# Patient Record
Sex: Female | Born: 1955
Health system: Southern US, Community
[De-identification: ages and names within clinical notes are randomized; demographics above are authoritative.]

## PROBLEM LIST (undated history)

## (undated) DIAGNOSIS — E785 Hyperlipidemia, unspecified: Secondary | ICD-10-CM

## (undated) DIAGNOSIS — M109 Gout, unspecified: Secondary | ICD-10-CM

## (undated) DIAGNOSIS — R05 Cough: Secondary | ICD-10-CM

## (undated) DIAGNOSIS — M1990 Unspecified osteoarthritis, unspecified site: Secondary | ICD-10-CM

## (undated) DIAGNOSIS — I350 Nonrheumatic aortic (valve) stenosis: Secondary | ICD-10-CM

## (undated) DIAGNOSIS — I10 Essential (primary) hypertension: Secondary | ICD-10-CM

## (undated) DIAGNOSIS — K219 Gastro-esophageal reflux disease without esophagitis: Secondary | ICD-10-CM

## (undated) DIAGNOSIS — E119 Type 2 diabetes mellitus without complications: Secondary | ICD-10-CM

## (undated) DIAGNOSIS — R059 Cough, unspecified: Secondary | ICD-10-CM

## (undated) DIAGNOSIS — I2510 Atherosclerotic heart disease of native coronary artery without angina pectoris: Secondary | ICD-10-CM

## (undated) DIAGNOSIS — M549 Dorsalgia, unspecified: Secondary | ICD-10-CM

## (undated) DIAGNOSIS — I509 Heart failure, unspecified: Secondary | ICD-10-CM

## (undated) HISTORY — PX: TOTAL HIP ARTHROPLASTY: SHX124

## (undated) HISTORY — PX: TONSILLECTOMY: SUR1361

## (undated) HISTORY — PX: CHOLECYSTECTOMY: SHX55

## (undated) HISTORY — PX: HERNIA REPAIR: SHX51

## (undated) HISTORY — PX: ABDOMINAL HYSTERECTOMY: SHX81

## (undated) HISTORY — PX: OTHER SURGICAL HISTORY: SHX169

## (undated) HISTORY — PX: APPENDECTOMY: SHX54

---

## 2001-04-05 ENCOUNTER — Ambulatory Visit (HOSPITAL_COMMUNITY): Admission: RE | Admit: 2001-04-05 | Discharge: 2001-04-05 | Payer: Self-pay | Admitting: *Deleted

## 2001-04-05 ENCOUNTER — Encounter: Payer: Self-pay | Admitting: *Deleted

## 2001-05-04 ENCOUNTER — Encounter: Payer: Self-pay | Admitting: *Deleted

## 2001-05-04 ENCOUNTER — Ambulatory Visit (HOSPITAL_COMMUNITY): Admission: RE | Admit: 2001-05-04 | Discharge: 2001-05-04 | Payer: Self-pay | Admitting: *Deleted

## 2001-05-05 ENCOUNTER — Ambulatory Visit (HOSPITAL_COMMUNITY): Admission: RE | Admit: 2001-05-05 | Discharge: 2001-05-05 | Payer: Self-pay | Admitting: *Deleted

## 2001-05-05 ENCOUNTER — Encounter: Payer: Self-pay | Admitting: *Deleted

## 2001-08-04 ENCOUNTER — Other Ambulatory Visit: Admission: RE | Admit: 2001-08-04 | Discharge: 2001-08-04 | Payer: Self-pay | Admitting: Obstetrics and Gynecology

## 2001-10-26 ENCOUNTER — Encounter (INDEPENDENT_AMBULATORY_CARE_PROVIDER_SITE_OTHER): Payer: Self-pay | Admitting: Specialist

## 2001-10-26 ENCOUNTER — Ambulatory Visit (HOSPITAL_COMMUNITY): Admission: RE | Admit: 2001-10-26 | Discharge: 2001-10-26 | Payer: Self-pay | Admitting: Gastroenterology

## 2007-03-15 ENCOUNTER — Ambulatory Visit (HOSPITAL_COMMUNITY): Admission: RE | Admit: 2007-03-15 | Discharge: 2007-03-15 | Payer: Self-pay | Admitting: Family Medicine

## 2007-03-30 ENCOUNTER — Encounter: Admission: RE | Admit: 2007-03-30 | Discharge: 2007-03-30 | Payer: Self-pay | Admitting: Sports Medicine

## 2007-04-05 ENCOUNTER — Encounter (HOSPITAL_COMMUNITY): Admission: RE | Admit: 2007-04-05 | Discharge: 2007-05-05 | Payer: Self-pay | Admitting: Sports Medicine

## 2007-05-25 ENCOUNTER — Ambulatory Visit (HOSPITAL_COMMUNITY): Admission: RE | Admit: 2007-05-25 | Discharge: 2007-05-25 | Payer: Self-pay | Admitting: Family Medicine

## 2007-12-22 HISTORY — PX: JOINT REPLACEMENT: SHX530

## 2008-04-17 ENCOUNTER — Inpatient Hospital Stay (HOSPITAL_COMMUNITY): Admission: RE | Admit: 2008-04-17 | Discharge: 2008-04-20 | Payer: Self-pay | Admitting: Orthopaedic Surgery

## 2011-05-05 NOTE — Op Note (Signed)
NAME:  Karina Green, Karina Green NO.:  [id number]   MEDICAL RECORD NO.:  [id number]          PATIENT TYPE:  INP   LOCATION:  5001                         FACILITY:  [facility]   PHYSICIAN:  Claude Manges. Whitfield, M.D.DATE OF BIRTH:  11/24/1956   DATE OF PROCEDURE:  04/17/2008  DATE OF DISCHARGE:                               OPERATIVE REPORT   PREOPERATIVE DIAGNOSIS:  Avascular necrosis, right femoral head with  osteoarthritis, right hip.   POSTOPERATIVE DIAGNOSIS:  Avascular necrosis, right femoral head with  osteoarthritis, right hip.   PROCEDURE:  Right total hip replacement.   SURGEON:  Claude Manges. Cleophas Dunker, MD   ASSISTANT:  1. Lenard Galloway. Chaney Malling, MD  2. Legrand Pitts Duffy, PA-C   ANESTHESIA:  General.   COMPLICATIONS:  None.   COMPONENTS:  DePuy AML, small stature of 13.5-mm femoral component, and  a 36-mm outer diameter hip ball with a +5 neck length.  The 100 series  54-mm outer diameter metallic acetabular cup with a +4 polyethylene  liner with a 10-degree posterior lip.   PROCEDURE:  The patient was met in the holding area preoperatively, any  questions were answered.  The right lower extremity was marked as the  appropriate operative extremity.   The patient was transferred to room 4 in the operating room.  She was  placed under general orotracheal anesthesia and placed in the lateral  decubitus position with the right side up after nursing staff inserted a  Foley catheter.   Urine was clear.   The patient was secured to the operating table with the innominate hip  system.  The right hip was then prepped with Betadine scrub and then  DuraPrep from iliac crest to below the knee.  Sterile draping was  performed.   A routine southern incision was utilized via sharp dissection carried  down through subcutaneous tissue.  There was abundant adipose tissue  which made the procedure technically difficult throughout.  Adipose  tissue was then incised with the Bovie  to limit plane to the level of  the iliotibial band.  Self-retaining retractors were inserted at each  level of incision.  The iliotibial band was then identified and then  incised along the incision.  The short external rotators were  identified, tendinous structures were tagged with 0 Ethibond suture and  they were then incised in their attachment to the posterior aspect of  the greater trochanter.  The capsule was then identified and then  incised along the femoral neck and head.  There was minimal effusion and  there was beefy red synovitis.   The head was then easily dislocated posteriorly and then exposed.  Using  a calcar cord an osteotomy was made about a fingerbreadth proximal to  the lesser trochanter.   The capsule was cleared for better visualization.  The starter hole was  then made at the piriformis fossa.  Reaming was performed sequentially  to 13-mm to accept a 13.5-mm component.   Rasping was performed sequentially using a 10, 10.5, 12, and then 13.5-  mm rasp.  Calcar reamer was used to obtain  the appropriate angle in the  calcar.   Retractor was then placed about the acetabulum.  The labrum was sharply  excised.  We had difficulty visualizing the acetabulum at first because  of the patient's size, but we were able to clear and retract the soft  tissue for better visualization.  Reaming was performed sequentially to  53 mm to accept a 54 mm odd diameter component.   We then trialed a 52-mm component and felt that we had excellent rim fit  that would completely seat, so we accordingly utilized a 54-mm outer  diameter 100 series acetabulum.  This was impacted followed by the trial  polyethylene liner, the 13.5-mm rasp and then several neck and head  sizes, and we felt that the acetabulum was little bit too vertical as we  still had some instability.  Accordingly the trial components were  removed.  We repositioned the acetabulum in more of a flexion and less   vertical position.  It was perfectly stable and completely seated.  We  then reinserted the trial components and we had a perfect stability with  a +5 neck length and a 36-mm hip ball and we felt leg lengths were  perfectly symmetrical and again we had a stable construct.   The trial components were removed.  The joint was copiously irrigated  with saline solution.  The apex eliminator was inserted followed by a +4  marathon polyethylene liner with a 10-degree posterior lip.  Wound was  again irrigated.  The 13.5-mm femoral component was then impacted on the  calcar.  We had a very nice fit and was perfectly stable.  We again  trialed a +5 neck with perfect stability.  We accordingly removed the +5  trial ball, we cleaned the Morse taper neck and then applied a 36-mm hip  ball with a +5 neck length.  The acetabulum was inspected and clear soft  tissue was retracted.  The head was then reduced in the acetabulum and  again through a full range of motion and perfectly stable and we felt  leg lengths were symmetrical.  The wound was again irrigated with saline  solution.  The capsule was closed anatomically with #1 Ethibond.  Short  external rotators were closed with similar material, iliotibial band was  closed with 0 Ethibond and #1 Vicryl.  The adipose tissue and  subcutaneous were closed in several layers of 0 and 2-0 Vicryl and skin  closed with skin clips.  Sterile bulky dressing was applied.  The  patient was then awoken and returned to the post anesthesia recovery  room in satisfactory condition.      Claude Manges. Cleophas Dunker, M.D.  Electronically Signed     PWW/MEDQ  D:  04/17/2008  T:  04/18/2008  Job:  [id number]

## 2011-05-08 NOTE — Discharge Summary (Signed)
NAME:  Karina, Green NO.:  [id number]   MEDICAL RECORD NO.:  [id number]          PATIENT TYPE:  INP   LOCATION:  5001                         FACILITY:  [facility]   PHYSICIAN:  Claude Manges. Whitfield, M.D.DATE OF BIRTH:  [birth year]   DATE OF ADMISSION:  04/17/2008  DATE OF DISCHARGE:  04/20/2008                               DISCHARGE SUMMARY   ADMISSION DIAGNOSES:  1. Avascular necrosis, right hip.  2. Hypertension.  3. Hypercholesterolemia.  4. Obesity.   DISCHARGE DIAGNOSES:  1. End-stage osteoarthritis of the right hip, status post right total      hip arthroplasty.  2. Acute blood loss anemia secondary to surgery.  3. Hypertension.  4. Hypercholesterolemia.  5. Obesity.   SURGICAL PROCEDURES:  On April 17, 2008, Karina Green underwent a right  total hip arthroplasty by Dr. Claude Manges. Whitfield, assisted by Dr. Rinaldo Ratel and Arnoldo Morale, PA-C.  She had a Pinnacle 100 series  acetabular cup size 54 mm placed with an apex hole eliminator and a  Pinnacle Marathon acetabular liner +410 degrees angle 36 mm inner  diameter and 54 mm outer diameter.  An AML small stature femoral stem  size 13.5, 155 mm length 43 mm offset and an articulate metal-on-metal  femoral head 36 mm, +5 neck length, 12/14 cone.   COMPLICATIONS:  None.   CONSULTANTS:  1. Pharmacy consult for Coumadin therapy, April 17, 2008.  2. Case management physical therapy consult, April 18, 2008.  3. Occupational therapy consult, April 19, 2008.   HISTORY OF PRESENT ILLNESS:  This 55 year old white female patient  presented to Dr. Cleophas Dunker with a 1-year history of gradual onset  progressive right hip pain.  The pain is a constant ache to sharp  sensation over the right groin with radiation into the mid thigh.  Pain  increases with walking and work and decreases with lying down.  She had  been taking Mobic and Vicodin with some relief.  The hip pops, catches,  occasionally locks, keeps her up at  night.  She has difficulty putting  on her socks and shoes.  She has failed conservative treatment and  because of that she is presenting for a right hip replacement.   HOSPITAL COURSE:  Karina Green tolerated her surgical procedure well without  immediate postoperative complications.  She was transferred to 5000.  On  postop day #1, she was afebrile, vitals were stable.  Hemoglobin was 9  and hematocrit 26.4.  Lungs were clear.  She was started on therapy per  protocol and transfused 2 units of packed red blood cells due to being  having a low blood pressure the night before.   Postop day #2, T-max was 100.4.  Hemoglobin was now 11.5, hematocrit  22.7, white count 14.2 and INR was 1.9.  She did have some edema in the  thigh and dressing was changed.  She was continued on therapy and  switched to p.o. pain meds.   On Apr 20, 2008, she was doing better.  Her pain was better controlled.  Her white count had dropped to  13.2.  Hemoglobin was stable at 11.4 with  hematocrit of 32.3, BP had improved.  She had no calf pain.  Dressing  was intact to the leg with minimal drainage.  It was felt she was moving  well enough at that time for discharge home and she was discharged home  later that day.   DISCHARGE INSTRUCTIONS:  Diet:  She can resume her regular  prehospitalization diet.   MEDICATIONS:  She may resume her home meds as follows:  1. Lisinopril 20 mg p.o. q.a.m.  2. Simvastatin 80 mg p.o. q.a.m.  3. Hydrochlorothiazide 50 mg p.o. q.a.m.  4. Hydrocodone, she is not to take at this time while on other pain      meds.  5. Flexeril 10 mg p.o. b.i.d. not to take while on other muscle      relaxers.  6. Penicillin V 500 mg p.o. q.i.d.  7. Xanax 0.5 mg p.o. q.a.m. p.r.n.   Additional meds at this time include:  1. Percocet 1-2 tablets p.o. q.4-6 h. p.r.n. for pain.  2. Coumadin 4 mg p.o. q. 6 p.m. with the dose to be adjusted by      pharmacy.  3. Robaxin 500 mg 1 p.o. q.8 h. p.r.n. for  spasms.   ACTIVITY:  She can increase activity slowly. Partial weightbearing 50%  or less on the right leg with use of walker.  She may shower after no  drainage from the wound for 2 days.  She is to have home health PT per  [facility].   WOUND CARE:  She is to keep the incision clean and dry, may shower after  no drainage for 2 days.  Notify Dr. Cleophas Dunker of any signs of infection.   FOLLOWUP:  She needs to follow up with Dr. Cleophas Dunker in our office in  approximately 7-10 days and needs to call [phone], for that  appointment.   LABORATORY DATA:  X-ray taken of her pelvis on April 28,2009, showed the  right total hip replacement without complicating features.   Hemoglobin/hematocrit ranged from 14.8 and 44.1 on the April 13, 2008,  to 9 and 26.4 on the April 18, 2008, to 11.4 and 32.3 on Apr 20, 2008.  White count went from 13.7 on the April 13, 2008, to 11.8 on the April 18, 2008, to 13.2 on the Apr 20, 2008.  Platelets remained within normal  limits.  PT and INR went from 12.4 and 0.9 on the April 13, 2008, to  24.1 and 2.1 on Apr 20, 2008.   Glucose ranged from 116 on the April 13, 2008, to 158 on the April 19, 2008.  Creatinine from 1.17 on the April 13, 2008, to 1.22 on the April 18, 2008, to 1.12 on the April 19, 2008.   Estimated GFR was 48 on the April 13, 2008, and went to 44 on the April 19, 2008.  Calcium went from 10.2 on the April 13, 2008, to 8.3 on the  April 18, 2008, and April 19, 2008.  All other laboratory studies were  within normal limits.      Legrand Pitts Duffy, P.A.      Claude Manges. Cleophas Dunker, M.D.  Electronically Signed    KED/MEDQ  D:  05/23/2008  T:  05/23/2008  Job:  [id number]

## 2011-09-15 LAB — URINALYSIS, ROUTINE W REFLEX MICROSCOPIC
Protein, ur: NEGATIVE
Urobilinogen, UA: 0.2

## 2011-09-15 LAB — CBC
HCT: 26.4 — ABNORMAL LOW
HCT: 32.7 — ABNORMAL LOW
HCT: 44.1
Hemoglobin: 14.8
Hemoglobin: 9 — ABNORMAL LOW
MCHC: 34.2
MCV: 85.5
Platelets: 214
Platelets: 317
RBC: 5.16 — ABNORMAL HIGH
RDW: 14.6
RDW: 14.8
WBC: 14.2 — ABNORMAL HIGH

## 2011-09-15 LAB — COMPREHENSIVE METABOLIC PANEL
AST: 29
Albumin: 4.3
Alkaline Phosphatase: 58
BUN: 22
GFR calc Af Amer: 59 — ABNORMAL LOW
Potassium: 4.6
Sodium: 138
Total Protein: 7.4

## 2011-09-15 LAB — BASIC METABOLIC PANEL
BUN: 15
CO2: 26
Chloride: 101
GFR calc non Af Amer: 46 — ABNORMAL LOW
Glucose, Bld: 139 — ABNORMAL HIGH
Potassium: 3.5
Potassium: 4.5
Sodium: 138

## 2011-09-15 LAB — TYPE AND SCREEN: ABO/RH(D): A POS

## 2011-09-15 LAB — URINE CULTURE

## 2011-09-15 LAB — APTT: aPTT: 25

## 2011-09-15 LAB — DIFFERENTIAL
Basophils Relative: 0
Eosinophils Relative: 2
Monocytes Absolute: 1
Monocytes Relative: 7
Neutro Abs: 7.1

## 2011-09-15 LAB — PREPARE RBC (CROSSMATCH)

## 2012-06-01 ENCOUNTER — Encounter (HOSPITAL_COMMUNITY): Payer: Self-pay | Admitting: Emergency Medicine

## 2012-06-01 ENCOUNTER — Emergency Department (HOSPITAL_COMMUNITY)
Admission: EM | Admit: 2012-06-01 | Discharge: 2012-06-02 | Disposition: A | Discharge status: Home / Self Care | Payer: Self-pay | Attending: Emergency Medicine | Admitting: Emergency Medicine

## 2012-06-01 ENCOUNTER — Emergency Department (HOSPITAL_COMMUNITY): Payer: Self-pay

## 2012-06-01 DIAGNOSIS — M129 Arthropathy, unspecified: Secondary | ICD-10-CM | POA: Insufficient documentation

## 2012-06-01 DIAGNOSIS — M545 Low back pain, unspecified: Secondary | ICD-10-CM | POA: Insufficient documentation

## 2012-06-01 DIAGNOSIS — N390 Urinary tract infection, site not specified: Secondary | ICD-10-CM

## 2012-06-01 DIAGNOSIS — M549 Dorsalgia, unspecified: Secondary | ICD-10-CM

## 2012-06-01 DIAGNOSIS — I10 Essential (primary) hypertension: Secondary | ICD-10-CM | POA: Insufficient documentation

## 2012-06-01 HISTORY — DX: Dorsalgia, unspecified: M54.9

## 2012-06-01 HISTORY — DX: Unspecified osteoarthritis, unspecified site: M19.90

## 2012-06-01 HISTORY — DX: Essential (primary) hypertension: I10

## 2012-06-01 LAB — URINALYSIS, ROUTINE W REFLEX MICROSCOPIC
Glucose, UA: NEGATIVE mg/dL
Hgb urine dipstick: NEGATIVE
Protein, ur: NEGATIVE mg/dL
Specific Gravity, Urine: 1.025 (ref 1.005–1.030)
pH: 6 (ref 5.0–8.0)

## 2012-06-01 LAB — URINE MICROSCOPIC-ADD ON

## 2012-06-01 MED ORDER — HYDROCODONE-ACETAMINOPHEN 5-325 MG PO TABS
2.0000 | ORAL_TABLET | Freq: Once | ORAL | Status: AC
  Administered 2012-06-01: 2 via ORAL
  Filled 2012-06-01: qty 2

## 2012-06-01 MED ORDER — NITROFURANTOIN MACROCRYSTAL 100 MG PO CAPS: 100.0000 mg | ORAL_CAPSULE | Freq: Two times a day (BID) | ORAL | Status: AC

## 2012-06-01 MED ORDER — CYCLOBENZAPRINE HCL 10 MG PO TABS
10.0000 mg | ORAL_TABLET | Freq: Once | ORAL | Status: AC
  Administered 2012-06-01: 10 mg via ORAL
  Filled 2012-06-01: qty 1

## 2012-06-01 MED ORDER — HYDROCODONE-ACETAMINOPHEN 5-325 MG PO TABS: 1.0000 | ORAL_TABLET | ORAL | Status: AC | PRN

## 2012-06-01 MED ORDER — NITROFURANTOIN MACROCRYSTAL 100 MG PO CAPS
100.0000 mg | ORAL_CAPSULE | Freq: Once | ORAL | Status: AC
  Administered 2012-06-01: 100 mg via ORAL
  Filled 2012-06-01: qty 1

## 2012-06-01 MED ORDER — CYCLOBENZAPRINE HCL 10 MG PO TABS: 10.0000 mg | ORAL_TABLET | Freq: Two times a day (BID) | ORAL | Status: AC | PRN

## 2012-06-01 NOTE — ED Notes (Signed)
Patient states "I have done something to my back but I don't know what. 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

## 2012-06-01 NOTE — ED Provider Notes (Signed)
History   This chart was scribed for [employer]. Colon Branch, MD by Brooks Sailors. The patient was seen in room APA04/APA04. Patient's care was started at 2103.   CSN: [id number]  Arrival date & time 06/01/12  2103   First MD Initiated Contact with Patient 06/01/12 2302      Chief Complaint  Patient presents with  . Back Pain    (Consider location/radiation/quality/duration/timing/severity/associated sxs/prior treatment) HPI  Karina Green is a 56 y.o. female who presents to the Emergency Department complaining of right sided back pain. Patient says she has a history of similar pain and came in after it became unbearable. Patient says the pain feels like a cramp or a spasm. Pain does not radiate. Patient took Advil without much relief. 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     Past Medical History  Diagnosis Date  . Hypertension   . Back pain   . Arthritis     Past Surgical History  Procedure Date  . Tonsillectomy   . Bladder tack   . Total hip arthroplasty   . Hernia repair   . Cholecystectomy   . Abdominal hysterectomy   . Appendectomy     History reviewed. No pertinent family history.  History  Substance Use Topics  . Smoking status: Never Smoker   . Smokeless tobacco: Not on file  . Alcohol Use: No    OB History    Grav Para Term Preterm Abortions TAB SAB Ect Mult Living                  Review of Systems  Constitutional: Negative for fever.       10 Systems reviewed and are negative for acute change except as noted in the HPI.  HENT: Negative for congestion.   Eyes: Negative for discharge and redness.  Respiratory: Negative for cough and shortness of breath.   Cardiovascular: Negative for chest pain.  Gastrointestinal: Negative for vomiting and abdominal pain.  Musculoskeletal: Positive for back pain.  Skin: Negative for rash.  Neurological: Negative for syncope, numbness and headaches.    Psychiatric/Behavioral:       No behavior change.  All other systems reviewed and are negative.    Allergies  Review of patient's allergies indicates no known allergies.  Home Medications   Current Outpatient Rx  Name Route Sig Dispense Refill  . ALPRAZOLAM 0.5 MG PO TABS Oral Take 0.5 mg by mouth daily as needed.    Marland Kitchen DIPHENHYDRAMINE HCL 25 MG PO CAPS Oral Take 25 mg by mouth at bedtime as needed. For allergies    . HYDROCHLOROTHIAZIDE 25 MG PO TABS Oral Take 25 mg by mouth daily.    . IBUPROFEN 200 MG PO TABS Oral Take 600 mg by mouth as needed. For pain    . LISINOPRIL 20 MG PO TABS Oral Take 20 mg by mouth daily.    . MELOXICAM 15 MG PO TABS Oral Take 15 mg by mouth daily.      BP 125/53  Pulse 76  Temp 97.8 F (36.6 C) (Oral)  Resp 18  Ht 5\' 2"  (1.575 m)  Wt 227 lb (102.967 kg)  BMI 41.52 kg/m2  SpO2 100%  Physical Exam  Nursing note and vitals reviewed. Constitutional: She is oriented to person, place, and time. She appears well-developed and well-nourished.  HENT:  Head: Normocephalic and atraumatic.  Eyes: Conjunctivae and EOM are normal. Pupils are equal, round,  and reactive to light.  Neck: Normal range of motion. Neck supple.  Cardiovascular: Normal rate and regular rhythm.   Pulmonary/Chest: Effort normal and breath sounds normal.  Abdominal: Soft. Bowel sounds are normal.  Genitourinary:       No cva tenderness  Musculoskeletal: Normal range of motion. She exhibits tenderness.       Right paraspinal muscles in the lower thoracic and upper lumbar area are tight with spasms  Neurological: She is alert and oriented to person, place, and time.  Skin: Skin is warm and dry.  Psychiatric: She has a normal mood and affect.    ED Course  Procedures (including critical care time) DIAGNOSTIC STUDIES: Oxygen Saturation is 100% on room air, normal by my interpretation.    COORDINATION OF CARE: 1117 PM Patient informed of current plan for treatment and  evaluation and agrees with plan at this time.  Results for orders placed during the hospital encounter of 06/01/12  URINALYSIS, ROUTINE W REFLEX MICROSCOPIC      Component Value Range   Color, Urine AMBER (*) YELLOW   APPearance CLEAR  CLEAR   Specific Gravity, Urine 1.025  1.005 - 1.030   pH 6.0  5.0 - 8.0   Glucose, UA NEGATIVE  NEGATIVE mg/dL   Hgb urine dipstick NEGATIVE  NEGATIVE   Bilirubin Urine NEGATIVE  NEGATIVE   Ketones, ur NEGATIVE  NEGATIVE mg/dL   Protein, ur NEGATIVE  NEGATIVE mg/dL   Urobilinogen, UA 0.2  0.0 - 1.0 mg/dL   Nitrite NEGATIVE  NEGATIVE   Leukocytes, UA MODERATE (*) NEGATIVE  URINE MICROSCOPIC-ADD ON      Component Value Range   Squamous Epithelial / LPF MANY (*) RARE   WBC, UA 21-50  <3 WBC/hpf   RBC / HPF 0-2  <3 RBC/hpf   Bacteria, UA MANY (*) RARE       Dg Lumbar Spine Complete  06/01/2012  *RADIOLOGY REPORT*  Clinical Data: Low back pain  LUMBAR SPINE - COMPLETE 4+ VIEW  Comparison: 03/30/2007 MRI  Findings: Mild multilevel degenerative changes. This includes disc height loss at L4-5 and lower lumbar facet arthropathy. No acute fracture or dislocation.  No aggressive osseous lesion. Atherosclerotic vascular calcifications.  IMPRESSION: Mild multilevel degenerative change without acute osseous abnormality.  Original Report Authenticated By: Waneta Martins, M.D.        MDM  Patient with right lower back pain since yesterday. 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 She oes not recall injuring herself.x-ray lumbar spine does not show any acute abnormalities. Urine analysis shows evidence of infection. Initiated antibiotic therapy. Patient was given a muscle relaxant and analgesic. Pt stable in ED with no significant deterioration in condition.The patient appears reasonably screened and/or stabilized for discharge and I doubt any other medical condition or other [facility] requiring further screening,  evaluation, or treatment in the ED at this time prior to discharge.  I personally performed the services described in this documentation, which was scribed in my presence. The recorded information has been reviewed and considered.   MDM Reviewed: nursing note and vitals Interpretation: labs and x-ray         Nicoletta Dress. Colon Branch, MD 06/01/12 2355

## 2012-06-02 NOTE — Discharge Instructions (Signed)
Apply heat to the area for comfort. Continue to use ibuprofen. Use the muscle relaxant and stronger pain medicine as needed. Stretch the back as you were shown. You have a urinary tract infection. Take all of the antibiotic. Drink lots of fluids.   Back Pain, Adult Back pain is very common. The pain often gets better over time. The cause of back pain is usually not dangerous. Most people can learn to manage their back pain on their own.  HOME CARE   Stay active. Start with short walks on flat ground if you can. Try to walk farther each day.   Do not sit, drive, or stand in one place for more than 30 minutes. Do not stay in bed.   Do not avoid exercise or work. Activity can help your back heal faster.   Be careful when you bend or lift an object. Bend at your knees, keep the object close to you, and do not twist.   Sleep on a firm mattress. Lie on your side, and bend your knees. If you lie on your back, put a pillow under your knees.   Only take medicines as told by your doctor.   Put ice on the injured area.   Put ice in a plastic bag.   Place a towel between your skin and the bag.   Leave the ice on for 15 to 20 minutes, 3 to 4 times a day for the first 2 to 3 days. After that, you can switch between ice and heat packs.   Ask your doctor about back exercises or massage.   Avoid feeling anxious or stressed. Find good ways to deal with stress, such as exercise.  GET HELP RIGHT AWAY IF:   Your pain does not go away with rest or medicine.   Your pain does not go away in 1 week.   You have new problems.   You do not feel well.   The pain spreads into your legs.   You cannot control when you poop (bowel movement) or pee (urinate).   Your arms or legs feel weak or lose feeling (numbness).   You feel sick to your stomach (nauseous) or throw up (vomit).   You have belly (abdominal) pain.   You feel like you may pass out (faint).  MAKE SURE YOU:   Understand these  instructions.   Will watch your condition.   Will get help right away if you are not doing well or get worse.  Document Released: 05/25/2008 Document Revised: 11/26/2011 Document Reviewed: 04/27/2011 [facility] Patient Information 2012 [place], [place].

## 2012-06-03 LAB — URINE CULTURE: Culture  Setup Time: 201306122313

## 2012-09-18 ENCOUNTER — Emergency Department (HOSPITAL_COMMUNITY): Payer: Self-pay

## 2012-09-18 ENCOUNTER — Emergency Department (HOSPITAL_COMMUNITY)
Admission: EM | Admit: 2012-09-18 | Discharge: 2012-09-18 | Disposition: A | Discharge status: Home / Self Care | Payer: Self-pay | Attending: Emergency Medicine | Admitting: Emergency Medicine

## 2012-09-18 ENCOUNTER — Encounter (HOSPITAL_COMMUNITY): Payer: Self-pay | Admitting: *Deleted

## 2012-09-18 DIAGNOSIS — M25476 Effusion, unspecified foot: Secondary | ICD-10-CM | POA: Insufficient documentation

## 2012-09-18 DIAGNOSIS — Z9089 Acquired absence of other organs: Secondary | ICD-10-CM | POA: Insufficient documentation

## 2012-09-18 DIAGNOSIS — F411 Generalized anxiety disorder: Secondary | ICD-10-CM | POA: Insufficient documentation

## 2012-09-18 DIAGNOSIS — M109 Gout, unspecified: Secondary | ICD-10-CM | POA: Insufficient documentation

## 2012-09-18 DIAGNOSIS — M25473 Effusion, unspecified ankle: Secondary | ICD-10-CM | POA: Insufficient documentation

## 2012-09-18 DIAGNOSIS — Z96649 Presence of unspecified artificial hip joint: Secondary | ICD-10-CM | POA: Insufficient documentation

## 2012-09-18 DIAGNOSIS — M79609 Pain in unspecified limb: Secondary | ICD-10-CM | POA: Insufficient documentation

## 2012-09-18 DIAGNOSIS — I10 Essential (primary) hypertension: Secondary | ICD-10-CM | POA: Insufficient documentation

## 2012-09-18 DIAGNOSIS — L0390 Cellulitis, unspecified: Secondary | ICD-10-CM

## 2012-09-18 DIAGNOSIS — Z90710 Acquired absence of both cervix and uterus: Secondary | ICD-10-CM | POA: Insufficient documentation

## 2012-09-18 DIAGNOSIS — M129 Arthropathy, unspecified: Secondary | ICD-10-CM | POA: Insufficient documentation

## 2012-09-18 LAB — CBC WITH DIFFERENTIAL/PLATELET
Hemoglobin: 11.7 g/dL — ABNORMAL LOW (ref 12.0–15.0)
Lymphs Abs: 3.6 10*3/uL (ref 0.7–4.0)
MCH: 28.3 pg (ref 26.0–34.0)
Monocytes Relative: 7 % (ref 3–12)
Neutro Abs: 9 10*3/uL — ABNORMAL HIGH (ref 1.7–7.7)
Neutrophils Relative %: 65 % (ref 43–77)
RBC: 4.13 MIL/uL (ref 3.87–5.11)

## 2012-09-18 MED ORDER — SULFAMETHOXAZOLE-TRIMETHOPRIM 800-160 MG PO TABS: 1.0000 | ORAL_TABLET | Freq: Two times a day (BID) | ORAL | Status: DC

## 2012-09-18 MED ORDER — OXYCODONE-ACETAMINOPHEN 5-325 MG PO TABS
1.0000 | ORAL_TABLET | Freq: Once | ORAL | Status: AC
  Administered 2012-09-18: 1 via ORAL
  Filled 2012-09-18: qty 1

## 2012-09-18 MED ORDER — CEFTRIAXONE SODIUM 1 G IJ SOLR
1.0000 g | Freq: Once | INTRAMUSCULAR | Status: AC
  Administered 2012-09-18: 1 g via INTRAMUSCULAR
  Filled 2012-09-18: qty 10

## 2012-09-18 MED ORDER — LIDOCAINE HCL (PF) 1 % IJ SOLN
INTRAMUSCULAR | Status: AC
  Administered 2012-09-18: 14:00:00
  Filled 2012-09-18: qty 5

## 2012-09-18 MED ORDER — OXYCODONE-ACETAMINOPHEN 5-325 MG PO TABS: 1.0000 | ORAL_TABLET | ORAL | Status: AC | PRN

## 2012-09-18 NOTE — ED Notes (Signed)
Pain in L foot began approximately 2 weeks ago and has progressively worsened until this AM when patient couldn't put foot on floor.  She has been using Advil for pain.  No known injury.  Slight swelling at [facility].  Patient states she has intermittent swelling of L foot.

## 2012-09-18 NOTE — ED Provider Notes (Signed)
History     CSN: [id number]  Arrival date & time 09/18/12  1156   First MD Initiated Contact with Patient 09/18/12 1210      Chief Complaint  Patient presents with  . Foot Pain    (Consider location/radiation/quality/duration/timing/severity/associated sxs/prior treatment) HPI Comments: Patient complains of left foot and ankle pain for 2 weeks. She states the pain has been progressing. This morning she was unable to bear weight to her left foot due to pain. She also reports mild swelling and redness to the ankle. She states she has history of gout to the states this pain does not feel similar to previous gout attacks. She denies radiation of pain, numbness, weakness, fever, or chills. She also denies any known injury or open wound.   Patient is a 56 y.o. female presenting with lower extremity pain. The history is provided by the patient.  Foot Pain This is a new problem. The current episode started 1 to 4 weeks ago. The problem occurs constantly. The problem has been gradually worsening. Associated symptoms include arthralgias and joint swelling. Pertinent negatives include no chills, fever, headaches, neck pain, numbness, rash, sore throat, swollen glands, vomiting or weakness. The symptoms are aggravated by standing, walking and bending. She has tried nothing for the symptoms. The treatment provided no relief.    Past Medical History  Diagnosis Date  . Hypertension   . Back pain   . Arthritis     Past Surgical History  Procedure Date  . Tonsillectomy   . Bladder tack   . Total hip arthroplasty   . Hernia repair   . Cholecystectomy   . Abdominal hysterectomy   . Appendectomy     History reviewed. No pertinent family history.  History  Substance Use Topics  . Smoking status: Never Smoker   . Smokeless tobacco: Not on file  . Alcohol Use: No    OB History    Grav Para Term Preterm Abortions TAB SAB Ect Mult Living                  Review of Systems    Constitutional: Negative for fever, chills, activity change and appetite change.  HENT: Negative for sore throat and neck pain.   Gastrointestinal: Negative for vomiting.  Genitourinary: Negative for dysuria and difficulty urinating.  Musculoskeletal: Positive for joint swelling, arthralgias and gait problem.  Skin: Positive for color change. Negative for rash and wound.  Neurological: Negative for dizziness, weakness, numbness and headaches.  All other systems reviewed and are negative.    Allergies  Review of patient's allergies indicates no known allergies.  Home Medications   Current Outpatient Rx  Name Route Sig Dispense Refill  . ALPRAZOLAM 0.5 MG PO TABS Oral Take 0.5 mg by mouth daily as needed. Panic attacks/anxiety.    . CYCLOBENZAPRINE HCL 10 MG PO TABS Oral Take 10 mg by mouth daily as needed. Back spasms.    Marland Kitchen DIPHENHYDRAMINE HCL 25 MG PO CAPS Oral Take 25 mg by mouth at bedtime as needed. For allergies    . HYDROCHLOROTHIAZIDE 25 MG PO TABS Oral Take 25 mg by mouth daily.    . IBUPROFEN 200 MG PO TABS Oral Take 400 mg by mouth 2 (two) times daily as needed. For pain    . LISINOPRIL 20 MG PO TABS Oral Take 20 mg by mouth daily.    . MELOXICAM 15 MG PO TABS Oral Take 15 mg by mouth daily.      BP 140/68  Pulse 77  Temp 98.3 F (36.8 C) (Oral)  Resp 20  Ht 5\' 3"  (1.6 m)  Wt 224 lb (101.606 kg)  BMI 39.68 kg/m2  SpO2 98%  Physical Exam  Nursing note and vitals reviewed. Constitutional: She is oriented to person, place, and time. She appears well-developed and well-nourished. No distress.  HENT:  Head: Normocephalic and atraumatic.  Cardiovascular: Normal rate, regular rhythm, normal heart sounds and intact distal pulses.   Pulmonary/Chest: Effort normal and breath sounds normal.  Musculoskeletal: She exhibits edema and tenderness.       Left ankle: She exhibits swelling. She exhibits normal range of motion, no ecchymosis, no deformity, no laceration and normal  pulse. tenderness. Lateral malleolus tenderness found. No posterior TFL, no head of 5th metatarsal and no proximal fibula tenderness found. Achilles tendon normal.       Feet:       Left lateral ankle is tender to palpation, mild soft tissue  swelling is present, mild erythema also present.  ROM is preserved.  DP pulse is brisk, distal sensation intact.  No open wounds, lesions of the skin, or lymphangitis.  Neurological: She is alert and oriented to person, place, and time. She exhibits normal muscle tone. Coordination normal.  Skin: Skin is warm and dry.    ED Course  Procedures (including critical care time)  Labs Reviewed  CBC WITH DIFFERENTIAL - Abnormal; Notable for the following:    WBC 13.8 (*)     Hemoglobin 11.7 (*)     HCT 35.2 (*)     Neutro Abs 9.0 (*)     All other components within normal limits  URIC ACID - Abnormal; Notable for the following:    Uric Acid, Serum 9.9 (*)     All other components within normal limits   Dg Ankle Complete Right  09/18/2012  *RADIOLOGY REPORT*  Clinical Data: Foot/ankle pain and swelling, reported history of gout  RIGHT ANKLE - COMPLETE 3+ VIEW  Comparison: None.  Findings: No fracture or dislocation is seen.  The ankle mortise is intact.  The base of the fifth metatarsal is unremarkable.  Small plantar and posterior calcaneal enthesophytes.  Mild diffuse soft tissue swelling.  Stranding/soft tissue infiltration in Kager's fat pad on the lateral view, nonspecific.  IMPRESSION: No fracture or dislocation is seen.  Mild soft tissue swelling.   Original Report Authenticated By: Charline Bills, M.D.    Dg Foot Complete Right  09/18/2012  *RADIOLOGY REPORT*  Clinical Data: Foot pain  RIGHT FOOT COMPLETE - 3+ VIEW  Comparison: None.  Findings: No fracture or dislocation is seen.  The joint spaces are essentially preserved, noting mild degenerative changes of the fifth PIP joint.  Small plantar and posterior calcaneal enthesophytes.  Visualized soft  tissues are notable for mild infiltration of Kager's fat pad.  IMPRESSION: No fracture or dislocation is seen.   Original Report Authenticated By: Charline Bills, M.D.         MDM    Mild erythema to the dorsal foot and ankle.  Likely cellulitis.  I will prescribe NSAID, pain medication and antibiotic.  I have given pt close f/u instructions, to see her PMD or return here in 2-3 days if the sx's are worsening.     The patient appears reasonably screened and/or stabilized for discharge and I doubt any other medical condition or other [facility] requiring further screening, evaluation, or treatment in the ED at this time prior to discharge.   Prescribed:  allinopurinol Percocet  Bactrim DS  Cherre Kothari L. [place], [place] 09/19/12 1802

## 2012-09-18 NOTE — ED Notes (Signed)
Patient with no complaints at this time. Respirations even and unlabored. Skin warm/dry. Discharge instructions reviewed with patient at this time. Patient given opportunity to voice concerns/ask questions. Patient discharged at this time and left Emergency Department with steady gait.   

## 2012-09-22 NOTE — ED Provider Notes (Signed)
Medical screening examination/treatment/procedure(s) were performed by non-physician practitioner and as supervising physician I was immediately available for consultation/collaboration.  Raeford Razor, MD 09/22/12 1048

## 2013-10-06 ENCOUNTER — Emergency Department (HOSPITAL_COMMUNITY)
Admission: EM | Admit: 2013-10-06 | Discharge: 2013-10-06 | Disposition: A | Discharge status: Home / Self Care | Payer: Self-pay | Attending: Emergency Medicine | Admitting: Emergency Medicine

## 2013-10-06 ENCOUNTER — Encounter (HOSPITAL_COMMUNITY): Payer: Self-pay | Admitting: Emergency Medicine

## 2013-10-06 DIAGNOSIS — M129 Arthropathy, unspecified: Secondary | ICD-10-CM | POA: Insufficient documentation

## 2013-10-06 DIAGNOSIS — Z8739 Personal history of other diseases of the musculoskeletal system and connective tissue: Secondary | ICD-10-CM

## 2013-10-06 DIAGNOSIS — M25539 Pain in unspecified wrist: Secondary | ICD-10-CM | POA: Insufficient documentation

## 2013-10-06 DIAGNOSIS — Z791 Long term (current) use of non-steroidal anti-inflammatories (NSAID): Secondary | ICD-10-CM | POA: Insufficient documentation

## 2013-10-06 DIAGNOSIS — Z8639 Personal history of other endocrine, nutritional and metabolic disease: Secondary | ICD-10-CM | POA: Insufficient documentation

## 2013-10-06 DIAGNOSIS — Z862 Personal history of diseases of the blood and blood-forming organs and certain disorders involving the immune mechanism: Secondary | ICD-10-CM | POA: Insufficient documentation

## 2013-10-06 DIAGNOSIS — Z792 Long term (current) use of antibiotics: Secondary | ICD-10-CM | POA: Insufficient documentation

## 2013-10-06 DIAGNOSIS — M25531 Pain in right wrist: Secondary | ICD-10-CM

## 2013-10-06 DIAGNOSIS — Z79899 Other long term (current) drug therapy: Secondary | ICD-10-CM | POA: Insufficient documentation

## 2013-10-06 DIAGNOSIS — I10 Essential (primary) hypertension: Secondary | ICD-10-CM | POA: Insufficient documentation

## 2013-10-06 MED ORDER — INDOMETHACIN 25 MG PO CAPS: 25.0000 mg | ORAL_CAPSULE | Freq: Three times a day (TID) | ORAL | Status: DC | PRN

## 2013-10-06 MED ORDER — HYDROCODONE-ACETAMINOPHEN 5-325 MG PO TABS: 2.0000 | ORAL_TABLET | ORAL | Status: DC | PRN

## 2013-10-06 NOTE — ED Notes (Signed)
Patient c/o right hand pain and swelling.  Patient states hand was sore yesterday, but pain got worse during the night.  Patient denies injury to hand.

## 2013-10-06 NOTE — ED Provider Notes (Signed)
CSN: [id number]     Arrival date & time 10/06/13  0301 History   First MD Initiated Contact with Patient 10/06/13 0354     Chief Complaint  Patient presents with  . Hand Pain   (Consider location/radiation/quality/duration/timing/severity/associated sxs/prior Treatment) HPI Comments: Patient is a 57 year old female with past medical history of hypertension and gout. She presents to the emergency department with complaints of pain in her right wrist that started yesterday. She denies any injury or trauma and denies having perform any repetitive motions that may have aggravated this. She denies any fevers or chills.  Patient is a 57 y.o. female presenting with hand pain. The history is provided by the patient.  Hand Pain This is a new problem. The current episode started yesterday. The problem occurs constantly. The problem has been gradually worsening. Exacerbated by: Movement. The symptoms are relieved by rest. She has tried nothing for the symptoms. The treatment provided no relief.    Past Medical History  Diagnosis Date  . Hypertension   . Back pain   . Arthritis    Past Surgical History  Procedure Laterality Date  . Tonsillectomy    . Bladder tack    . Total hip arthroplasty    . Hernia repair    . Cholecystectomy    . Abdominal hysterectomy    . Appendectomy     No family history on file. History  Substance Use Topics  . Smoking status: Never Smoker   . Smokeless tobacco: Not on file  . Alcohol Use: No   OB History   Grav Para Term Preterm Abortions TAB SAB Ect Mult Living                 Review of Systems  All other systems reviewed and are negative.    Allergies  Review of patient's allergies indicates no known allergies.  Home Medications   Current Outpatient Rx  Name  Route  Sig  Dispense  Refill  . ALPRAZolam (XANAX) 0.5 MG tablet   Oral   Take 0.5 mg by mouth daily as needed. Panic attacks/anxiety.         . cyclobenzaprine (FLEXERIL) 10 MG  tablet   Oral   Take 10 mg by mouth daily as needed. Back spasms.         . diphenhydrAMINE (BENADRYL) 25 mg capsule   Oral   Take 25 mg by mouth at bedtime as needed. For allergies         . hydrochlorothiazide (HYDRODIURIL) 25 MG tablet   Oral   Take 25 mg by mouth daily.         Marland Kitchen ibuprofen (ADVIL,MOTRIN) 200 MG tablet   Oral   Take 400 mg by mouth 2 (two) times daily as needed. For pain         . lisinopril (PRINIVIL,ZESTRIL) 20 MG tablet   Oral   Take 20 mg by mouth daily.         . meloxicam (MOBIC) 15 MG tablet   Oral   Take 15 mg by mouth daily.         Marland Kitchen sulfamethoxazole-trimethoprim (SEPTRA DS) 800-160 MG per tablet   Oral   Take 1 tablet by mouth 2 (two) times daily.   28 tablet   0    BP 169/80  Pulse 71  Temp(Src) 98.4 F (36.9 C) (Oral)  Resp 18  Ht 5\' 3"  (1.6 m)  Wt 230 lb (104.327 kg)  BMI 40.75 kg/m2  SpO2  100% Physical Exam  Nursing note and vitals reviewed. Constitutional: She is oriented to person, place, and time. She appears well-developed and well-nourished. No distress.  HENT:  Head: Normocephalic and atraumatic.  Mouth/Throat: Oropharynx is clear and moist.  Neck: Normal range of motion. Neck supple.  Musculoskeletal: Normal range of motion.  The right wrist is noted to have mild swelling. There is no redness but there is slight warmth. There is pain with range of motion of the wrist but distal pulses, cap refill, sensation, and motor are all intact.  Neurological: She is alert and oriented to person, place, and time.  Skin: Skin is warm and dry. She is not diaphoretic. There is erythema.    ED Course  Procedures (including critical care time) Labs Review Labs Reviewed - No data to display Imaging Review No results found.  EKG Interpretation   None       MDM  No diagnosis found. She reports having had gout in the past and does feel as though this is a similar pain. Without any injury or trauma do not feel as  though an x-ray is indicated. We have agreed to treat with indomethacin and pain medication. If she's not improving in the next several days she is to return to the emergency department or see her primary Dr. to be evaluated. I do not feel as though this is a septic wrist.    Geoffery Lyons, MD 10/06/13 [phone]

## 2014-04-12 ENCOUNTER — Other Ambulatory Visit (HOSPITAL_COMMUNITY): Payer: Self-pay | Admitting: Family Medicine

## 2014-04-12 DIAGNOSIS — M5430 Sciatica, unspecified side: Secondary | ICD-10-CM

## 2014-04-16 ENCOUNTER — Ambulatory Visit (HOSPITAL_COMMUNITY): Payer: No Typology Code available for payment source

## 2014-04-19 ENCOUNTER — Ambulatory Visit (HOSPITAL_COMMUNITY)
Admission: RE | Admit: 2014-04-19 | Discharge: 2014-04-19 | Disposition: A | Discharge status: Home / Self Care | Payer: No Typology Code available for payment source | Source: Ambulatory Visit | Attending: Family Medicine | Admitting: Family Medicine

## 2014-04-19 ENCOUNTER — Encounter (HOSPITAL_COMMUNITY): Payer: Self-pay

## 2014-04-19 DIAGNOSIS — M47817 Spondylosis without myelopathy or radiculopathy, lumbosacral region: Secondary | ICD-10-CM | POA: Insufficient documentation

## 2014-04-19 DIAGNOSIS — M545 Low back pain, unspecified: Secondary | ICD-10-CM | POA: Insufficient documentation

## 2014-04-19 DIAGNOSIS — M5430 Sciatica, unspecified side: Secondary | ICD-10-CM

## 2014-07-09 ENCOUNTER — Emergency Department (HOSPITAL_COMMUNITY)
Admission: EM | Admit: 2014-07-09 | Discharge: 2014-07-09 | Disposition: A | Discharge status: Home / Self Care | Payer: No Typology Code available for payment source | Attending: Emergency Medicine | Admitting: Emergency Medicine

## 2014-07-09 ENCOUNTER — Encounter (HOSPITAL_COMMUNITY): Payer: Self-pay | Admitting: Emergency Medicine

## 2014-07-09 DIAGNOSIS — M25579 Pain in unspecified ankle and joints of unspecified foot: Secondary | ICD-10-CM | POA: Diagnosis present

## 2014-07-09 DIAGNOSIS — I10 Essential (primary) hypertension: Secondary | ICD-10-CM | POA: Diagnosis not present

## 2014-07-09 DIAGNOSIS — M25572 Pain in left ankle and joints of left foot: Secondary | ICD-10-CM

## 2014-07-09 DIAGNOSIS — M7989 Other specified soft tissue disorders: Secondary | ICD-10-CM | POA: Diagnosis not present

## 2014-07-09 DIAGNOSIS — Z79899 Other long term (current) drug therapy: Secondary | ICD-10-CM | POA: Insufficient documentation

## 2014-07-09 DIAGNOSIS — M109 Gout, unspecified: Secondary | ICD-10-CM | POA: Insufficient documentation

## 2014-07-09 MED ORDER — ACETAMINOPHEN-CODEINE #3 300-30 MG PO TABS: 1.0000 | ORAL_TABLET | Freq: Four times a day (QID) | ORAL | Status: DC | PRN

## 2014-07-09 NOTE — ED Notes (Signed)
Pt started experiencing pain in right foot Friday. Today noticed swelling. Pain has gotten progressively worse. DP2 cap refill brisk. Foot warm,

## 2014-07-09 NOTE — Discharge Instructions (Signed)
Please elevate your ankle above your waist. Please use Tylenol for mild pain, use Tylenol codeine for more severe pain. Tylenol codeine may cause drowsiness, please use with caution. Please see the orthopedic specialist listed above if not improving. Ankle Pain Ankle pain is a common symptom. The bones, cartilage, tendons, and muscles of the ankle joint perform a lot of work each day. The ankle joint holds your body weight and allows you to move around. Ankle pain can occur on either side or back of 1 or both ankles. Ankle pain may be sharp and burning or dull and aching. There may be tenderness, stiffness, redness, or warmth around the ankle. The pain occurs more often when a person walks or puts pressure on the ankle. CAUSES  There are many reasons ankle pain can develop. It is important to work with your caregiver to identify the cause since many conditions can impact the bones, cartilage, muscles, and tendons. Causes for ankle pain include:  Injury, including a break (fracture), sprain, or strain often due to a fall, sports, or a high-impact activity.  Swelling (inflammation) of a tendon (tendonitis).  Achilles tendon rupture.  Ankle instability after repeated sprains and strains.  Poor foot alignment.  Pressure on a nerve (tarsal tunnel syndrome).  Arthritis in the ankle or the lining of the ankle.  Crystal formation in the ankle (gout or pseudogout). DIAGNOSIS  A diagnosis is based on your medical history, your symptoms, results of your physical exam, and results of diagnostic tests. Diagnostic tests may include X-ray exams or a computerized magnetic scan (magnetic resonance imaging, MRI). TREATMENT  Treatment will depend on the cause of your ankle pain and may include:  Keeping pressure off the ankle and limiting activities.  Using crutches or other walking support (a cane or brace).  Using rest, ice, compression, and elevation.  Participating in physical therapy or home  exercises.  Wearing shoe inserts or special shoes.  Losing weight.  Taking medications to reduce pain or swelling or receiving an injection.  Undergoing surgery. HOME CARE INSTRUCTIONS   Only take over-the-counter or prescription medicines for pain, discomfort, or fever as directed by your caregiver.  Put ice on the injured area.  Put ice in a plastic bag.  Place a towel between your skin and the bag.  Leave the ice on for 15-20 minutes at a time, 03-04 times a day.  Keep your leg raised (elevated) when possible to lessen swelling.  Avoid activities that cause ankle pain.  Follow specific exercises as directed by your caregiver.  Record how often you have ankle pain, the location of the pain, and what it feels like. This information may be helpful to you and your caregiver.  Ask your caregiver about returning to work or sports and whether you should drive.  Follow up with your caregiver for further examination, therapy, or testing as directed. SEEK MEDICAL CARE IF:   Pain or swelling continues or worsens beyond 1 week.  You have an oral temperature above 102 F (38.9 C).  You are feeling unwell or have chills.  You are having an increasingly difficult time with walking.  You have loss of sensation or other new symptoms.  You have questions or concerns. MAKE SURE YOU:   Understand these instructions.  Will watch your condition.  Will get help right away if you are not doing well or get worse. Document Released: 05/27/2010 Document Revised: 02/29/2012 Document Reviewed: 05/27/2010 [facility] Patient Information 2015 [employer], [place]. This information is not  intended to replace advice given to you by your health care provider. Make sure you discuss any questions you have with your health care provider. ° °

## 2014-07-09 NOTE — ED Provider Notes (Signed)
CSN: [id number]     Arrival date & time 07/09/14  1635 History   First MD Initiated Contact with Patient 07/09/14 1702    This chart was scribed for non-physician practitioner Lily Kocher, PA-C working with Merryl Hacker, MD, by Thea Alken, ED Scribe. This patient was seen in room APFT24/APFT24 and the patient's care was started at 5:18 PM. Chief Complaint  Patient presents with  . Foot Pain   Patient is a 58 y.o. female presenting with lower extremity pain. The history is provided by the patient.  Foot Pain This is a new problem. The current episode started more than 2 days ago. The problem occurs constantly. The problem has been gradually worsening. Pertinent negatives include no chest pain, no abdominal pain, no headaches and no shortness of breath. The symptoms are aggravated by walking.   Karina Green is a 58 y.o. female with h/o of gout and arthritis who presents to the Emergency Department complaining of sudden gradually worsening lateral left foot pain onset 3 days. Pt reports pain began in left ankle but now has worsened pain to left foot. Pt denies known injury. Pt states she walks bear footed and wear flip flops often. She denies pain feeling similar to gout flare up or arthritis. Pt denies walking on uneven terrain. She denies walking bending or moving more than normal. Pt denies fever to left foot. She reports she was taking meloxicam but has stopped due to medication effecting kidneys.   Leonides Grills, MD  Past Medical History  Diagnosis Date  . Hypertension   . Back pain   . Arthritis    Past Surgical History  Procedure Laterality Date  . Tonsillectomy    . Bladder tack    . Total hip arthroplasty    . Hernia repair    . Cholecystectomy    . Abdominal hysterectomy    . Appendectomy     History reviewed. No pertinent family history. History  Substance Use Topics  . Smoking status: Never Smoker   . Smokeless tobacco: Not on file  . Alcohol Use: No   OB  History   Grav Para Term Preterm Abortions TAB SAB Ect Mult Living                 Review of Systems  Respiratory: Negative for shortness of breath.   Cardiovascular: Negative for chest pain.  Gastrointestinal: Negative for abdominal pain.  Musculoskeletal: Positive for myalgias. Negative for arthralgias and joint swelling.  Neurological: Negative for headaches.  All other systems reviewed and are negative.  Allergies  Review of patient's allergies indicates no known allergies.  Home Medications   Prior to Admission medications   Medication Sig Start Date End Date Taking? Authorizing Provider  ALPRAZolam Duanne Moron) 0.5 MG tablet Take 0.5 mg by mouth daily as needed. Panic attacks/anxiety.    Historical Provider, MD  cyclobenzaprine (FLEXERIL) 10 MG tablet Take 10 mg by mouth daily as needed. Back spasms.    Historical Provider, MD  diphenhydrAMINE (BENADRYL) 25 mg capsule Take 25 mg by mouth at bedtime as needed. For allergies    Historical Provider, MD  hydrochlorothiazide (HYDRODIURIL) 25 MG tablet Take 25 mg by mouth daily.    Historical Provider, MD  HYDROcodone-acetaminophen (NORCO) 5-325 MG per tablet Take 2 tablets by mouth every 4 (four) hours as needed for pain. 10/06/13   Veryl Speak, MD  ibuprofen (ADVIL,MOTRIN) 200 MG tablet Take 400 mg by mouth 2 (two) times daily as needed. For pain  Historical Provider, MD  indomethacin (INDOCIN) 25 MG capsule Take 1 capsule (25 mg total) by mouth 3 (three) times daily as needed. 10/06/13   Veryl Speak, MD  lisinopril (PRINIVIL,ZESTRIL) 20 MG tablet Take 20 mg by mouth daily.    Historical Provider, MD  meloxicam (MOBIC) 15 MG tablet Take 15 mg by mouth daily.    Historical Provider, MD  sulfamethoxazole-trimethoprim (SEPTRA DS) 800-160 MG per tablet Take 1 tablet by mouth 2 (two) times daily. 09/18/12   Tammy L. Triplett, PA-C   BP 112/80  Pulse 74  Temp(Src) 98.3 F (36.8 C) (Oral)  Resp 18  Ht 5\' 3"  (1.6 m)  Wt 233 lb (105.688  kg)  BMI 41.28 kg/m2  SpO2 96% Physical Exam  Nursing note and vitals reviewed. Constitutional: She is oriented to person, place, and time. She appears well-developed and well-nourished. No distress.  HENT:  Head: Normocephalic and atraumatic.  Eyes: Conjunctivae and EOM are normal.  Neck: Neck supple.  Cardiovascular: Normal rate.   Pulses:      Dorsalis pedis pulses are 2+ on the left side.  Pulmonary/Chest: Effort normal.  Musculoskeletal: Normal range of motion.  TTP no deformity of metatarsal heads, no lesion between toes. Cap refill < 2. Achilles tendon intact. No edema. TTP over tibiotalar  tendon area. No hematoma or deformity of metatarsals. Mild puffiness of the lateral malleolus. Good ROM of both knees. Mild crepitus of the left knee.  Neurological: She is alert and oriented to person, place, and time.  Skin: Skin is warm and dry.  Psychiatric: She has a normal mood and affect. Her behavior is normal.    ED Course  Procedures (including critical care time) DIAGNOSTIC STUDIES: Oxygen Saturation is 96% on RA, normal by my interpretation.    COORDINATION OF CARE: 5:24 PM- Pt advised of plan for treatment including pain medication and pt agrees. Pt has been advised to elevate left ankle and to f/u with PCP  Labs Review Labs Reviewed - No data to display  Imaging Review No results found.   EKG Interpretation None      M Patient does not recall injury to the left foot, but has swelling of the lateral malleolus. The patient states it feels as though she may have had a sprain. It is also of note that the patient has a history of arthritis and gout, the patient states this episode does not feel like a gout attack. Suspect the patient has a sprain. Patient will be placed in an ankle stirrup splint and prescription for Tylenol with Codeine given to the patient. The patient will be followed by orthopedics if not improving.    Final diagnoses:  None    I personally  performed the services described in this documentation, which was scribed in my presence. The recorded information has been reviewed and is accurate.       Lenox Ahr, PA-C 07/10/14 [phone]

## 2014-07-10 NOTE — ED Provider Notes (Signed)
Medical screening examination/treatment/procedure(s) were performed by non-physician practitioner and as supervising physician I was immediately available for consultation/collaboration.   EKG Interpretation None        Merryl Hacker, MD 07/10/14 2207

## 2014-10-24 ENCOUNTER — Emergency Department (HOSPITAL_COMMUNITY): Payer: No Typology Code available for payment source

## 2014-10-24 ENCOUNTER — Encounter (HOSPITAL_COMMUNITY): Payer: Self-pay | Admitting: *Deleted

## 2014-10-24 ENCOUNTER — Inpatient Hospital Stay (HOSPITAL_COMMUNITY)
Admission: EM | Admit: 2014-10-24 | Discharge: 2014-11-04 | DRG: 234 | Disposition: A | Discharge status: Home / Self Care | Payer: No Typology Code available for payment source | Attending: Cardiothoracic Surgery | Admitting: Cardiothoracic Surgery

## 2014-10-24 DIAGNOSIS — E119 Type 2 diabetes mellitus without complications: Secondary | ICD-10-CM

## 2014-10-24 DIAGNOSIS — I2510 Atherosclerotic heart disease of native coronary artery without angina pectoris: Secondary | ICD-10-CM | POA: Diagnosis present

## 2014-10-24 DIAGNOSIS — R0789 Other chest pain: Secondary | ICD-10-CM

## 2014-10-24 DIAGNOSIS — I129 Hypertensive chronic kidney disease with stage 1 through stage 4 chronic kidney disease, or unspecified chronic kidney disease: Secondary | ICD-10-CM | POA: Diagnosis present

## 2014-10-24 DIAGNOSIS — Z8249 Family history of ischemic heart disease and other diseases of the circulatory system: Secondary | ICD-10-CM

## 2014-10-24 DIAGNOSIS — I25111 Atherosclerotic heart disease of native coronary artery with angina pectoris with documented spasm: Secondary | ICD-10-CM

## 2014-10-24 DIAGNOSIS — Z96649 Presence of unspecified artificial hip joint: Secondary | ICD-10-CM | POA: Diagnosis present

## 2014-10-24 DIAGNOSIS — I25110 Atherosclerotic heart disease of native coronary artery with unstable angina pectoris: Secondary | ICD-10-CM | POA: Diagnosis not present

## 2014-10-24 DIAGNOSIS — N183 Chronic kidney disease, stage 3 (moderate): Secondary | ICD-10-CM | POA: Diagnosis present

## 2014-10-24 DIAGNOSIS — I10 Essential (primary) hypertension: Secondary | ICD-10-CM | POA: Diagnosis present

## 2014-10-24 DIAGNOSIS — J9811 Atelectasis: Secondary | ICD-10-CM | POA: Diagnosis not present

## 2014-10-24 DIAGNOSIS — B9689 Other specified bacterial agents as the cause of diseases classified elsewhere: Secondary | ICD-10-CM | POA: Diagnosis not present

## 2014-10-24 DIAGNOSIS — E7800 Pure hypercholesterolemia, unspecified: Secondary | ICD-10-CM | POA: Diagnosis present

## 2014-10-24 DIAGNOSIS — I4891 Unspecified atrial fibrillation: Secondary | ICD-10-CM | POA: Diagnosis not present

## 2014-10-24 DIAGNOSIS — Z951 Presence of aortocoronary bypass graft: Secondary | ICD-10-CM

## 2014-10-24 DIAGNOSIS — R079 Chest pain, unspecified: Secondary | ICD-10-CM | POA: Diagnosis not present

## 2014-10-24 DIAGNOSIS — Z79899 Other long term (current) drug therapy: Secondary | ICD-10-CM

## 2014-10-24 DIAGNOSIS — I200 Unstable angina: Secondary | ICD-10-CM | POA: Diagnosis present

## 2014-10-24 DIAGNOSIS — E8770 Fluid overload, unspecified: Secondary | ICD-10-CM | POA: Diagnosis not present

## 2014-10-24 DIAGNOSIS — E1165 Type 2 diabetes mellitus with hyperglycemia: Secondary | ICD-10-CM | POA: Diagnosis present

## 2014-10-24 DIAGNOSIS — N390 Urinary tract infection, site not specified: Secondary | ICD-10-CM | POA: Diagnosis not present

## 2014-10-24 DIAGNOSIS — Z9289 Personal history of other medical treatment: Secondary | ICD-10-CM

## 2014-10-24 DIAGNOSIS — E785 Hyperlipidemia, unspecified: Secondary | ICD-10-CM | POA: Diagnosis present

## 2014-10-24 DIAGNOSIS — R52 Pain, unspecified: Secondary | ICD-10-CM

## 2014-10-24 DIAGNOSIS — D62 Acute posthemorrhagic anemia: Secondary | ICD-10-CM | POA: Diagnosis not present

## 2014-10-24 HISTORY — DX: Hyperlipidemia, unspecified: E78.5

## 2014-10-24 HISTORY — DX: Atherosclerotic heart disease of native coronary artery without angina pectoris: I25.10

## 2014-10-24 HISTORY — DX: Type 2 diabetes mellitus without complications: E11.9

## 2014-10-24 LAB — CBC WITH DIFFERENTIAL/PLATELET
BASOS ABS: 0 10*3/uL (ref 0.0–0.1)
BASOS PCT: 0 % (ref 0–1)
EOS ABS: 0.2 10*3/uL (ref 0.0–0.7)
Eosinophils Relative: 2 % (ref 0–5)
HCT: 35.9 % — ABNORMAL LOW (ref 36.0–46.0)
Hemoglobin: 11.4 g/dL — ABNORMAL LOW (ref 12.0–15.0)
Lymphocytes Relative: 33 % (ref 12–46)
Lymphs Abs: 4 10*3/uL (ref 0.7–4.0)
MCH: 27 pg (ref 26.0–34.0)
MCHC: 31.8 g/dL (ref 30.0–36.0)
MCV: 84.9 fL (ref 78.0–100.0)
MONOS PCT: 6 % (ref 3–12)
Monocytes Absolute: 0.8 10*3/uL (ref 0.1–1.0)
NEUTROS ABS: 7.2 10*3/uL (ref 1.7–7.7)
NEUTROS PCT: 59 % (ref 43–77)
PLATELETS: 304 10*3/uL (ref 150–400)
RBC: 4.23 MIL/uL (ref 3.87–5.11)
RDW: 15 % (ref 11.5–15.5)
WBC: 12.1 10*3/uL — ABNORMAL HIGH (ref 4.0–10.5)

## 2014-10-24 LAB — COMPREHENSIVE METABOLIC PANEL
ALBUMIN: 3.6 g/dL (ref 3.5–5.2)
ALK PHOS: 74 U/L (ref 39–117)
ALT: 17 U/L (ref 0–35)
ANION GAP: 15 (ref 5–15)
AST: 16 U/L (ref 0–37)
BILIRUBIN TOTAL: 0.2 mg/dL — AB (ref 0.3–1.2)
BUN: 28 mg/dL — AB (ref 6–23)
CHLORIDE: 106 meq/L (ref 96–112)
CO2: 19 mEq/L (ref 19–32)
Calcium: 9.4 mg/dL (ref 8.4–10.5)
Creatinine, Ser: 1.31 mg/dL — ABNORMAL HIGH (ref 0.50–1.10)
GFR calc Af Amer: 50 mL/min — ABNORMAL LOW (ref >90)
GFR calc non Af Amer: 43 mL/min — ABNORMAL LOW (ref >90)
Glucose, Bld: 145 mg/dL — ABNORMAL HIGH (ref 70–99)
POTASSIUM: 4 meq/L (ref 3.7–5.3)
Sodium: 140 mEq/L (ref 137–147)
TOTAL PROTEIN: 7.8 g/dL (ref 6.0–8.3)

## 2014-10-24 LAB — TROPONIN I

## 2014-10-24 MED ORDER — ASPIRIN 325 MG PO TABS
325.0000 mg | ORAL_TABLET | Freq: Every day | ORAL | Status: DC
  Administered 2014-10-25 – 2014-10-28 (×4): 325 mg via ORAL
  Filled 2014-10-24 (×5): qty 1

## 2014-10-24 MED ORDER — NITROGLYCERIN 2 % TD OINT
0.5000 [in_us] | TOPICAL_OINTMENT | Freq: Four times a day (QID) | TRANSDERMAL | Status: DC
  Administered 2014-10-25 – 2014-10-29 (×18): 0.5 [in_us] via TOPICAL
  Filled 2014-10-24 (×2): qty 1
  Filled 2014-10-24: qty 30
  Filled 2014-10-24 (×2): qty 1

## 2014-10-24 MED ORDER — ENOXAPARIN SODIUM 40 MG/0.4ML ~~LOC~~ SOLN: 40.0000 mg | SUBCUTANEOUS | Status: DC

## 2014-10-24 MED ORDER — SODIUM CHLORIDE 0.9 % IJ SOLN
3.0000 mL | Freq: Two times a day (BID) | INTRAMUSCULAR | Status: DC
  Administered 2014-10-25: 3 mL via INTRAVENOUS

## 2014-10-24 MED ORDER — NITROGLYCERIN 2 % TD OINT
1.0000 [in_us] | TOPICAL_OINTMENT | Freq: Once | TRANSDERMAL | Status: AC
  Administered 2014-10-24: 1 [in_us] via TOPICAL
  Filled 2014-10-24: qty 1

## 2014-10-24 MED ORDER — ACETAMINOPHEN 650 MG RE SUPP: 650.0000 mg | Freq: Four times a day (QID) | RECTAL | Status: DC | PRN

## 2014-10-24 MED ORDER — NITROGLYCERIN 0.4 MG SL SUBL
0.4000 mg | SUBLINGUAL_TABLET | Freq: Once | SUBLINGUAL | Status: AC
  Administered 2014-10-24: 0.4 mg via SUBLINGUAL
  Filled 2014-10-24: qty 1

## 2014-10-24 MED ORDER — ALUM & MAG HYDROXIDE-SIMETH 200-200-20 MG/5ML PO SUSP: 30.0000 mL | Freq: Four times a day (QID) | ORAL | Status: DC | PRN

## 2014-10-24 MED ORDER — OXYCODONE HCL 5 MG PO TABS: 5.0000 mg | ORAL_TABLET | ORAL | Status: DC | PRN

## 2014-10-24 MED ORDER — ALPRAZOLAM 0.5 MG PO TABS: 0.5000 mg | ORAL_TABLET | Freq: Two times a day (BID) | ORAL | Status: DC | PRN

## 2014-10-24 MED ORDER — ONDANSETRON HCL 4 MG PO TABS: 4.0000 mg | ORAL_TABLET | Freq: Four times a day (QID) | ORAL | Status: DC | PRN

## 2014-10-24 MED ORDER — ONDANSETRON HCL 4 MG/2ML IJ SOLN: 4.0000 mg | Freq: Four times a day (QID) | INTRAMUSCULAR | Status: DC | PRN

## 2014-10-24 MED ORDER — ACETAMINOPHEN 325 MG PO TABS
650.0000 mg | ORAL_TABLET | Freq: Four times a day (QID) | ORAL | Status: DC | PRN
  Administered 2014-10-28: 650 mg via ORAL
  Filled 2014-10-24: qty 2

## 2014-10-24 MED ORDER — LISINOPRIL 20 MG PO TABS
20.0000 mg | ORAL_TABLET | Freq: Every day | ORAL | Status: DC
  Administered 2014-10-25 – 2014-10-28 (×4): 20 mg via ORAL
  Filled 2014-10-24 (×3): qty 1
  Filled 2014-10-24: qty 2
  Filled 2014-10-24: qty 1

## 2014-10-24 MED ORDER — SODIUM CHLORIDE 0.9 % IV SOLN: 250.0000 mL | INTRAVENOUS | Status: DC | PRN

## 2014-10-24 MED ORDER — HYDROMORPHONE HCL 1 MG/ML IJ SOLN: 0.5000 mg | INTRAMUSCULAR | Status: DC | PRN

## 2014-10-24 MED ORDER — SODIUM CHLORIDE 0.9 % IV SOLN
1000.0000 mL | Freq: Once | INTRAVENOUS | Status: AC
  Administered 2014-10-24: 1000 mL via INTRAVENOUS

## 2014-10-24 MED ORDER — HYDROCHLOROTHIAZIDE 25 MG PO TABS
25.0000 mg | ORAL_TABLET | Freq: Every day | ORAL | Status: DC
  Administered 2014-10-25 – 2014-10-28 (×4): 25 mg via ORAL
  Filled 2014-10-24 (×5): qty 1

## 2014-10-24 MED ORDER — SODIUM CHLORIDE 0.9 % IJ SOLN: 3.0000 mL | INTRAMUSCULAR | Status: DC | PRN

## 2014-10-24 MED ORDER — SODIUM CHLORIDE 0.9 % IJ SOLN
3.0000 mL | Freq: Two times a day (BID) | INTRAMUSCULAR | Status: DC
  Administered 2014-10-26 – 2014-10-28 (×3): 3 mL via INTRAVENOUS

## 2014-10-24 MED ORDER — SIMVASTATIN 20 MG PO TABS: 20.0000 mg | ORAL_TABLET | Freq: Every day | ORAL | Status: DC

## 2014-10-24 NOTE — H&P (Signed)
[facility] Admission History and Physical       Karina Green OMV:[id number] DOB: [birth year] DOA: 10/24/2014  Referring physician: EDP PCP: Leonides Grills, MD  Specialists:   Chief Complaint: Chest Pain/Pressure  HPI: Karina Green is a 58 y.o. female with a history of HTN, and Hyperlipidemia who presents to the ED with complaints of intermittent substernal chest pressure radiating across her chest and into her left shoulder x 1 day.   The discomfort continued to worsen today.   She denies having SOB or nausea or vomiting associated with the pain.  When she arrived to the ED, her pain was relieved after 1 SL NTG had been given in the ED.   She is a Non-Smoker, but she does have a strong family history of CAD.   She reports that she had a negative stress test in the past, years ago.      Review of Systems:  Constitutional: No Weight Loss, No Weight Gain, Night Sweats, Fevers, Chills, Dizziness, Fatigue, or Generalized Weakness HEENT: No Headaches, Difficulty Swallowing,Tooth/Dental Problems,Sore Throat,  No Sneezing, Rhinitis, Ear Ache, Nasal Congestion, or Post Nasal Drip,  Cardio-vascular:  +Chest pain, Orthopnea, PND, Edema in Lower Extremities, Anasarca, Dizziness, Palpitations  Resp: No Dyspnea, No DOE, No Productive Cough, No Non-Productive Cough, No Hemoptysis, No Wheezing.    GI: No Heartburn, Indigestion, Abdominal Pain, Nausea, Vomiting, Diarrhea, Hematemesis, Hematochezia, Melena, Change in Bowel Habits,  Loss of Appetite  GU: No Dysuria, Change in Color of Urine, No Urgency or Frequency, No Flank pain.  Musculoskeletal: No Joint Pain or Swelling, No Decreased Range of Motion, No Back Pain.  Neurologic: No Syncope, No Seizures, Muscle Weakness, Paresthesia, Vision Disturbance or Loss, No Diplopia, No Vertigo, No Difficulty Walking,  Skin: No Rash or Lesions. Psych: No Change in Mood or Affect, No Depression or Anxiety, No Memory loss, No Confusion, or  Hallucinations   Past Medical History  Diagnosis Date  . Hypertension   . Back pain   . Arthritis   . Hyperlipidemia      Past Surgical History  Procedure Laterality Date  . Tonsillectomy    . Bladder tack    . Total hip arthroplasty    . Hernia repair    . Cholecystectomy    . Abdominal hysterectomy    . Appendectomy         Prior to Admission medications   Medication Sig Start Date End Date Taking? Authorizing Provider  ALPRAZolam Duanne Moron) 0.5 MG tablet Take 0.5 mg by mouth daily as needed. Panic attacks/anxiety.   Yes Historical Provider, MD  hydrochlorothiazide (HYDRODIURIL) 25 MG tablet Take 25 mg by mouth daily.   Yes Historical Provider, MD  ibuprofen (ADVIL,MOTRIN) 200 MG tablet Take 400 mg by mouth 2 (two) times daily as needed. For pain   Yes Historical Provider, MD  lisinopril (PRINIVIL,ZESTRIL) 20 MG tablet Take 20 mg by mouth daily.   Yes Historical Provider, MD  simvastatin (ZOCOR) 20 MG tablet Take 20 mg by mouth daily.   Yes Historical Provider, MD  acetaminophen-codeine (TYLENOL #3) 300-30 MG per tablet Take 1-2 tablets by mouth every 6 (six) hours as needed. Patient not taking: Reported on 10/24/2014 07/09/14   Lenox Ahr, PA-C  HYDROcodone-acetaminophen [facility]) 5-325 MG per tablet Take 2 tablets by mouth every 4 (four) hours as needed for pain. Patient not taking: Reported on 10/24/2014 10/06/13   Veryl Speak, MD  indomethacin (INDOCIN) 25 MG capsule Take 1 capsule (25 mg total) by  mouth 3 (three) times daily as needed. Patient not taking: Reported on 10/24/2014 10/06/13   Veryl Speak, MD  sulfamethoxazole-trimethoprim (SEPTRA DS) 800-160 MG per tablet Take 1 tablet by mouth 2 (two) times daily. Patient not taking: Reported on 10/24/2014 09/18/12   Tammy L. Triplett, PA-C     No Known Allergies    Social History:  reports that she has never smoked. She does not have any smokeless tobacco history on file. She reports that she does not drink alcohol or  use illicit drugs.      Family History:     CAD in Mother, Father, Both Grandfathers, and her Maternal Grandmother    Physical Exam:  GEN:  Pleasant Obese  58 y.o. Caucasian female examined and in no acute distress; cooperative with exam Filed Vitals:   10/24/14 2035 10/24/14 2047 10/24/14 2100 10/24/14 2202  BP: 155/98 115/70 118/63 114/72  Pulse: 74 81 69 63  Resp: 18 18 14    Height: 5\' 3"  (1.6 m)     Weight: 102.059 kg (225 lb)     SpO2: 98% 97% 96% 100%   Blood pressure 114/72, pulse 63, resp. rate 14, height 5\' 3"  (1.6 m), weight 102.059 kg (225 lb), SpO2 100 %. PSYCH: She is alert and oriented x4; does not appear anxious does not appear depressed; affect is normal HEENT: Normocephalic and Atraumatic, Mucous membranes pink; PERRLA; EOM intact; Fundi:  Benign;  No scleral icterus, Nares: Patent, Oropharynx: Clear, Fair Dentition,    Neck:  FROM, No Cervical Lymphadenopathy nor Thyromegaly or Carotid Bruit; No JVD; Breasts:: Not examined CHEST WALL: No tenderness CHEST: Normal respiration, clear to auscultation bilaterally HEART: Regular rate and rhythm; no murmurs rubs or gallops BACK: No kyphosis or scoliosis; No CVA tenderness ABDOMEN: Positive Bowel Sounds, Obese, Soft Non-Tender; No Masses, No Organomegaly. Rectal Exam: Not done EXTREMITIES: No Cyanosis, Clubbing, or Edema; No Ulcerations. Genitalia: not examined PULSES: 2+ and symmetric SKIN: Normal hydration no rash or ulceration CNS:  Alert and Oriented x 4, No Focal Deficits Vascular: pulses palpable throughout    Labs on Admission:  Basic Metabolic Panel:  Recent Labs Lab 10/24/14 2037  NA 140  K 4.0  CL 106  CO2 19  GLUCOSE 145*  BUN 28*  CREATININE 1.31*  CALCIUM 9.4   Liver Function Tests:  Recent Labs Lab 10/24/14 2037  AST 16  ALT 17  ALKPHOS 74  BILITOT 0.2*  PROT 7.8  ALBUMIN 3.6   No results for input(s): LIPASE, AMYLASE in the last 168 hours. No results for input(s): AMMONIA in  the last 168 hours. CBC:  Recent Labs Lab 10/24/14 2037  WBC 12.1*  NEUTROABS 7.2  HGB 11.4*  HCT 35.9*  MCV 84.9  PLT 304   Cardiac Enzymes:  Recent Labs Lab 10/24/14 2037  TROPONINI <0.30    BNP (last 3 results) No results for input(s): PROBNP in the last 8760 hours. CBG: No results for input(s): GLUCAP in the last 168 hours.  Radiological Exams on Admission: Dg Chest Portable 1 View  10/24/2014   CLINICAL DATA:  Medial on left-sided chest pressure since yesterday. Pain more severe tonight.  EXAM: PORTABLE CHEST - 1 VIEW  COMPARISON:  04/13/2008  FINDINGS: Shallow inspiration. The heart size and mediastinal contours are within normal limits. Both lungs are clear. The visualized skeletal structures are unremarkable.  IMPRESSION: No active disease.   Electronically Signed   By: Lucienne Capers M.D.   On: 10/24/2014 21:07  EKG: Independently reviewed.    Assessment/Plan:   58 y.o. female with   Principal Problem:   1.   Chest pain of uncertain etiology   Telemetry Monitoring   Cycle Troponins   Nitropaste, O2, ASA Rx, continue Simvastatin Rx   2D ECHO in AM   Fasting Lipids in AM   NPO after Midnight for possible Stress Testing  Active Problems:   2.   Essential hypertension   Continue Lisinopril and HCTZ Rx   Monitor BPs     3.   Hypercholesteremia   Continue Simvastatin Rx.        4.   DVT Prophylaxis   Lovenox      Code Status:   FULL CODE    Family Communication:    Daughter at Bedside Disposition Plan:     Observation Telemetry Monitoring  Time spent:  [place] [facility] Pager [phone]   If [place] Please Contact the Day Rounding Team MD for [facility]  If 7PM-7AM, Please Contact Night-Floor Coverage  [email or web address] Password [email or web address] 10/24/2014, 10:34 PM

## 2014-10-24 NOTE — ED Provider Notes (Signed)
CSN: [id number]     Arrival date & time 10/24/14  2023 History   First MD Initiated Contact with Patient 10/24/14 2027     Chief Complaint  Patient presents with  . Chest Pain     (Consider location/radiation/quality/duration/timing/severity/associated sxs/prior Treatment) Patient is a 58 y.o. female presenting with chest pain. The history is provided by the patient (the pt complains of chest pressure).  Chest Pain Pain location:  Substernal area Pain quality: aching   Pain radiates to:  L shoulder Pain severity:  Moderate Onset quality:  Sudden Timing:  Intermittent Progression:  Waxing and waning Chronicity:  New Context: breathing   Associated symptoms: no abdominal pain, no back pain, no cough, no fatigue and no headache     Past Medical History  Diagnosis Date  . Hypertension   . Back pain   . Arthritis   . Hyperlipidemia    Past Surgical History  Procedure Laterality Date  . Tonsillectomy    . Bladder tack    . Total hip arthroplasty    . Hernia repair    . Cholecystectomy    . Abdominal hysterectomy    . Appendectomy     History reviewed. No pertinent family history. History  Substance Use Topics  . Smoking status: Never Smoker   . Smokeless tobacco: Not on file  . Alcohol Use: No   OB History    No data available     Review of Systems  Constitutional: Negative for appetite change and fatigue.  HENT: Negative for congestion, ear discharge and sinus pressure.   Eyes: Negative for discharge.  Respiratory: Negative for cough.   Cardiovascular: Positive for chest pain.  Gastrointestinal: Negative for abdominal pain and diarrhea.  Genitourinary: Negative for frequency and hematuria.  Musculoskeletal: Negative for back pain.  Skin: Negative for rash.  Neurological: Negative for seizures and headaches.  Psychiatric/Behavioral: Negative for hallucinations.      Allergies  Review of patient's allergies indicates no known allergies.  Home  Medications   Prior to Admission medications   Medication Sig Start Date End Date Taking? Authorizing Provider  ALPRAZolam Duanne Moron) 0.5 MG tablet Take 0.5 mg by mouth daily as needed. Panic attacks/anxiety.   Yes Historical Provider, MD  cyclobenzaprine (FLEXERIL) 10 MG tablet Take 10 mg by mouth daily as needed. Back spasms.   Yes Historical Provider, MD  diphenhydrAMINE (BENADRYL) 25 mg capsule Take 25 mg by mouth at bedtime as needed. For allergies   Yes Historical Provider, MD  hydrochlorothiazide (HYDRODIURIL) 25 MG tablet Take 25 mg by mouth daily.   Yes Historical Provider, MD  ibuprofen (ADVIL,MOTRIN) 200 MG tablet Take 400 mg by mouth 2 (two) times daily as needed. For pain   Yes Historical Provider, MD  lisinopril (PRINIVIL,ZESTRIL) 20 MG tablet Take 20 mg by mouth daily.   Yes Historical Provider, MD  simvastatin (ZOCOR) 20 MG tablet Take 20 mg by mouth daily.   Yes Historical Provider, MD  acetaminophen-codeine (TYLENOL #3) 300-30 MG per tablet Take 1-2 tablets by mouth every 6 (six) hours as needed. 07/09/14   Lenox Ahr, PA-C  HYDROcodone-acetaminophen (NORCO) 5-325 MG per tablet Take 2 tablets by mouth every 4 (four) hours as needed for pain. 10/06/13   Veryl Speak, MD  indomethacin (INDOCIN) 25 MG capsule Take 1 capsule (25 mg total) by mouth 3 (three) times daily as needed. 10/06/13   Veryl Speak, MD  meloxicam (MOBIC) 15 MG tablet Take 15 mg by mouth daily.  Historical Provider, MD  sulfamethoxazole-trimethoprim (SEPTRA DS) 800-160 MG per tablet Take 1 tablet by mouth 2 (two) times daily. 09/18/12   Tammy L. Triplett, PA-C   BP 118/63 mmHg  Pulse 69  Resp 14  Ht 5\' 3"  (1.6 m)  Wt 225 lb (102.059 kg)  BMI 39.87 kg/m2  SpO2 96% Physical Exam  Constitutional: She is oriented to person, place, and time. She appears well-developed.  HENT:  Head: Normocephalic.  Eyes: Conjunctivae and EOM are normal. No scleral icterus.  Neck: Neck supple. No thyromegaly present.   Cardiovascular: Normal rate and regular rhythm.  Exam reveals no gallop and no friction rub.   No murmur heard. Pulmonary/Chest: No stridor. She has no wheezes. She has no rales. She exhibits no tenderness.  Abdominal: She exhibits no distension. There is no tenderness. There is no rebound.  Musculoskeletal: Normal range of motion. She exhibits no edema.  Lymphadenopathy:    She has no cervical adenopathy.  Neurological: She is oriented to person, place, and time. She exhibits normal muscle tone. Coordination normal.  Skin: No rash noted. No erythema.  Psychiatric: She has a normal mood and affect. Her behavior is normal.    ED Course  Procedures (including critical care time) Labs Review Labs Reviewed  CBC WITH DIFFERENTIAL - Abnormal; Notable for the following:    WBC 12.1 (*)    Hemoglobin 11.4 (*)    HCT 35.9 (*)    All other components within normal limits  COMPREHENSIVE METABOLIC PANEL - Abnormal; Notable for the following:    Glucose, Bld 145 (*)    BUN 28 (*)    Creatinine, Ser 1.31 (*)    Total Bilirubin 0.2 (*)    GFR calc non Af Amer 43 (*)    GFR calc Af Amer 50 (*)    All other components within normal limits  TROPONIN I    Imaging Review Dg Chest Portable 1 View  10/24/2014   CLINICAL DATA:  Medial on left-sided chest pressure since yesterday. Pain more severe tonight.  EXAM: PORTABLE CHEST - 1 VIEW  COMPARISON:  04/13/2008  FINDINGS: Shallow inspiration. The heart size and mediastinal contours are within normal limits. Both lungs are clear. The visualized skeletal structures are unremarkable.  IMPRESSION: No active disease.   Electronically Signed   By: Lucienne Capers M.D.   On: 10/24/2014 21:07     EKG Interpretation   Date/Time:  Wednesday October 24 2014 20:32:44 EST Ventricular Rate:  72 PR Interval:  162 QRS Duration: 81 QT Interval:  377 QTC Calculation: 412 R Axis:   32 Text Interpretation:  Sinus rhythm Low voltage, precordial leads   Nonspecific T abnormalities, anterior leads Confirmed by Saysha Menta  MD,  Aquanetta Schwarz [phone]) on 10/24/2014 9:21:57 PM      MDM   Final diagnoses:  Pain    The chart was scribed for me under my direct supervision.  I personally performed the history, physical, and medical decision making and all procedures in the evaluation of this patient.Maudry Diego, MD 10/24/14 2122

## 2014-10-24 NOTE — ED Notes (Signed)
Pt reporting pain in left portion of chest and radiating into left shoulder.  Pt denies nausea or vomiting.  Reports non-productive cough.  States pain began yesterday afternoon and intermittent since then.  States pain "feels more intense" than it did yesterday.  Pt reporting pain as "more of a bad pressure".

## 2014-10-24 NOTE — ED Notes (Signed)
Will hold remaining two doses of nitroglycerin SL as pt states she is relieved from pain and pressure.

## 2014-10-25 ENCOUNTER — Encounter (HOSPITAL_COMMUNITY): Payer: Self-pay | Admitting: Adult Health

## 2014-10-25 DIAGNOSIS — I200 Unstable angina: Secondary | ICD-10-CM | POA: Diagnosis present

## 2014-10-25 DIAGNOSIS — D62 Acute posthemorrhagic anemia: Secondary | ICD-10-CM | POA: Diagnosis not present

## 2014-10-25 DIAGNOSIS — I129 Hypertensive chronic kidney disease with stage 1 through stage 4 chronic kidney disease, or unspecified chronic kidney disease: Secondary | ICD-10-CM | POA: Diagnosis present

## 2014-10-25 DIAGNOSIS — B9689 Other specified bacterial agents as the cause of diseases classified elsewhere: Secondary | ICD-10-CM | POA: Diagnosis not present

## 2014-10-25 DIAGNOSIS — I4891 Unspecified atrial fibrillation: Secondary | ICD-10-CM | POA: Diagnosis not present

## 2014-10-25 DIAGNOSIS — J9811 Atelectasis: Secondary | ICD-10-CM | POA: Diagnosis not present

## 2014-10-25 DIAGNOSIS — Z8249 Family history of ischemic heart disease and other diseases of the circulatory system: Secondary | ICD-10-CM | POA: Diagnosis not present

## 2014-10-25 DIAGNOSIS — Z96649 Presence of unspecified artificial hip joint: Secondary | ICD-10-CM | POA: Diagnosis present

## 2014-10-25 DIAGNOSIS — I25110 Atherosclerotic heart disease of native coronary artery with unstable angina pectoris: Secondary | ICD-10-CM | POA: Diagnosis present

## 2014-10-25 DIAGNOSIS — I379 Nonrheumatic pulmonary valve disorder, unspecified: Secondary | ICD-10-CM

## 2014-10-25 DIAGNOSIS — R079 Chest pain, unspecified: Secondary | ICD-10-CM | POA: Diagnosis present

## 2014-10-25 DIAGNOSIS — I2510 Atherosclerotic heart disease of native coronary artery without angina pectoris: Secondary | ICD-10-CM

## 2014-10-25 DIAGNOSIS — E8770 Fluid overload, unspecified: Secondary | ICD-10-CM | POA: Diagnosis not present

## 2014-10-25 DIAGNOSIS — Z79899 Other long term (current) drug therapy: Secondary | ICD-10-CM | POA: Diagnosis not present

## 2014-10-25 DIAGNOSIS — N390 Urinary tract infection, site not specified: Secondary | ICD-10-CM | POA: Diagnosis not present

## 2014-10-25 DIAGNOSIS — E119 Type 2 diabetes mellitus without complications: Secondary | ICD-10-CM

## 2014-10-25 DIAGNOSIS — E1165 Type 2 diabetes mellitus with hyperglycemia: Secondary | ICD-10-CM | POA: Diagnosis present

## 2014-10-25 DIAGNOSIS — N183 Chronic kidney disease, stage 3 (moderate): Secondary | ICD-10-CM | POA: Diagnosis present

## 2014-10-25 DIAGNOSIS — E785 Hyperlipidemia, unspecified: Secondary | ICD-10-CM | POA: Diagnosis present

## 2014-10-25 HISTORY — PX: CARDIAC CATHETERIZATION: SHX172

## 2014-10-25 HISTORY — DX: Type 2 diabetes mellitus without complications: E11.9

## 2014-10-25 HISTORY — DX: Atherosclerotic heart disease of native coronary artery without angina pectoris: I25.10

## 2014-10-25 LAB — HEMOGLOBIN A1C
Hgb A1c MFr Bld: 6.6 % — ABNORMAL HIGH (ref <5.7)
Mean Plasma Glucose: 143 mg/dL — ABNORMAL HIGH (ref <117)

## 2014-10-25 LAB — PROTIME-INR
INR: 1.12 (ref 0.00–1.49)
Prothrombin Time: 14.6 seconds (ref 11.6–15.2)

## 2014-10-25 LAB — APTT: APTT: 52 s — AB (ref 24–37)

## 2014-10-25 LAB — BASIC METABOLIC PANEL
ANION GAP: 12 (ref 5–15)
BUN: 24 mg/dL — ABNORMAL HIGH (ref 6–23)
CHLORIDE: 111 meq/L (ref 96–112)
CO2: 20 meq/L (ref 19–32)
CREATININE: 1.21 mg/dL — AB (ref 0.50–1.10)
Calcium: 8.8 mg/dL (ref 8.4–10.5)
GFR calc Af Amer: 55 mL/min — ABNORMAL LOW (ref >90)
GFR calc non Af Amer: 48 mL/min — ABNORMAL LOW (ref >90)
Glucose, Bld: 96 mg/dL (ref 70–99)
Potassium: 4.5 mEq/L (ref 3.7–5.3)
Sodium: 143 mEq/L (ref 137–147)

## 2014-10-25 LAB — LIPID PANEL
Cholesterol: 177 mg/dL (ref 0–200)
HDL: 30 mg/dL — ABNORMAL LOW (ref >39)
LDL CALC: 100 mg/dL — AB (ref 0–99)
Total CHOL/HDL Ratio: 5.9 RATIO
Triglycerides: 235 mg/dL — ABNORMAL HIGH (ref <150)
VLDL: 47 mg/dL — AB (ref 0–40)

## 2014-10-25 LAB — CBC
HEMATOCRIT: 32.3 % — AB (ref 36.0–46.0)
HEMOGLOBIN: 10.1 g/dL — AB (ref 12.0–15.0)
MCH: 26.7 pg (ref 26.0–34.0)
MCHC: 31.3 g/dL (ref 30.0–36.0)
MCV: 85.4 fL (ref 78.0–100.0)
Platelets: 282 10*3/uL (ref 150–400)
RBC: 3.78 MIL/uL — ABNORMAL LOW (ref 3.87–5.11)
RDW: 15.3 % (ref 11.5–15.5)
WBC: 10 10*3/uL (ref 4.0–10.5)

## 2014-10-25 LAB — HEPARIN LEVEL (UNFRACTIONATED)

## 2014-10-25 LAB — TROPONIN I
Troponin I: <0.3 ng/mL (ref <0.30)
Troponin I: <0.3 ng/mL (ref <0.30)

## 2014-10-25 MED ORDER — SODIUM CHLORIDE 0.9 % IJ SOLN: 3.0000 mL | INTRAMUSCULAR | Status: DC | PRN

## 2014-10-25 MED ORDER — HEPARIN BOLUS VIA INFUSION
2000.0000 [IU] | Freq: Once | INTRAVENOUS | Status: AC
  Administered 2014-10-25: 2000 [IU] via INTRAVENOUS
  Filled 2014-10-25: qty 2000

## 2014-10-25 MED ORDER — SODIUM CHLORIDE 0.9 % IJ SOLN
3.0000 mL | Freq: Two times a day (BID) | INTRAMUSCULAR | Status: DC
Start: 2014-10-25 — End: 2014-10-26

## 2014-10-25 MED ORDER — ATORVASTATIN CALCIUM 80 MG PO TABS
80.0000 mg | ORAL_TABLET | Freq: Every day | ORAL | Status: DC
  Administered 2014-10-25 – 2014-11-03 (×9): 80 mg via ORAL
  Filled 2014-10-25 (×4): qty 1
  Filled 2014-10-25: qty 2
  Filled 2014-10-25 (×6): qty 1

## 2014-10-25 MED ORDER — ASPIRIN 81 MG PO CHEW
81.0000 mg | CHEWABLE_TABLET | ORAL | Status: AC
  Administered 2014-10-26: 81 mg via ORAL
  Filled 2014-10-25: qty 1

## 2014-10-25 MED ORDER — HEPARIN (PORCINE) IN NACL 100-0.45 UNIT/ML-% IJ SOLN
1050.0000 [IU]/h | INTRAMUSCULAR | Status: DC
  Administered 2014-10-25: 850 [IU]/h via INTRAVENOUS
  Filled 2014-10-25 (×2): qty 250

## 2014-10-25 MED ORDER — SODIUM CHLORIDE 0.9 % IV SOLN
250.0000 mL | INTRAVENOUS | Status: DC | PRN
Start: 2014-10-25 — End: 2014-10-26

## 2014-10-25 MED ORDER — METOPROLOL TARTRATE 25 MG PO TABS: 12.5000 mg | ORAL_TABLET | Freq: Two times a day (BID) | ORAL | Status: DC

## 2014-10-25 MED ORDER — SODIUM CHLORIDE 0.9 % IV SOLN
1.0000 mL/kg/h | INTRAVENOUS | Status: DC
  Administered 2014-10-26: 1 mL/kg/h via INTRAVENOUS

## 2014-10-25 MED ORDER — HEPARIN BOLUS VIA INFUSION
4000.0000 [IU] | Freq: Once | INTRAVENOUS | Status: AC
  Administered 2014-10-25: 4000 [IU] via INTRAVENOUS
  Filled 2014-10-25: qty 4000

## 2014-10-25 NOTE — Progress Notes (Signed)
Pt received into room 2w21, pt placed on tele, pt lying in bed in no distress, pt oriented to room and call bell Rickard Rhymes, RN

## 2014-10-25 NOTE — Progress Notes (Signed)
ANTICOAGULATION CONSULT NOTE   Pharmacy Consult for Heparin Indication: chest pain/ACS  No Known Allergies  Patient Measurements: Height: 5\' 3"  (160 cm) Weight: 218 lb 11.1 oz (99.2 kg) IBW/kg (Calculated) : 52.4 Heparin Dosing Weight: 72kg  Vital Signs: Temp: 98.5 F (36.9 C) (11/05 1937) Temp Source: Oral (11/05 1937) BP: 142/58 mmHg (11/05 1937) Pulse Rate: 75 (11/05 1937)  Labs:  Recent Labs  10/24/14 2037 10/24/14 2236 10/25/14 0407 10/25/14 1041 10/25/14 1257 10/25/14 2024  HGB 11.4*  --  10.1*  --   --   --   HCT 35.9*  --  32.3*  --   --   --   PLT 304  --  282  --   --   --   APTT  --   --   --   --  52*  --   LABPROT  --   --   --   --  14.6  --   INR  --   --   --   --  1.12  --   HEPARINUNFRC  --   --   --   --   --  <0.10*  CREATININE 1.31*  --  1.21*  --   --   --   TROPONINI <0.30 <0.30 <0.30 <0.30  --   --     Estimated Creatinine Clearance: 55.5 mL/min (by C-G formula based on Cr of 1.21).  Assessment: Patient seen at [facility] with CP- Multiple CAD risk factors including HTN, HL, and strong FH of CAD, transferred here for cath. Initial heparin undetectable on 850units/hr. Baseline aPTT elevated- note this was drawn AFTER initiation of heparin. Baseline INR 1.12. No bleeding noted.  Goal of Therapy:  Heparin level 0.3-0.7 units/ml Monitor platelets by anticoagulation protocol: Yes   Plan:   1. Re-bolus with heparin 2000 units IV x1, then increase infusion to 1050 units/hr 2. Heparin level with AM labs 3. Daily HL and CBC 4. Follow for cath plans and s/s bleeding  Brigitt Mcclish D. Latoria Dry, PharmD, BCPS Clinical Pharmacist Pager: [phone] 10/25/2014 10:08 PM

## 2014-10-25 NOTE — Progress Notes (Signed)
  Echocardiogram 2D Echocardiogram has been performed.  [place], [place] 10/25/2014, 11:59 AM

## 2014-10-25 NOTE — Progress Notes (Signed)
Patient transported to [facility] via [place]. Patient stable. Report given to [facility]. Raeanne Barry, RN to give, Karen Chafe was on phone with nurse given report at the time.

## 2014-10-25 NOTE — Progress Notes (Signed)
ANTICOAGULATION CONSULT NOTE - Initial Consult  Pharmacy Consult for Heparin Indication: chest pain/ACS  No Known Allergies  Patient Measurements: Height: 5\' 3"  (160 cm) Weight: 191 lb 9.3 oz (86.9 kg) IBW/kg (Calculated) : 52.4 Heparin Dosing Weight: 72kg  Vital Signs: Temp: 98.1 F (36.7 C) (11/05 0900) Temp Source: Oral (11/05 0900) BP: 135/61 mmHg (11/05 0900) Pulse Rate: 58 (11/05 0900)  Labs:  Recent Labs  10/24/14 2037 10/24/14 2236 10/25/14 0407 10/25/14 1041  HGB 11.4*  --  10.1*  --   HCT 35.9*  --  32.3*  --   PLT 304  --  282  --   CREATININE 1.31*  --  1.21*  --   TROPONINI <0.30 <0.30 <0.30 <0.30    Estimated Creatinine Clearance: 51.7 mL/min (by C-G formula based on Cr of 1.21).   Medical History: Past Medical History  Diagnosis Date  . Hypertension   . Back pain   . Arthritis   . Hyperlipidemia     Medications:  Prescriptions prior to admission  Medication Sig Dispense Refill Last Dose  . ALPRAZolam (XANAX) 0.5 MG tablet Take 0.5 mg by mouth daily as needed. Panic attacks/anxiety.   unknown at Unknown time  . hydrochlorothiazide (HYDRODIURIL) 25 MG tablet Take 25 mg by mouth daily.   10/24/2014 at Unknown time  . ibuprofen (ADVIL,MOTRIN) 200 MG tablet Take 400 mg by mouth 2 (two) times daily as needed. For pain   unknown  . lisinopril (PRINIVIL,ZESTRIL) 20 MG tablet Take 20 mg by mouth daily.   10/24/2014 at Unknown time  . simvastatin (ZOCOR) 20 MG tablet Take 20 mg by mouth daily.   10/24/2014 at Unknown time  . acetaminophen-codeine (TYLENOL #3) 300-30 MG per tablet Take 1-2 tablets by mouth every 6 (six) hours as needed. (Patient not taking: Reported on 10/24/2014) 20 tablet 0   . HYDROcodone-acetaminophen (NORCO) 5-325 MG per tablet Take 2 tablets by mouth every 4 (four) hours as needed for pain. (Patient not taking: Reported on 10/24/2014) 15 tablet 0   . indomethacin (INDOCIN) 25 MG capsule Take 1 capsule (25 mg total) by mouth 3 (three)  times daily as needed. (Patient not taking: Reported on 10/24/2014) 15 capsule 0   . sulfamethoxazole-trimethoprim (SEPTRA DS) 800-160 MG per tablet Take 1 tablet by mouth 2 (two) times daily. (Patient not taking: Reported on 10/24/2014) 28 tablet 0 Completed Course at Unknown time    Assessment: Okay for Protocol, baseline PT/INR pending.  Multiple CAD risk factors including HTN, HL, and strong FH of CAD, plan for transfer to [facility] for diagnostic [facility].  Goal of Therapy:  Heparin level 0.3-0.7 units/ml Monitor platelets by anticoagulation protocol: Yes   Plan:  Give 4000 units bolus x 1 Start heparin infusion at 850 units/hr Check anti-Xa level in 6-8 hours and daily while on heparin Continue to monitor H&H and platelets  Pricilla Larsson 10/25/2014,11:22 AM

## 2014-10-25 NOTE — Progress Notes (Addendum)
CARDIOLOGY CONSULT NOTE   Patient ID: Karina Green MRN: [id number] DOB/AGE: 07/14/1956 59 y.o.  Admit Date: 10/24/2014 Referring Physician: PTH Primary Physician: Karina Grills, MD Consulting Cardiologist: Karina Dolly MD Primary Cardiologist -New  Reason for Consultation: Chest Pain  Clinical Summary Karina Green is a 58 y.o.femalewith no prior cardiac history but multiple CVRFs to include obesity, strong FH, Hypertension, Hypercholesterolemia, who presents with 48 hour history of intermittent chest pain, coming and going at rest. Described as pressure from midsternal area to the left shoulder and into the back. Lasting a few minutes at a time. Pain continued on and off over the last 2 days with increasing intensity. She has also noticed over the last couple of months that her energy level is decreasing and worsening dyspnea on exertion, i.e.walking up an incline, that is new for her. She cares fro her disabled niece and has noticed that just making her bed causes more fatigue than usual.   On presentation to ER, she was found to 155/98 with HR of 74 bpm. O2 Sat 98%. Slightly  elevated WBC 12.1, Glucose 145. Creatinine 1.31. GFR 50. CXR negative for active disease. Troponin negative X 3. EKG NSR with T-wave inversion in the anterior leads (questionable biphasic T-wave V2) She was given NTG SL: with relieve of pain and now has NTG paste topically. No further chest pain.   States that her brother is an Printmaker and spends hours at the gym, Has CAD requiring a stent.   No Known Allergies  Medications Scheduled Medications: . aspirin  325 mg Oral Daily  . enoxaparin (LOVENOX) injection  40 mg Subcutaneous Q24H  . hydrochlorothiazide  25 mg Oral Daily  . lisinopril  20 mg Oral Daily  . nitroGLYCERIN  0.5 inch Topical 4 times per day  . simvastatin  20 mg Oral Daily  . sodium chloride  3 mL Intravenous Q12H  . sodium chloride  3 mL Intravenous Q12H       PRN  Medications: sodium chloride, acetaminophen **OR** acetaminophen, ALPRAZolam, alum & mag hydroxide-simeth, HYDROmorphone (DILAUDID) injection, ondansetron **OR** ondansetron (ZOFRAN) IV, oxyCODONE, sodium chloride   Past Medical History  Diagnosis Date  . Hypertension   . Back pain   . Arthritis   . Hyperlipidemia     Past Surgical History  Procedure Laterality Date  . Tonsillectomy    . Bladder tack    . Total hip arthroplasty    . Hernia repair    . Cholecystectomy    . Abdominal hysterectomy    . Appendectomy      Family History  Problem Relation Age of Onset  . Hypertension Mother   . Hypertension Father   . Coronary artery disease Mother     CABG  . CAD Father     CABG  . Coronary artery disease Brother     Stent    Social History Karina Green reports that she has never smoked. She does not have any smokeless tobacco history on file. Karina Green reports that she does not drink alcohol.  Review of Systems Otherwise reviewed and negative except as outlined.  Physical Examination Blood pressure 126/66, pulse 54, temperature 98.1 F (36.7 C), temperature source Oral, resp. rate 18, height 5\' 3"  (1.6 m), weight 191 lb 9.3 oz (86.9 kg), SpO2 98 %.  Intake/Output Summary (Last 24 hours) at 10/25/14 1011 Last data filed at 10/24/14 2313  Gross per 24 hour  Intake    240 ml  Output  0 ml  Net    240 ml    Telemetry: NSR  GEN: Resting without complaint. HEENT: Conjunctiva and lids normal, oropharynx clear with moist mucosa. Neck: Supple, no elevated JVP or carotid bruits, no thyromegaly. Lungs: Clear to auscultation, nonlabored breathing at rest. Cardiac: Regular rate and rhythm, no S3 or significant systolic murmur, no pericardial rub. Abdomen: Soft, nontender, no hepatomegaly, bowel sounds present, no guarding or rebound.Obese Extremities: No pitting edema, distal pulses 2+. Skin: Warm and dry. Musculoskeletal: No kyphosis. Neuropsychiatric: Alert and  oriented x3, affect grossly appropriate.  Prior Cardiac Testing/Procedures Stress test "ears ago." Lab Results  Basic Metabolic Panel:  Recent Labs Lab 10/24/14 2037 10/25/14 0407  NA 140 143  K 4.0 4.5  CL 106 111  CO2 19 20  GLUCOSE 145* 96  BUN 28* 24*  CREATININE 1.31* 1.21*  CALCIUM 9.4 8.8    Liver Function Tests:  Recent Labs Lab 10/24/14 2037  AST 16  ALT 17  ALKPHOS 74  BILITOT 0.2*  PROT 7.8  ALBUMIN 3.6    CBC:  Recent Labs Lab 10/24/14 2037 10/25/14 0407  WBC 12.1* 10.0  NEUTROABS 7.2  --   HGB 11.4* 10.1*  HCT 35.9* 32.3*  MCV 84.9 85.4  PLT 304 282    Cardiac Enzymes:  Recent Labs Lab 10/24/14 2037 10/24/14 2236 10/25/14 0407  TROPONINI <0.30 <0.30 <0.30     Radiology: Dg Chest Portable 1 View  10/24/2014   CLINICAL DATA:  Medial on left-sided chest pressure since yesterday. Pain more severe tonight.  EXAM: PORTABLE CHEST - 1 VIEW  COMPARISON:  04/13/2008  FINDINGS: Shallow inspiration. The heart size and mediastinal contours are within normal limits. Both lungs are clear. The visualized skeletal structures are unremarkable.  IMPRESSION: No active disease.   Electronically Signed   By: Lucienne Capers M.D.   On: 10/24/2014 21:07     ECG: NSR with non-specific T-wave abnormality in the anterior leads.   Impression and Recommendations  1.Chest pain: Worrisome for unstable angina in a 58 y/o woman with multiple CVRF's. She has negative troponin but abnormal EKG with ST abnormalities in the anterior leads. Pain described as pressure radiating to the left side and into her back. Worsening fatigue and exertional dyspnea over the last 2 months. She will benefit from ischemic testing either by stress test or cardiac cath. With multiple CVRF and symptoms, I would favor cardiac cath, but will discuss with Dr. Harl Bowie first. Continue nitrates as they are controlling pain. Not on BB currently. HR is in the 50's-60's at rest.   2.  Hypertension: Has been on ACE inhibitor with HCTZ. Potassium is 4.5. Creatinine is 1.25. BP is controlled currently.Has a chronic cough, but states that she had this before ACE was started.    3. Hyperholesterolemia: On statin, LDL 100, TC 177, TG 235.   4. Obesity: Increased exercise and low calorie diet is recommended once cardiac status is determined.      Signed: Phill Myron. Lawrence NP  10/25/2014, 10:11 AM Co-Sign MD  Patient seen and discussed with NP Purcell Nails, I agree with her documentation. 58 yo female hx of HTN, HL, and strong FH of CAD (mother CABG and AVR in her 50s, father died of MI age 38, brother coronary stent age 80) admitted with chest pain. Describes 6/10 midchest pressure radiating to left arm starting Tuesday afternoon while at rest. No other associated symptoms, pains lasted approx 1 minute. Since that time has had intermittent episodes increasing  in frequency and severity. Reports a few month history of increased DOE, now with walking up short distances, most pronounced with walking up inclines. Upon presentation chest pain responded to SL NG, no pain since last night now that she has NG patch on.   Trop neg x 3, K 4.0, Cr 1.31, GFR 43, WBC 12.1, Hgb 11.4, Plt 304, TC 177, TG 235, HDL 30, LDL 100 CXR no acute process EKG SR, TWI anterior precordial leads   Patient with multiple CAD risk factors including HTN, HL, and strong FH of CAD as outlined above. Symptoms described are concerning for accelerating angina. Negative enzymes thus far, EKG non-specific TWI anterior leads, repeat EKG pending. She also has a systolic murmur RUSB suggestive of AS, echo is pending. Given presentation, concern for unstable angina. Her TIMI score is 3 (risk factors, ASA, episodes of angina) suggestive of increased risk,  given the progression of symptoms will initiate anticoagulation and plan for transfer to [facility] for diagnostic [facility].  Her pretest probability of symptomatic obstructive  CAD is high and would not be significantly decreased with noninvasive testing. Change to high dose statin, no beta at this time as HR's are in mid 50s. Prelim echo read shows normal LVEF with no significant WMAs, aortic valve thickening without significant stenosis.   Zandra Abts MD

## 2014-10-25 NOTE — Plan of Care (Signed)
Problem: Consults Goal: Chest Pain Patient Education (See Patient Education module for education specifics.)  Outcome: Completed/Met Date Met:  10/25/14 Goal: Skin Care Protocol Initiated - if Braden Score 18 or less If consults are not indicated, leave blank or document N/A  Outcome: Not Applicable Date Met:  12/78/71 Goal: Tobacco Cessation referral if indicated Outcome: Not Applicable Date Met:  83/67/25 Goal: Nutrition Consult-if indicated Outcome: Not Applicable Date Met:  50/01/64 Goal: Diabetes Guidelines if Diabetic/Glucose > 140 If diabetic or lab glucose is > 140 mg/dl - Initiate Diabetes/Hyperglycemia Guidelines & Document Interventions  Outcome: Not Applicable Date Met:  29/03/79

## 2014-10-26 ENCOUNTER — Encounter (HOSPITAL_COMMUNITY)
Admission: EM | Disposition: A | Discharge status: Home / Self Care | Payer: Self-pay | Source: Home / Self Care | Attending: Cardiothoracic Surgery

## 2014-10-26 ENCOUNTER — Inpatient Hospital Stay (HOSPITAL_COMMUNITY): Payer: No Typology Code available for payment source

## 2014-10-26 ENCOUNTER — Encounter (HOSPITAL_COMMUNITY): Payer: Self-pay | Admitting: Cardiology

## 2014-10-26 ENCOUNTER — Other Ambulatory Visit: Payer: Self-pay | Admitting: *Deleted

## 2014-10-26 DIAGNOSIS — I25110 Atherosclerotic heart disease of native coronary artery with unstable angina pectoris: Secondary | ICD-10-CM

## 2014-10-26 DIAGNOSIS — I2510 Atherosclerotic heart disease of native coronary artery without angina pectoris: Secondary | ICD-10-CM

## 2014-10-26 DIAGNOSIS — E119 Type 2 diabetes mellitus without complications: Secondary | ICD-10-CM

## 2014-10-26 DIAGNOSIS — Z01810 Encounter for preprocedural cardiovascular examination: Secondary | ICD-10-CM

## 2014-10-26 DIAGNOSIS — E1169 Type 2 diabetes mellitus with other specified complication: Secondary | ICD-10-CM

## 2014-10-26 HISTORY — PX: LEFT HEART CATHETERIZATION WITH CORONARY ANGIOGRAM: SHX5451

## 2014-10-26 LAB — PULMONARY FUNCTION TEST
DL/VA % pred: 92 %
DL/VA: 4.09 ml/min/mmHg/L
DLCO cor % pred: 92 %
DLCO cor: 18.74 ml/min/mmHg
DLCO unc % pred: 81 %
DLCO unc: 16.52 ml/min/mmHg
FEF 25-75 Post: 3.71 L/sec
FEF 25-75 Pre: 2.76 L/sec
FEF2575-%Change-Post: 34 %
FEF2575-%Pred-Post: 171 %
FEF2575-%Pred-Pre: 127 %
FEV1-%Change-Post: 7 %
FEV1-%Pred-Post: 126 %
FEV1-%Pred-Pre: 117 %
FEV1-Post: 2.88 L
FEV1-Pre: 2.68 L
FEV1FVC-%Change-Post: 0 %
FEV1FVC-%Pred-Pre: 105 %
FEV6-%Change-Post: 7 %
FEV6-%Pred-Post: 123 %
FEV6-%Pred-Pre: 114 %
FEV6-Post: 3.49 L
FEV6-Pre: 3.25 L
FEV6FVC-%Change-Post: 0 %
FEV6FVC-%Pred-Post: 104 %
FEV6FVC-%Pred-Pre: 104 %
FVC-%Change-Post: 7 %
FVC-%Pred-Post: 119 %
FVC-%Pred-Pre: 110 %
FVC-Post: 3.5 L
FVC-Pre: 3.26 L
Post FEV1/FVC ratio: 82 %
Post FEV6/FVC ratio: 100 %
Pre FEV1/FVC ratio: 82 %
Pre FEV6/FVC Ratio: 100 %
RV % pred: 95 %
RV: 1.77 L
TLC % pred: 109 %
TLC: 5.04 L

## 2014-10-26 LAB — SURGICAL PCR SCREEN
MRSA, PCR: NEGATIVE
Staphylococcus aureus: POSITIVE — AB

## 2014-10-26 LAB — URINALYSIS, ROUTINE W REFLEX MICROSCOPIC
Bilirubin Urine: NEGATIVE
Glucose, UA: NEGATIVE mg/dL
Hgb urine dipstick: NEGATIVE
Ketones, ur: NEGATIVE mg/dL
Leukocytes, UA: NEGATIVE
Nitrite: NEGATIVE
Protein, ur: NEGATIVE mg/dL
Specific Gravity, Urine: 1.041 — ABNORMAL HIGH (ref 1.005–1.030)
Urobilinogen, UA: 0.2 mg/dL (ref 0.0–1.0)
pH: 5 (ref 5.0–8.0)

## 2014-10-26 LAB — BASIC METABOLIC PANEL
Anion gap: 15 (ref 5–15)
BUN: 21 mg/dL (ref 6–23)
CALCIUM: 9 mg/dL (ref 8.4–10.5)
CO2: 19 mEq/L (ref 19–32)
CREATININE: 1.14 mg/dL — AB (ref 0.50–1.10)
Chloride: 105 mEq/L (ref 96–112)
GFR calc Af Amer: 59 mL/min — ABNORMAL LOW (ref >90)
GFR, EST NON AFRICAN AMERICAN: 51 mL/min — AB (ref >90)
GLUCOSE: 103 mg/dL — AB (ref 70–99)
Potassium: 4.5 mEq/L (ref 3.7–5.3)
SODIUM: 139 meq/L (ref 137–147)

## 2014-10-26 LAB — IRON AND TIBC
IRON: 70 ug/dL (ref 42–135)
Saturation Ratios: 28 % (ref 20–55)
TIBC: 253 ug/dL (ref 250–470)
UIBC: 183 ug/dL (ref 125–400)

## 2014-10-26 LAB — RETICULOCYTES
RBC.: 3.98 MIL/uL (ref 3.87–5.11)
RETIC COUNT ABSOLUTE: 59.7 10*3/uL (ref 19.0–186.0)
RETIC CT PCT: 1.5 % (ref 0.4–3.1)

## 2014-10-26 LAB — GLUCOSE, CAPILLARY: GLUCOSE-CAPILLARY: 112 mg/dL — AB (ref 70–99)

## 2014-10-26 LAB — FOLATE: Folate: 14.4 ng/mL

## 2014-10-26 LAB — FERRITIN: FERRITIN: 73 ng/mL (ref 10–291)

## 2014-10-26 LAB — VITAMIN B12: Vitamin B-12: 283 pg/mL (ref 211–911)

## 2014-10-26 SURGERY — LEFT HEART CATHETERIZATION WITH CORONARY ANGIOGRAM
Anesthesia: LOCAL

## 2014-10-26 MED ORDER — ALBUTEROL SULFATE (2.5 MG/3ML) 0.083% IN NEBU
2.5000 mg | INHALATION_SOLUTION | Freq: Once | RESPIRATORY_TRACT | Status: AC
  Administered 2014-10-26: 2.5 mg via RESPIRATORY_TRACT

## 2014-10-26 MED ORDER — HEPARIN (PORCINE) IN NACL 100-0.45 UNIT/ML-% IJ SOLN
1450.0000 [IU]/h | INTRAMUSCULAR | Status: DC
  Administered 2014-10-26: 1050 [IU]/h via INTRAVENOUS
  Administered 2014-10-27: 1500 [IU]/h via INTRAVENOUS
  Administered 2014-10-28: 1450 [IU]/h via INTRAVENOUS
  Filled 2014-10-26 (×7): qty 250

## 2014-10-26 MED ORDER — ONDANSETRON HCL 4 MG/2ML IJ SOLN: 4.0000 mg | Freq: Four times a day (QID) | INTRAMUSCULAR | Status: DC | PRN

## 2014-10-26 MED ORDER — SODIUM CHLORIDE 0.9 % IV SOLN
INTRAVENOUS | Status: DC
  Administered 2014-10-26: 11:00:00 via INTRAVENOUS

## 2014-10-26 MED ORDER — METOPROLOL TARTRATE 12.5 MG HALF TABLET
12.5000 mg | ORAL_TABLET | Freq: Two times a day (BID) | ORAL | Status: DC
  Administered 2014-10-26 – 2014-10-28 (×6): 12.5 mg via ORAL
  Filled 2014-10-26 (×8): qty 1

## 2014-10-26 MED ORDER — ACETAMINOPHEN 325 MG PO TABS
650.0000 mg | ORAL_TABLET | ORAL | Status: DC | PRN
  Administered 2014-10-26 – 2014-10-29 (×3): 650 mg via ORAL
  Filled 2014-10-26 (×3): qty 2

## 2014-10-26 NOTE — CV Procedure (Addendum)
Karina Green is a 58 y.o. female   [id number]  [id number] LOCATION:  FACILITY: [facility]  PHYSICIAN: Troy Sine, MD, [facility] January 14, 1956   DATE OF PROCEDURE:  10/26/2014     CARDIAC CATHETERIZATION    HISTORY:   [name] is a 58 year old female with a history of obesity, hypertension, hyperlipidemia, and strong family history for coronary artery disease.  She has experienced increasing symptoms of exertional shortness of breath and chest pressure. She had recently presented to the emergency room  and had negative troponins and T-wave inversion anteriorly.  Her pain was nitrate responsive.  She is now referred for cardiac catheterization.   PROCEDURE: Left heartcatheterization via the right radial approach: Coronary angiography, selective intracoronary nitroglycerin administration,left ventriculography  The patient was brought to the second floor [facility] cath lab in the fasting state. The patient was premedicated with Versed 2 mg and fentanyl 50 mcg. A right radial approach was utilized after an Allen's test verified adequate circulation. The right radial artery was punctured via the Seldinger technique, and a 6 [place] Glidesheath Slender was inserted without difficulty.  A radial cocktail consisting of Verapamil, IV nitroglycerin, and lidocaine was administered. Weight adjusted heparin was administered. A safety J wire was advanced into the ascending aorta. Diagnostic catheterization was done with 5 [place] TIG 4.0 catheters. A 5 French pigtail catheter was used for left ventriculography. A TR radial band was applied for hemostasis. The patient left the catheterization laboratory in stable condition.   HEMODYNAMICS:   Central Aorta: 140/66  Left Ventricle: 140/18  ANGIOGRAPHY:   The left main coronary artery was a large-caliber vessel which bifurcated into the LAD and left circumflex coronary artery.  There was a 70% distal left main stenosis prior to its bifurcation with a  suggestion of a focal napkin ring like appearance. This did not improve following IC nitroglycerin administration.  The LAD was angiographically normal and gave rise to 2 major diagonal vessels and several septal perforating arteries. The vessel extended to the LV apex.   The left circumflex coronary artery was angiographically normal and gave rise to one major bifurcating obtuse marginal branch.   The RCA was angiographically normal it gave rise to a  PDA and PLA vessel.   Left ventriculography revealed normal global LV contractility without focal segmental wall motion abnormalities. There was no evidence for mitral regurgitation. Ejection fraction was 60%.   Total contrast used: 85 cc Omnipaque  IMPRESSION:  Significant coronary obstructive disease with 70% distal left main stenosis.  Normal LV function.   RECOMMENDATION:  Surgical consultation for consideration for CABG.   Troy Sine, MD, [facility] 10/26/2014 8:51 AM

## 2014-10-26 NOTE — Consult Note (Signed)
[address]       [place] [address]             [phone]        Karina Green [place] Medical Record #[id number] Date of Birth: Jun 09, 1956  Referring: No ref. provider found Primary Care: Leonides Grills, MD  Chief Complaint:    Chief Complaint  Patient presents with  . Chest Pain  patient examined, coronary angiogram and echocardiogram reviewed  History of Present Illness:      Very pleasant 58 year old Caucasian female nonsmoker admitted with unstable angina for 48 hours duration with increasing intensity and frequency. Cardiac enzymes on presentation to the emergency part were negative. She has a strong family history of coronary disease-several members have had CABG. She has hypertension, hyperlipidemia, obesity, and remote smoking history. Echocardiogram demonstrates good LV function with mild aortic stenosis. Coronary angiogram demonstrates 75% left main stenosis. Her cardiologist recommended CABG which I agree  as the best long-term therapy for her CAD. She underwent cardiac catheterization via the right radial artery. She has been stable today without chest pain. 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 She is reported to be an excellent Cook by her family and friends    Zubrod Score: At the time of surgery this patient's most appropriate activity status/level should be described as: []     0    Normal activity, no symptoms []     1    Restricted in physical strenuous activity but ambulatory, able to do out light work [x]     2    Ambulatory and capable of self care, unable to do work activities, up and about                 more than 50%  Of the time                            []     3    Only limited self care, in bed greater than 50% of waking hours []     4    Completely disabled, no self care, confined to bed or chair []     5    Moribund  Past Medical History    Diagnosis Date  . Hypertension   . Back pain   . Arthritis   . Hyperlipidemia   . CAD (coronary artery disease), native coronary artery-70% LM stenosis 10/25/2014  . DM type 2 (diabetes mellitus, type 2) 10/25/14    Past Surgical History  Procedure Laterality Date  . Tonsillectomy    . Bladder tack    . Total hip arthroplasty    . Hernia repair    . Cholecystectomy    . Abdominal hysterectomy    . Appendectomy    . Cardiac catheterization  10/25/14    70% LM stensis, normal EF    History  Smoking status  . Never Smoker   Smokeless tobacco  . Not on file    History  Alcohol Use No    History   Social History  . Marital Status: Divorced    Spouse Name: N/A    Number of Children: N/A  . Years of Education: N/A   Occupational History  . Not on file.   Social History Main Topics  . Smoking status: Never Smoker   . Smokeless tobacco: Not on file  .  Alcohol Use: No  . Drug Use: No  . Sexual Activity: Not on file   Other Topics Concern  . Not on file   Social History Narrative    No Known Allergies  Current Facility-Administered Medications  Medication Dose Route Frequency Provider Last Rate Last Dose  . 0.9 %  sodium chloride infusion  250 mL Intravenous PRN Theressa Millard, MD      . 0.9 %  sodium chloride infusion   Intravenous Continuous Troy Sine, MD 150 mL/hr at 10/26/14 1038    . acetaminophen (TYLENOL) tablet 650 mg  650 mg Oral Q6H PRN Theressa Millard, MD       Or  . acetaminophen (TYLENOL) suppository 650 mg  650 mg Rectal Q6H PRN Theressa Millard, MD      . acetaminophen (TYLENOL) tablet 650 mg  650 mg Oral Q4H PRN Troy Sine, MD   650 mg at 10/26/14 0936  . ALPRAZolam Duanne Moron) tablet 0.5 mg  0.5 mg Oral BID PRN Theressa Millard, MD      . alum & mag hydroxide-simeth (MAALOX/MYLANTA) 200-200-20 MG/5ML suspension 30 mL  30 mL Oral Q6H PRN Theressa Millard, MD      . aspirin tablet 325 mg  325 mg Oral Daily Theressa Millard, MD    325 mg at 10/26/14 1038  . atorvastatin (LIPITOR) tablet 80 mg  80 mg Oral q1800 Arnoldo Lenis, MD   80 mg at 10/25/14 1736  . heparin ADULT infusion 100 units/mL (25000 units/250 mL)  1,050 Units/hr Intravenous Continuous Patsey Berthold [place], RPH      . hydrochlorothiazide (HYDRODIURIL) tablet 25 mg  25 mg Oral Daily Theressa Millard, MD   25 mg at 10/26/14 1038  . HYDROmorphone (DILAUDID) injection 0.5-1 mg  0.5-1 mg Intravenous Q3H PRN Theressa Millard, MD      . lisinopril (PRINIVIL,ZESTRIL) tablet 20 mg  20 mg Oral Daily Theressa Millard, MD   20 mg at 10/26/14 1038  . metoprolol tartrate (LOPRESSOR) tablet 12.5 mg  12.5 mg Oral BID Isaiah Serge, NP   12.5 mg at 10/26/14 1138  . nitroGLYCERIN (NITROGLYN) 2 % ointment 0.5 inch  0.5 inch Topical 4 times per day Theressa Millard, MD   0.5 inch at 10/26/14 1138  . ondansetron (ZOFRAN) tablet 4 mg  4 mg Oral Q6H PRN Theressa Millard, MD       Or  . ondansetron (ZOFRAN) injection 4 mg  4 mg Intravenous Q6H PRN Theressa Millard, MD      . ondansetron [facility]) injection 4 mg  4 mg Intravenous Q6H PRN Troy Sine, MD      . oxyCODONE (Oxy IR/ROXICODONE) immediate release tablet 5 mg  5 mg Oral Q4H PRN Theressa Millard, MD      . sodium chloride 0.9 % injection 3 mL  3 mL Intravenous Q12H Theressa Millard, MD   0 mL at 10/24/14 2315  . sodium chloride 0.9 % injection 3 mL  3 mL Intravenous Q12H Theressa Millard, MD   3 mL at 10/25/14 1208  . sodium chloride 0.9 % injection 3 mL  3 mL Intravenous PRN Theressa Millard, MD        Prescriptions prior to admission  Medication Sig Dispense Refill Last Dose  . ALPRAZolam (XANAX) 0.5 MG tablet Take 0.5 mg by mouth daily as needed. Panic attacks/anxiety.   unknown at Unknown time  . hydrochlorothiazide (  HYDRODIURIL) 25 MG tablet Take 25 mg by mouth daily.   10/24/2014 at Unknown time  . ibuprofen (ADVIL,MOTRIN) 200 MG tablet Take 400 mg by mouth 2 (two) times daily as needed.  For pain   unknown  . lisinopril (PRINIVIL,ZESTRIL) 20 MG tablet Take 20 mg by mouth daily.   10/24/2014 at Unknown time  . simvastatin (ZOCOR) 20 MG tablet Take 20 mg by mouth daily.   10/24/2014 at Unknown time  . acetaminophen-codeine (TYLENOL #3) 300-30 MG per tablet Take 1-2 tablets by mouth every 6 (six) hours as needed. (Patient not taking: Reported on 10/24/2014) 20 tablet 0   . HYDROcodone-acetaminophen (NORCO) 5-325 MG per tablet Take 2 tablets by mouth every 4 (four) hours as needed for pain. (Patient not taking: Reported on 10/24/2014) 15 tablet 0   . indomethacin (INDOCIN) 25 MG capsule Take 1 capsule (25 mg total) by mouth 3 (three) times daily as needed. (Patient not taking: Reported on 10/24/2014) 15 capsule 0   . sulfamethoxazole-trimethoprim (SEPTRA DS) 800-160 MG per tablet Take 1 tablet by mouth 2 (two) times daily. (Patient not taking: Reported on 10/24/2014) 28 tablet 0 Completed Course at Unknown time    Family History  Problem Relation Age of Onset  . Hypertension Mother   . Hypertension Father   . Coronary artery disease Mother     CABG  . CAD Father     CABG  . Coronary artery disease Brother     Stent     Review of Systems:    The patient has multiple previous surgical procedures without anesthetic difficulty or bleeding problems. She does not require blood transfusion. The patient is right-hand dominant. Her preoperative carotid Doppler studies are negative. Her preoperative arm studies are positive Allen's test bilaterally She denies any history DVT or varicose veins or leg injuries. She denies any history of chest trauma, rib fracture, pneumothorax. No history of recent upper respiratory illness symptoms    Cardiac Review of Systems: Y or N  Chest Pain [ yes   ]  Resting SOB [ no  ] Exertional SOB  [ yes ]  Orthopnea [ yes ]   Pedal Edema [no   ]    Palpitations [  ] Syncope  [ nono ]   Presyncope [ no  ]  General Review of Systems: [Y] = yes [   ]=no Constitional: recent weight change [  ]; anorexia [  ]; fatigue [  ]; nausea [  ]; night sweats [  ]; fever [  ]; or chills [  ]                                                               Dental: poor dentition[  ]; Last Dentist visit:every 6 months   Eye : blurred vision [  ]; diplopia [   ]; vision changes [  ];  Amaurosis fugax[  ]; Resp: cough [  ];  wheezing[  ];  hemoptysis[  ]; shortness of breath[  ]; paroxysmal nocturnal dyspnea[  ]; dyspnea on exertion[  ]; or orthopnea[  ];  GI:  gallstones[  ], vomiting[  ];  dysphagia[  ]; melena[  ];  hematochezia [  ]; heartburn[  ];   Hx of  Colonoscopy[  ];  GU: kidney stones [  ]; hematuria[  ];   dysuria [  ];  nocturia[  ];  history of     obstruction [  ]; urinary frequency [  ]             Skin: rash, swelling[  ];, hair loss[  ];  peripheral edema[  ];  or itching[  ]; Musculosketetal: myalgias[  ];  joint swelling[  ];  joint erythema[  ];  joint pain[  ];  back pain[  ];  Heme/Lymph: bruising[  ];  bleeding[  ];  anemia[  ];  Neuro: TIA[  ];  headaches[  ];  stroke[  ];  vertigo[  ];  seizures[  ];   paresthesias[  ];  difficulty walking[  ];  Psych:depression[  ]; anxiety[  ];  Endocrine: diabetes[ no-she watches her diet, carbohydrates ];  thyroid dysfunction[  ];  Immunizations: Flu [  ]; Pneumococcal[  ];  Other:  Physical Exam: BP 124/62 mmHg  Pulse 76  Temp(Src) 97.6 F (36.4 C) (Oral)  Resp 18  Ht 5\' 3"  (1.6 m)  Wt 216 lb 0.8 oz (98 kg)  BMI 38.28 kg/m2  SpO2 98%  Gen. appearance-middle-aged obese female no acute distres HEENT-normocephalic pupils equal dentition adequate Neck-no JVD mass or bruit Lymphatics-no palpable adenopathy in the neck Thorax-no tenderness or deformity, breath sounds clear and equal bilaterally Cardiac-regular rhythm, no gallop, soft systolic murmur Abdomen-obese nontender with several surgical scars no palpable pulsatile mass extremities-no varicosities or venous insufficiency of the  lower extremities, pulses present in all extremities Neuro-alert and oriented no focal motor deficit  Diagnostic Studies & Laboratory data:     Recent Radiology Findings:   Dg Chest Portable 1 View  10/24/2014   CLINICAL DATA:  Medial on left-sided chest pressure since yesterday. Pain more severe tonight.  EXAM: PORTABLE CHEST - 1 VIEW  COMPARISON:  04/13/2008  FINDINGS: Shallow inspiration. The heart size and mediastinal contours are within normal limits. Both lungs are clear. The visualized skeletal structures are unremarkable.  IMPRESSION: No active disease.   Electronically Signed   By: Lucienne Capers M.D.   On: 10/24/2014 21:07      Recent Lab Findings: Lab Results  Component Value Date   WBC 10.0 10/25/2014   HGB 10.1* 10/25/2014   HCT 32.3* 10/25/2014   PLT 282 10/25/2014   GLUCOSE 103* 10/26/2014   CHOL 177 10/25/2014   TRIG 235* 10/25/2014   HDL 30* 10/25/2014   LDLCALC 100* 10/25/2014   ALT 17 10/24/2014   AST 16 10/24/2014   NA 139 10/26/2014   K 4.5 10/26/2014   CL 105 10/26/2014   CREATININE 1.14* 10/26/2014   BUN 21 10/26/2014   CO2 19 10/26/2014   INR 1.12 10/25/2014   HGBA1C 6.6* 10/25/2014      Assessment / Plan:     Very nice 58 year old female presents with unstable angina and has been found to have a significant left main stenosis with preserved LV function. CABG has been recommended for her best long-term treatment. Her obesity and associated pulmonary risk are the most significant risk factors for surgery. She understands the reasons for CABG, the alternatives, and the risks involved and agrees to proceed with surgery Monday, November 9.       @ME1 @ 10/26/2014 4:53 PM

## 2014-10-26 NOTE — Progress Notes (Signed)
Pre-op Cardiac Surgery  Carotid Findings:  Bilateral:  1-39% ICA stenosis.  Vertebral artery flow is antegrade.  Right ECA stenosis noted, and borderline Left ECA stenosis.   Upper Extremity Right Left  Brachial Pressures 126 N/A- IV at upper arm  Radial Waveforms Tri Tri  Ulnar Waveforms Tri Tri  Palmar Arch (Allen's Test) Decreases >50% with radial compression, normal with ulnar compression Decreases >50% with radial compression, normal with ulnar compression   Palpable pedal pulses.  Landry Mellow, RDMS, RVT 10/26/2014

## 2014-10-26 NOTE — H&P (View-Only) (Signed)
CARDIOLOGY CONSULT NOTE   Patient ID: Karina Green MRN: [id number] DOB/AGE: [birth year] 58 y.o.  Admit Date: 10/24/2014 Referring Physician: PTH Primary Physician: Leonides Grills, MD Consulting Cardiologist: Carlyle Dolly MD Primary Cardiologist -New  Reason for Consultation: Chest Pain  Clinical Summary Karina Green is a 58 y.o.femalewith no prior cardiac history but multiple CVRFs to include obesity, strong FH, Hypertension, Hypercholesterolemia, who presents with 48 hour history of intermittent chest pain, coming and going at rest. Described as pressure from midsternal area to the left shoulder and into the back. Lasting a few minutes at a time. Pain continued on and off over the last 2 days with increasing intensity. She has also noticed over the last couple of months that her energy level is decreasing and worsening dyspnea on exertion, i.e.walking up an incline, that is new for her. She cares fro her disabled niece and has noticed that just making her bed causes more fatigue than usual.   On presentation to ER, she was found to 155/98 with HR of 74 bpm. O2 Sat 98%. Slightly  elevated WBC 12.1, Glucose 145. Creatinine 1.31. GFR 50. CXR negative for active disease. Troponin negative X 3. EKG NSR with T-wave inversion in the anterior leads (questionable biphasic T-wave V2) She was given NTG SL: with relieve of pain and now has NTG paste topically. No further chest pain.   States that her brother is an Printmaker and spends hours at the gym, Has CAD requiring a stent.   No Known Allergies  Medications Scheduled Medications: . aspirin  325 mg Oral Daily  . enoxaparin (LOVENOX) injection  40 mg Subcutaneous Q24H  . hydrochlorothiazide  25 mg Oral Daily  . lisinopril  20 mg Oral Daily  . nitroGLYCERIN  0.5 inch Topical 4 times per day  . simvastatin  20 mg Oral Daily  . sodium chloride  3 mL Intravenous Q12H  . sodium chloride  3 mL Intravenous Q12H       PRN  Medications: sodium chloride, acetaminophen **OR** acetaminophen, ALPRAZolam, alum & mag hydroxide-simeth, HYDROmorphone (DILAUDID) injection, ondansetron **OR** ondansetron (ZOFRAN) IV, oxyCODONE, sodium chloride   Past Medical History  Diagnosis Date  . Hypertension   . Back pain   . Arthritis   . Hyperlipidemia     Past Surgical History  Procedure Laterality Date  . Tonsillectomy    . Bladder tack    . Total hip arthroplasty    . Hernia repair    . Cholecystectomy    . Abdominal hysterectomy    . Appendectomy      Family History  Problem Relation Age of Onset  . Hypertension Mother   . Hypertension Father   . Coronary artery disease Mother     CABG  . CAD Father     CABG  . Coronary artery disease Brother     Stent    Social History Karina Green reports that she has never smoked. She does not have any smokeless tobacco history on file. Karina Green reports that she does not drink alcohol.  Review of Systems Otherwise reviewed and negative except as outlined.  Physical Examination Blood pressure 126/66, pulse 54, temperature 98.1 F (36.7 C), temperature source Oral, resp. rate 18, height 5\' 3"  (1.6 m), weight 191 lb 9.3 oz (86.9 kg), SpO2 98 %.  Intake/Output Summary (Last 24 hours) at 10/25/14 1011 Last data filed at 10/24/14 2313  Gross per 24 hour  Intake    240 ml  Output  0 ml  Net    240 ml    Telemetry: NSR  GEN: Resting without complaint. HEENT: Conjunctiva and lids normal, oropharynx clear with moist mucosa. Neck: Supple, no elevated JVP or carotid bruits, no thyromegaly. Lungs: Clear to auscultation, nonlabored breathing at rest. Cardiac: Regular rate and rhythm, no S3 or significant systolic murmur, no pericardial rub. Abdomen: Soft, nontender, no hepatomegaly, bowel sounds present, no guarding or rebound.Obese Extremities: No pitting edema, distal pulses 2+. Skin: Warm and dry. Musculoskeletal: No kyphosis. Neuropsychiatric: Alert and  oriented x3, affect grossly appropriate.  Prior Cardiac Testing/Procedures Stress test "ears ago." Lab Results  Basic Metabolic Panel:  Recent Labs Lab 10/24/14 2037 10/25/14 0407  NA 140 143  K 4.0 4.5  CL 106 111  CO2 19 20  GLUCOSE 145* 96  BUN 28* 24*  CREATININE 1.31* 1.21*  CALCIUM 9.4 8.8    Liver Function Tests:  Recent Labs Lab 10/24/14 2037  AST 16  ALT 17  ALKPHOS 74  BILITOT 0.2*  PROT 7.8  ALBUMIN 3.6    CBC:  Recent Labs Lab 10/24/14 2037 10/25/14 0407  WBC 12.1* 10.0  NEUTROABS 7.2  --   HGB 11.4* 10.1*  HCT 35.9* 32.3*  MCV 84.9 85.4  PLT 304 282    Cardiac Enzymes:  Recent Labs Lab 10/24/14 2037 10/24/14 2236 10/25/14 0407  TROPONINI <0.30 <0.30 <0.30     Radiology: Dg Chest Portable 1 View  10/24/2014   CLINICAL DATA:  Medial on left-sided chest pressure since yesterday. Pain more severe tonight.  EXAM: PORTABLE CHEST - 1 VIEW  COMPARISON:  04/13/2008  FINDINGS: Shallow inspiration. The heart size and mediastinal contours are within normal limits. Both lungs are clear. The visualized skeletal structures are unremarkable.  IMPRESSION: No active disease.   Electronically Signed   By: Lucienne Capers M.D.   On: 10/24/2014 21:07     ECG: NSR with non-specific T-wave abnormality in the anterior leads.   Impression and Recommendations  1.Chest pain: Worrisome for unstable angina in a 58 y/o woman with multiple CVRF's. She has negative troponin but abnormal EKG with ST abnormalities in the anterior leads. Pain described as pressure radiating to the left side and into her back. Worsening fatigue and exertional dyspnea over the last 2 months. She will benefit from ischemic testing either by stress test or cardiac cath. With multiple CVRF and symptoms, I would favor cardiac cath, but will discuss with Dr. Harl Bowie first. Continue nitrates as they are controlling pain. Not on BB currently. HR is in the 50's-60's at rest.   2.  Hypertension: Has been on ACE inhibitor with HCTZ. Potassium is 4.5. Creatinine is 1.25. BP is controlled currently.Has a chronic cough, but states that she had this before ACE was started.    3. Hyperholesterolemia: On statin, LDL 100, TC 177, TG 235.   4. Obesity: Increased exercise and low calorie diet is recommended once cardiac status is determined.      Signed: Phill Myron. Lawrence NP  10/25/2014, 10:11 AM Co-Sign MD  Patient seen and discussed with NP Purcell Nails, I agree with her documentation. 58 yo female hx of HTN, HL, and strong FH of CAD (mother CABG and AVR in her 90s, father died of MI age 26, brother coronary stent age 63) admitted with chest pain. Describes 6/10 midchest pressure radiating to left arm starting Tuesday afternoon while at rest. No other associated symptoms, pains lasted approx 1 minute. Since that time has had intermittent episodes increasing  in frequency and severity. Reports a few month history of increased DOE, now with walking up short distances, most pronounced with walking up inclines. Upon presentation chest pain responded to SL NG, no pain since last night now that she has NG patch on.   Trop neg x 3, K 4.0, Cr 1.31, GFR 43, WBC 12.1, Hgb 11.4, Plt 304, TC 177, TG 235, HDL 30, LDL 100 CXR no acute process EKG SR, TWI anterior precordial leads   Patient with multiple CAD risk factors including HTN, HL, and strong FH of CAD as outlined above. Symptoms described are concerning for accelerating angina. Negative enzymes thus far, EKG non-specific TWI anterior leads, repeat EKG pending. She also has a systolic murmur RUSB suggestive of AS, echo is pending. Given presentation, concern for unstable angina. Her TIMI score is 3 (risk factors, ASA, episodes of angina) suggestive of increased risk,  given the progression of symptoms will initiate anticoagulation and plan for transfer to [facility] for diagnostic [facility].  Her pretest probability of symptomatic obstructive  CAD is high and would not be significantly decreased with noninvasive testing. Change to high dose statin, no beta at this time as HR's are in mid 50s. Prelim echo read shows normal LVEF with no significant WMAs, aortic valve thickening without significant stenosis.   Zandra Abts MD

## 2014-10-26 NOTE — Progress Notes (Signed)
Subjective: No pain  Objective: Vital signs in last 24 hours: Temp:  [97.6 F (36.4 C)-98.5 F (36.9 C)] 97.6 F (36.4 C) (11/06 0534) Pulse Rate:  [60-76] 76 (11/06 0911) Resp:  [18] 18 (11/06 0534) BP: (128-157)/(58-75) 157/75 mmHg (11/06 0902) SpO2:  [98 %-100 %] 98 % (11/06 0911) Weight:  [216 lb 0.8 oz (98 kg)-218 lb 11.1 oz (99.2 kg)] 216 lb 0.8 oz (98 kg) (11/06 0534) Weight change: -6 lb 4.9 oz (-2.859 kg) Last BM Date: 10/25/14 Intake/Output from previous day: +120 11/05 0701 - 11/06 0700 In: 120 [P.O.:120] Out: -  Intake/Output this shift:    PE: General:Pleasant affect, NAD Skin:Warm and dry, brisk capillary refill HEENT:normocephalic, sclera clear, mucus membranes moist Heart:S1S2 RRR without murmur, gallup, rub or click Lungs:clear without rales, rhonchi, or wheezes XBM:WUXL, non tender, + BS, do not palpate liver spleen or masses Ext:no lower ext edema, 2+ pedal pulses, 2+ radial pulses- clamp in place Neuro:alert and oriented, MAE, follows commands, + facial symmetry   Lab Results:  Recent Labs  10/24/14 2037 10/25/14 0407  WBC 12.1* 10.0  HGB 11.4* 10.1*  HCT 35.9* 32.3*  PLT 304 282   BMET  Recent Labs  10/24/14 2037 10/25/14 0407  NA 140 143  K 4.0 4.5  CL 106 111  CO2 19 20  GLUCOSE 145* 96  BUN 28* 24*  CREATININE 1.31* 1.21*  CALCIUM 9.4 8.8    Recent Labs  10/25/14 0407 10/25/14 1041  TROPONINI <0.30 <0.30    Lab Results  Component Value Date   CHOL 177 10/25/2014   HDL 30* 10/25/2014   LDLCALC 100* 10/25/2014   TRIG 235* 10/25/2014   CHOLHDL 5.9 10/25/2014   Lab Results  Component Value Date   HGBA1C 6.6* 10/25/2014     No results found for: TSH  Hepatic Function Panel  Recent Labs  10/24/14 2037  PROT 7.8  ALBUMIN 3.6  AST 16  ALT 17  ALKPHOS 74  BILITOT 0.2*    Recent Labs  10/25/14 0407  CHOL 177   No results for input(s): PROTIME in the last 72  hours.     Studies/Results: Dg Chest Portable 1 View  10/24/2014   CLINICAL DATA:  Medial on left-sided chest pressure since yesterday. Pain more severe tonight.  EXAM: PORTABLE CHEST - 1 VIEW  COMPARISON:  04/13/2008  FINDINGS: Shallow inspiration. The heart size and mediastinal contours are within normal limits. Both lungs are clear. The visualized skeletal structures are unremarkable.  IMPRESSION: No active disease.   Electronically Signed   By: Lucienne Capers M.D.   On: 10/24/2014 21:07    Medications: I have reviewed the patient's current medications. Scheduled Meds: . aspirin  325 mg Oral Daily  . atorvastatin  80 mg Oral q1800  . hydrochlorothiazide  25 mg Oral Daily  . lisinopril  20 mg Oral Daily  . nitroGLYCERIN  0.5 inch Topical 4 times per day  . sodium chloride  3 mL Intravenous Q12H  . sodium chloride  3 mL Intravenous Q12H   Continuous Infusions: . sodium chloride    . heparin Stopped (10/26/14 0643)   PRN Meds:.sodium chloride, acetaminophen **OR** acetaminophen, acetaminophen, ALPRAZolam, alum & mag hydroxide-simeth, HYDROmorphone (DILAUDID) injection, ondansetron **OR** ondansetron (ZOFRAN) IV, ondansetron (ZOFRAN) IV, oxyCODONE, sodium chloride  Assessment/Plan: 58 year old female with a history of obesity, hypertension, hyperlipidemia, and strong family history for coronary artery disease. She has experienced increasing symptoms of exertional shortness  of breath and chest pressure. She had recently presented to the emergency room and had negative troponins and T-wave inversion anteriorly. Her pain was nitrate responsive. She had cardiac catheterization revealing 70% LM stenosis and normal LV function.  Principal Problem:   Unstable angina- Nitrate responsive, negative MI, on NTG paste  Active Problems:   CAD (coronary artery disease), native coronary artery-70% LM stenosis-plan for CABG-Surgical consult    Essential hypertension- elevated this AM add low  dose BB    Hypercholesteremia- LDL 100 with goal 70 now.  On Lipitor 80 mg      CKD-3 will recheck BMP     Anemia check anemia panel      DM-2 with glycohgb of 6.6- Pt not aware of any diabetes- will place on diabetic diet and accu checks.    LOS: 2 days   Time spent with pt. : 15 minutes. [facility] R  Nurse Practitioner Certified Pager [phone] or after 5pm and on weekends call [phone] 10/26/2014, 9:15 AM

## 2014-10-26 NOTE — Plan of Care (Signed)
Problem: Phase I Progression Outcomes Goal: Hemodynamically stable Outcome: Completed/Met Date Met:  10/26/14 Goal: Anginal pain relieved Outcome: Completed/Met Date Met:  10/26/14 Goal: Voiding-avoid urinary catheter unless indicated Outcome: Completed/Met Date Met:  10/26/14

## 2014-10-26 NOTE — Interval H&P Note (Signed)
Cath Lab Visit (complete for each Cath Lab visit)  Clinical Evaluation Leading to the Procedure:   ACS: No.  Non-ACS:    Anginal Classification: CCS III  Anti-ischemic medical therapy: Minimal Therapy (1 class of medications)  Non-Invasive Test Results: No non-invasive testing performed  Prior CABG: No previous CABG      History and Physical Interval Note:  10/26/2014 7:52 AM  Karina Green  has presented today for surgery, with the diagnosis of cp  The various methods of treatment have been discussed with the patient and family. After consideration of risks, benefits and other options for treatment, the patient has consented to  Procedure(s): LEFT HEART CATHETERIZATION WITH CORONARY ANGIOGRAM (N/A) as a surgical intervention .  The patient's history has been reviewed, patient examined, no change in status, stable for surgery.  I have reviewed the patient's chart and labs.  Questions were answered to the patient's satisfaction.     Aubrii Sharpless A

## 2014-10-26 NOTE — Progress Notes (Signed)
ANTICOAGULATION CONSULT NOTE - Follow Up Consult  Pharmacy Consult for Heparin Indication: Severe CAD awaiting CABG consult  No Known Allergies  Patient Measurements: Height: 5\' 3"  (160 cm) Weight: 216 lb 0.8 oz (98 kg) IBW/kg (Calculated) : 52.4 Heparin Dosing Weight: 72kg  Vital Signs: Temp: 97.6 F (36.4 C) (11/06 0534) Temp Source: Oral (11/06 0534) BP: 124/62 mmHg (11/06 1132) Pulse Rate: 76 (11/06 1132)  Labs:  Recent Labs  10/24/14 2037 10/24/14 2236 10/25/14 0407 10/25/14 1041 10/25/14 1257 10/25/14 2024 10/26/14 1052  HGB 11.4*  --  10.1*  --   --   --   --   HCT 35.9*  --  32.3*  --   --   --   --   PLT 304  --  282  --   --   --   --   APTT  --   --   --   --  52*  --   --   LABPROT  --   --   --   --  14.6  --   --   INR  --   --   --   --  1.12  --   --   HEPARINUNFRC  --   --   --   --   --  <0.10*  --   CREATININE 1.31*  --  1.21*  --   --   --  1.14*  TROPONINI <0.30 <0.30 <0.30 <0.30  --   --   --     Estimated Creatinine Clearance: 58.5 mL/min (by C-G formula based on Cr of 1.14).  Assessment: 58yof s/p cath to restart heparin drip for severe CAD while awaiting TCTS consult for possible CABG. MD has ordered heparin to be restarted 8 hours post sheath/TR band removal - documented per RN ~1130. Patient was receiving heparin drip 1050 units/hr prior to cath - will restart at this rate @ 1930 and check 6 hour heparin level.  - H/H and Plts trending down - No significant bleeding reported, Cath site stable per RN  Goal of Therapy:  Heparin level 0.3-0.7 units/ml Monitor platelets by anticoagulation protocol: Yes   Plan:  1. Restart heparin drip 1050 units/hr (10.5 ml/hr) @ 1930 tonight 2. Check heparin level @ 0130 (11/7) 3. Daily heparin level and CBC 4. Follow-up TCTS plan  Earleen Newport  [phone] 10/26/2014,2:13 PM

## 2014-10-27 ENCOUNTER — Inpatient Hospital Stay (HOSPITAL_COMMUNITY): Payer: No Typology Code available for payment source

## 2014-10-27 LAB — BASIC METABOLIC PANEL
Anion gap: 15 (ref 5–15)
BUN: 21 mg/dL (ref 6–23)
CALCIUM: 8.8 mg/dL (ref 8.4–10.5)
CO2: 19 mEq/L (ref 19–32)
Chloride: 105 mEq/L (ref 96–112)
Creatinine, Ser: 1.23 mg/dL — ABNORMAL HIGH (ref 0.50–1.10)
GFR calc Af Amer: 54 mL/min — ABNORMAL LOW (ref >90)
GFR, EST NON AFRICAN AMERICAN: 47 mL/min — AB (ref >90)
Glucose, Bld: 101 mg/dL — ABNORMAL HIGH (ref 70–99)
POTASSIUM: 4.1 meq/L (ref 3.7–5.3)
Sodium: 139 mEq/L (ref 137–147)

## 2014-10-27 LAB — GLUCOSE, CAPILLARY
GLUCOSE-CAPILLARY: 100 mg/dL — AB (ref 70–99)
Glucose-Capillary: 112 mg/dL — ABNORMAL HIGH (ref 70–99)
Glucose-Capillary: 107 mg/dL — ABNORMAL HIGH (ref 70–99)
Glucose-Capillary: 125 mg/dL — ABNORMAL HIGH (ref 70–99)

## 2014-10-27 LAB — CBC
HCT: 32.7 % — ABNORMAL LOW (ref 36.0–46.0)
Hemoglobin: 10.2 g/dL — ABNORMAL LOW (ref 12.0–15.0)
MCH: 26.4 pg (ref 26.0–34.0)
MCHC: 31.2 g/dL (ref 30.0–36.0)
MCV: 84.7 fL (ref 78.0–100.0)
Platelets: 280 10*3/uL (ref 150–400)
RBC: 3.86 MIL/uL — ABNORMAL LOW (ref 3.87–5.11)
RDW: 15.2 % (ref 11.5–15.5)
WBC: 8.8 10*3/uL (ref 4.0–10.5)

## 2014-10-27 LAB — HEPARIN LEVEL (UNFRACTIONATED)
HEPARIN UNFRACTIONATED: 0.11 [IU]/mL — AB (ref 0.30–0.70)
Heparin Unfractionated: 0.2 IU/mL — ABNORMAL LOW (ref 0.30–0.70)

## 2014-10-27 MED ORDER — SODIUM CHLORIDE 0.9 % IV SOLN
INTRAVENOUS | Status: DC
  Administered 2014-10-27 – 2014-10-28 (×2): via INTRAVENOUS

## 2014-10-27 MED ORDER — HEPARIN BOLUS VIA INFUSION
1500.0000 [IU] | Freq: Once | INTRAVENOUS | Status: AC
  Administered 2014-10-27: 1500 [IU] via INTRAVENOUS
  Filled 2014-10-27: qty 1500

## 2014-10-27 NOTE — Progress Notes (Addendum)
ANTICOAGULATION CONSULT NOTE - Follow Up Consult  Pharmacy Consult for Heparin Indication: Severe CAD awaiting CABG consult  No Known Allergies  Patient Measurements: Height: 5\' 3"  (160 cm) Weight: 217 lb 12.8 oz (98.793 kg) IBW/kg (Calculated) : 52.4 Heparin Dosing Weight: 72kg  Vital Signs: Temp: 97.4 F (36.3 C) (11/07 1312) Temp Source: Oral (11/07 1312) BP: 99/49 mmHg (11/07 1312) Pulse Rate: 61 (11/07 1312)  Labs:  Recent Labs  10/24/14 2037 10/24/14 2236 10/25/14 0407 10/25/14 1041 10/25/14 1257 10/25/14 2024 10/26/14 1052 10/27/14 0415 10/27/14 1505  HGB 11.4*  --  10.1*  --   --   --   --  10.2*  --   HCT 35.9*  --  32.3*  --   --   --   --  32.7*  --   PLT 304  --  282  --   --   --   --  280  --   APTT  --   --   --   --  52*  --   --   --   --   LABPROT  --   --   --   --  14.6  --   --   --   --   INR  --   --   --   --  1.12  --   --   --   --   HEPARINUNFRC  --   --   --   --   --  <0.10*  --  0.20* 0.11*  CREATININE 1.31*  --  1.21*  --   --   --  1.14* 1.23*  --   TROPONINI <0.30 <0.30 <0.30 <0.30  --   --   --   --   --     Estimated Creatinine Clearance: 54.5 mL/min (by C-G formula based on Cr of 1.23).   Medications:  Heparin 1250 units/hr  Assessment: 58yof continuing on heparin s/p cath while awaiting CABG on 11/9. Heparin level (0.11) remains subtherapeutic despite rate increases - will rebolus, increase heparin drip and check follow-up heparin level.  - H/H and Plts stable - No significant bleeding reported - RN reported that pump has been beeping -  she tried to change IV to opposite arm but it infiltrated  Goal of Therapy:  Heparin level 0.3-0.7 units/ml Monitor platelets by anticoagulation protocol: Yes   Plan:  1. Heparin IV bolus 1500 units x 1 2. Increase heparin drip to 1450 units/hr 3. Check heparin level 6 hours after rate increase 4. Daily heparin level and CBC 5. CABG planned 11/9  Earleen Newport   [phone] 10/27/2014,5:29 PM

## 2014-10-27 NOTE — Progress Notes (Signed)
Cardiac Rehab Phase I  Reviewed pre-op education with pt.  Pt had already started to review booklet and was encouraged to watch video with family before Monday morning.  Reviewed importance of ISP use and not using arms and walking after surgery.  We will f/u post surgery when ready. Alberteen Sam, [place], ACSM RCEP [phone]

## 2014-10-27 NOTE — Progress Notes (Signed)
ANTICOAGULATION CONSULT NOTE - Follow Up Consult  Pharmacy Consult for heparin Indication: CAD awaiting CABG  Labs:  Recent Labs  10/24/14 2037 10/24/14 2236 10/25/14 0407 10/25/14 1041 10/25/14 1257 10/25/14 2024 10/26/14 1052 10/27/14 0415  HGB 11.4*  --  10.1*  --   --   --   --  10.2*  HCT 35.9*  --  32.3*  --   --   --   --  32.7*  PLT 304  --  282  --   --   --   --  280  APTT  --   --   --   --  52*  --   --   --   LABPROT  --   --   --   --  14.6  --   --   --   INR  --   --   --   --  1.12  --   --   --   HEPARINUNFRC  --   --   --   --   --  <0.10*  --  0.20*  CREATININE 1.31*  --  1.21*  --   --   --  1.14*  --   TROPONINI <0.30 <0.30 <0.30 <0.30  --   --   --   --     Assessment: 58yo female subtherapeutic on heparin after resumed while awaiting CABG Monday.  Goal of Therapy:  Heparin level 0.3-0.7 units/ml   Plan:  Will increase heparin gtt by 2 units/kg/hr to 1250 units/hr and check level in 6hr.  Wynona Neat, PharmD, BCPS  10/27/2014,6:28 AM

## 2014-10-27 NOTE — Plan of Care (Signed)
Problem: Phase I Progression Outcomes Goal: Aspirin unless contraindicated Outcome: Completed/Met Date Met:  10/27/14 Goal: MD aware of Cardiac Marker results Outcome: Completed/Met Date Met:  10/27/14  Problem: Phase II Progression Outcomes Goal: Hemodynamically stable Outcome: Completed/Met Date Met:  10/27/14 Goal: Anginal pain relieved Outcome: Completed/Met Date Met:  10/27/14 Goal: Stress Test if indicated Outcome: Completed/Met Date Met:  10/27/14 Goal: Cath/PCI Day Path if indicated Outcome: Completed/Met Date Met:  10/27/14 Goal: CV Risk Factors identified Outcome: Completed/Met Date Met:  10/27/14 Goal: Cardiac Rehab if ordered Outcome: Completed/Met Date Met:  10/27/14 Goal: If positive for MI, change to MI Path Outcome: Not Applicable Date Met:  50/15/86  Problem: Phase III Progression Outcomes Goal: Hemodynamically stable Outcome: Completed/Met Date Met:  10/27/14 Goal: No anginal pain Outcome: Completed/Met Date Met:  10/27/14 Goal: Cath/PCI Path as indicated Outcome: Completed/Met Date Met:  10/27/14 Goal: Vascular site scale level 0 - I Vascular Site Scale Level 0: No bruising/bleeding/hematoma Level I (Mild): Bruising/Ecchymosis, minimal bleeding/ooozing, palpable hematoma < 3 cm Level II (Moderate): Bleeding not affecting hemodynamic parameters, pseudoaneurysm, palpable hematoma > 3 cm Level III (Severe) Bleeding which affects hemodynamic parameters or retroperitoneal hemorrhage  Outcome: Completed/Met Date Met:  10/27/14 Goal: Discharge plan remains appropriate-arrangements made Outcome: Completed/Met Date Met:  10/27/14 Goal: Tolerating diet Outcome: Completed/Met Date Met:  10/27/14 Goal: If positive for MI, change to MI Path Outcome: Not Applicable Date Met:  82/57/49

## 2014-10-27 NOTE — Progress Notes (Signed)
Patient ID: Karina Green, female   DOB: [birth year], 58 y.o.   MRN: [id number]         Subjective: No pain  Objective: Vital signs in last 24 hours: Temp:  [97.6 F (36.4 C)-97.8 F (36.6 C)] 97.6 F (36.4 C) (11/07 0535) Pulse Rate:  [57-63] 57 (11/07 0535) Resp:  [18] 18 (11/07 0535) BP: (120-125)/(60-68) 120/68 mmHg (11/07 0535) SpO2:  [97 %-98 %] 98 % (11/07 0535) Weight:  [97.977 kg (216 lb)-98.793 kg (217 lb 12.8 oz)] 98.793 kg (217 lb 12.8 oz) (11/07 0535) Weight change: -1.223 kg (-2 lb 11.1 oz) Last BM Date: 10/27/14 Intake/Output from previous day: +120 11/06 0701 - 11/07 0700 In: 360 [P.O.:360] Out: 350 [Urine:350] Intake/Output this shift:    PE: General:Pleasant affect, NAD Skin:Warm and dry, brisk capillary refill HEENT:normocephalic, sclera clear, mucus membranes moist Heart:S1S2 RRR without murmur, gallup, rub or click Lungs:clear without rales, rhonchi, or wheezes DUK:GURK, non tender, + BS, do not palpate liver spleen or masses Ext:no lower ext edema, 2+ pedal pulses, 2+ radial pulses- clamp in place Neuro:alert and oriented, MAE, follows commands, + facial symmetry Right radial cath sight A    Lab Results:  Recent Labs  10/25/14 0407 10/27/14 0415  WBC 10.0 8.8  HGB 10.1* 10.2*  HCT 32.3* 32.7*  PLT 282 280   BMET  Recent Labs  10/26/14 1052 10/27/14 0415  NA 139 139  K 4.5 4.1  CL 105 105  CO2 19 19  GLUCOSE 103* 101*  BUN 21 21  CREATININE 1.14* 1.23*  CALCIUM 9.0 8.8    Recent Labs  10/25/14 0407 10/25/14 1041  TROPONINI <0.30 <0.30    Lab Results  Component Value Date   CHOL 177 10/25/2014   HDL 30* 10/25/2014   LDLCALC 100* 10/25/2014   TRIG 235* 10/25/2014   CHOLHDL 5.9 10/25/2014   Lab Results  Component Value Date   HGBA1C 6.6* 10/25/2014      Hepatic Function Panel  Recent Labs  10/24/14 2037  PROT 7.8  ALBUMIN 3.6  AST 16  ALT 17  ALKPHOS 74  BILITOT 0.2*    Recent Labs  10/25/14 0407    CHOL 177    Telemetry :  NSR no VT  ECG:  SR rate 61 Q lead 3  Poor R wave progresson    Studies/Results: Dg Chest 2 View  10/27/2014   CLINICAL DATA:  Preop for CABG.  EXAM: CHEST  2 VIEW  COMPARISON:  10/24/2014  FINDINGS: Borderline cardiomegaly. Negative aortic and hilar contours. Stable prominence of the first costochondral junction density, also present in 2009. There is no edema, consolidation, effusion, or pneumothorax.  IMPRESSION: No active cardiopulmonary disease.   Electronically Signed   By: Jorje Guild M.D.   On: 10/27/2014 07:34    Medications: I have reviewed the patient's current medications. Scheduled Meds: . aspirin  325 mg Oral Daily  . atorvastatin  80 mg Oral q1800  . hydrochlorothiazide  25 mg Oral Daily  . lisinopril  20 mg Oral Daily  . metoprolol tartrate  12.5 mg Oral BID  . nitroGLYCERIN  0.5 inch Topical 4 times per day  . sodium chloride  3 mL Intravenous Q12H  . sodium chloride  3 mL Intravenous Q12H   Continuous Infusions: . sodium chloride 150 mL/hr at 10/26/14 1038  . heparin 1,250 Units/hr (10/27/14 0704)   PRN Meds:.sodium chloride, acetaminophen **OR** acetaminophen, acetaminophen, ALPRAZolam, alum & mag hydroxide-simeth, HYDROmorphone (DILAUDID) injection, ondansetron **OR**  ondansetron (ZOFRAN) IV, ondansetron (ZOFRAN) IV, oxyCODONE, sodium chloride  Assessment/Plan: 58 year old female with a history of obesity, hypertension, hyperlipidemia, and strong family history for coronary artery disease. She has experienced increasing symptoms of exertional shortness of breath and chest pressure. She had recently presented to the emergency room and had negative troponins and T-wave inversion anteriorly. Her pain was nitrate responsive. She had cardiac catheterization revealing 70% LM stenosis and normal LV function.  CAD:  70% distal LM  Normal EF  No angina on heparin  Seen by PVT  CABG Monday Chol:  On high dose lipitor HTN:  Continue ACE

## 2014-10-28 LAB — GLUCOSE, CAPILLARY
GLUCOSE-CAPILLARY: 94 mg/dL (ref 70–99)
GLUCOSE-CAPILLARY: 91 mg/dL (ref 70–99)
Glucose-Capillary: 101 mg/dL — ABNORMAL HIGH (ref 70–99)
Glucose-Capillary: 92 mg/dL (ref 70–99)

## 2014-10-28 LAB — HEPARIN LEVEL (UNFRACTIONATED)
Heparin Unfractionated: 0.62 IU/mL (ref 0.30–0.70)
Heparin Unfractionated: 0.58 IU/mL (ref 0.30–0.70)

## 2014-10-28 LAB — PREPARE RBC (CROSSMATCH)

## 2014-10-28 LAB — CBC
HEMATOCRIT: 34 % — AB (ref 36.0–46.0)
Hemoglobin: 10.8 g/dL — ABNORMAL LOW (ref 12.0–15.0)
MCH: 26.9 pg (ref 26.0–34.0)
MCHC: 31.8 g/dL (ref 30.0–36.0)
MCV: 84.6 fL (ref 78.0–100.0)
Platelets: 306 10*3/uL (ref 150–400)
RBC: 4.02 MIL/uL (ref 3.87–5.11)
RDW: 15.1 % (ref 11.5–15.5)
WBC: 10.4 10*3/uL (ref 4.0–10.5)

## 2014-10-28 MED ORDER — SODIUM CHLORIDE 0.9 % IV SOLN
INTRAVENOUS | Status: AC
  Administered 2014-10-29: 69.8 mL/h via INTRAVENOUS
  Administered 2014-10-29: 11:00:00 via INTRAVENOUS
  Filled 2014-10-28: qty 40

## 2014-10-28 MED ORDER — TEMAZEPAM 15 MG PO CAPS
15.0000 mg | ORAL_CAPSULE | Freq: Once | ORAL | Status: AC | PRN
  Administered 2014-10-28: 15 mg via ORAL
  Filled 2014-10-28: qty 1

## 2014-10-28 MED ORDER — SODIUM CHLORIDE 0.9 % IV SOLN
INTRAVENOUS | Status: DC
  Filled 2014-10-28: qty 30

## 2014-10-28 MED ORDER — SODIUM CHLORIDE 0.9 % IV SOLN
1500.0000 mg | INTRAVENOUS | Status: AC
  Administered 2014-10-29: 1500 mg via INTRAVENOUS
  Filled 2014-10-28: qty 1500

## 2014-10-28 MED ORDER — MAGNESIUM SULFATE 50 % IJ SOLN
40.0000 meq | INTRAMUSCULAR | Status: DC
  Filled 2014-10-28: qty 10

## 2014-10-28 MED ORDER — NITROGLYCERIN IN D5W 200-5 MCG/ML-% IV SOLN
2.0000 ug/min | INTRAVENOUS | Status: AC
Start: 2014-10-29 — End: 2014-10-29
  Administered 2014-10-29: 5 ug/min via INTRAVENOUS
  Filled 2014-10-28: qty 250

## 2014-10-28 MED ORDER — ALPRAZOLAM 0.25 MG PO TABS: 0.2500 mg | ORAL_TABLET | ORAL | Status: DC | PRN

## 2014-10-28 MED ORDER — SODIUM CHLORIDE 0.9 % IV SOLN
INTRAVENOUS | Status: AC
  Administered 2014-10-29: 2.1 [IU]/h via INTRAVENOUS
  Filled 2014-10-28: qty 2.5

## 2014-10-28 MED ORDER — DOPAMINE-DEXTROSE 3.2-5 MG/ML-% IV SOLN
0.0000 ug/kg/min | INTRAVENOUS | Status: DC
  Filled 2014-10-28: qty 250

## 2014-10-28 MED ORDER — MUPIROCIN 2 % EX OINT
1.0000 "application " | TOPICAL_OINTMENT | Freq: Two times a day (BID) | CUTANEOUS | Status: AC
  Administered 2014-10-28 – 2014-11-01 (×9): 1 via NASAL
  Filled 2014-10-28 (×3): qty 22

## 2014-10-28 MED ORDER — VANCOMYCIN HCL 10 G IV SOLR
1250.0000 mg | INTRAVENOUS | Status: DC
  Filled 2014-10-28: qty 1250

## 2014-10-28 MED ORDER — DEXTROSE 5 % IV SOLN
750.0000 mg | INTRAVENOUS | Status: DC
  Filled 2014-10-28: qty 750

## 2014-10-28 MED ORDER — DIAZEPAM 5 MG PO TABS
5.0000 mg | ORAL_TABLET | Freq: Once | ORAL | Status: AC
  Administered 2014-10-29: 5 mg via ORAL
  Filled 2014-10-28: qty 1

## 2014-10-28 MED ORDER — CHLORHEXIDINE GLUCONATE CLOTH 2 % EX PADS
6.0000 | MEDICATED_PAD | Freq: Every day | CUTANEOUS | Status: DC
  Administered 2014-10-28: 6 via TOPICAL

## 2014-10-28 MED ORDER — POTASSIUM CHLORIDE 2 MEQ/ML IV SOLN
80.0000 meq | INTRAVENOUS | Status: DC
  Filled 2014-10-28: qty 40

## 2014-10-28 MED ORDER — DEXMEDETOMIDINE HCL IN NACL 400 MCG/100ML IV SOLN
0.1000 ug/kg/h | INTRAVENOUS | Status: AC
  Administered 2014-10-29: 0.3 ug/kg/h via INTRAVENOUS
  Filled 2014-10-28: qty 100

## 2014-10-28 MED ORDER — CHLORHEXIDINE GLUCONATE 4 % EX LIQD
60.0000 mL | Freq: Once | CUTANEOUS | Status: AC
  Administered 2014-10-28: 4 via TOPICAL
  Filled 2014-10-28: qty 60

## 2014-10-28 MED ORDER — BISACODYL 5 MG PO TBEC
5.0000 mg | DELAYED_RELEASE_TABLET | Freq: Once | ORAL | Status: DC
Start: 2014-10-28 — End: 2014-10-29

## 2014-10-28 MED ORDER — PHENYLEPHRINE HCL 10 MG/ML IJ SOLN
30.0000 ug/min | INTRAVENOUS | Status: AC
  Administered 2014-10-29: 20 ug/min via INTRAVENOUS
  Filled 2014-10-28: qty 2

## 2014-10-28 MED ORDER — CHLORHEXIDINE GLUCONATE 4 % EX LIQD
60.0000 mL | Freq: Once | CUTANEOUS | Status: AC
  Administered 2014-10-29: 4 via TOPICAL
  Filled 2014-10-28: qty 60

## 2014-10-28 MED ORDER — EPINEPHRINE HCL 1 MG/ML IJ SOLN
0.5000 ug/min | INTRAVENOUS | Status: DC
  Filled 2014-10-28: qty 4

## 2014-10-28 MED ORDER — METOPROLOL TARTRATE 12.5 MG HALF TABLET
12.5000 mg | ORAL_TABLET | Freq: Once | ORAL | Status: DC
  Filled 2014-10-28: qty 1

## 2014-10-28 MED ORDER — PLASMA-LYTE 148 IV SOLN
INTRAVENOUS | Status: AC
  Administered 2014-10-29: 250 mL
  Filled 2014-10-28: qty 2.5

## 2014-10-28 MED ORDER — DEXTROSE 5 % IV SOLN
1.5000 g | INTRAVENOUS | Status: AC
  Administered 2014-10-29: .75 g via INTRAVENOUS
  Administered 2014-10-29: 1.5 g via INTRAVENOUS
  Filled 2014-10-28: qty 1.5

## 2014-10-28 NOTE — Progress Notes (Signed)
ANTICOAGULATION CONSULT NOTE - Follow Up Consult  Pharmacy Consult for Heparin Indication: Severe CAD awaiting CABG consult  No Known Allergies  Patient Measurements: Height: 5\' 3"  (160 cm) Weight: 217 lb 12.8 oz (98.793 kg) IBW/kg (Calculated) : 52.4 Heparin Dosing Weight: 72kg  Vital Signs: Temp: 97.7 F (36.5 C) (11/08 1409) Temp Source: Oral (11/08 1409) BP: 111/71 mmHg (11/08 1409) Pulse Rate: 62 (11/08 1409)  Labs:  Recent Labs  10/26/14 1052 10/27/14 0415 10/27/14 1505 10/28/14 0026 10/28/14 0855  HGB  --  10.2*  --   --  10.8*  HCT  --  32.7*  --   --  34.0*  PLT  --  280  --   --  306  HEPARINUNFRC  --  0.20* 0.11* 0.62 0.58  CREATININE 1.14* 1.23*  --   --   --     Estimated Creatinine Clearance: 54.5 mL/min (by C-G formula based on Cr of 1.23).   Medications:  Heparin 1450 units/hr  Assessment: 58yof continuing on heparin s/p cath while awaiting CABG on 11/9. Heparin level (0.58) remains therapeutic - will continue current rate and follow-up post CABG orders tomorrow. - H/H and Plts stable - No significant bleeding reported  Goal of Therapy:  Heparin level 0.3-0.7 units/ml Monitor platelets by anticoagulation protocol: Yes   Plan:  1. Continue heparin drip 1450 units/hr (14.69ml/hr) 2. Daily heparin level and CBC 3. Follow-up post CABG plans  Earleen Newport  [phone] 10/28/2014,2:13 PM

## 2014-10-28 NOTE — Progress Notes (Signed)
Patient ID: Karina Green, female   DOB: 1956/09/01, 58 y.o.   MRN: [id number]      [address]       ,[place] [id number]             [phone]                 2 Days Post-Op Procedure(s) (LRB): LEFT HEART CATHETERIZATION WITH CORONARY ANGIOGRAM (N/A)  LOS: 4 days   Subjective: No chest pain, for cabg tomorrow  Objective: Vital signs in last 24 hours: Patient Vitals for the past 24 hrs:  BP Temp Temp src Pulse Resp SpO2  10/28/14 0413 121/67 mmHg 97.8 F (36.6 C) Oral (!) 59 18 97 %  10/27/14 2016 127/66 mmHg 97.2 F (36.2 C) Oral 62 18 99 %  10/27/14 1312 (!) 99/49 mmHg 97.4 F (36.3 C) Oral 61 19 92 %    Filed Weights   10/26/14 0534 10/26/14 2119 10/27/14 0535  Weight: 216 lb 0.8 oz (98 kg) 216 lb (97.977 kg) 217 lb 12.8 oz (98.793 kg)    Hemodynamic parameters for last 24 hours:    Intake/Output from previous day: 11/07 0701 - 11/08 0700 In: 1014 [P.O.:240; I.V.:774] Out: -  Intake/Output this shift:    Scheduled Meds: . aspirin  325 mg Oral Daily  . atorvastatin  80 mg Oral q1800  . bisacodyl  5 mg Oral Once  . chlorhexidine  60 mL Topical Once   And  . [START ON 10/29/2014] chlorhexidine  60 mL Topical Once  . Chlorhexidine Gluconate Cloth  6 each Topical Daily  . [START ON 10/29/2014] diazepam  5 mg Oral Once  . hydrochlorothiazide  25 mg Oral Daily  . lisinopril  20 mg Oral Daily  . metoprolol tartrate  12.5 mg Oral BID  . [START ON 10/29/2014] metoprolol tartrate  12.5 mg Oral Once  . mupirocin ointment  1 application Nasal BID  . nitroGLYCERIN  0.5 inch Topical 4 times per day  . sodium chloride  3 mL Intravenous Q12H  . sodium chloride  3 mL Intravenous Q12H   Continuous Infusions: . sodium chloride 150 mL/hr at 10/26/14 1038  . sodium chloride 50 mL/hr at 10/27/14 2141  . heparin 1,450 Units/hr (10/27/14 1751)   PRN Meds:.sodium chloride, acetaminophen **OR** acetaminophen, acetaminophen, ALPRAZolam, ALPRAZolam, alum & mag  hydroxide-simeth, HYDROmorphone (DILAUDID) injection, ondansetron **OR** ondansetron (ZOFRAN) IV, ondansetron (ZOFRAN) IV, oxyCODONE, sodium chloride, temazepam  General appearance: alert and cooperative Neurologic: intact Heart: regular rate and rhythm, S1, S2 normal, no murmur, click, rub or gallop Lungs: clear to auscultation bilaterally Abdomen: soft, non-tender; bowel sounds normal; no masses,  no organomegaly Extremities: extremities normal, atraumatic, no cyanosis or edema and Homans sign is negative, no sign of DVT  Lab Results: CBC: Recent Labs  10/27/14 0415 10/28/14 0855  WBC 8.8 10.4  HGB 10.2* 10.8*  HCT 32.7* 34.0*  PLT 280 306   BMET:  Recent Labs  10/26/14 1052 10/27/14 0415  NA 139 139  K 4.5 4.1  CL 105 105  CO2 19 19  GLUCOSE 103* 101*  BUN 21 21  CREATININE 1.14* 1.23*  CALCIUM 9.0 8.8    PT/INR:  Recent Labs  10/25/14 1257  LABPROT 14.6  INR 1.12     Radiology Dg Chest 2 View  10/27/2014   CLINICAL DATA:  Preop for CABG.  EXAM: CHEST  2 VIEW  COMPARISON:  10/24/2014  FINDINGS: Borderline cardiomegaly. Negative aortic and hilar  contours. Stable prominence of the first costochondral junction density, also present in 2009. There is no edema, consolidation, effusion, or pneumothorax.  IMPRESSION: No active cardiopulmonary disease.   Electronically Signed   By: Jorje Guild M.D.   On: 10/27/2014 07:34     Assessment/Plan: S/P Procedure(s) (LRB): LEFT HEART CATHETERIZATION WITH CORONARY ANGIOGRAM (N/A) 58 year old female presents with unstable angina and has been found to have a significant left main stenosis with preserved LV function. CABG has been recommended by Dr Prescott Gum for her best long-term treatment.   She understands the reasons for CABG as outlined by Dr Prescott Gum, patient has no further questions. For surgery in am.  Grace Isaac MD 10/28/2014 10:14 AM

## 2014-10-28 NOTE — Plan of Care (Signed)
Problem: Phase I Progression Outcomes Goal: Pain controlled with appropriate interventions Outcome: Completed/Met Date Met:  10/28/14 Goal: Voiding-avoid urinary catheter unless indicated Outcome: Completed/Met Date Met:  10/28/14 Goal: Hemodynamically stable Outcome: Completed/Met Date Met:  10/28/14 Goal: Distal pulses equal to baseline Outcome: Completed/Met Date Met:  10/28/14 Goal: Vascular site scale level 0 - I Vascular Site Scale Level 0: No bruising/bleeding/hematoma Level I (Mild): Bruising/Ecchymosis, minimal bleeding/ooozing, palpable hematoma < 3 cm Level II (Moderate): Bleeding not affecting hemodynamic parameters, pseudoaneurysm, palpable hematoma > 3 cm Level III (Severe) Bleeding which affects hemodynamic parameters or retroperitoneal hemorrhage  Outcome: Completed/Met Date Met:  10/28/14 Goal: Post Cath/PCI return to appropriate Path Outcome: Completed/Met Date Met:  10/28/14

## 2014-10-28 NOTE — Plan of Care (Signed)
Problem: Consults Goal: Cardiac Surgery Patient Education ( See Patient Education module for education specifics.) Outcome: Completed/Met Date Met:  10/28/14 Goal: Cardiac Rehab Consult Outcome: Completed/Met Date Met:  10/28/14 Goal: Skin Care Protocol Initiated - if Braden Score 18 or less If consults are not indicated, leave blank or document N/A Outcome: Completed/Met Date Met:  10/28/14 Goal: Tobacco Cessation referral if indicated Outcome: Not Applicable Date Met:  92/44/62 Goal: Nutrition Consult-if indicated Outcome: Not Applicable Date Met:  86/38/17 Goal: Diabetes Guidelines if Diabetic/Glucose > 140 If diabetic or lab glucose is > 140 mg/dl - Initiate Diabetes/Hyperglycemia Guidelines & Document Interventions  Outcome: Completed/Met Date Met:  10/28/14

## 2014-10-28 NOTE — Progress Notes (Signed)
ANTICOAGULATION CONSULT NOTE - Follow Up Consult  Pharmacy Consult for heparin Indication: CAD awaiting CABG  Labs:  Recent Labs  10/25/14 0407 10/25/14 1041 10/25/14 1257  10/26/14 1052 10/27/14 0415 10/27/14 1505 10/28/14 0026  HGB 10.1*  --   --   --   --  10.2*  --   --   HCT 32.3*  --   --   --   --  32.7*  --   --   PLT 282  --   --   --   --  280  --   --   APTT  --   --  52*  --   --   --   --   --   LABPROT  --   --  14.6  --   --   --   --   --   INR  --   --  1.12  --   --   --   --   --   HEPARINUNFRC  --   --   --   < >  --  0.20* 0.11* 0.62  CREATININE 1.21*  --   --   --  1.14* 1.23*  --   --   TROPONINI <0.30 <0.30  --   --   --   --   --   --   < > = values in this interval not displayed.   Assessment/Plan:  58yo female therapeutic on heparin after rate increases. Will continue gtt at current rate and confirm stable with additional level.   Wynona Neat, PharmD, BCPS  10/28/2014,1:23 AM

## 2014-10-28 NOTE — Progress Notes (Signed)
Patient ID: Karina Green, female   DOB: 03-07-1956, 58 y.o.   MRN: [id number]         Subjective: No pain  Seen by Dr Servando Snare this am   Objective: Vital signs in last 24 hours: Temp:  [97.2 F (36.2 C)-97.8 F (36.6 C)] 97.8 F (36.6 C) (11/08 0413) Pulse Rate:  [59-62] 59 (11/08 0413) Resp:  [18-19] 18 (11/08 0413) BP: (99-127)/(49-67) 121/67 mmHg (11/08 0413) SpO2:  [92 %-99 %] 97 % (11/08 0413) Weight change:  Last BM Date: 10/27/14 Intake/Output from previous day: +120 11/07 0701 - 11/08 0700 In: 1014 [P.O.:240; I.V.:774] Out: -  Intake/Output this shift:    PE: General:Pleasant affect, NAD Skin:Warm and dry, brisk capillary refill HEENT:normocephalic, sclera clear, mucus membranes moist Heart:S1S2 RRR without murmur, gallup, rub or click Lungs:clear without rales, rhonchi, or wheezes ALP:FXTK, non tender, + BS, do not palpate liver spleen or masses Ext:no lower ext edema, 2+ pedal pulses, 2+ radial pulses- clamp in place Neuro:alert and oriented, MAE, follows commands, + facial symmetry Right radial cath sight A    Lab Results:  Recent Labs  10/27/14 0415 10/28/14 0855  WBC 8.8 10.4  HGB 10.2* 10.8*  HCT 32.7* 34.0*  PLT 280 306   BMET  Recent Labs  10/26/14 1052 10/27/14 0415  NA 139 139  K 4.5 4.1  CL 105 105  CO2 19 19  GLUCOSE 103* 101*  BUN 21 21  CREATININE 1.14* 1.23*  CALCIUM 9.0 8.8   No results for input(s): TROPONINI in the last 72 hours.  Invalid input(s): CK, MB  Lab Results  Component Value Date   CHOL 177 10/25/2014   HDL 30* 10/25/2014   LDLCALC 100* 10/25/2014   TRIG 235* 10/25/2014   CHOLHDL 5.9 10/25/2014   Lab Results  Component Value Date   HGBA1C 6.6* 10/25/2014      Hepatic Function Panel No results for input(s): PROT, ALBUMIN, AST, ALT, ALKPHOS, BILITOT, BILIDIR, IBILI in the last 72 hours. No results for input(s): CHOL in the last 72 hours.  Telemetry :  NSR no VT  ECG:  SR rate 61 Q lead 3  Poor R  wave progresson    Studies/Results: Dg Chest 2 View  10/27/2014   CLINICAL DATA:  Preop for CABG.  EXAM: CHEST  2 VIEW  COMPARISON:  10/24/2014  FINDINGS: Borderline cardiomegaly. Negative aortic and hilar contours. Stable prominence of the first costochondral junction density, also present in 2009. There is no edema, consolidation, effusion, or pneumothorax.  IMPRESSION: No active cardiopulmonary disease.   Electronically Signed   By: Jorje Guild M.D.   On: 10/27/2014 07:34    Medications: I have reviewed the patient's current medications. Scheduled Meds: . aspirin  325 mg Oral Daily  . atorvastatin  80 mg Oral q1800  . bisacodyl  5 mg Oral Once  . chlorhexidine  60 mL Topical Once   And  . [START ON 10/29/2014] chlorhexidine  60 mL Topical Once  . Chlorhexidine Gluconate Cloth  6 each Topical Daily  . [START ON 10/29/2014] diazepam  5 mg Oral Once  . hydrochlorothiazide  25 mg Oral Daily  . lisinopril  20 mg Oral Daily  . metoprolol tartrate  12.5 mg Oral BID  . [START ON 10/29/2014] metoprolol tartrate  12.5 mg Oral Once  . mupirocin ointment  1 application Nasal BID  . nitroGLYCERIN  0.5 inch Topical 4 times per day  . sodium chloride  3 mL Intravenous Q12H  .  sodium chloride  3 mL Intravenous Q12H   Continuous Infusions: . sodium chloride 150 mL/hr at 10/26/14 1038  . sodium chloride 50 mL/hr at 10/27/14 2141  . heparin 1,450 Units/hr (10/27/14 1751)   PRN Meds:.sodium chloride, acetaminophen **OR** acetaminophen, acetaminophen, ALPRAZolam, ALPRAZolam, alum & mag hydroxide-simeth, HYDROmorphone (DILAUDID) injection, ondansetron **OR** ondansetron (ZOFRAN) IV, ondansetron (ZOFRAN) IV, oxyCODONE, sodium chloride, temazepam  Assessment/Plan: 58 year old female with a history of obesity, hypertension, hyperlipidemia, and strong family history for coronary artery disease. She has experienced increasing symptoms of exertional shortness of breath and chest pressure. She had  recently presented to the emergency room and had negative troponins and T-wave inversion anteriorly. Her pain was nitrate responsive. She had cardiac catheterization revealing 70% LM stenosis and normal LV function.  CAD:  70% distal LM  Normal EF  No angina on heparin  Seen by PVT  CABG Monday Chol:  On high dose lipitor HTN:  Continue ACE   Jenkins Rouge

## 2014-10-29 ENCOUNTER — Inpatient Hospital Stay (HOSPITAL_COMMUNITY): Payer: No Typology Code available for payment source

## 2014-10-29 ENCOUNTER — Encounter (HOSPITAL_COMMUNITY): Payer: Self-pay | Admitting: Anesthesiology

## 2014-10-29 ENCOUNTER — Inpatient Hospital Stay (HOSPITAL_COMMUNITY): Payer: No Typology Code available for payment source | Admitting: Anesthesiology

## 2014-10-29 ENCOUNTER — Encounter (HOSPITAL_COMMUNITY)
Admission: EM | Disposition: A | Discharge status: Home / Self Care | Payer: No Typology Code available for payment source | Source: Home / Self Care | Attending: Cardiothoracic Surgery

## 2014-10-29 DIAGNOSIS — I25110 Atherosclerotic heart disease of native coronary artery with unstable angina pectoris: Secondary | ICD-10-CM

## 2014-10-29 DIAGNOSIS — I2510 Atherosclerotic heart disease of native coronary artery without angina pectoris: Secondary | ICD-10-CM | POA: Diagnosis present

## 2014-10-29 HISTORY — PX: INTRAOPERATIVE TRANSESOPHAGEAL ECHOCARDIOGRAM: SHX5062

## 2014-10-29 HISTORY — PX: CORONARY ARTERY BYPASS GRAFT: SHX141

## 2014-10-29 LAB — POCT I-STAT 3, ART BLOOD GAS (G3+)
ACID-BASE DEFICIT: 3 mmol/L — AB (ref 0.0–2.0)
ACID-BASE DEFICIT: 7 mmol/L — AB (ref 0.0–2.0)
ACID-BASE DEFICIT: 3 mmol/L — AB (ref 0.0–2.0)
Acid-base deficit: 6 mmol/L — ABNORMAL HIGH (ref 0.0–2.0)
Acid-base deficit: 5 mmol/L — ABNORMAL HIGH (ref 0.0–2.0)
BICARBONATE: 22.2 meq/L (ref 20.0–24.0)
Bicarbonate: 18.8 mEq/L — ABNORMAL LOW (ref 20.0–24.0)
Bicarbonate: 19.2 mEq/L — ABNORMAL LOW (ref 20.0–24.0)
Bicarbonate: 20.9 mEq/L (ref 20.0–24.0)
Bicarbonate: 22.2 mEq/L (ref 20.0–24.0)
O2 SAT: 91 %
O2 Saturation: 100 %
O2 Saturation: 99 %
O2 Saturation: 97 %
O2 Saturation: 96 %
PCO2 ART: 40.3 mmHg (ref 35.0–45.0)
PCO2 ART: 40.2 mmHg (ref 35.0–45.0)
PCO2 ART: 38.6 mmHg (ref 35.0–45.0)
PH ART: 7.358 (ref 7.350–7.450)
PH ART: 7.321 — AB (ref 7.350–7.450)
PO2 ART: 317 mmHg — AB (ref 80.0–100.0)
Patient temperature: 35.5
Patient temperature: 36.3
Patient temperature: 36.4
TCO2: 23 mmol/L (ref 0–100)
TCO2: 20 mmol/L (ref 0–100)
TCO2: 20 mmol/L (ref 0–100)
TCO2: 22 mmol/L (ref 0–100)
TCO2: 23 mmol/L (ref 0–100)
pCO2 arterial: 33.4 mmHg — ABNORMAL LOW (ref 35.0–45.0)
pCO2 arterial: 36 mmHg (ref 35.0–45.0)
pH, Arterial: 7.348 — ABNORMAL LOW (ref 7.350–7.450)
pH, Arterial: 7.327 — ABNORMAL LOW (ref 7.350–7.450)
pH, Arterial: 7.365 (ref 7.350–7.450)
pO2, Arterial: 132 mmHg — ABNORMAL HIGH (ref 80.0–100.0)
pO2, Arterial: 61 mmHg — ABNORMAL LOW (ref 80.0–100.0)
pO2, Arterial: 90 mmHg (ref 80.0–100.0)
pO2, Arterial: 83 mmHg (ref 80.0–100.0)

## 2014-10-29 LAB — CBC
HCT: 31.4 % — ABNORMAL LOW (ref 36.0–46.0)
HCT: 25.5 % — ABNORMAL LOW (ref 36.0–46.0)
HCT: 26.1 % — ABNORMAL LOW (ref 36.0–46.0)
Hemoglobin: 10 g/dL — ABNORMAL LOW (ref 12.0–15.0)
Hemoglobin: 8.2 g/dL — ABNORMAL LOW (ref 12.0–15.0)
Hemoglobin: 8.5 g/dL — ABNORMAL LOW (ref 12.0–15.0)
MCH: 27 pg (ref 26.0–34.0)
MCH: 27.5 pg (ref 26.0–34.0)
MCH: 27.1 pg (ref 26.0–34.0)
MCHC: 31.8 g/dL (ref 30.0–36.0)
MCHC: 32.2 g/dL (ref 30.0–36.0)
MCHC: 32.6 g/dL (ref 30.0–36.0)
MCV: 84.6 fL (ref 78.0–100.0)
MCV: 85.6 fL (ref 78.0–100.0)
MCV: 83.1 fL (ref 78.0–100.0)
PLATELETS: 263 10*3/uL (ref 150–400)
Platelets: 125 10*3/uL — ABNORMAL LOW (ref 150–400)
Platelets: 127 10*3/uL — ABNORMAL LOW (ref 150–400)
RBC: 3.71 MIL/uL — ABNORMAL LOW (ref 3.87–5.11)
RBC: 2.98 MIL/uL — ABNORMAL LOW (ref 3.87–5.11)
RBC: 3.14 MIL/uL — ABNORMAL LOW (ref 3.87–5.11)
RDW: 15.2 % (ref 11.5–15.5)
RDW: 15.2 % (ref 11.5–15.5)
RDW: 15.2 % (ref 11.5–15.5)
WBC: 11.4 10*3/uL — ABNORMAL HIGH (ref 4.0–10.5)
WBC: 10.6 10*3/uL — AB (ref 4.0–10.5)
WBC: 10.1 10*3/uL (ref 4.0–10.5)

## 2014-10-29 LAB — APTT: APTT: 35 s (ref 24–37)

## 2014-10-29 LAB — POCT I-STAT, CHEM 8
BUN: 17 mg/dL (ref 6–23)
BUN: 15 mg/dL (ref 6–23)
BUN: 14 mg/dL (ref 6–23)
BUN: 14 mg/dL (ref 6–23)
BUN: 14 mg/dL (ref 6–23)
BUN: 12 mg/dL (ref 6–23)
CALCIUM ION: 1.11 mmol/L — AB (ref 1.13–1.30)
CALCIUM ION: 1.11 mmol/L — AB (ref 1.13–1.30)
CHLORIDE: 111 meq/L (ref 96–112)
CREATININE: 0.9 mg/dL (ref 0.50–1.10)
CREATININE: 1 mg/dL (ref 0.50–1.10)
Calcium, Ion: 1.29 mmol/L (ref 1.13–1.30)
Calcium, Ion: 1.28 mmol/L (ref 1.13–1.30)
Calcium, Ion: 1.09 mmol/L — ABNORMAL LOW (ref 1.13–1.30)
Calcium, Ion: 1.18 mmol/L (ref 1.13–1.30)
Chloride: 109 mEq/L (ref 96–112)
Chloride: 103 mEq/L (ref 96–112)
Chloride: 105 mEq/L (ref 96–112)
Chloride: 107 mEq/L (ref 96–112)
Chloride: 108 mEq/L (ref 96–112)
Creatinine, Ser: 1 mg/dL (ref 0.50–1.10)
Creatinine, Ser: 1 mg/dL (ref 0.50–1.10)
Creatinine, Ser: 0.9 mg/dL (ref 0.50–1.10)
Creatinine, Ser: 1 mg/dL (ref 0.50–1.10)
GLUCOSE: 121 mg/dL — AB (ref 70–99)
GLUCOSE: 196 mg/dL — AB (ref 70–99)
Glucose, Bld: 130 mg/dL — ABNORMAL HIGH (ref 70–99)
Glucose, Bld: 114 mg/dL — ABNORMAL HIGH (ref 70–99)
Glucose, Bld: 204 mg/dL — ABNORMAL HIGH (ref 70–99)
Glucose, Bld: 110 mg/dL — ABNORMAL HIGH (ref 70–99)
HCT: 29 % — ABNORMAL LOW (ref 36.0–46.0)
HCT: 29 % — ABNORMAL LOW (ref 36.0–46.0)
HCT: 21 % — ABNORMAL LOW (ref 36.0–46.0)
HCT: 31 % — ABNORMAL LOW (ref 36.0–46.0)
HEMATOCRIT: 21 % — AB (ref 36.0–46.0)
HEMATOCRIT: 24 % — AB (ref 36.0–46.0)
HEMOGLOBIN: 7.1 g/dL — AB (ref 12.0–15.0)
HEMOGLOBIN: 7.1 g/dL — AB (ref 12.0–15.0)
HEMOGLOBIN: 8.2 g/dL — AB (ref 12.0–15.0)
HEMOGLOBIN: 10.5 g/dL — AB (ref 12.0–15.0)
Hemoglobin: 9.9 g/dL — ABNORMAL LOW (ref 12.0–15.0)
Hemoglobin: 9.9 g/dL — ABNORMAL LOW (ref 12.0–15.0)
POTASSIUM: 3.6 meq/L — AB (ref 3.7–5.3)
POTASSIUM: 4.2 meq/L (ref 3.7–5.3)
POTASSIUM: 4.5 meq/L (ref 3.7–5.3)
Potassium: 3.6 mEq/L — ABNORMAL LOW (ref 3.7–5.3)
Potassium: 3.5 mEq/L — ABNORMAL LOW (ref 3.7–5.3)
Potassium: 3.8 mEq/L (ref 3.7–5.3)
SODIUM: 141 meq/L (ref 137–147)
SODIUM: 138 meq/L (ref 137–147)
Sodium: 140 mEq/L (ref 137–147)
Sodium: 137 mEq/L (ref 137–147)
Sodium: 137 mEq/L (ref 137–147)
Sodium: 141 mEq/L (ref 137–147)
TCO2: 20 mmol/L (ref 0–100)
TCO2: 19 mmol/L (ref 0–100)
TCO2: 23 mmol/L (ref 0–100)
TCO2: 18 mmol/L (ref 0–100)
TCO2: 21 mmol/L (ref 0–100)
TCO2: 20 mmol/L (ref 0–100)

## 2014-10-29 LAB — HEMOGLOBIN AND HEMATOCRIT, BLOOD
HCT: 22.5 % — ABNORMAL LOW (ref 36.0–46.0)
Hemoglobin: 7.3 g/dL — ABNORMAL LOW (ref 12.0–15.0)

## 2014-10-29 LAB — POCT I-STAT 4, (NA,K, GLUC, HGB,HCT)
Glucose, Bld: 147 mg/dL — ABNORMAL HIGH (ref 70–99)
HCT: 24 % — ABNORMAL LOW (ref 36.0–46.0)
Hemoglobin: 8.2 g/dL — ABNORMAL LOW (ref 12.0–15.0)
POTASSIUM: 3.3 meq/L — AB (ref 3.7–5.3)
SODIUM: 138 meq/L (ref 137–147)

## 2014-10-29 LAB — PLATELET COUNT: Platelets: 162 10*3/uL (ref 150–400)

## 2014-10-29 LAB — GLUCOSE, CAPILLARY
Glucose-Capillary: 96 mg/dL (ref 70–99)
Glucose-Capillary: 109 mg/dL — ABNORMAL HIGH (ref 70–99)

## 2014-10-29 LAB — HEPARIN LEVEL (UNFRACTIONATED): Heparin Unfractionated: 0.68 IU/mL (ref 0.30–0.70)

## 2014-10-29 LAB — CREATININE, SERUM
Creatinine, Ser: 1.04 mg/dL (ref 0.50–1.10)
GFR calc Af Amer: 66 mL/min — ABNORMAL LOW (ref >90)
GFR calc non Af Amer: 57 mL/min — ABNORMAL LOW (ref >90)

## 2014-10-29 LAB — PROTIME-INR
INR: 1.46 (ref 0.00–1.49)
Prothrombin Time: 17.9 seconds — ABNORMAL HIGH (ref 11.6–15.2)

## 2014-10-29 LAB — MAGNESIUM: Magnesium: 2.6 mg/dL — ABNORMAL HIGH (ref 1.5–2.5)

## 2014-10-29 LAB — POCT I-STAT GLUCOSE
GLUCOSE: 114 mg/dL — AB (ref 70–99)
Operator id: 282221

## 2014-10-29 LAB — PREPARE RBC (CROSSMATCH)

## 2014-10-29 SURGERY — CORONARY ARTERY BYPASS GRAFTING (CABG)
Anesthesia: General | Site: Chest

## 2014-10-29 MED ORDER — DOCUSATE SODIUM 100 MG PO CAPS
200.0000 mg | ORAL_CAPSULE | Freq: Every day | ORAL | Status: DC
  Administered 2014-10-30 – 2014-11-02 (×4): 200 mg via ORAL
  Filled 2014-10-29 (×6): qty 2

## 2014-10-29 MED ORDER — MIDAZOLAM HCL 2 MG/2ML IJ SOLN: 2.0000 mg | INTRAMUSCULAR | Status: DC | PRN

## 2014-10-29 MED ORDER — METOPROLOL TARTRATE 12.5 MG HALF TABLET
12.5000 mg | ORAL_TABLET | Freq: Two times a day (BID) | ORAL | Status: DC
  Administered 2014-10-30 – 2014-11-04 (×10): 12.5 mg via ORAL
  Filled 2014-10-29 (×13): qty 1

## 2014-10-29 MED ORDER — HYDROMORPHONE HCL 1 MG/ML IJ SOLN: 0.2500 mg | INTRAMUSCULAR | Status: DC | PRN

## 2014-10-29 MED ORDER — ACETAMINOPHEN 160 MG/5ML PO SOLN: 650.0000 mg | Freq: Once | ORAL | Status: AC

## 2014-10-29 MED ORDER — METOPROLOL TARTRATE 1 MG/ML IV SOLN: 2.5000 mg | INTRAVENOUS | Status: DC | PRN

## 2014-10-29 MED ORDER — PROPOFOL 10 MG/ML IV BOLUS
INTRAVENOUS | Status: AC
  Filled 2014-10-29: qty 20

## 2014-10-29 MED ORDER — VECURONIUM BROMIDE 10 MG IV SOLR
INTRAVENOUS | Status: DC | PRN
  Administered 2014-10-29: 10 mg via INTRAVENOUS
  Administered 2014-10-29: 5 mg via INTRAVENOUS

## 2014-10-29 MED ORDER — AMIODARONE HCL IN DEXTROSE 360-4.14 MG/200ML-% IV SOLN
30.0000 mg/h | INTRAVENOUS | Status: AC
  Administered 2014-10-29: 60 mg/h via INTRAVENOUS
  Filled 2014-10-29: qty 200

## 2014-10-29 MED ORDER — AMIODARONE IV BOLUS ONLY 150 MG/100ML
150.0000 mg | Freq: Once | INTRAVENOUS | Status: AC
  Administered 2014-10-29: 150 mg via INTRAVENOUS

## 2014-10-29 MED ORDER — PROTAMINE SULFATE 10 MG/ML IV SOLN
INTRAVENOUS | Status: DC | PRN
  Administered 2014-10-29 (×3): 50 mg via INTRAVENOUS
  Administered 2014-10-29: 30 mg via INTRAVENOUS
  Administered 2014-10-29 (×2): 50 mg via INTRAVENOUS

## 2014-10-29 MED ORDER — 0.9 % SODIUM CHLORIDE (POUR BTL) OPTIME
TOPICAL | Status: DC | PRN
  Administered 2014-10-29: 1000 mL

## 2014-10-29 MED ORDER — SODIUM CHLORIDE 0.9 % IV SOLN
250.0000 mL | INTRAVENOUS | Status: DC
  Administered 2014-10-30: 250 mL via INTRAVENOUS

## 2014-10-29 MED ORDER — SODIUM CHLORIDE 0.9 % IV SOLN
INTRAVENOUS | Status: DC
  Administered 2014-10-29: 14:00:00 via INTRAVENOUS

## 2014-10-29 MED ORDER — VANCOMYCIN HCL IN DEXTROSE 1-5 GM/200ML-% IV SOLN
1000.0000 mg | Freq: Two times a day (BID) | INTRAVENOUS | Status: AC
Start: 2014-10-29 — End: 2014-10-30
  Administered 2014-10-29 – 2014-10-30 (×3): 1000 mg via INTRAVENOUS
  Filled 2014-10-29 (×3): qty 200

## 2014-10-29 MED ORDER — ASPIRIN EC 325 MG PO TBEC
325.0000 mg | DELAYED_RELEASE_TABLET | Freq: Every day | ORAL | Status: DC
  Administered 2014-10-30 – 2014-11-04 (×6): 325 mg via ORAL
  Filled 2014-10-29 (×6): qty 1

## 2014-10-29 MED ORDER — ROCURONIUM BROMIDE 50 MG/5ML IV SOLN
INTRAVENOUS | Status: AC
  Filled 2014-10-29: qty 1

## 2014-10-29 MED ORDER — HEPARIN SODIUM (PORCINE) 1000 UNIT/ML IJ SOLN
INTRAMUSCULAR | Status: DC | PRN
  Administered 2014-10-29: 29000 [IU] via INTRAVENOUS
  Administered 2014-10-29: 3000 [IU] via INTRAVENOUS

## 2014-10-29 MED ORDER — ALPRAZOLAM 0.5 MG PO TABS: 0.5000 mg | ORAL_TABLET | Freq: Two times a day (BID) | ORAL | Status: DC | PRN

## 2014-10-29 MED ORDER — FENTANYL CITRATE 0.05 MG/ML IJ SOLN
INTRAMUSCULAR | Status: AC
  Filled 2014-10-29: qty 5

## 2014-10-29 MED ORDER — VECURONIUM BROMIDE 10 MG IV SOLR
INTRAVENOUS | Status: AC
  Filled 2014-10-29: qty 10

## 2014-10-29 MED ORDER — LACTATED RINGERS IV SOLN
INTRAVENOUS | Status: DC
  Administered 2014-10-29: 13:00:00 via INTRAVENOUS

## 2014-10-29 MED ORDER — PROTAMINE SULFATE 10 MG/ML IV SOLN
INTRAVENOUS | Status: AC
  Filled 2014-10-29: qty 50

## 2014-10-29 MED ORDER — ALBUMIN HUMAN 5 % IV SOLN
INTRAVENOUS | Status: DC | PRN
  Administered 2014-10-29 (×2): via INTRAVENOUS

## 2014-10-29 MED ORDER — ARTIFICIAL TEARS OP OINT
TOPICAL_OINTMENT | OPHTHALMIC | Status: DC | PRN
  Administered 2014-10-29: 1 via OPHTHALMIC

## 2014-10-29 MED ORDER — ACETAMINOPHEN 160 MG/5ML PO SOLN
1000.0000 mg | Freq: Four times a day (QID) | ORAL | Status: AC
  Filled 2014-10-29: qty 40

## 2014-10-29 MED ORDER — PROMETHAZINE HCL 25 MG/ML IJ SOLN: 6.2500 mg | INTRAMUSCULAR | Status: DC | PRN

## 2014-10-29 MED ORDER — SUCCINYLCHOLINE CHLORIDE 20 MG/ML IJ SOLN
INTRAMUSCULAR | Status: AC
  Filled 2014-10-29: qty 1

## 2014-10-29 MED ORDER — MIDAZOLAM HCL 5 MG/5ML IJ SOLN
INTRAMUSCULAR | Status: DC | PRN
  Administered 2014-10-29: 2 mg via INTRAVENOUS
  Administered 2014-10-29: 1 mg via INTRAVENOUS
  Administered 2014-10-29 (×2): 2 mg via INTRAVENOUS
  Administered 2014-10-29 (×3): 1 mg via INTRAVENOUS

## 2014-10-29 MED ORDER — AMIODARONE HCL IN DEXTROSE 360-4.14 MG/200ML-% IV SOLN
60.0000 mg/h | INTRAVENOUS | Status: AC
  Administered 2014-10-29 (×2): 60 mg/h via INTRAVENOUS

## 2014-10-29 MED ORDER — DOPAMINE-DEXTROSE 3.2-5 MG/ML-% IV SOLN: 0.0000 ug/kg/min | INTRAVENOUS | Status: DC

## 2014-10-29 MED ORDER — POTASSIUM CHLORIDE 10 MEQ/50ML IV SOLN
10.0000 meq | INTRAVENOUS | Status: AC
  Administered 2014-10-29 (×4): 10 meq via INTRAVENOUS

## 2014-10-29 MED ORDER — CHLORHEXIDINE GLUCONATE 0.12 % MT SOLN
15.0000 mL | Freq: Two times a day (BID) | OROMUCOSAL | Status: DC
  Administered 2014-10-29 – 2014-10-31 (×4): 15 mL via OROMUCOSAL
  Filled 2014-10-29 (×4): qty 15

## 2014-10-29 MED ORDER — PROPOFOL 10 MG/ML IV BOLUS
INTRAVENOUS | Status: DC | PRN
  Administered 2014-10-29: 150 mg via INTRAVENOUS

## 2014-10-29 MED ORDER — NITROGLYCERIN IN D5W 200-5 MCG/ML-% IV SOLN
0.0000 ug/min | INTRAVENOUS | Status: DC
  Administered 2014-10-29: 0 ug/min via INTRAVENOUS

## 2014-10-29 MED ORDER — SODIUM BICARBONATE 8.4 % IV SOLN
50.0000 meq | Freq: Once | INTRAVENOUS | Status: AC
  Administered 2014-10-29: 50 meq via INTRAVENOUS

## 2014-10-29 MED ORDER — POTASSIUM CHLORIDE 10 MEQ/50ML IV SOLN: 10.0000 meq | INTRAVENOUS | Status: AC

## 2014-10-29 MED ORDER — PHENYLEPHRINE HCL 10 MG/ML IJ SOLN
0.0000 ug/min | INTRAVENOUS | Status: DC
  Administered 2014-10-29: 10 ug/min via INTRAVENOUS
  Filled 2014-10-29: qty 2

## 2014-10-29 MED ORDER — AMIODARONE HCL IN DEXTROSE 360-4.14 MG/200ML-% IV SOLN
30.0000 mg/h | INTRAVENOUS | Status: DC
  Administered 2014-10-29 – 2014-10-30 (×2): 30 mg/h via INTRAVENOUS
  Filled 2014-10-29 (×3): qty 200

## 2014-10-29 MED ORDER — EPHEDRINE SULFATE 50 MG/ML IJ SOLN
INTRAMUSCULAR | Status: AC
  Filled 2014-10-29: qty 1

## 2014-10-29 MED ORDER — LACTATED RINGERS IV SOLN
INTRAVENOUS | Status: DC | PRN
  Administered 2014-10-29: 07:00:00 via INTRAVENOUS

## 2014-10-29 MED ORDER — MORPHINE SULFATE 2 MG/ML IJ SOLN: 1.0000 mg | INTRAMUSCULAR | Status: AC | PRN

## 2014-10-29 MED ORDER — SODIUM CHLORIDE 0.9 % IV SOLN
INTRAVENOUS | Status: DC
  Administered 2014-10-29: 5.2 [IU]/h via INTRAVENOUS
  Filled 2014-10-29: qty 2.5

## 2014-10-29 MED ORDER — DEXMEDETOMIDINE HCL IN NACL 200 MCG/50ML IV SOLN
0.0000 ug/kg/h | INTRAVENOUS | Status: DC
  Administered 2014-10-29: 0.5 ug/kg/h via INTRAVENOUS
  Filled 2014-10-29: qty 50

## 2014-10-29 MED ORDER — STERILE WATER FOR INJECTION IJ SOLN
INTRAMUSCULAR | Status: AC
  Filled 2014-10-29: qty 10

## 2014-10-29 MED ORDER — ONDANSETRON HCL 4 MG/2ML IJ SOLN
4.0000 mg | Freq: Four times a day (QID) | INTRAMUSCULAR | Status: DC | PRN
  Administered 2014-10-30 – 2014-10-31 (×3): 4 mg via INTRAVENOUS
  Filled 2014-10-29 (×3): qty 2

## 2014-10-29 MED ORDER — CETYLPYRIDINIUM CHLORIDE 0.05 % MT LIQD
7.0000 mL | Freq: Four times a day (QID) | OROMUCOSAL | Status: DC
  Administered 2014-10-29 – 2014-10-31 (×6): 7 mL via OROMUCOSAL

## 2014-10-29 MED ORDER — PANTOPRAZOLE SODIUM 40 MG PO TBEC
40.0000 mg | DELAYED_RELEASE_TABLET | Freq: Every day | ORAL | Status: DC
  Administered 2014-10-31 – 2014-11-04 (×5): 40 mg via ORAL
  Filled 2014-10-29 (×3): qty 1

## 2014-10-29 MED ORDER — VANCOMYCIN HCL IN DEXTROSE 1-5 GM/200ML-% IV SOLN
1000.0000 mg | Freq: Once | INTRAVENOUS | Status: DC
  Filled 2014-10-29: qty 200

## 2014-10-29 MED ORDER — SODIUM CHLORIDE 0.9 % IJ SOLN
INTRAMUSCULAR | Status: AC
  Filled 2014-10-29: qty 10

## 2014-10-29 MED ORDER — DEXTROSE 5 % IV SOLN
1.5000 g | Freq: Two times a day (BID) | INTRAVENOUS | Status: AC
  Administered 2014-10-29 – 2014-10-31 (×4): 1.5 g via INTRAVENOUS
  Filled 2014-10-29 (×4): qty 1.5

## 2014-10-29 MED ORDER — INSULIN REGULAR BOLUS VIA INFUSION
0.0000 [IU] | Freq: Three times a day (TID) | INTRAVENOUS | Status: DC
  Filled 2014-10-29: qty 10

## 2014-10-29 MED ORDER — AMIODARONE LOAD VIA INFUSION
INTRAVENOUS | Status: DC | PRN
  Administered 2014-10-29: 150 mg via INTRAVENOUS

## 2014-10-29 MED ORDER — ALBUMIN HUMAN 5 % IV SOLN
250.0000 mL | INTRAVENOUS | Status: AC | PRN
  Administered 2014-10-29 (×3): 250 mL via INTRAVENOUS
  Filled 2014-10-29: qty 250

## 2014-10-29 MED ORDER — LIDOCAINE HCL (CARDIAC) 20 MG/ML IV SOLN
INTRAVENOUS | Status: AC
  Filled 2014-10-29: qty 5

## 2014-10-29 MED ORDER — LIDOCAINE HCL (CARDIAC) 20 MG/ML IV SOLN
INTRAVENOUS | Status: DC | PRN
  Administered 2014-10-29: 60 mg via INTRAVENOUS

## 2014-10-29 MED ORDER — EPHEDRINE SULFATE 50 MG/ML IJ SOLN
INTRAMUSCULAR | Status: DC | PRN
  Administered 2014-10-29: 10 mg via INTRAVENOUS
  Administered 2014-10-29: 5 mg via INTRAVENOUS

## 2014-10-29 MED ORDER — FENTANYL CITRATE 0.05 MG/ML IJ SOLN
INTRAMUSCULAR | Status: DC | PRN
  Administered 2014-10-29 (×3): 250 ug via INTRAVENOUS
  Administered 2014-10-29: 50 ug via INTRAVENOUS
  Administered 2014-10-29: 150 ug via INTRAVENOUS
  Administered 2014-10-29: 25 ug via INTRAVENOUS
  Administered 2014-10-29: 150 ug via INTRAVENOUS
  Administered 2014-10-29: 250 ug via INTRAVENOUS
  Administered 2014-10-29: 100 ug via INTRAVENOUS
  Administered 2014-10-29: 25 ug via INTRAVENOUS
  Administered 2014-10-29: 250 ug via INTRAVENOUS

## 2014-10-29 MED ORDER — HEMOSTATIC AGENTS (NO CHARGE) OPTIME
TOPICAL | Status: DC | PRN
  Administered 2014-10-29: 1 via TOPICAL

## 2014-10-29 MED ORDER — LACTATED RINGERS IV SOLN
INTRAVENOUS | Status: DC | PRN
  Administered 2014-10-29 (×2): via INTRAVENOUS

## 2014-10-29 MED ORDER — SODIUM CHLORIDE 0.9 % IV SOLN
0.4000 ug/kg/h | INTRAVENOUS | Status: DC
  Filled 2014-10-29: qty 4

## 2014-10-29 MED ORDER — AMIODARONE HCL IN DEXTROSE 360-4.14 MG/200ML-% IV SOLN
INTRAVENOUS | Status: AC
  Filled 2014-10-29: qty 200

## 2014-10-29 MED ORDER — MIDAZOLAM HCL 10 MG/2ML IJ SOLN
INTRAMUSCULAR | Status: AC
  Filled 2014-10-29: qty 2

## 2014-10-29 MED ORDER — HEPARIN SODIUM (PORCINE) 1000 UNIT/ML IJ SOLN
INTRAMUSCULAR | Status: AC
  Filled 2014-10-29: qty 2

## 2014-10-29 MED ORDER — TRAMADOL HCL 50 MG PO TABS
50.0000 mg | ORAL_TABLET | ORAL | Status: DC | PRN
  Administered 2014-10-31 – 2014-11-04 (×2): 100 mg via ORAL
  Filled 2014-10-29 (×2): qty 2

## 2014-10-29 MED ORDER — PHENYLEPHRINE 40 MCG/ML (10ML) SYRINGE FOR IV PUSH (FOR BLOOD PRESSURE SUPPORT)
PREFILLED_SYRINGE | INTRAVENOUS | Status: AC
  Filled 2014-10-29: qty 10

## 2014-10-29 MED ORDER — ACETAMINOPHEN 650 MG RE SUPP
650.0000 mg | Freq: Once | RECTAL | Status: AC
  Administered 2014-10-29: 650 mg via RECTAL

## 2014-10-29 MED ORDER — SODIUM CHLORIDE 0.9 % IJ SOLN
OROMUCOSAL | Status: DC | PRN
  Administered 2014-10-29 (×3): via TOPICAL

## 2014-10-29 MED ORDER — BISACODYL 5 MG PO TBEC
10.0000 mg | DELAYED_RELEASE_TABLET | Freq: Every day | ORAL | Status: DC
  Administered 2014-10-30 – 2014-11-02 (×4): 10 mg via ORAL
  Filled 2014-10-29 (×4): qty 2

## 2014-10-29 MED ORDER — SODIUM CHLORIDE 0.45 % IV SOLN
INTRAVENOUS | Status: DC
  Administered 2014-10-29: 14:00:00 via INTRAVENOUS

## 2014-10-29 MED ORDER — METOPROLOL TARTRATE 25 MG/10 ML ORAL SUSPENSION
12.5000 mg | Freq: Two times a day (BID) | ORAL | Status: DC
  Filled 2014-10-29 (×13): qty 5

## 2014-10-29 MED ORDER — LACTATED RINGERS IV SOLN
INTRAVENOUS | Status: DC | PRN
  Administered 2014-10-29 (×2): via INTRAVENOUS

## 2014-10-29 MED ORDER — FAMOTIDINE IN NACL 20-0.9 MG/50ML-% IV SOLN
20.0000 mg | Freq: Two times a day (BID) | INTRAVENOUS | Status: DC
  Administered 2014-10-29: 20 mg via INTRAVENOUS

## 2014-10-29 MED ORDER — ARTIFICIAL TEARS OP OINT
TOPICAL_OINTMENT | OPHTHALMIC | Status: AC
  Filled 2014-10-29: qty 3.5

## 2014-10-29 MED ORDER — HEPARIN SODIUM (PORCINE) 1000 UNIT/ML IJ SOLN
INTRAMUSCULAR | Status: AC
  Filled 2014-10-29: qty 1

## 2014-10-29 MED ORDER — LACTATED RINGERS IV SOLN: 500.0000 mL | Freq: Once | INTRAVENOUS | Status: DC | PRN

## 2014-10-29 MED ORDER — SODIUM CHLORIDE 0.9 % IJ SOLN
3.0000 mL | INTRAMUSCULAR | Status: DC | PRN
Start: 2014-10-30 — End: 2014-11-04

## 2014-10-29 MED ORDER — ASPIRIN 81 MG PO CHEW: 324.0000 mg | CHEWABLE_TABLET | Freq: Every day | ORAL | Status: DC

## 2014-10-29 MED ORDER — OXYCODONE HCL 5 MG PO TABS: 5.0000 mg | ORAL_TABLET | ORAL | Status: DC | PRN

## 2014-10-29 MED ORDER — MORPHINE SULFATE 2 MG/ML IJ SOLN
2.0000 mg | INTRAMUSCULAR | Status: DC | PRN
  Administered 2014-10-30 (×2): 4 mg via INTRAVENOUS
  Administered 2014-10-30: 2 mg via INTRAVENOUS
  Filled 2014-10-29: qty 1
  Filled 2014-10-29 (×2): qty 2

## 2014-10-29 MED ORDER — SODIUM CHLORIDE 0.9 % IV SOLN
0.5000 g/h | Freq: Once | INTRAVENOUS | Status: DC
  Filled 2014-10-29: qty 20

## 2014-10-29 MED ORDER — SODIUM BICARBONATE 8.4 % IV SOLN
50.0000 meq | Freq: Once | INTRAVENOUS | Status: AC
  Administered 2014-10-29: 50 meq via INTRAVENOUS
  Filled 2014-10-29: qty 50

## 2014-10-29 MED ORDER — ACETAMINOPHEN 500 MG PO TABS
1000.0000 mg | ORAL_TABLET | Freq: Four times a day (QID) | ORAL | Status: AC
  Administered 2014-10-30 – 2014-11-03 (×13): 1000 mg via ORAL
  Filled 2014-10-29 (×19): qty 2

## 2014-10-29 MED ORDER — SODIUM CHLORIDE 0.9 % IJ SOLN
3.0000 mL | Freq: Two times a day (BID) | INTRAMUSCULAR | Status: DC
  Administered 2014-10-30 – 2014-11-04 (×7): 3 mL via INTRAVENOUS

## 2014-10-29 MED ORDER — BISACODYL 10 MG RE SUPP: 10.0000 mg | Freq: Every day | RECTAL | Status: DC

## 2014-10-29 MED ORDER — MAGNESIUM SULFATE 4 GM/100ML IV SOLN
4.0000 g | Freq: Once | INTRAVENOUS | Status: AC
  Administered 2014-10-29: 4 g via INTRAVENOUS
  Filled 2014-10-29: qty 100

## 2014-10-29 MED ORDER — ROCURONIUM BROMIDE 100 MG/10ML IV SOLN
INTRAVENOUS | Status: DC | PRN
  Administered 2014-10-29: 50 mg via INTRAVENOUS

## 2014-10-29 SURGICAL SUPPLY — 106 items
ADAPTER CARDIO PERF ANTE/RETRO (ADAPTER) ×4 IMPLANT
ADH SKN CLS APL DERMABOND .7 (GAUZE/BANDAGES/DRESSINGS) ×4
ADPR PRFSN 84XANTGRD RTRGD (ADAPTER) ×2
ATTRACTOMAT 16X20 MAGNETIC DRP (DRAPES) ×2 IMPLANT
BAG DECANTER FOR FLEXI CONT (MISCELLANEOUS) ×4 IMPLANT
BANDAGE ELASTIC 4 VELCRO ST LF (GAUZE/BANDAGES/DRESSINGS) ×4 IMPLANT
BANDAGE ELASTIC 6 VELCRO ST LF (GAUZE/BANDAGES/DRESSINGS) ×4 IMPLANT
BASKET HEART  (ORDER IN 25'S) (MISCELLANEOUS) ×1
BASKET HEART (ORDER IN 25'S) (MISCELLANEOUS) ×1
BASKET HEART (ORDER IN 25S) (MISCELLANEOUS) ×2 IMPLANT
BINDER BREAST XLRG (GAUZE/BANDAGES/DRESSINGS) ×3 IMPLANT
BLADE STERNUM SYSTEM 6 (BLADE) ×4 IMPLANT
BLADE SURG 11 STRL SS (BLADE) ×2 IMPLANT
BLADE SURG 12 STRL SS (BLADE) ×4 IMPLANT
BLADE SURG ROTATE 9660 (MISCELLANEOUS) IMPLANT
BNDG GAUZE ELAST 4 BULKY (GAUZE/BANDAGES/DRESSINGS) ×4 IMPLANT
CANISTER SUCTION 2500CC (MISCELLANEOUS) ×4 IMPLANT
CANNULA GUNDRY RCSP 15FR (MISCELLANEOUS) ×4 IMPLANT
CATH CPB KIT VANTRIGT (MISCELLANEOUS) ×4 IMPLANT
CATH ROBINSON RED A/P 18FR (CATHETERS) ×12 IMPLANT
CATH THORACIC 36FR RT ANG (CATHETERS) ×4 IMPLANT
CLIP RETRACTION 3.0MM CORONARY (MISCELLANEOUS) ×4 IMPLANT
CLIP TI WIDE RED SMALL 24 (CLIP) ×2 IMPLANT
COVER SURGICAL LIGHT HANDLE (MISCELLANEOUS) ×1 IMPLANT
CRADLE DONUT ADULT HEAD (MISCELLANEOUS) ×4 IMPLANT
DERMABOND ADVANCED (GAUZE/BANDAGES/DRESSINGS) ×4
DERMABOND ADVANCED .7 DNX12 (GAUZE/BANDAGES/DRESSINGS) IMPLANT
DRAIN CHANNEL 32F RND 10.7 FF (WOUND CARE) ×4 IMPLANT
DRAPE CARDIOVASCULAR INCISE (DRAPES) ×4
DRAPE SLUSH/WARMER DISC (DRAPES) ×4 IMPLANT
DRAPE SRG 135X102X78XABS (DRAPES) ×2 IMPLANT
DRSG AQUACEL AG ADV 3.5X14 (GAUZE/BANDAGES/DRESSINGS) ×4 IMPLANT
ELECT BLADE 4.0 EZ CLEAN MEGAD (MISCELLANEOUS) ×4
ELECT BLADE 6.5 EXT (BLADE) ×4 IMPLANT
ELECT CAUTERY BLADE 6.4 (BLADE) ×4 IMPLANT
ELECT REM PT RETURN 9FT ADLT (ELECTROSURGICAL) ×8
ELECTRODE BLDE 4.0 EZ CLN MEGD (MISCELLANEOUS) ×2 IMPLANT
ELECTRODE REM PT RTRN 9FT ADLT (ELECTROSURGICAL) ×4 IMPLANT
GAUZE SPONGE 4X4 12PLY STRL (GAUZE/BANDAGES/DRESSINGS) ×8 IMPLANT
GLOVE BIO SURGEON STRL SZ 6 (GLOVE) ×4 IMPLANT
GLOVE BIO SURGEON STRL SZ7.5 (GLOVE) ×14 IMPLANT
GLOVE BIOGEL PI IND STRL 6 (GLOVE) ×2 IMPLANT
GLOVE BIOGEL PI IND STRL 6.5 (GLOVE) IMPLANT
GLOVE BIOGEL PI IND STRL 7.0 (GLOVE) IMPLANT
GLOVE BIOGEL PI INDICATOR 6 (GLOVE) ×4
GLOVE BIOGEL PI INDICATOR 6.5 (GLOVE) ×4
GLOVE BIOGEL PI INDICATOR 7.0 (GLOVE) ×18
GOWN STRL REUS W/ TWL LRG LVL3 (GOWN DISPOSABLE) ×8 IMPLANT
GOWN STRL REUS W/TWL LRG LVL3 (GOWN DISPOSABLE) ×40
HEMOSTAT POWDER SURGIFOAM 1G (HEMOSTASIS) ×12 IMPLANT
HEMOSTAT SURGICEL 2X14 (HEMOSTASIS) ×4 IMPLANT
INSERT FOGARTY XLG (MISCELLANEOUS) IMPLANT
KIT BASIN OR (CUSTOM PROCEDURE TRAY) ×4 IMPLANT
KIT ROOM TURNOVER OR (KITS) ×4 IMPLANT
KIT SUCTION CATH 14FR (SUCTIONS) ×4 IMPLANT
KIT VASOVIEW W/TROCAR VH 2000 (KITS) ×4 IMPLANT
LEAD PACING MYOCARDI (MISCELLANEOUS) ×4 IMPLANT
MARKER GRAFT CORONARY BYPASS (MISCELLANEOUS) ×12 IMPLANT
NS IRRIG 1000ML POUR BTL (IV SOLUTION) ×22 IMPLANT
PACK OPEN HEART (CUSTOM PROCEDURE TRAY) ×4 IMPLANT
PAD ARMBOARD 7.5X6 YLW CONV (MISCELLANEOUS) ×8 IMPLANT
PAD ELECT DEFIB RADIOL ZOLL (MISCELLANEOUS) ×4 IMPLANT
PENCIL BUTTON HOLSTER BLD 10FT (ELECTRODE) ×4 IMPLANT
PUNCH AORTIC ROTATE 4.0MM (MISCELLANEOUS) ×2 IMPLANT
PUNCH AORTIC ROTATE 4.5MM 8IN (MISCELLANEOUS) IMPLANT
PUNCH AORTIC ROTATE 5MM 8IN (MISCELLANEOUS) IMPLANT
SET CARDIOPLEGIA MPS 5001102 (MISCELLANEOUS) ×3 IMPLANT
SPONGE GAUZE 4X4 12PLY STER LF (GAUZE/BANDAGES/DRESSINGS) ×6 IMPLANT
SPONGE LAP 4X18 X RAY DECT (DISPOSABLE) ×3 IMPLANT
SURGIFLO W/THROMBIN 8M KIT (HEMOSTASIS) ×4 IMPLANT
SUT BONE WAX W31G (SUTURE) ×4 IMPLANT
SUT MNCRL AB 4-0 PS2 18 (SUTURE) ×6 IMPLANT
SUT PROLENE 3 0 SH DA (SUTURE) ×2 IMPLANT
SUT PROLENE 3 0 SH1 36 (SUTURE) IMPLANT
SUT PROLENE 4 0 RB 1 (SUTURE) ×4
SUT PROLENE 4 0 SH DA (SUTURE) ×4 IMPLANT
SUT PROLENE 4-0 RB1 .5 CRCL 36 (SUTURE) ×2 IMPLANT
SUT PROLENE 5 0 C 1 36 (SUTURE) IMPLANT
SUT PROLENE 6 0 C 1 30 (SUTURE) IMPLANT
SUT PROLENE 6 0 CC (SUTURE) ×12 IMPLANT
SUT PROLENE 8 0 BV175 6 (SUTURE) IMPLANT
SUT PROLENE BLUE 7 0 (SUTURE) ×6 IMPLANT
SUT SILK  1 MH (SUTURE)
SUT SILK 1 MH (SUTURE) IMPLANT
SUT SILK 2 0 SH CR/8 (SUTURE) ×2 IMPLANT
SUT SILK 3 0 SH CR/8 (SUTURE) IMPLANT
SUT STEEL 6MS V (SUTURE) ×8 IMPLANT
SUT STEEL SZ 6 DBL 3X14 BALL (SUTURE) ×4 IMPLANT
SUT VIC AB 1 CTX 18 (SUTURE) ×3 IMPLANT
SUT VIC AB 1 CTX 36 (SUTURE) ×12
SUT VIC AB 1 CTX36XBRD ANBCTR (SUTURE) ×4 IMPLANT
SUT VIC AB 2-0 CT1 27 (SUTURE) ×8
SUT VIC AB 2-0 CT1 TAPERPNT 27 (SUTURE) IMPLANT
SUT VIC AB 2-0 CTX 27 (SUTURE) IMPLANT
SUT VIC AB 3-0 SH 27 (SUTURE) ×4
SUT VIC AB 3-0 SH 27X BRD (SUTURE) IMPLANT
SUT VIC AB 3-0 X1 27 (SUTURE) IMPLANT
SUTURE E-PAK OPEN HEART (SUTURE) ×4 IMPLANT
SYSTEM SAHARA CHEST DRAIN ATS (WOUND CARE) ×4 IMPLANT
TAPE CLOTH SURG 4X10 WHT LF (GAUZE/BANDAGES/DRESSINGS) ×9 IMPLANT
TOWEL OR 17X24 6PK STRL BLUE (TOWEL DISPOSABLE) ×8 IMPLANT
TOWEL OR 17X26 10 PK STRL BLUE (TOWEL DISPOSABLE) ×8 IMPLANT
TRAY FOLEY IC TEMP SENS 16FR (CATHETERS) ×4 IMPLANT
TUBING INSUFFLATION (TUBING) ×4 IMPLANT
UNDERPAD 30X30 INCONTINENT (UNDERPADS AND DIAPERS) ×4 IMPLANT
WATER STERILE IRR 1000ML POUR (IV SOLUTION) ×8 IMPLANT

## 2014-10-29 NOTE — Progress Notes (Signed)
Per EKG, rhythm is atrial fib.  Pacer not capturing well in any mode.  Dr. Prescott Gum made aware. Pacer turned off and 150 mg amiodarone bolus given per order. Will continue to monitor.

## 2014-10-29 NOTE — Anesthesia Postprocedure Evaluation (Signed)
  Anesthesia Post-op Note  Patient: Karina Green  Procedure(s) Performed: Procedure(s) (LRB): CORONARY ARTERY BYPASS GRAFTING (CABG) TIMES TWO USING LEFT INTERNAL MAMMARY ARTERY AND RIGHT LEG GREATER SAPHENOUS VEIN HARVESTED ENDOSCOPICALLY. (N/A) INTRAOPERATIVE TRANSESOPHAGEAL ECHOCARDIOGRAM (N/A)  Patient Location: ICU  Anesthesia Type: General  Level of Consciousness: sedated  Airway and Oxygen Therapy: Patient mechanically ventilated  Post-op Pain: mild  Post-op Assessment: Post-op Vital signs reviewed, Patient's Cardiovascular Status Stable, Respiratory Function Stable, Patent Airway and No signs of Nausea or vomiting  Last Vitals:  Filed Vitals:   10/29/14 0509  BP: 122/60  Pulse: 57  Temp: 36.7 C  Resp: 16    Post-op Vital Signs: stable   Complications: No apparent anesthesia complications

## 2014-10-29 NOTE — Anesthesia Procedure Notes (Signed)
Procedure Name: Intubation Date/Time: 10/29/2014 7:48 AM Performed by: Susa Loffler Pre-anesthesia Checklist: Patient identified, Timeout performed, Emergency Drugs available, Suction available and Patient being monitored Patient Re-evaluated:Patient Re-evaluated prior to inductionOxygen Delivery Method: Circle system utilized Preoxygenation: Pre-oxygenation with 100% oxygen Intubation Type: IV induction Ventilation: Mask ventilation without difficulty Laryngoscope Size: Mac and 3 Grade View: Grade I Tube type: Oral Tube size: 8.0 mm Number of attempts: 1 Airway Equipment and Method: Stylet Placement Confirmation: ETT inserted through vocal cords under direct vision,  positive ETCO2 and breath sounds checked- equal and bilateral Secured at: 22 cm Tube secured with: Tape Dental Injury: Teeth and Oropharynx as per pre-operative assessment

## 2014-10-29 NOTE — Anesthesia Preprocedure Evaluation (Addendum)
Anesthesia Evaluation  Patient identified by MRN, date of birth, ID band Patient awake    Reviewed: Allergy & Precautions, H&P , NPO status , Patient's Chart, lab work & pertinent test results  Airway Mallampati: II  TM Distance: <3 FB Neck ROM: Full    Dental no notable dental hx.    Pulmonary neg pulmonary ROS,  breath sounds clear to auscultation  Pulmonary exam normal       Cardiovascular hypertension, Pt. on medications + angina + CAD Rhythm:Regular Rate:Normal  Study Conclusions  - Left ventricle: The cavity size was normal. Wall thickness was increased in a pattern of mild LVH. Systolic function was normal. The estimated ejection fraction was in the range of 60% to 65%. Diastolic function is abnormal, indeterminant grade. Wall motion was normal; there were no regional wall motion abnormalities. - Aortic valve: Moderately calcified annulus. Trileaflet; mildly thickened leaflets. There is aortic sclerosis without significant stenosis. There was trivial regurgitation. Valve area (VTI): 1.79 cm^2. Valve area (Vmax): 1.82 cm^2. - Mitral valve: Mildly calcified annulus. Mildly thickened leaflets   Neuro/Psych negative neurological ROS  negative psych ROS   GI/Hepatic negative GI ROS, Neg liver ROS,   Endo/Other  diabetes, Insulin DependentMorbid obesity  Renal/GU negative Renal ROS  negative genitourinary   Musculoskeletal negative musculoskeletal ROS (+)   Abdominal   Peds negative pediatric ROS (+)  Hematology  (+) anemia ,   Anesthesia Other Findings   Reproductive/Obstetrics negative OB ROS                            Anesthesia Physical Anesthesia Plan  ASA: III  Anesthesia Plan: General   Post-op Pain Management:    Induction: Intravenous  Airway Management Planned: Oral ETT  Additional Equipment: Arterial line, PA Cath and TEE  Intra-op Plan:    Post-operative Plan: Post-operative intubation/ventilation  Informed Consent: I have reviewed the patients History and Physical, chart, labs and discussed the procedure including the risks, benefits and alternatives for the proposed anesthesia with the patient or authorized representative who has indicated his/her understanding and acceptance.   Dental advisory given  Plan Discussed with: CRNA and Surgeon  Anesthesia Plan Comments:         Anesthesia Quick Evaluation

## 2014-10-29 NOTE — Progress Notes (Signed)
The patient was examined and preop studies reviewed. There has been no change from the prior exam and the patient is ready for surgery.   Plan CABG on [place] today

## 2014-10-29 NOTE — Progress Notes (Signed)
RT note-ETT withdrawn from 22cm to 19cm at the lip. BBS equal per Dr. Darcey Nora.

## 2014-10-29 NOTE — Procedures (Signed)
Extubation Procedure Note  Patient Details:   Name: Karina Green DOB: 22-Feb-1956 MRN: [id number]   Airway Documentation:     Evaluation  O2 sats: stable throughout Complications: No apparent complications Patient did tolerate procedure well. Bilateral Breath Sounds: Clear   Yes   Placed on 4L [place]. Tolerating well.  Able to speak.  Beatris Si D 10/29/2014, 7:03 PM

## 2014-10-29 NOTE — Progress Notes (Signed)
[facility] BRIEF SICU PROGRESS NOTE  Day of Surgery  S/P Procedure(s) (LRB): CORONARY ARTERY BYPASS GRAFTING (CABG) TIMES TWO USING LEFT INTERNAL MAMMARY ARTERY AND RIGHT LEG GREATER SAPHENOUS VEIN HARVESTED ENDOSCOPICALLY. (N/A) INTRAOPERATIVE TRANSESOPHAGEAL ECHOCARDIOGRAM (N/A)   Just extubated, neuro grossly intact AAI paced w/ stable hemodynamics Chest tube output low UOP adequate Labs okay  Plan: Continue routine early postop  Karina Green 10/29/2014 7:20 PM

## 2014-10-29 NOTE — Transfer of Care (Signed)
Immediate Anesthesia Transfer of Care Note  Patient: Karina Green  Procedure(s) Performed: Procedure(s): CORONARY ARTERY BYPASS GRAFTING (CABG) TIMES TWO USING LEFT INTERNAL MAMMARY ARTERY AND RIGHT LEG GREATER SAPHENOUS VEIN HARVESTED ENDOSCOPICALLY. (N/A) INTRAOPERATIVE TRANSESOPHAGEAL ECHOCARDIOGRAM (N/A)  Patient Location: SICU  Anesthesia Type:General  Level of Consciousness: sedated and Patient remains intubated per anesthesia plan  Airway & Oxygen Therapy: Patient remains intubated per anesthesia plan and Patient placed on Ventilator (see vital sign flow sheet for setting)  Post-op Assessment: Report given to PACU RN and Post -op Vital signs reviewed and stable  Post vital signs: Reviewed and stable  Complications: No apparent anesthesia complications

## 2014-10-29 NOTE — Progress Notes (Signed)
AM dose of metoprolol held.  Pt heart rate fluctuating between 51-55 while pt is bathing.  Claudette Stapler, RN

## 2014-10-29 NOTE — Progress Notes (Signed)
*  PRELIMINARY RESULTS* Echocardiogram Echocardiogram Transesophageal has been performed.  Leavy Cella 10/29/2014, 9:14 AM

## 2014-10-29 NOTE — Progress Notes (Signed)
RT note- increased fio2 and RR post ABG.

## 2014-10-29 NOTE — Brief Op Note (Addendum)
10/24/2014 - 10/29/2014  11:34 AM  PATIENT:  Karina Green  58 y.o. female  PRE-OPERATIVE DIAGNOSIS:  Multivessel CAD (Left main disease included)  POST-OPERATIVE DIAGNOSIS:  Multivessel CAD  (Left main disease included)   PROCEDURE:INTRAOPERATIVE TRANSESOPHAGEAL ECHOCARDIOGRAM, CORONARY ARTERY BYPASS GRAFTING (CABG) x 2 (LIMA to LAD and SVG to OM) with EVH from RIGHT THIGH   SURGEON:  Surgeon(s) and Role:    * Ivin Poot, MD - Primary  PHYSICIAN ASSISTANT: Lars Pinks PA-C  ANESTHESIA:   general  EBL:  Total I/O In: 1000 [I.V.:1000] Out: 1350 [Urine:1350]   DRAINS: Chest tubes placed in the mediastinal and pleural spaces   COUNTS:  YES  DICTATION: .Dragon Dictation  PLAN OF CARE: Admit to inpatient   PATIENT DISPOSITION:  ICU - intubated and hemodynamically stable.   Delay start of Pharmacological VTE agent (>24hrs) due to surgical blood loss or risk of bleeding: yes  BASELINE WEIGHT: 99 kg

## 2014-10-30 ENCOUNTER — Inpatient Hospital Stay (HOSPITAL_COMMUNITY): Payer: No Typology Code available for payment source

## 2014-10-30 ENCOUNTER — Encounter (HOSPITAL_COMMUNITY): Payer: Self-pay | Admitting: Cardiothoracic Surgery

## 2014-10-30 DIAGNOSIS — R739 Hyperglycemia, unspecified: Secondary | ICD-10-CM

## 2014-10-30 LAB — TYPE AND SCREEN
ABO/RH(D): A POS
Antibody Screen: NEGATIVE
Unit division: 0
Unit division: 0
Unit division: 0
Unit division: 0

## 2014-10-30 LAB — GLUCOSE, CAPILLARY
GLUCOSE-CAPILLARY: 118 mg/dL — AB (ref 70–99)
GLUCOSE-CAPILLARY: 101 mg/dL — AB (ref 70–99)
GLUCOSE-CAPILLARY: 121 mg/dL — AB (ref 70–99)
GLUCOSE-CAPILLARY: 140 mg/dL — AB (ref 70–99)
GLUCOSE-CAPILLARY: 102 mg/dL — AB (ref 70–99)
GLUCOSE-CAPILLARY: 100 mg/dL — AB (ref 70–99)
GLUCOSE-CAPILLARY: 101 mg/dL — AB (ref 70–99)
GLUCOSE-CAPILLARY: 114 mg/dL — AB (ref 70–99)
GLUCOSE-CAPILLARY: 164 mg/dL — AB (ref 70–99)
GLUCOSE-CAPILLARY: 101 mg/dL — AB (ref 70–99)
Glucose-Capillary: 100 mg/dL — ABNORMAL HIGH (ref 70–99)
Glucose-Capillary: 103 mg/dL — ABNORMAL HIGH (ref 70–99)
Glucose-Capillary: 104 mg/dL — ABNORMAL HIGH (ref 70–99)
Glucose-Capillary: 109 mg/dL — ABNORMAL HIGH (ref 70–99)
Glucose-Capillary: 111 mg/dL — ABNORMAL HIGH (ref 70–99)
Glucose-Capillary: 116 mg/dL — ABNORMAL HIGH (ref 70–99)
Glucose-Capillary: 125 mg/dL — ABNORMAL HIGH (ref 70–99)
Glucose-Capillary: 75 mg/dL (ref 70–99)
Glucose-Capillary: 78 mg/dL (ref 70–99)
Glucose-Capillary: 83 mg/dL (ref 70–99)
Glucose-Capillary: 92 mg/dL (ref 70–99)

## 2014-10-30 LAB — POCT I-STAT, CHEM 8
BUN: 12 mg/dL (ref 6–23)
CALCIUM ION: 1.18 mmol/L (ref 1.13–1.30)
CREATININE: 1.3 mg/dL — AB (ref 0.50–1.10)
Chloride: 102 mEq/L (ref 96–112)
GLUCOSE: 138 mg/dL — AB (ref 70–99)
HCT: 28 % — ABNORMAL LOW (ref 36.0–46.0)
HEMOGLOBIN: 9.5 g/dL — AB (ref 12.0–15.0)
Potassium: 3.9 mEq/L (ref 3.7–5.3)
Sodium: 139 mEq/L (ref 137–147)
TCO2: 21 mmol/L (ref 0–100)

## 2014-10-30 LAB — CREATININE, SERUM
Creatinine, Ser: 1.24 mg/dL — ABNORMAL HIGH (ref 0.50–1.10)
GFR calc Af Amer: 54 mL/min — ABNORMAL LOW (ref >90)
GFR, EST NON AFRICAN AMERICAN: 46 mL/min — AB (ref >90)

## 2014-10-30 LAB — MAGNESIUM
Magnesium: 2.2 mg/dL (ref 1.5–2.5)
Magnesium: 2 mg/dL (ref 1.5–2.5)

## 2014-10-30 LAB — CBC
HCT: 26.1 % — ABNORMAL LOW (ref 36.0–46.0)
HEMATOCRIT: 27.3 % — AB (ref 36.0–46.0)
HEMOGLOBIN: 8.4 g/dL — AB (ref 12.0–15.0)
HEMOGLOBIN: 8.8 g/dL — AB (ref 12.0–15.0)
MCH: 27.4 pg (ref 26.0–34.0)
MCH: 27.2 pg (ref 26.0–34.0)
MCHC: 32.2 g/dL (ref 30.0–36.0)
MCHC: 32.2 g/dL (ref 30.0–36.0)
MCV: 85 fL (ref 78.0–100.0)
MCV: 84.3 fL (ref 78.0–100.0)
PLATELETS: 158 10*3/uL (ref 150–400)
Platelets: 156 10*3/uL (ref 150–400)
RBC: 3.07 MIL/uL — AB (ref 3.87–5.11)
RBC: 3.24 MIL/uL — ABNORMAL LOW (ref 3.87–5.11)
RDW: 15.3 % (ref 11.5–15.5)
RDW: 15.7 % — AB (ref 11.5–15.5)
WBC: 12.9 10*3/uL — AB (ref 4.0–10.5)
WBC: 14 10*3/uL — ABNORMAL HIGH (ref 4.0–10.5)

## 2014-10-30 LAB — BASIC METABOLIC PANEL
ANION GAP: 15 (ref 5–15)
BUN: 13 mg/dL (ref 6–23)
CALCIUM: 8.2 mg/dL — AB (ref 8.4–10.5)
CHLORIDE: 104 meq/L (ref 96–112)
CO2: 20 mEq/L (ref 19–32)
Creatinine, Ser: 1.12 mg/dL — ABNORMAL HIGH (ref 0.50–1.10)
GFR calc Af Amer: 61 mL/min — ABNORMAL LOW (ref >90)
GFR calc non Af Amer: 52 mL/min — ABNORMAL LOW (ref >90)
GLUCOSE: 109 mg/dL — AB (ref 70–99)
POTASSIUM: 4.3 meq/L (ref 3.7–5.3)
SODIUM: 139 meq/L (ref 137–147)

## 2014-10-30 MED ORDER — HEPARIN (PORCINE) IN NACL 2-0.9 UNIT/ML-% IJ SOLN
INTRAMUSCULAR | Status: AC
  Filled 2014-10-30: qty 1000

## 2014-10-30 MED ORDER — MIDAZOLAM HCL 2 MG/2ML IJ SOLN
INTRAMUSCULAR | Status: AC
  Filled 2014-10-30: qty 2

## 2014-10-30 MED ORDER — FUROSEMIDE 10 MG/ML IJ SOLN
40.0000 mg | Freq: Once | INTRAMUSCULAR | Status: AC
  Administered 2014-10-30: 40 mg via INTRAVENOUS

## 2014-10-30 MED ORDER — VERAPAMIL HCL 2.5 MG/ML IV SOLN
INTRAVENOUS | Status: AC
  Filled 2014-10-30: qty 2

## 2014-10-30 MED ORDER — OXYCODONE HCL 5 MG PO TABS
5.0000 mg | ORAL_TABLET | ORAL | Status: DC | PRN
  Administered 2014-10-30 – 2014-11-03 (×9): 5 mg via ORAL
  Filled 2014-10-30 (×9): qty 1

## 2014-10-30 MED ORDER — MORPHINE SULFATE 2 MG/ML IJ SOLN: 2.0000 mg | INTRAMUSCULAR | Status: DC | PRN

## 2014-10-30 MED ORDER — INSULIN ASPART 100 UNIT/ML ~~LOC~~ SOLN
0.0000 [IU] | SUBCUTANEOUS | Status: DC
  Administered 2014-10-30: 2 [IU] via SUBCUTANEOUS
  Administered 2014-10-30: 4 [IU] via SUBCUTANEOUS
  Administered 2014-10-31: 2 [IU] via SUBCUTANEOUS

## 2014-10-30 MED ORDER — LIDOCAINE HCL (PF) 1 % IJ SOLN
INTRAMUSCULAR | Status: AC
  Filled 2014-10-30: qty 30

## 2014-10-30 MED ORDER — AMIODARONE HCL 200 MG PO TABS
200.0000 mg | ORAL_TABLET | Freq: Two times a day (BID) | ORAL | Status: DC
  Administered 2014-10-30 (×2): 200 mg via ORAL
  Filled 2014-10-30 (×4): qty 1

## 2014-10-30 MED ORDER — ENOXAPARIN SODIUM 30 MG/0.3ML ~~LOC~~ SOLN
30.0000 mg | SUBCUTANEOUS | Status: DC
  Administered 2014-10-30 – 2014-11-01 (×3): 30 mg via SUBCUTANEOUS
  Filled 2014-10-30 (×5): qty 0.3

## 2014-10-30 MED ORDER — INSULIN DETEMIR 100 UNIT/ML ~~LOC~~ SOLN
14.0000 [IU] | Freq: Two times a day (BID) | SUBCUTANEOUS | Status: DC
Start: 2014-10-30 — End: 2014-10-31
  Administered 2014-10-30 (×2): 14 [IU] via SUBCUTANEOUS
  Filled 2014-10-30 (×4): qty 0.14

## 2014-10-30 MED ORDER — INSULIN DETEMIR 100 UNIT/ML ~~LOC~~ SOLN: 14.0000 [IU] | Freq: Two times a day (BID) | SUBCUTANEOUS | Status: DC

## 2014-10-30 MED ORDER — HEPARIN SODIUM (PORCINE) 1000 UNIT/ML IJ SOLN
INTRAMUSCULAR | Status: AC
  Filled 2014-10-30: qty 1

## 2014-10-30 MED ORDER — NITROGLYCERIN 1 MG/10 ML FOR IR/CATH LAB
INTRA_ARTERIAL | Status: AC
Start: 2014-10-30 — End: 2014-10-30
  Filled 2014-10-30: qty 10

## 2014-10-30 MED FILL — Magnesium Sulfate Inj 50%: INTRAMUSCULAR | Qty: 10 | Status: AC

## 2014-10-30 MED FILL — Mannitol IV Soln 20%: INTRAVENOUS | Qty: 500 | Status: AC

## 2014-10-30 MED FILL — Electrolyte-R (PH 7.4) Solution: INTRAVENOUS | Qty: 3000 | Status: AC

## 2014-10-30 MED FILL — Potassium Chloride Inj 2 mEq/ML: INTRAVENOUS | Qty: 40 | Status: AC

## 2014-10-30 MED FILL — Sodium Bicarbonate IV Soln 8.4%: INTRAVENOUS | Qty: 50 | Status: AC

## 2014-10-30 MED FILL — Sodium Chloride IV Soln 0.9%: INTRAVENOUS | Qty: 2000 | Status: AC

## 2014-10-30 MED FILL — Heparin Sodium (Porcine) Inj 1000 Unit/ML: INTRAMUSCULAR | Qty: 30 | Status: AC

## 2014-10-30 MED FILL — Lidocaine HCl IV Inj 20 MG/ML: INTRAVENOUS | Qty: 5 | Status: AC

## 2014-10-30 MED FILL — Heparin Sodium (Porcine) Inj 1000 Unit/ML: INTRAMUSCULAR | Qty: 10 | Status: AC

## 2014-10-30 NOTE — Plan of Care (Signed)
Problem: Phase II - Intermediate Post-Op Goal: Wean to Extubate Outcome: Completed/Met Date Met:  10/30/14 Pt extubated @1900 Goal: Maintain Hemodynamic Stability Outcome: Progressing Goal: CBGs/Blood Glucose per SCIP Criteria Outcome: Progressing Maintain via IV infusion Goal: Pain controlled with appropriate interventions Outcome: Progressing Pain maintained with PO tylenol Goal: Advance Diet Outcome: Progressing Pt tolerating CL diet     

## 2014-10-30 NOTE — Progress Notes (Signed)
Utilization Review Completed.  

## 2014-10-30 NOTE — Progress Notes (Signed)
1 Day Post-Op Procedure(s) (LRB): CORONARY ARTERY BYPASS GRAFTING (CABG) TIMES TWO USING LEFT INTERNAL MAMMARY ARTERY AND RIGHT LEG GREATER SAPHENOUS VEIN HARVESTED ENDOSCOPICALLY. (N/A) INTRAOPERATIVE TRANSESOPHAGEAL ECHOCARDIOGRAM (N/A) Subjective: Patient alert but appears depressed, non verbal nsr on amio drip  Objective: Vital signs in last 24 hours: Temp:  [95.2 F (35.1 C)-97.9 F (36.6 C)] 97.9 F (36.6 C) (11/10 0830) Pulse Rate:  [63-91] 65 (11/10 0830) Cardiac Rhythm:  [-] Normal sinus rhythm (11/10 0700) Resp:  [10-29] 14 (11/10 0830) BP: (82-106)/(50-77) 100/64 mmHg (11/10 0800) SpO2:  [92 %-100 %] 100 % (11/10 0830) Arterial Line BP: (87-126)/(49-75) 122/53 mmHg (11/10 0830) FiO2 (%):  [40 %-70 %] 40 % (11/09 1826) Weight:  [229 lb 11.5 oz (104.2 kg)] 229 lb 11.5 oz (104.2 kg) (11/10 0600)  Hemodynamic parameters for last 24 hours: PAP: (23-43)/(10-30) 37/17 mmHg CO:  [2.8 L/min-6.7 L/min] 4.7 L/min CI:  [1.4 L/min/m2-3.3 L/min/m2] 2.3 L/min/m2  Intake/Output from previous day: 11/09 0701 - 11/10 0700 In: 5728.3 [I.V.:3493.3; Blood:375; NG/GT:30; IV Piggyback:1830] Out: 5530 [Urine:3655; Blood:1475; Chest Tube:400] Intake/Output this shift:    EXAM Lungs clear extrem warm abd soft  Lab Results:  Recent Labs  10/29/14 1845 10/29/14 1853 10/30/14 0340  WBC 10.1  --  12.9*  HGB 8.5* 10.5* 8.4*  HCT 26.1* 31.0* 26.1*  PLT 127*  --  158   BMET:  Recent Labs  10/29/14 1853 10/30/14 0340  NA 141 139  K 4.5 4.3  CL 108 104  CO2  --  20  GLUCOSE 110* 109*  BUN 12 13  CREATININE 1.00 1.12*  CALCIUM  --  8.2*    PT/INR:  Recent Labs  10/29/14 1325  LABPROT 17.9*  INR 1.46   ABG    Component Value Date/Time   PHART 7.365 10/29/2014 2020   HCO3 22.2 10/29/2014 2020   TCO2 23 10/29/2014 2020   ACIDBASEDEF 3.0* 10/29/2014 2020   O2SAT 96.0 10/29/2014 2020   CBG (last 3)   Recent Labs  10/29/14 2233 10/29/14 2343 10/30/14 0122   GLUCAP 102* 92 100*    Assessment/Plan: S/P Procedure(s) (LRB): CORONARY ARTERY BYPASS GRAFTING (CABG) TIMES TWO USING LEFT INTERNAL MAMMARY ARTERY AND RIGHT LEG GREATER SAPHENOUS VEIN HARVESTED ENDOSCOPICALLY. (N/A) INTRAOPERATIVE TRANSESOPHAGEAL ECHOCARDIOGRAM (N/A) Wt up -- lasix ordered Progression orders lantus plus SSI for hyperglycemia   LOS: 6 days    Karina Green,Karina Green 10/30/2014

## 2014-10-30 NOTE — Progress Notes (Signed)
POD # 1 CABG  Resting comfortably  BP 127/108 mmHg  Pulse 66  Temp(Src) 97.9 F (36.6 C) (Oral)  Resp 24  Ht 5\' 3"  (1.6 m)  Wt 229 lb 11.5 oz (104.2 kg)  BMI 40.70 kg/m2  SpO2 98%   Intake/Output Summary (Last 24 hours) at 10/30/14 1949 Last data filed at 10/30/14 1700  Gross per 24 hour  Intake 1784.42 ml  Output   3155 ml  Net -1370.58 ml   Creatinine up slightly to 1.3 Hct = 28  Doing well POD # 1

## 2014-10-30 NOTE — Op Note (Signed)
**Note Karina Green via Obfuscation** Karina Green NO.:  [id number]  MEDICAL RECORD NO.:  [id number]  LOCATION:  2S05C                        FACILITY:  [facility]  PHYSICIAN:  Ivin Poot, M.D.  DATE OF BIRTH:  September 03, 1956  DATE OF PROCEDURE:  10/29/2014 DATE OF DISCHARGE:                              OPERATIVE REPORT   OPERATIONS: 1. Coronary artery bypass grafting x2 (left internal mammary artery to     left anterior descending artery, saphenous vein graft to obtuse     marginal). 2. Endoscopic harvest of right leg greater saphenous vein, exposure of     left leg greater saphenous vein, which was inadequate for conduit.  PREOPERATIVE DIAGNOSIS:  Class IV unstable angina with left main stenosis.  POSTOPERATIVE DIAGNOSIS:  Class IV unstable angina with left main stenosis.  SURGEON:  Ivin Poot, M.D.  ASSISTANT:  Lars Pinks, PA-C.  ANESTHESIA:  General.  INDICATIONS:  The patient is a 58 year old obese, Caucasian female with hypertension and strong family history for CAD.  She presented to the hospital with several days of progressive - unstable angina, and was found to have negative cardiac enzymes.  However, cardiac catheterization demonstrated a 70% to 75% left main stenosis.  LV function was fairly well preserved.  She was felt to be candidate for surgical coronary revascularization.  I examined the patient and reviewed the results of her cardiac catheterization and 2D echocardiogram with the patient and family.  I discussed the indications and expected benefits of coronary artery bypass grafting for treatment of her coronary artery disease.  I reviewed the major aspects of the operation including the use of general anesthesia and cardiopulmonary bypass, the location of the surgical incisions, the expected postoperative recovery, and the risks of bleeding, requiring blood transfusion, wound infection, stroke, MI, postop pleural effusion, postop ventilator  dependence, arrhythmia, and death.  After reviewing these issues, she demonstrated her understanding and agreed to proceed with surgery under what I felt was an informed consent.  OPERATIVE FINDINGS: 1. Suboptimal quality of saphenous vein, but adequate conduit found on     the right thigh - left thigh explored, but had an adequate conduit. 2. Small mammary artery, but with adequate flow. 3. Deeply intramyocardial LAD - redo grafting would not be an option. 4. No blood products required for this operation.  DESCRIPTION OF PROCEDURE:  The patient was brought to the operating room and placed supine on the operating table where general anesthesia was induced under invasive hemodynamic monitoring.  The chest, abdomen, and legs were prepped with Betadine and draped as a sterile field.  A proper time-out was performed.  A sternal incision was made as the saphenous vein was harvested endoscopically from the right leg.  The left internal mammary artery was harvested as a pedicle graft from its origin at the subclavian vessels.  There was good vessel with adequate flow.  The sternal retractor was placed using the deep blades.  The pericardium was opened and suspended.  Pursestrings were placed in the ascending aorta and right atrium, and after the vein had been harvested, heparin was administered and the ACT was documented as being therapeutic for cardiopulmonary bypass.  The  patient was then cannulated and placed on bypass.  The coronary arteries were identified for grafting.  Exposure of the heart was difficult due to the patient's obese and short body habitus.  Pursestrings were placed for antegrade and retrograde cold blood cardioplegia and the patient was cooled to 32 degrees.  The aortic crossclamp was applied.  One liter of cold blood cardioplegia was delivered in split doses between the antegrade aortic and retrograde coronary sinus catheters.  There was good cardioplegic arrest and  septal temperature dropped less than 14 degrees.  Cardioplegia was delivered every 20 minutes or less while the crossclamp was placed.  The distal coronary anastomoses were performed.  The first distal anastomosis was to the OM1 branch.  This had a proximal 75% left main stenosis.  A reverse saphenous vein was sewn end-to-side with running 7- 0 Prolene with good flow through the graft.  Cardioplegia was redosed.  The second distal anastomosis was to the mid-LAD.  Distally, the LAD was too small to graft proximally and then the mid septal area was deeply intramyocardial.  It was identified with difficulty.  The left IMA pedicle was brought down to the LAD and sewn end-to-side with running 8- 0 Prolene.  The LAD was a 1.4-mm vessel.  There was good flow through the anastomosis and the pedicle was secured to the epicardium with 6-0 Prolene tacking sutures.  The bulldog was reapplied and cardioplegia was redosed.  While the crossclamp was still in place, the proximal vein anastomosis was performed on the ascending aorta using a 4.5-mm punch and running 6- 0 Prolene.  Air was vented from the coronaries with the dose of retrograde blood cardioplegia.  Then, the proximal anastomosis was tied and the crossclamp was removed.  The heart resumed a spontaneous rhythm.  The proximal and distal anastomoses were checked and found to be hemostatic.  Temporary pacing wires were applied.  The patient was rewarmed and reperfused.  The lungs were expanded and the ventilator was resumed.  The patient was then weaned from cardiopulmonary bypass without needing inotropes.  Global LV function was normal by echo.  Hemodynamics were normal.  Protamine was administered without adverse reaction.  The cannulas were removed. Hemostasis was achieved.  The superior pericardial fat was closed over the aorta.  Anterior mediastinal and left pleural chest tubes were placed and brought out through separate incisions.   The sternum was closed with interrupted steel wire.  The chest wound was irrigated with warm saline.  The pectoralis fascia was closed with interrupted and running #1 Vicryl.  The subcutaneous and skin layers were closed using running Vicryl and sterile dressings were applied.  Total cardiopulmonary bypass time was 80 minutes.     Ivin Poot, M.D.     PV/MEDQ  D:  10/29/2014  T:  10/29/2014  Job:  [id number]  cc:   Jerline Pain, MD

## 2014-10-31 ENCOUNTER — Inpatient Hospital Stay (HOSPITAL_COMMUNITY): Payer: No Typology Code available for payment source

## 2014-10-31 DIAGNOSIS — I25110 Atherosclerotic heart disease of native coronary artery with unstable angina pectoris: Principal | ICD-10-CM

## 2014-10-31 LAB — BASIC METABOLIC PANEL
Anion gap: 15 (ref 5–15)
BUN: 15 mg/dL (ref 6–23)
CO2: 23 mEq/L (ref 19–32)
Calcium: 8.4 mg/dL (ref 8.4–10.5)
Chloride: 103 mEq/L (ref 96–112)
Creatinine, Ser: 1.21 mg/dL — ABNORMAL HIGH (ref 0.50–1.10)
GFR calc Af Amer: 55 mL/min — ABNORMAL LOW (ref >90)
GFR calc non Af Amer: 48 mL/min — ABNORMAL LOW (ref >90)
Glucose, Bld: 104 mg/dL — ABNORMAL HIGH (ref 70–99)
Potassium: 4.2 mEq/L (ref 3.7–5.3)
Sodium: 141 mEq/L (ref 137–147)

## 2014-10-31 LAB — CBC
HCT: 26.4 % — ABNORMAL LOW (ref 36.0–46.0)
Hemoglobin: 8.6 g/dL — ABNORMAL LOW (ref 12.0–15.0)
MCH: 27.6 pg (ref 26.0–34.0)
MCHC: 32.6 g/dL (ref 30.0–36.0)
MCV: 84.6 fL (ref 78.0–100.0)
Platelets: 171 10*3/uL (ref 150–400)
RBC: 3.12 MIL/uL — ABNORMAL LOW (ref 3.87–5.11)
RDW: 15.8 % — ABNORMAL HIGH (ref 11.5–15.5)
WBC: 15.3 10*3/uL — ABNORMAL HIGH (ref 4.0–10.5)

## 2014-10-31 LAB — GLUCOSE, CAPILLARY
GLUCOSE-CAPILLARY: 110 mg/dL — AB (ref 70–99)
GLUCOSE-CAPILLARY: 130 mg/dL — AB (ref 70–99)
GLUCOSE-CAPILLARY: 138 mg/dL — AB (ref 70–99)
GLUCOSE-CAPILLARY: 112 mg/dL — AB (ref 70–99)
Glucose-Capillary: 124 mg/dL — ABNORMAL HIGH (ref 70–99)

## 2014-10-31 MED ORDER — POTASSIUM CHLORIDE CRYS ER 10 MEQ PO TBCR
10.0000 meq | EXTENDED_RELEASE_TABLET | Freq: Every day | ORAL | Status: DC
  Filled 2014-10-31: qty 1

## 2014-10-31 MED ORDER — SODIUM CHLORIDE 0.9 % IJ SOLN
3.0000 mL | Freq: Two times a day (BID) | INTRAMUSCULAR | Status: DC
  Administered 2014-10-31 – 2014-11-03 (×6): 3 mL via INTRAVENOUS

## 2014-10-31 MED ORDER — INSULIN DETEMIR 100 UNIT/ML ~~LOC~~ SOLN
18.0000 [IU] | Freq: Every day | SUBCUTANEOUS | Status: DC
  Administered 2014-10-31: 18 [IU] via SUBCUTANEOUS
  Filled 2014-10-31: qty 0.18

## 2014-10-31 MED ORDER — SODIUM CHLORIDE 0.9 % IV SOLN: 250.0000 mL | INTRAVENOUS | Status: DC | PRN

## 2014-10-31 MED ORDER — MAGNESIUM HYDROXIDE 400 MG/5ML PO SUSP
30.0000 mL | Freq: Every day | ORAL | Status: DC | PRN
Start: 2014-10-31 — End: 2014-11-04

## 2014-10-31 MED ORDER — INSULIN ASPART 100 UNIT/ML ~~LOC~~ SOLN
0.0000 [IU] | Freq: Three times a day (TID) | SUBCUTANEOUS | Status: DC
  Administered 2014-11-02 – 2014-11-04 (×4): 2 [IU] via SUBCUTANEOUS

## 2014-10-31 MED ORDER — FUROSEMIDE 10 MG/ML IJ SOLN
40.0000 mg | Freq: Two times a day (BID) | INTRAMUSCULAR | Status: DC
  Administered 2014-10-31 (×2): 40 mg via INTRAVENOUS
  Filled 2014-10-31 (×2): qty 4

## 2014-10-31 MED ORDER — SODIUM CHLORIDE 0.9 % IJ SOLN
3.0000 mL | INTRAMUSCULAR | Status: DC | PRN
  Administered 2014-11-02: 3 mL via INTRAVENOUS
  Filled 2014-10-31: qty 3

## 2014-10-31 MED ORDER — FUROSEMIDE 40 MG PO TABS: 40.0000 mg | ORAL_TABLET | Freq: Every day | ORAL | Status: DC

## 2014-10-31 MED ORDER — MOVING RIGHT ALONG BOOK
Freq: Once | Status: AC
  Administered 2014-10-31: 19:00:00
  Filled 2014-10-31: qty 1

## 2014-10-31 MED ORDER — FUROSEMIDE 10 MG/ML IJ SOLN
40.0000 mg | Freq: Two times a day (BID) | INTRAMUSCULAR | Status: DC
  Filled 2014-10-31: qty 4

## 2014-10-31 MED ORDER — AMIODARONE HCL 200 MG PO TABS
200.0000 mg | ORAL_TABLET | Freq: Every day | ORAL | Status: DC
  Administered 2014-10-31 – 2014-11-04 (×5): 200 mg via ORAL
  Filled 2014-10-31 (×5): qty 1

## 2014-10-31 NOTE — Progress Notes (Signed)
     SUBJECTIVE: No SOB. No complaints except that her chest wall is sore.   BP 127/50 mmHg  Pulse 66  Temp(Src) 97.9 F (36.6 C) (Oral)  Resp 23  Ht 5\' 3"  (1.6 m)  Wt 226 lb 3.1 oz (102.6 kg)  BMI 40.08 kg/m2  SpO2 95%  Intake/Output Summary (Last 24 hours) at 10/31/14 4481 Last data filed at 10/31/14 0600  Gross per 24 hour  Intake 1330.46 ml  Output   2590 ml  Net -1259.54 ml    PHYSICAL EXAM General: Well developed, well nourished, in no acute distress. Alert and oriented x 3.  Psych:  Good affect, responds appropriately Neck: No JVD. No masses noted.  Lungs: Clear bilaterally with no wheezes or rhonci noted.  Heart: RRR with no murmurs noted. Abdomen: Bowel sounds are present. Soft, non-tender.  Extremities: No lower extremity edema.   LABS: Basic Metabolic Panel:  Recent Labs  10/30/14 0340 10/30/14 1600 10/30/14 1610 10/31/14 0451  NA 139  --  139 141  K 4.3  --  3.9 4.2  CL 104  --  102 103  CO2 20  --   --  23  GLUCOSE 109*  --  138* 104*  BUN 13  --  12 15  CREATININE 1.12* 1.24* 1.30* 1.21*  CALCIUM 8.2*  --   --  8.4  MG 2.2 2.0  --   --    CBC:  Recent Labs  10/30/14 1600 10/30/14 1610 10/31/14 0451  WBC 14.0*  --  15.3*  HGB 8.8* 9.5* 8.6*  HCT 27.3* 28.0* 26.4*  MCV 84.3  --  84.6  PLT 156  --  171    Current Meds: . acetaminophen  1,000 mg Oral 4 times per day   Or  . acetaminophen (TYLENOL) oral liquid 160 mg/5 mL  1,000 mg Per Tube 4 times per day  . amiodarone  200 mg Oral BID  . antiseptic oral rinse  7 mL Mouth Rinse QID  . aspirin EC  325 mg Oral Daily   Or  . aspirin  324 mg Per Tube Daily  . atorvastatin  80 mg Oral q1800  . bisacodyl  10 mg Oral Daily   Or  . bisacodyl  10 mg Rectal Daily  . chlorhexidine  15 mL Mouth Rinse BID  . docusate sodium  200 mg Oral Daily  . enoxaparin (LOVENOX) injection  30 mg Subcutaneous Q24H  . insulin aspart  0-24 Units Subcutaneous 6 times per day  . insulin detemir  14 Units  Subcutaneous BID  . insulin regular  0-10 Units Intravenous TID WC  . metoprolol tartrate  12.5 mg Oral BID   Or  . metoprolol tartrate  12.5 mg Per Tube BID  . mupirocin ointment  1 application Nasal BID  . pantoprazole  40 mg Oral Daily  . sodium chloride  3 mL Intravenous Q12H     ASSESSMENT AND PLAN:  1. CAD/Unstable angina:  POD#2 CABG. Stable. Progressing well. Sitting up in chair this am.  Continue ASA, statin, beta blocker  2. HTN: BP controlled. Continue current meds.   3. HLD: Continue statin  Her long term cardiology follow up will be in our [facility] office.   MCALHANY,CHRISTOPHER  11/11/20157:09 AM

## 2014-10-31 NOTE — Progress Notes (Signed)
2 Days Post-Op Procedure(s) (LRB): CORONARY ARTERY BYPASS GRAFTING (CABG) TIMES TWO USING LEFT INTERNAL MAMMARY ARTERY AND RIGHT LEG GREATER SAPHENOUS VEIN HARVESTED ENDOSCOPICALLY. (N/A) INTRAOPERATIVE TRANSESOPHAGEAL ECHOCARDIOGRAM (N/A) Subjective: OOB to chair CXR with LLL atelectasis O2 sats good periop afib now resolved with po  amiodarone  Objective: Vital signs in last 24 hours: Temp:  [97.7 F (36.5 C)-98.4 F (36.9 C)] 98 F (36.7 C) (11/11 0752) Pulse Rate:  [57-68] 62 (11/11 0700) Cardiac Rhythm:  [-] Normal sinus rhythm (11/10 2000) Resp:  [10-27] 18 (11/11 0700) BP: (82-131)/(33-108) 107/54 mmHg (11/11 0700) SpO2:  [92 %-100 %] 95 % (11/11 0700) Arterial Line BP: (98-135)/(35-63) 123/45 mmHg (11/10 1400) Weight:  [226 lb 3.1 oz (102.6 kg)] 226 lb 3.1 oz (102.6 kg) (11/11 0600)  Hemodynamic parameters for last 24 hours: PAP: (33-44)/(15-30) 44/30 mmHg  Intake/Output from previous day: 11/10 0701 - 11/11 0700 In: 1330.5 [I.V.:830.5; IV Piggyback:500] Out: 2590 [Urine:2560; Emesis/NG output:30] Intake/Output this shift:    Alert and comfortable Decreased BS L base  Lab Results:  Recent Labs  10/30/14 1600 10/30/14 1610 10/31/14 0451  WBC 14.0*  --  15.3*  HGB 8.8* 9.5* 8.6*  HCT 27.3* 28.0* 26.4*  PLT 156  --  171   BMET:  Recent Labs  10/30/14 0340  10/30/14 1610 10/31/14 0451  NA 139  --  139 141  K 4.3  --  3.9 4.2  CL 104  --  102 103  CO2 20  --   --  23  GLUCOSE 109*  --  138* 104*  BUN 13  --  12 15  CREATININE 1.12*  < > 1.30* 1.21*  CALCIUM 8.2*  --   --  8.4  < > = values in this interval not displayed.  PT/INR:  Recent Labs  10/29/14 1325  LABPROT 17.9*  INR 1.46   ABG    Component Value Date/Time   PHART 7.365 10/29/2014 2020   HCO3 22.2 10/29/2014 2020   TCO2 21 10/30/2014 1610   ACIDBASEDEF 3.0* 10/29/2014 2020   O2SAT 96.0 10/29/2014 2020   CBG (last 3)   Recent Labs  10/30/14 2046 10/30/14 2329  10/31/14 0337  GLUCAP 164* 101* 110*    Assessment/Plan: S/P Procedure(s) (LRB): CORONARY ARTERY BYPASS GRAFTING (CABG) TIMES TWO USING LEFT INTERNAL MAMMARY ARTERY AND RIGHT LEG GREATER SAPHENOUS VEIN HARVESTED ENDOSCOPICALLY. (N/A) INTRAOPERATIVE TRANSESOPHAGEAL ECHOCARDIOGRAM (N/A)  Cont Lantus and SSI for Diabetes control Plan for transfer to step-down: see transfer orders   LOS: 7 days    VAN TRIGT Green,Karina 10/31/2014

## 2014-10-31 NOTE — Progress Notes (Signed)
Pt transferred to 2w23.  Pt alert and orientedx4, no c/o pain, family with Rn at time of transfer with all personal belongings.  Report given to [facility].

## 2014-11-01 ENCOUNTER — Inpatient Hospital Stay (HOSPITAL_COMMUNITY): Payer: No Typology Code available for payment source

## 2014-11-01 LAB — CBC
HCT: 28.8 % — ABNORMAL LOW (ref 36.0–46.0)
Hemoglobin: 9.2 g/dL — ABNORMAL LOW (ref 12.0–15.0)
MCH: 27.1 pg (ref 26.0–34.0)
MCHC: 31.9 g/dL (ref 30.0–36.0)
MCV: 85 fL (ref 78.0–100.0)
Platelets: 199 10*3/uL (ref 150–400)
RBC: 3.39 MIL/uL — ABNORMAL LOW (ref 3.87–5.11)
RDW: 15.5 % (ref 11.5–15.5)
WBC: 16.2 10*3/uL — ABNORMAL HIGH (ref 4.0–10.5)

## 2014-11-01 LAB — GLUCOSE, CAPILLARY
GLUCOSE-CAPILLARY: 112 mg/dL — AB (ref 70–99)
GLUCOSE-CAPILLARY: 115 mg/dL — AB (ref 70–99)
GLUCOSE-CAPILLARY: 120 mg/dL — AB (ref 70–99)
GLUCOSE-CAPILLARY: 123 mg/dL — AB (ref 70–99)
Glucose-Capillary: 99 mg/dL (ref 70–99)

## 2014-11-01 LAB — BASIC METABOLIC PANEL
Anion gap: 16 — ABNORMAL HIGH (ref 5–15)
BUN: 22 mg/dL (ref 6–23)
CO2: 25 mEq/L (ref 19–32)
Calcium: 8.8 mg/dL (ref 8.4–10.5)
Chloride: 97 mEq/L (ref 96–112)
Creatinine, Ser: 1.34 mg/dL — ABNORMAL HIGH (ref 0.50–1.10)
GFR calc Af Amer: 49 mL/min — ABNORMAL LOW (ref >90)
GFR calc non Af Amer: 42 mL/min — ABNORMAL LOW (ref >90)
Glucose, Bld: 116 mg/dL — ABNORMAL HIGH (ref 70–99)
Potassium: 3.4 mEq/L — ABNORMAL LOW (ref 3.7–5.3)
Sodium: 138 mEq/L (ref 137–147)

## 2014-11-01 MED ORDER — FUROSEMIDE 10 MG/ML IJ SOLN: 40.0000 mg | Freq: Once | INTRAMUSCULAR | Status: DC

## 2014-11-01 MED ORDER — POTASSIUM CHLORIDE CRYS ER 20 MEQ PO TBCR
40.0000 meq | EXTENDED_RELEASE_TABLET | Freq: Once | ORAL | Status: AC
  Administered 2014-11-01: 40 meq via ORAL
  Filled 2014-11-01: qty 2

## 2014-11-01 MED ORDER — POTASSIUM CHLORIDE CRYS ER 20 MEQ PO TBCR
20.0000 meq | EXTENDED_RELEASE_TABLET | Freq: Every day | ORAL | Status: DC
  Administered 2014-11-01 – 2014-11-04 (×4): 20 meq via ORAL
  Filled 2014-11-01 (×4): qty 1

## 2014-11-01 MED ORDER — FUROSEMIDE 40 MG PO TABS
40.0000 mg | ORAL_TABLET | Freq: Every day | ORAL | Status: DC
  Administered 2014-11-01 – 2014-11-04 (×4): 40 mg via ORAL
  Filled 2014-11-01 (×4): qty 1

## 2014-11-01 MED ORDER — INSULIN DETEMIR 100 UNIT/ML ~~LOC~~ SOLN
14.0000 [IU] | Freq: Every day | SUBCUTANEOUS | Status: DC
  Administered 2014-11-01 – 2014-11-02 (×2): 14 [IU] via SUBCUTANEOUS
  Filled 2014-11-01 (×3): qty 0.14

## 2014-11-01 NOTE — Progress Notes (Signed)
CARDIAC REHAB PHASE I   PRE:  Rate/Rhythm: 73 SR    BP: sitting 123/54    SaO2: 69 RA in bed, EOB increased to 90 RA with pursed lip breathing  MODE:  Ambulation: 380 ft   POST:  Rate/Rhythm: 80 SR    BP: sitting 136/50     SaO2: 92 2L  Pt SaO2 very low in bed. Denied SOB. Slowly up to 90 RA with pursed lip breathing on EOB. (HR was accurate on pulse ox). Wore O2 for walk, tolerated well. SaO2 92 2L. Steady with RW. Moved rooms. Left pt on O2 in room and got extensions. [id number]   Josephina Shih [employer] [place] CES, ACSM 11/01/2014 11:15 AM

## 2014-11-01 NOTE — Plan of Care (Signed)
Problem: Phase II - Intermediate Post-Op Goal: Activity Progressed Outcome: Progressing

## 2014-11-01 NOTE — Progress Notes (Addendum)
      [address]       [place] [address]             [phone]        3 Days Post-Op Procedure(s) (LRB): CORONARY ARTERY BYPASS GRAFTING (CABG) TIMES TWO USING LEFT INTERNAL MAMMARY ARTERY AND RIGHT LEG GREATER SAPHENOUS VEIN HARVESTED ENDOSCOPICALLY. (N/A) INTRAOPERATIVE TRANSESOPHAGEAL ECHOCARDIOGRAM (N/A)  Subjective: Patient without specific complaints this am.  Objective: Vital signs in last 24 hours: Temp:  [97.6 F (36.4 C)-98.4 F (36.9 C)] 97.6 F (36.4 C) (11/12 0443) Pulse Rate:  [40-70] 63 (11/12 0443) Cardiac Rhythm:  [-] Normal sinus rhythm (11/11 2015) Resp:  [9-24] 18 (11/12 0443) BP: (100-138)/(46-64) 117/64 mmHg (11/12 0443) SpO2:  [93 %-98 %] 97 % (11/12 0443) Weight:  [219 lb 14.4 oz (99.746 kg)] 219 lb 14.4 oz (99.746 kg) (11/12 0231)  Pre op weight 99 kg Current Weight  11/01/14 219 lb 14.4 oz (99.746 kg)      Intake/Output from previous day: 11/11 0701 - 11/12 0700 In: 703 [P.O.:660; I.V.:43] Out: 3216 [Urine:3215; Stool:1]   Physical Exam:  Cardiovascular: RRR, no murmurs, gallops, or rubs. Pulmonary: Clear to auscultation bilaterally; no rales, wheezes, or rhonchi. Abdomen: Soft, non tender, bowel sounds present. Extremities: Mild bilateral lower extremity edema. Wounds: LE wounds are all clean and dry.  No erythema or signs of infection. Aquacel dressing in place on sternum. Will be removed later today.  Lab Results: CBC: Recent Labs  10/31/14 0451 11/01/14 0301  WBC 15.3* 16.2*  HGB 8.6* 9.2*  HCT 26.4* 28.8*  PLT 171 199   BMET:  Recent Labs  10/31/14 0451 11/01/14 0301  NA 141 138  K 4.2 3.4*  CL 103 97  CO2 23 25  GLUCOSE 104* 116*  BUN 15 22  CREATININE 1.21* 1.34*  CALCIUM 8.4 8.8    PT/INR:  Lab Results  Component Value Date   INR 1.46 10/29/2014   INR 1.12 10/25/2014   INR 1.9* 04/19/2008   ABG:  INR: Will add last result for INR, ABG once components are confirmed Will add last  4 CBG results once components are confirmed  Assessment/Plan:  1. CV - Previous a fib.SR in the 70's this am. On Amiodarone 200 daily and Lopressor 12.5 bid. 2.  Pulmonary - CXR appears to show no pneumothorax, small left pleural effusion, and bibasilar atelectasis. Encourage incentive spirometer and flutter valve. 3. Volume Overload - Lasix 40 IV bid ordered for today but will give oral Lasix 40 daily as creatinine increased. 4.  Acute blood loss anemia - H and H stable at 90.2 and 28.8 5. DM-CBGs 124/112/99. On Insulin. Pre op HGA1C 6.6. Will start Metformin as is newly diagnosed diabetic once creatinine decreases. 6. Supplement potassium 7. Creatinine slightly increased from 1.21 to 1.34. Not on ACE or Metformin. Re check in am 8. WBC slightly increased from 15.3 to 16.2. Likely combination of SIRS and atelectasis. Re check in am. 9. Remove EPW in am  Green,Karina MPA-C 11/01/2014,7:43 AM  Plan amiodarone low dose at discharge- no coumadin for now DC lantus after metformin gets started CXR reviewed Check urine culture--WBC up  patient examined and medical record reviewed,agree with above note. VAN TRIGT III,Tarri Guilfoil 11/01/2014

## 2014-11-02 LAB — GLUCOSE, CAPILLARY
GLUCOSE-CAPILLARY: 124 mg/dL — AB (ref 70–99)
GLUCOSE-CAPILLARY: 114 mg/dL — AB (ref 70–99)
Glucose-Capillary: 111 mg/dL — ABNORMAL HIGH (ref 70–99)
Glucose-Capillary: 107 mg/dL — ABNORMAL HIGH (ref 70–99)

## 2014-11-02 LAB — BASIC METABOLIC PANEL
Anion gap: 16 — ABNORMAL HIGH (ref 5–15)
BUN: 26 mg/dL — ABNORMAL HIGH (ref 6–23)
CALCIUM: 8.9 mg/dL (ref 8.4–10.5)
CO2: 28 meq/L (ref 19–32)
Chloride: 96 mEq/L (ref 96–112)
Creatinine, Ser: 1.27 mg/dL — ABNORMAL HIGH (ref 0.50–1.10)
GFR calc Af Amer: 52 mL/min — ABNORMAL LOW (ref >90)
GFR calc non Af Amer: 45 mL/min — ABNORMAL LOW (ref >90)
GLUCOSE: 103 mg/dL — AB (ref 70–99)
POTASSIUM: 3.9 meq/L (ref 3.7–5.3)
Sodium: 140 mEq/L (ref 137–147)

## 2014-11-02 LAB — CBC
HEMATOCRIT: 27.9 % — AB (ref 36.0–46.0)
Hemoglobin: 8.8 g/dL — ABNORMAL LOW (ref 12.0–15.0)
MCH: 26.7 pg (ref 26.0–34.0)
MCHC: 31.5 g/dL (ref 30.0–36.0)
MCV: 84.8 fL (ref 78.0–100.0)
Platelets: 226 10*3/uL (ref 150–400)
RBC: 3.29 MIL/uL — AB (ref 3.87–5.11)
RDW: 15.5 % (ref 11.5–15.5)
WBC: 12.6 10*3/uL — AB (ref 4.0–10.5)

## 2014-11-02 MED ORDER — FE FUMARATE-B12-VIT C-FA-IFC PO CAPS
1.0000 | ORAL_CAPSULE | Freq: Every day | ORAL | Status: DC
  Administered 2014-11-02 – 2014-11-04 (×3): 1 via ORAL
  Filled 2014-11-02 (×3): qty 1

## 2014-11-02 MED ORDER — AMLODIPINE BESYLATE 5 MG PO TABS
5.0000 mg | ORAL_TABLET | Freq: Every day | ORAL | Status: DC
  Administered 2014-11-02 – 2014-11-04 (×3): 5 mg via ORAL
  Filled 2014-11-02 (×3): qty 1

## 2014-11-02 MED ORDER — ENOXAPARIN SODIUM 30 MG/0.3ML ~~LOC~~ SOLN
30.0000 mg | SUBCUTANEOUS | Status: DC
  Administered 2014-11-03: 30 mg via SUBCUTANEOUS
  Filled 2014-11-02 (×3): qty 0.3

## 2014-11-02 NOTE — Progress Notes (Addendum)
      [address]       [place] [address]             [phone]        4 Days Post-Op Procedure(s) (LRB): CORONARY ARTERY BYPASS GRAFTING (CABG) TIMES TWO USING LEFT INTERNAL MAMMARY ARTERY AND RIGHT LEG GREATER SAPHENOUS VEIN HARVESTED ENDOSCOPICALLY. (N/A) INTRAOPERATIVE TRANSESOPHAGEAL ECHOCARDIOGRAM (N/A)  Subjective: Patient without complaints  Objective: Vital signs in last 24 hours: Temp:  [97.8 F (36.6 C)-98.5 F (36.9 C)] 97.8 F (36.6 C) (11/13 0631) Pulse Rate:  [61-73] 73 (11/13 0631) Cardiac Rhythm:  [-] Normal sinus rhythm (11/12 2022) Resp:  [18] 18 (11/13 0631) BP: (112-140)/(57-68) 140/68 mmHg (11/13 0631) SpO2:  [95 %-100 %] 95 % (11/13 0631) Weight:  [216 lb 4.8 oz (98.113 kg)] 216 lb 4.8 oz (98.113 kg) (11/13 0631)  Pre op weight 99 kg Current Weight  11/02/14 216 lb 4.8 oz (98.113 kg)      Intake/Output from previous day: 11/12 0701 - 11/13 0700 In: 480 [P.O.:480] Out: -    Physical Exam:  Cardiovascular: RRR Pulmonary: Clear to auscultation bilaterally; no rales, wheezes, or rhonchi. Abdomen: Soft, non tender, bowel sounds present. Extremities: Mild bilateral lower extremity edema. Wounds: LE wounds are all clean and dry.  No erythema or signs of infection. Sternal wound is clean and dry.  Lab Results: CBC:  Recent Labs  11/01/14 0301 11/02/14 0353  WBC 16.2* 12.6*  HGB 9.2* 8.8*  HCT 28.8* 27.9*  PLT 199 226   BMET:   Recent Labs  11/01/14 0301 11/02/14 0353  NA 138 140  K 3.4* 3.9  CL 97 96  CO2 25 28  GLUCOSE 116* 103*  BUN 22 26*  CREATININE 1.34* 1.27*  CALCIUM 8.8 8.9    PT/INR:  Lab Results  Component Value Date   INR 1.46 10/29/2014   INR 1.12 10/25/2014   INR 1.9* 04/19/2008   ABG:  INR: Will add last result for INR, ABG once components are confirmed Will add last 4 CBG results once components are confirmed  Assessment/Plan:  1. CV - Previous a fib.SR in the 70's this am. On  Amiodarone 200 daily and Lopressor 12.5 bid. Hypertensive so will start Norvasc. No ACE/ARB as creatinine still elevated. 2.  Pulmonary - On 2 liters of oxygen via [place]-wean as tolerates. Encourage incentive spirometer and flutter valve. 3. Volume Overload - Lasix 40 IV bid ordered for today but will give oral Lasix 40 daily as creatinine increased. 4.  Acute blood loss anemia - H and H stable at 8.8 and 27.9. Start Trinsicon. 5. DM-CBGs 120/115/124. On Insulin. Pre op HGA1C 6.6. Will start Metformin in am as is newly diagnosed diabetic. 6. Creatinine slightly decreased from 1.34 to 1.27. Not on ACE. 7. WBC decreased from 16.2 to 12.6. Remains afebrile.Likely combination of SIRS and atelectasis. UC results pending. 8. Remove EPW  9. Possible discharge in 1-2 days  Era Parr MPA-C 11/02/2014,7:27 AM

## 2014-11-02 NOTE — Discharge Summary (Signed)
Physician Discharge Summary       [address]/       [place] [address]           [phone]    Patient ID: Karina Green MRN: [id number] DOB/AGE: 08/08/1956 58 y.o.  Admit date: 10/24/2014 Discharge date: 11/04/2014  Admission Diagnoses: 1. Multivessel CAD (left main disease included) 2. History of hypertension 3. History of hypercholesterolemia 4. Newly diagnosed DM Type 2  Discharge Diagnoses:  1. Multivessel CAD (left main disease included) 2. History of hypertension 3. History of hypercholesterolemia 4. Newly diagnosed DM Type 2 5. ABL anemia 6. Atrial fibrillation 7. UTI (Gram negative rods)   Procedure (s):  1.CORONARY ANGIOGRAPHY done by Dr. Claiborne Billings on 10/26/2014 The left main coronary artery was a large-caliber vessel which bifurcated into the LAD and left circumflex coronary artery. There was a 70% distal left main stenosis prior to its bifurcation with a suggestion of a focal napkin ring like appearance. This did not improve following IC nitroglycerin administration.  The LAD was angiographically normal and gave rise to 2 major diagonal vessels and several septal perforating arteries. The vessel extended to the LV apex.   The left circumflex coronary artery was angiographically normal and gave rise to one major bifurcating obtuse marginal branch.   The RCA was angiographically normal it gave rise to a PDA and PLA vessel.   Left ventriculography revealed normal global LV contractility without focal segmental wall motion abnormalities. There was no evidence for mitral regurgitation. Ejection fraction was 60%.  Total contrast used: 85 cc Omnipaque  IMPRESSION:  Significant coronary obstructive disease with 70% distal left main stenosis.  Normal LV function.  2. Coronary artery bypass grafting x2 (left internal mammary artery to left anterior descending artery, saphenous vein graft to obtuse marginal). 3.Endoscopic harvest of right leg  greater saphenous vein, exposure of left leg greater saphenous vein, which was inadequate for conduit by Dr. Prescott Gum on 10/29/2014.  History of Presenting Illness: This is a very pleasant 58 year old Caucasian female nonsmoker admitted with unstable angina for 48 hours duration with increasing intensity and frequency. Cardiac enzymes on presentation to the emergency department were negative. She has a strong family history of coronary disease as several members have had CABG. She has a history of hypertension, hyperlipidemia, and obesity. Echocardiogram demonstrates good LV function with mild aortic stenosis. She underwent cardiac catheterization via the right radial artery on 10/26/2014. Coronary angiogram demonstrates a 75% left main stenosis. Her cardiologist recommended CABG and Dr. Prescott Gum was consulted.At the time of being evaluated,  she has been stable and without chest pain. Dr. Prescott Gum discussed the need for coronary artery bypass grafting surgery (CABG). Potential risks, complications, and benefits were discussed with the patient and she agreed to proceed with surgery. Pre operative carotid duplex US showed no significant internal carotid stenosis bilaterally. She underwent a CABG x 2 on 10/29/2014.  Brief Hospital Course: The patient was extubated the evening of surgery without difficulty. She remained afebrile and hemodynamically stable. Gordy Councilman, a line, chest tubes, and foley were removed early in the post operative course. Lopressor was started and titrated accordingly. She was on an Amiodarone drip for a fib. She converted to sinus rhythm and remained. She was then started on oral Amiodarone. She was volume over loaded and diuresed. She did have ABL anemia. Her last H and H was 8.8 and 27.9. She did not require a post op transfusion. She was started on Trinsicon. She  was weaned off the insulin drip. The patient's HGA1C pre op was 6.6. She was continued on Insulin for a few days post op  as her creatinine was elevated. Her last creatinine was 1.32. She was started on Metformin and the Insulin was stopped. She had good glucose control. She will need close medical follow up with her primary care physician for further diabetes management. The patient was felt surgically stable for transfer from the ICU to PCTU for further convalescence on 11/01/2014.She did develop leukocytosis and her WBC went up to 16.2. She had no wound infection. UC was drawn and showed gram negative rods >100,000. She was started on Cipro. We will follow up on the sensitivities. Her last WBC was down to 12,600. She continues to progress with cardiac rehab. She initially required 2 liters of oxygen via [place]. She was weaned to room air.She has been tolerating a diet and has had a bowel movement. Epicardial pacing wires and chest tube sutures will be removed prior to discharge. As discussed with Dr. Roxy Manns, she is felt surgically stable for discharge today.  Latest Vital Signs: Blood pressure 118/62, pulse 67, temperature 97.7 F (36.5 C), temperature source Oral, resp. rate 18, height 5\' 3"  (1.6 m), weight 213 lb 6.5 oz (96.8 kg), SpO2 94 %.  Physical Exam: Cardiovascular: RRR Pulmonary: Clear to auscultation bilaterally; no rales, wheezes, or rhonchi. Abdomen: Soft, non tender, bowel sounds present. Extremities: Mild bilateral lower extremity edema. Wounds: LE wounds are all clean and dry. No erythema or signs of infection. Sternal wound is clean and dry.  Discharge Condition:Stable  Recent laboratory studies:  Lab Results  Component Value Date   WBC 12.6* 11/02/2014   HGB 8.8* 11/02/2014   HCT 27.9* 11/02/2014   MCV 84.8 11/02/2014   PLT 226 11/02/2014   Lab Results  Component Value Date   NA 136* 11/04/2014   K 3.2* 11/04/2014   CL 92* 11/04/2014   CO2 29 11/04/2014   CREATININE 1.32* 11/04/2014   GLUCOSE 120* 11/04/2014     Diagnostic Studies: Dg Chest 2 View  11/01/2014   CLINICAL DATA:  Sore  chest post CABG  EXAM: CHEST  2 VIEW  COMPARISON:  10/31/2014  FINDINGS: Enlargement of cardiac silhouette post CABG.  Mediastinal contours normal.  Slight pulmonary vascular congestion.  Bibasilar atelectasis and pleural effusions greater on LEFT.  Atelectasis LEFT mid lung.  Epicardial pacing wires noted.  Remaining lungs clear.  No pneumothorax.  BILATERAL AC joint degenerative changes.  IMPRESSION: Enlargement of cardiac silhouette with slight pulmonary vascular congestion post CABG.  Persistent bibasilar effusions and atelectasis greater on LEFT.   Electronically Signed   By: Lavonia Dana M.D.   On: 11/01/2014 07:56      Discharge Instructions    Amb Referral to Cardiac Rehabilitation    Complete by:  As directed            Discharge Medications:   Medication List    STOP taking these medications        acetaminophen-codeine 300-30 MG per tablet  Commonly known as:  TYLENOL #3     hydrochlorothiazide 25 MG tablet  Commonly known as:  HYDRODIURIL     HYDROcodone-acetaminophen 5-325 MG per tablet  Commonly known as:  NORCO     ibuprofen 200 MG tablet  Commonly known as:  ADVIL,MOTRIN     lisinopril 20 MG tablet  Commonly known as:  PRINIVIL,ZESTRIL     simvastatin 20 MG tablet  Commonly known as:  ZOCOR     sulfamethoxazole-trimethoprim 800-160 MG per tablet  Commonly known as:  SEPTRA DS      TAKE these medications        ALPRAZolam 0.5 MG tablet  Commonly known as:  XANAX  Take 0.5 mg by mouth daily as needed. Panic attacks/anxiety.     amiodarone 200 MG tablet  Commonly known as:  PACERONE  Take 1 tablet (200 mg total) by mouth daily.     aspirin 325 MG EC tablet  Take 1 tablet (325 mg total) by mouth daily.     atorvastatin 80 MG tablet  Commonly known as:  LIPITOR  Take 1 tablet (80 mg total) by mouth daily at 6 PM.     ciprofloxacin 500 MG tablet  Commonly known as:  CIPRO  Take 1 tablet (500 mg total) by mouth 2 (two) times daily. For one week then  stop.     ferrous [id number] C-folic acid capsule  Commonly known as:  TRINSICON / FOLTRIN  Take 1 capsule by mouth daily. For one month then stop.     indomethacin 25 MG capsule  Commonly known as:  INDOCIN  Take 1 capsule (25 mg total) by mouth 3 (three) times daily as needed.     metFORMIN 500 MG tablet  Commonly known as:  GLUCOPHAGE  Take 1 tablet (500 mg total) by mouth 2 (two) times daily with a meal.     metoprolol tartrate 25 MG tablet  Commonly known as:  LOPRESSOR  Take 0.5 tablets (12.5 mg total) by mouth 2 (two) times daily.     oxyCODONE 5 MG immediate release tablet  Commonly known as:  Oxy IR/ROXICODONE  Take 1 tablet (5 mg total) by mouth every 4 (four) hours as needed for severe pain.        The patient has been discharged on:   1.Beta Blocker:  Yes [ X  ]                              No   [   ]                              If No, reason:  2.Ace Inhibitor/ARB: Yes [   ]                                     No  [ X   ]                                     If No, reason:Elevated creatinine  3.Statin:   Yes [ X ]                  No  [   ]                  If No, reason:  4.Ecasa:  Yes  [ X  ]                  No   [   ]                  If No, reason:  Follow Up Appointments: Follow-up Information  Follow up with Jory Sims, NP On 11/20/2014.   Specialty:  Nurse Practitioner   Why:  Appointment time is at 2:30 pm   Contact information:   [place] [address] [phone]       Follow up with VAN Wilber Oliphant, MD On 12/05/2014.   Specialty:  Cardiothoracic Surgery   Why:  PA/LAt CXR to be taken at ([facility] which is in the same building as Dr. Lucianne Lei Trigt's office) on 12/05/2014 at 9:00 AM ;Appointment time with Dr. Prescott Gum is at 10:00 AM   Contact information:   [address] [place] [address] [phone]       Follow up with Leonides Grills, MD.   Specialty:  Family Medicine    Why:  Call regarding further diabetes management and surveillance of HGA1C 6.6   Contact information:   [place] [address] [phone]       Signed: Crimson Dubberly MPA-C 11/04/2014, 10:36 AM

## 2014-11-02 NOTE — Progress Notes (Signed)
[id number] Education completed with pt who voiced understanding. Encouraged IS and flutter valve. Discussed CRP 2 and pt gave permission to refer to [place] pt heart healthy and diabetic diets and discussed carb counting since HGA1C at 6.6 .Graylon Good RN BSN 11/02/2014 3:06 PM

## 2014-11-02 NOTE — Progress Notes (Signed)
CARDIAC REHAB PHASE I   PRE:  Rate/Rhythm: 70 SR    BP: sitting 140/70    SaO2: 98 2L, 83 RA after BR  MODE:  Ambulation: 530 ft   POST:  Rate/Rhythm: 98 SR    BP: sitting 130/74     SaO2: 90 2L  Tried pt on RA going to BR. Initially 92 but SAO2 slowly dropped to 83 RA standing still at sink. Reapplied 2L and up to 97. Ambulated on 2L, continues to need O2. Rest x2. To recliner after walk. No c/o. Will walk more later. Practicing IS and flutter valve. Will f/u tomorrow to check SaO2 again and ed. [id number]   Josephina Shih [place] CES, ACSM 11/02/2014 11:27 AM

## 2014-11-02 NOTE — Progress Notes (Signed)
EPW discontinued per MD orders and per protocol. Patient tolerated procedure well. Wires intact. Patient instructed to remain in bed x 1 hour and call RN with concerns. RN will continue to monitor. Alfredo Bach RN BSN 11/02/2014 9:28 AM

## 2014-11-02 NOTE — Discharge Instructions (Signed)
Activity: 1.May walk up steps °               2.No lifting more than ten pounds for four weeks.  °               3.No driving for four weeks. °               4.Stop any activity that causes chest pain, shortness of breath, dizziness, sweating or excessive weakness. °               5.Avoid straining. °               6.Continue with your breathing exercises daily. ° °Diet: Diabetic diet and Low fat, Low salt diet ° °Wound Care: May shower.  Clean wounds with mild soap and water daily. Contact the office at [phone] if any problems arise. ° °Coronary Artery Bypass Grafting, Care After °Refer to this sheet in the next few weeks. These instructions provide you with information on caring for yourself after your procedure. Your health care provider may also give you more specific instructions. Your treatment has been planned according to current medical practices, but problems sometimes occur. Call your health care provider if you have any problems or questions after your procedure. °WHAT TO EXPECT AFTER THE PROCEDURE °Recovery from surgery will be different for everyone. Some people feel well after 3 or 4 weeks, while for others it takes longer. After your procedure, it is typical to have the following: °· Nausea and a lack of appetite.   °· Constipation. °· Weakness and fatigue.   °· Depression or irritability.   °· Pain or discomfort at your incision site. °HOME CARE INSTRUCTIONS °· Take medicines only as directed by your health care provider. Do not stop taking medicines or start any new medicines without first checking with your health care provider. °· Take your pulse as directed by your health care provider. °· Perform deep breathing as directed by your health care provider. If you were given a device called an incentive spirometer, use it to practice deep breathing several times a day. Support your chest with a pillow or your arms when you take deep breaths or cough. °· Keep incision areas clean, dry, and  protected. Remove or change any bandages (dressings) only as directed by your health care provider. You may have skin adhesive strips over the incision areas. Do not take the strips off. They will fall off on their own. °· Check incision areas daily for any swelling, redness, or drainage. °· If incisions were made in your legs, do the following: °¨ Avoid crossing your legs.   °¨ Avoid sitting for long periods of time. Change positions every 30 minutes.   °¨ Elevate your legs when you are sitting. °· Wear compression stockings as directed by your health care provider. These stockings help keep blood clots from forming in your legs. °· Take showers once your health care provider approves. Until then, only take sponge baths. Pat incisions dry. Do not rub incisions with a washcloth or towel. Do not take baths, swim, or use a hot tub until your health care provider approves. °· Eat foods that are high in fiber, such as raw fruits and vegetables, whole grains, beans, and nuts. Meats should be lean cut. Avoid canned, processed, and fried foods. °· Drink enough fluid to keep your urine clear or pale yellow. °· Weigh yourself every day. This helps identify if you are retaining fluid that may make your heart and lungs   work harder. °· Rest and limit activity as directed by your health care provider. You may be instructed to: °¨ Stop any activity at once if you have chest pain, shortness of breath, irregular heartbeats, or dizziness. Get help right away if you have any of these symptoms. °¨ Move around frequently for short periods or take short walks as directed by your health care provider. Increase your activities gradually. You may need physical therapy or cardiac rehabilitation to help strengthen your muscles and build your endurance. °¨ Avoid lifting, pushing, or pulling anything heavier than 10 lb (4.5 kg) for at least 6 weeks after surgery. °· Do not drive until your health care provider approves.  °· Ask your health  care provider when you may return to work. °· Ask your health care provider when you may resume sexual activity. °· Keep all follow-up visits as directed by your health care provider. This is important. °SEEK MEDICAL CARE IF: °· You have swelling, redness, increasing pain, or drainage at the site of an incision. °· You have a fever. °· You have swelling in your ankles or legs. °· You have pain in your legs.   °· You gain 2 or more pounds (0.9 kg) a day. °· You are nauseous or vomit. °· You have diarrhea.  °SEEK IMMEDIATE MEDICAL CARE IF: °· You have chest pain that goes to your jaw or arms. °· You have shortness of breath.   °· You have a fast or irregular heartbeat.   °· You notice a "clicking" in your breastbone (sternum) when you move.   °· You have numbness or weakness in your arms or legs. °· You feel dizzy or light-headed.   °MAKE SURE YOU: °· Understand these instructions. °· Will watch your condition. °· Will get help right away if you are not doing well or get worse. °Document Released: 06/26/2005 Document Revised: 04/23/2014 Document Reviewed: 05/16/2013 °Exit[employer]® Patient Information ©2015 [employer]. This information is not intended to replace advice given to you by your health care provider. Make sure you discuss any questions you have with your health care provider. ° ° ° °

## 2014-11-03 LAB — GLUCOSE, CAPILLARY
GLUCOSE-CAPILLARY: 104 mg/dL — AB (ref 70–99)
Glucose-Capillary: 114 mg/dL — ABNORMAL HIGH (ref 70–99)
Glucose-Capillary: 118 mg/dL — ABNORMAL HIGH (ref 70–99)

## 2014-11-03 MED ORDER — METFORMIN HCL 500 MG PO TABS
500.0000 mg | ORAL_TABLET | Freq: Two times a day (BID) | ORAL | Status: DC
  Administered 2014-11-03 – 2014-11-04 (×2): 500 mg via ORAL
  Filled 2014-11-03 (×5): qty 1

## 2014-11-03 NOTE — Progress Notes (Addendum)
      [address]       [place] [address]             [phone]        5 Days Post-Op Procedure(s) (LRB): CORONARY ARTERY BYPASS GRAFTING (CABG) TIMES TWO USING LEFT INTERNAL MAMMARY ARTERY AND RIGHT LEG GREATER SAPHENOUS VEIN HARVESTED ENDOSCOPICALLY. (N/A) INTRAOPERATIVE TRANSESOPHAGEAL ECHOCARDIOGRAM (N/A)  Subjective: Patient without complaints  Objective: Vital signs in last 24 hours: Temp:  [98.2 F (36.8 C)-98.7 F (37.1 C)] 98.2 F (36.8 C) (11/14 0500) Pulse Rate:  [72-92] 73 (11/14 0500) Cardiac Rhythm:  [-] Normal sinus rhythm (11/13 2153) Resp:  [18-20] 20 (11/14 0500) BP: (110-150)/(50-72) 128/50 mmHg (11/14 0500) SpO2:  [96 %-100 %] 96 % (11/14 0500) Weight:  [214 lb 3.2 oz (97.16 kg)] 214 lb 3.2 oz (97.16 kg) (11/14 0500)  Pre op weight 99 kg Current Weight  11/03/14 214 lb 3.2 oz (97.16 kg)      Intake/Output from previous day: 11/13 0701 - 11/14 0700 In: 600 [P.O.:600] Out: -    Physical Exam:  Cardiovascular: RRR Pulmonary: Clear to auscultation bilaterally; no rales, wheezes, or rhonchi. Abdomen: Soft, non tender, bowel sounds present. Extremities: Mild bilateral lower extremity edema. Wounds: LE wounds are all clean and dry.  No erythema or signs of infection. Sternal wound is clean and dry.  Lab Results: CBC:  Recent Labs  11/01/14 0301 11/02/14 0353  WBC 16.2* 12.6*  HGB 9.2* 8.8*  HCT 28.8* 27.9*  PLT 199 226   BMET:   Recent Labs  11/01/14 0301 11/02/14 0353  NA 138 140  K 3.4* 3.9  CL 97 96  CO2 25 28  GLUCOSE 116* 103*  BUN 22 26*  CREATININE 1.34* 1.27*  CALCIUM 8.8 8.9    PT/INR:  Lab Results  Component Value Date   INR 1.46 10/29/2014   INR 1.12 10/25/2014   INR 1.9* 04/19/2008   ABG:  INR: Will add last result for INR, ABG once components are confirmed Will add last 4 CBG results once components are confirmed  Assessment/Plan:  1. CV - Previous a fib.SR in the 80's this am. On  Amiodarone 200 daily, Norvasc 5 daily, and Lopressor 12.5 bid.  No ACE/ARB as creatinine still elevated. 2.  Pulmonary - On 2 liters of oxygen via [place]-wean as tolerates. Encourage incentive spirometer and flutter valve. 3. Volume Overload - On Lasix 40 daily 4.  Acute blood loss anemia - H and H stable at 8.8 and 27.9. ContinueTrinsicon. 5. DM-CBGs 107/114/104. On Insulin. Pre op HGA1C 6.6. Will start Metformin today and stop Insulin as is newly diagnosed diabetic. 6. WBC decreased from 16.2 to 12.6. Remains afebrile.Likely combination of SIRS and atelectasis. UC results pending. 7. Recheck creatinine in am as being started on Metformin 8. Hope to discharge in am  ZIMMERMAN,DONIELLE MPA-C 11/03/2014,8:55 AM   I have seen and examined the patient and agree with the assessment and plan as outlined.  Itzae Mccurdy H 11/03/2014 12:35 PM

## 2014-11-03 NOTE — Progress Notes (Signed)
CARDIAC REHAB PHASE I   PRE:  Rate/Rhythm: 85 NSR  BP:  Sitting: 128/66      SaO2: 95 RA at rest  MODE:  Ambulation: 540 ft   POST:  Rate/Rhythm: 97 NSR  BP:  Sitting: 132/70     SaO2: 93 RA post walk sitting 1135-1200  Prior to walk with cardiac rehab, patient observed ambulating in room without walker. Steady gait observed.Patient ambulated in hallway with RW independently on Room Air. Steady gait noted. Patient denied complaints. Continuous sat monitoring done. Saturations remained between 95 and 97% during entire walk. Post ambulation patient returned to recliner with call bell in reach. Patient doing a great job not using her arms for movement.  Karina Green, BSN 11/03/2014 12:03 PM

## 2014-11-03 NOTE — Plan of Care (Signed)
Problem: Discharge Progression Outcomes Goal: No anginal pain Outcome: Completed/Met Date Met:  11/03/14 Goal: Hemodynamically stable Outcome: Completed/Met Date Met:  11/03/14 Goal: Discharge plan in place and appropriate Outcome: Completed/Met Date Met:  11/03/14 Goal: Tolerates diet Outcome: Completed/Met Date Met:  11/03/14 Goal: Activity appropriate for discharge plan Outcome: Completed/Met Date Met:  11/03/14

## 2014-11-03 NOTE — Progress Notes (Signed)
Pt was 96% after being on room air for an hour.

## 2014-11-04 LAB — BASIC METABOLIC PANEL
ANION GAP: 15 (ref 5–15)
BUN: 28 mg/dL — ABNORMAL HIGH (ref 6–23)
CO2: 29 mEq/L (ref 19–32)
Calcium: 9.1 mg/dL (ref 8.4–10.5)
Chloride: 92 mEq/L — ABNORMAL LOW (ref 96–112)
Creatinine, Ser: 1.32 mg/dL — ABNORMAL HIGH (ref 0.50–1.10)
GFR, EST AFRICAN AMERICAN: 50 mL/min — AB (ref >90)
GFR, EST NON AFRICAN AMERICAN: 43 mL/min — AB (ref >90)
Glucose, Bld: 120 mg/dL — ABNORMAL HIGH (ref 70–99)
POTASSIUM: 3.2 meq/L — AB (ref 3.7–5.3)
SODIUM: 136 meq/L — AB (ref 137–147)

## 2014-11-04 LAB — GLUCOSE, CAPILLARY
GLUCOSE-CAPILLARY: 138 mg/dL — AB (ref 70–99)
GLUCOSE-CAPILLARY: 145 mg/dL — AB (ref 70–99)
Glucose-Capillary: 129 mg/dL — ABNORMAL HIGH (ref 70–99)

## 2014-11-04 MED ORDER — FE FUMARATE-B12-VIT C-FA-IFC PO CAPS: 1.0000 | ORAL_CAPSULE | Freq: Every day | ORAL | Status: DC

## 2014-11-04 MED ORDER — SULFAMETHOXAZOLE-TRIMETHOPRIM 800-160 MG PO TABS: 1.0000 | ORAL_TABLET | Freq: Two times a day (BID) | ORAL | Status: DC

## 2014-11-04 MED ORDER — CIPROFLOXACIN HCL 500 MG PO TABS
500.0000 mg | ORAL_TABLET | Freq: Two times a day (BID) | ORAL | Status: DC
  Filled 2014-11-04 (×3): qty 1

## 2014-11-04 MED ORDER — SULFAMETHOXAZOLE-TRIMETHOPRIM 800-160 MG PO TABS
1.0000 | ORAL_TABLET | Freq: Two times a day (BID) | ORAL | Status: DC
  Administered 2014-11-04: 1 via ORAL
  Filled 2014-11-04 (×2): qty 1

## 2014-11-04 MED ORDER — METFORMIN HCL 500 MG PO TABS: 500.0000 mg | ORAL_TABLET | Freq: Two times a day (BID) | ORAL | Status: DC

## 2014-11-04 MED ORDER — METOPROLOL TARTRATE 25 MG PO TABS: 12.5000 mg | ORAL_TABLET | Freq: Two times a day (BID) | ORAL | Status: DC

## 2014-11-04 MED ORDER — AMIODARONE HCL 200 MG PO TABS: 200.0000 mg | ORAL_TABLET | Freq: Every day | ORAL | Status: DC

## 2014-11-04 MED ORDER — OXYCODONE HCL 5 MG PO TABS: 5.0000 mg | ORAL_TABLET | ORAL | Status: DC | PRN

## 2014-11-04 MED ORDER — CIPROFLOXACIN HCL 500 MG PO TABS: 500.0000 mg | ORAL_TABLET | Freq: Two times a day (BID) | ORAL | Status: DC

## 2014-11-04 MED ORDER — ASPIRIN 325 MG PO TBEC: 325.0000 mg | DELAYED_RELEASE_TABLET | Freq: Every day | ORAL | Status: DC

## 2014-11-04 MED ORDER — ATORVASTATIN CALCIUM 80 MG PO TABS: 80.0000 mg | ORAL_TABLET | Freq: Every day | ORAL | Status: DC

## 2014-11-04 NOTE — Progress Notes (Signed)
Pt d/c home with spouse.  Alert and oriented x4.  No c/o pain.  Chest tube sutures were removed per MD order from the left site, however wound appeared slightly open with no drainage.  PA was notified and steristrips and a dry gauze was applied to the site.  PA instructed nursing to leave right side sutures in and pt was instructed to call office to schedule an appt to have sutures removed.

## 2014-11-04 NOTE — Progress Notes (Addendum)
      [address]       [place] [address]             [phone]        6 Days Post-Op Procedure(s) (LRB): CORONARY ARTERY BYPASS GRAFTING (CABG) TIMES TWO USING LEFT INTERNAL MAMMARY ARTERY AND RIGHT LEG GREATER SAPHENOUS VEIN HARVESTED ENDOSCOPICALLY. (N/A) INTRAOPERATIVE TRANSESOPHAGEAL ECHOCARDIOGRAM (N/A)  Subjective: Patient without complaints and really wants to go home.  Objective: Vital signs in last 24 hours: Temp:  [97.7 F (36.5 C)-98.6 F (37 C)] 97.7 F (36.5 C) (11/15 0451) Pulse Rate:  [75-83] 77 (11/15 0451) Cardiac Rhythm:  [-] Normal sinus rhythm (11/14 2004) Resp:  [18] 18 (11/15 0451) BP: (118-133)/(61-66) 128/61 mmHg (11/15 0451) SpO2:  [94 %-99 %] 94 % (11/15 0451) Weight:  [213 lb 6.5 oz (96.8 kg)] 213 lb 6.5 oz (96.8 kg) (11/15 0451)  Pre op weight 99 kg Current Weight  11/04/14 213 lb 6.5 oz (96.8 kg)      Intake/Output from previous day: 11/14 0701 - 11/15 0700 In: 720 [P.O.:720] Out: -    Physical Exam:  Cardiovascular: RRR Pulmonary: Clear to auscultation bilaterally; no rales, wheezes, or rhonchi. Abdomen: Soft, non tender, bowel sounds present. Extremities: Trace bilateral extremity edema. Wounds: LE wounds are all clean and dry.  No erythema or signs of infection. Sternal wound is clean and dry.  Lab Results: CBC:  Recent Labs  11/02/14 0353  WBC 12.6*  HGB 8.8*  HCT 27.9*  PLT 226   BMET:   Recent Labs  11/02/14 0353 11/04/14 0357  NA 140 136*  K 3.9 3.2*  CL 96 92*  CO2 28 29  GLUCOSE 103* 120*  BUN 26* 28*  CREATININE 1.27* 1.32*  CALCIUM 8.9 9.1    PT/INR:  Lab Results  Component Value Date   INR 1.46 10/29/2014   INR 1.12 10/25/2014   INR 1.9* 04/19/2008   ABG:  INR: Will add last result for INR, ABG once components are confirmed Will add last 4 CBG results once components are confirmed  Assessment/Plan:  1. CV - Previous a fib.SR in the 70's this am. On Amiodarone 200  daily, Norvasc 5 daily, and Lopressor 12.5 bid.  No ACE/ARB as creatinine still elevated. 2.  Pulmonary - Was on 2 liters of oxygen via [place], but weaned to room air. Encourage incentive spirometer and flutter valve. 3. Volume Overload - On Lasix 40 daily. Below pre op weight and no LE edema so will stop at discharge. 4.  Acute blood loss anemia - H and H stable at 8.8 and 27.9. ContinueTrinsicon. 5. DM-CBGs 118/138/145. Restarted on Metformin 500 bid. Creatinine slightly increased from 1.27 to 1.32. 6. WBC decreased from 16.2 to 12.6. Remains afebrile.UC showed >100,000 gram negative rods. Will start Cipro and continue for 7 days. I will follow up the sensitivities this week. 7. Per Dr. Roxy Manns, will discharge.   ZIMMERMAN,DONIELLE MPA-C 11/04/2014,8:42 AM

## 2014-11-05 LAB — URINE CULTURE
Colony Count: >=100000
Special Requests: NORMAL

## 2014-11-15 ENCOUNTER — Encounter (HOSPITAL_COMMUNITY): Payer: Self-pay | Admitting: Emergency Medicine

## 2014-11-15 ENCOUNTER — Emergency Department (HOSPITAL_COMMUNITY)
Admission: EM | Admit: 2014-11-15 | Discharge: 2014-11-15 | Disposition: A | Discharge status: Home / Self Care | Payer: No Typology Code available for payment source | Attending: Emergency Medicine | Admitting: Emergency Medicine

## 2014-11-15 DIAGNOSIS — I10 Essential (primary) hypertension: Secondary | ICD-10-CM | POA: Diagnosis not present

## 2014-11-15 DIAGNOSIS — E785 Hyperlipidemia, unspecified: Secondary | ICD-10-CM | POA: Diagnosis not present

## 2014-11-15 DIAGNOSIS — Z7982 Long term (current) use of aspirin: Secondary | ICD-10-CM | POA: Insufficient documentation

## 2014-11-15 DIAGNOSIS — Z9889 Other specified postprocedural states: Secondary | ICD-10-CM | POA: Diagnosis not present

## 2014-11-15 DIAGNOSIS — Z951 Presence of aortocoronary bypass graft: Secondary | ICD-10-CM | POA: Insufficient documentation

## 2014-11-15 DIAGNOSIS — I2510 Atherosclerotic heart disease of native coronary artery without angina pectoris: Secondary | ICD-10-CM | POA: Insufficient documentation

## 2014-11-15 DIAGNOSIS — M1480 Arthropathies in other specified diseases classified elsewhere, unspecified site: Secondary | ICD-10-CM | POA: Diagnosis not present

## 2014-11-15 DIAGNOSIS — M109 Gout, unspecified: Secondary | ICD-10-CM | POA: Diagnosis not present

## 2014-11-15 DIAGNOSIS — Z79899 Other long term (current) drug therapy: Secondary | ICD-10-CM | POA: Insufficient documentation

## 2014-11-15 DIAGNOSIS — E119 Type 2 diabetes mellitus without complications: Secondary | ICD-10-CM | POA: Diagnosis not present

## 2014-11-15 DIAGNOSIS — R2241 Localized swelling, mass and lump, right lower limb: Secondary | ICD-10-CM | POA: Diagnosis present

## 2014-11-15 HISTORY — DX: Gout, unspecified: M10.9

## 2014-11-15 LAB — URIC ACID: URIC ACID, SERUM: 8.2 mg/dL — AB (ref 2.4–7.0)

## 2014-11-15 MED ORDER — KETOROLAC TROMETHAMINE 60 MG/2ML IM SOLN
60.0000 mg | Freq: Once | INTRAMUSCULAR | Status: AC
  Administered 2014-11-15: 60 mg via INTRAMUSCULAR

## 2014-11-15 MED ORDER — KETOROLAC TROMETHAMINE 30 MG/ML IJ SOLN
60.0000 mg | Freq: Once | INTRAMUSCULAR | Status: DC
  Filled 2014-11-15: qty 2

## 2014-11-15 MED ORDER — COLCHICINE 0.6 MG PO TABS: 0.6000 mg | ORAL_TABLET | Freq: Two times a day (BID) | ORAL | Status: DC

## 2014-11-15 MED ORDER — INDOMETHACIN 50 MG PO CAPS: ORAL_CAPSULE | ORAL | Status: DC

## 2014-11-15 MED ORDER — ONDANSETRON 4 MG PO TBDP
4.0000 mg | ORAL_TABLET | Freq: Once | ORAL | Status: AC
  Administered 2014-11-15: 4 mg via ORAL
  Filled 2014-11-15: qty 1

## 2014-11-15 MED ORDER — COLCHICINE 0.6 MG PO TABS
0.6000 mg | ORAL_TABLET | Freq: Once | ORAL | Status: AC
  Administered 2014-11-15: 0.6 mg via ORAL
  Filled 2014-11-15: qty 1

## 2014-11-15 MED ORDER — ALLOPURINOL 300 MG PO TABS: 300.0000 mg | ORAL_TABLET | Freq: Every day | ORAL | Status: DC

## 2014-11-15 NOTE — Discharge Instructions (Signed)
Take the medications as prescribed. Look at the low purine diet sheet to help prevent foods/drinks that can make gout worse.  Recheck if you get a high fever, your pain gets worse, or you get chest pain or shortness of breath.    Gout Gout is an inflammatory arthritis caused by a buildup of uric acid crystals in the joints. Uric acid is a chemical that is normally present in the blood. When the level of uric acid in the blood is too high it can form crystals that deposit in your joints and tissues. This causes joint redness, soreness, and swelling (inflammation). Repeat attacks are common. Over time, uric acid crystals can form into masses (tophi) near a joint, destroying bone and causing disfigurement. Gout is treatable and often preventable. CAUSES  The disease begins with elevated levels of uric acid in the blood. Uric acid is produced by your body when it breaks down a naturally found substance called purines. Certain foods you eat, such as meats and fish, contain high amounts of purines. Causes of an elevated uric acid level include:  Being passed down from parent to child (heredity).  Diseases that cause increased uric acid production (such as obesity, psoriasis, and certain cancers).  Excessive alcohol use.  Diet, especially diets rich in meat and seafood.  Medicines, including certain cancer-fighting medicines (chemotherapy), water pills (diuretics), and aspirin.  Chronic kidney disease. The kidneys are no longer able to remove uric acid well.  Problems with metabolism. Conditions strongly associated with gout include:  Obesity.  High blood pressure.  High cholesterol.  Diabetes. Not everyone with elevated uric acid levels gets gout. It is not understood why some people get gout and others do not. Surgery, joint injury, and eating too much of certain foods are some of the factors that can lead to gout attacks. SYMPTOMS   An attack of gout comes on quickly. It causes intense  pain with redness, swelling, and warmth in a joint.  Fever can occur.  Often, only one joint is involved. Certain joints are more commonly involved:  Base of the big toe.  Knee.  Ankle.  Wrist.  Finger. Without treatment, an attack usually goes away in a few days to weeks. Between attacks, you usually will not have symptoms, which is different from many other forms of arthritis. DIAGNOSIS  Your caregiver will suspect gout based on your symptoms and exam. In some cases, tests may be recommended. The tests may include:  Blood tests.  Urine tests.  X-rays.  Joint fluid exam. This exam requires a needle to remove fluid from the joint (arthrocentesis). Using a microscope, gout is confirmed when uric acid crystals are seen in the joint fluid. TREATMENT  There are two phases to gout treatment: treating the sudden onset (acute) attack and preventing attacks (prophylaxis).  Treatment of an Acute Attack.  Medicines are used. These include anti-inflammatory medicines or steroid medicines.  An injection of steroid medicine into the affected joint is sometimes necessary.  The painful joint is rested. Movement can worsen the arthritis.  You may use warm or cold treatments on painful joints, depending which works best for you.  Treatment to Prevent Attacks.  If you suffer from frequent gout attacks, your caregiver may advise preventive medicine. These medicines are started after the acute attack subsides. These medicines either help your kidneys eliminate uric acid from your body or decrease your uric acid production. You may need to stay on these medicines for a very long time.  The  early phase of treatment with preventive medicine can be associated with an increase in acute gout attacks. For this reason, during the first few months of treatment, your caregiver may also advise you to take medicines usually used for acute gout treatment. Be sure you understand your caregiver's directions.  Your caregiver may make several adjustments to your medicine dose before these medicines are effective.  Discuss dietary treatment with your caregiver or dietitian. Alcohol and drinks high in sugar and fructose and foods such as meat, poultry, and seafood can increase uric acid levels. Your caregiver or dietitian can advise you on drinks and foods that should be limited. HOME CARE INSTRUCTIONS   Do not take aspirin to relieve pain. This raises uric acid levels.  Only take over-the-counter or prescription medicines for pain, discomfort, or fever as directed by your caregiver.  Rest the joint as much as possible. When in bed, keep sheets and blankets off painful areas.  Keep the affected joint raised (elevated).  Apply warm or cold treatments to painful joints. Use of warm or cold treatments depends on which works best for you.  Use crutches if the painful joint is in your leg.  Drink enough fluids to keep your urine clear or pale yellow. This helps your body get rid of uric acid. Limit alcohol, sugary drinks, and fructose drinks.  Follow your dietary instructions. Pay careful attention to the amount of protein you eat. Your daily diet should emphasize fruits, vegetables, whole grains, and fat-free or low-fat milk products. Discuss the use of coffee, vitamin C, and cherries with your caregiver or dietitian. These may be helpful in lowering uric acid levels.  Maintain a healthy body weight. SEEK MEDICAL CARE IF:   You develop diarrhea, vomiting, or any side effects from medicines.  You do not feel better in 24 hours, or you are getting worse. SEEK IMMEDIATE MEDICAL CARE IF:   Your joint becomes suddenly more tender, and you have chills or a fever. MAKE SURE YOU:   Understand these instructions.  Will watch your condition.  Will get help right away if you are not doing well or get worse. Document Released: 12/04/2000 Document Revised: 04/23/2014 Document Reviewed:  07/20/2012 [facility] Patient Information 2015 [place], [place]. This information is not intended to replace advice given to you by your health care provider. Make sure you discuss any questions you have with your health care provider.  Low-Purine Diet Purines are compounds that affect the level of uric acid in your body. A low-purine diet is a diet that is low in purines. Eating a low-purine diet can prevent the level of uric acid in your body from getting too high and causing gout or kidney stones or both. WHAT DO I NEED TO KNOW ABOUT THIS DIET?  Choose low-purine foods. Examples of low-purine foods are listed in the next section.  Drink plenty of fluids, especially water. Fluids can help remove uric acid from your body. Try to drink 8-16 cups (1.9-3.8 L) a day.  Limit foods high in fat, especially saturated fat, as fat makes it harder for the body to get rid of uric acid. Foods high in saturated fat include pizza, cheese, ice cream, whole milk, fried foods, and gravies. Choose foods that are lower in fat and lean sources of protein. Use olive oil when cooking as it contains healthy fats that are not high in saturated fat.  Limit alcohol. Alcohol interferes with the elimination of uric acid from your body. If you are having a  gout attack, avoid all alcohol.  Keep in mind that different people's bodies react differently to different foods. You will probably learn over time which foods do or do not affect you. If you discover that a food tends to cause your gout to flare up, avoid eating that food. You can more freely enjoy foods that do not cause problems. If you have any questions about a food item, talk to your dietitian or health care provider. WHICH FOODS ARE LOW, MODERATE, AND HIGH IN PURINES? The following is a list of foods that are low, moderate, and high in purines. You can eat any amount of the foods that are low in purines. You may be able to have small amounts of foods that are moderate in  purines. Ask your health care provider how much of a food moderate in purines you can have. Avoid foods high in purines. Grains  Foods low in purines: Enriched white bread, pasta, rice, cake, cornbread, popcorn.  Foods moderate in purines: Whole-grain breads and cereals, wheat germ, bran, oatmeal. Uncooked oatmeal. Dry wheat bran or wheat germ.  Foods high in purines: Pancakes, [place] toast, biscuits, muffins. Vegetables  Foods low in purines: All vegetables, except those that are moderate in purines.  Foods moderate in purines: Asparagus, cauliflower, spinach, mushrooms, green peas. Fruits  All fruits are low in purines. Meats and other Protein Foods  Foods low in purines: Eggs, nuts, peanut butter.  Foods moderate in purines: 80-90% lean beef, lamb, veal, pork, poultry, fish, eggs, peanut butter, nuts. Crab, lobster, oysters, and shrimp. Cooked dried beans, peas, and lentils.  Foods high in purines: Anchovies, sardines, herring, mussels, tuna, codfish, scallops, trout, and haddock. Berniece Salines. Organ meats (such as liver or kidney). Tripe. Game meat. Goose. Sweetbreads. Dairy  All dairy foods are low in purines. Low-fat and fat-free dairy products are best because they are low in saturated fat. Beverages  Drinks low in purines: Water, carbonated beverages, tea, coffee, cocoa.  Drinks moderate in purines: Soft drinks and other drinks sweetened with high-fructose corn syrup. Juices. To find whether a food or drink is sweetened with high-fructose corn syrup, look at the ingredients list.  Drinks high in purines: Alcoholic beverages (such as beer). Condiments  Foods low in purines: Salt, herbs, olives, pickles, relishes, vinegar.  Foods moderate in purines: Butter, margarine, oils, mayonnaise. Fats and Oils  Foods low in purines: All types, except gravies and sauces made with meat.  Foods high in purines: Gravies and sauces made with meat. Other Foods  Foods low in purines:  Sugars, sweets, gelatin. Cake. Soups made without meat.  Foods moderate in purines: Meat-based or fish-based soups, broths, or bouillons. Foods and drinks sweetened with high-fructose corn syrup.  Foods high in purines: High-fat desserts (such as ice cream, cookies, cakes, pies, doughnuts, and chocolate). Contact your dietitian for more information on foods that are not listed here. Document Released: 04/03/2011 Document Revised: 12/12/2013 Document Reviewed: 11/13/2013 [facility] Patient Information 2015 [place], [place]. This information is not intended to replace advice given to you by your health care provider. Make sure you discuss any questions you have with your health care provider.

## 2014-11-15 NOTE — ED Notes (Addendum)
Rt leg swelling and discomfort. Had cabg  On 11/9 and vein taken from this leg.  Nausea since surgery  Done, thinks is due to metformin

## 2014-11-15 NOTE — ED Provider Notes (Signed)
CSN: [id number]     Arrival date & time 11/15/14  2036 History  This chart was scribed for Amarise Norrie, MD by Tula Nakayama, ED Scribe. This patient was seen in room APA14/APA14 and the patient's care was started at 9:13 PM.   Chief Complaint  Patient presents with  . Leg Swelling   The history is provided by the patient. No language interpreter was used.    HPI Comments: Karina Green is a 57 y.o. female who presents to the Emergency Department complaining of right, lower leg swelling that started 2 days ago. She notes right knee pain and difficulty walking because of pain in her right foot as associated symptoms. Pt states she had bypass from right knee 2 weeks ago performed by Dr. Prescott Gum. Pt denies recent travel, history of right leg swelling, smoking, changes in diet and EtOH use. 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 Pt states that current symptoms are similar to prior history of gout. She reports that she typically has gout flareups 3-4 times each year which are normally treated with prednisone. Her chart shows a uric acid level 2 years ago which measured 9.9. She denies history of kidney problems. Pt denies CP and SOB as associated symptoms. She is concerned she is having a blood clot.  PCP is Dr. Orson Ape   Past Medical History  Diagnosis Date  . Hypertension   . Back pain   . Arthritis   . Hyperlipidemia   . CAD (coronary artery disease), native coronary artery-70% LM stenosis 10/25/2014  . DM type 2 (diabetes mellitus, type 2) 10/25/14  . Gout    Past Surgical History  Procedure Laterality Date  . Tonsillectomy    . Bladder tack    . Total hip arthroplasty    . Hernia repair    . Cholecystectomy    . Abdominal hysterectomy    . Appendectomy    . Cardiac catheterization  10/25/14    70% LM stensis, normal EF  . Coronary artery bypass graft N/A 10/29/2014    Procedure: CORONARY ARTERY BYPASS GRAFTING (CABG) TIMES TWO USING LEFT INTERNAL  MAMMARY ARTERY AND RIGHT LEG GREATER SAPHENOUS VEIN HARVESTED ENDOSCOPICALLY.;  Surgeon: Ivin Poot, MD;  Location: [facility];  Service: Open Heart Surgery;  Laterality: N/A;  . Intraoperative transesophageal echocardiogram N/A 10/29/2014    Procedure: INTRAOPERATIVE TRANSESOPHAGEAL ECHOCARDIOGRAM;  Surgeon: Ivin Poot, MD;  Location: [facility];  Service: Open Heart Surgery;  Laterality: N/A;   Family History  Problem Relation Age of Onset  . Hypertension Mother   . Hypertension Father   . Coronary artery disease Mother     CABG  . CAD Father     CABG  . Coronary artery disease Brother     Stent   History  Substance Use Topics  . Smoking status: Never Smoker   . Smokeless tobacco: Not on file  . Alcohol Use: No   Lives with boyfriend employed  OB History    No data available     Review of Systems  Respiratory: Negative for shortness of breath.   Cardiovascular: Positive for leg swelling. Negative for chest pain.  Musculoskeletal: Positive for joint swelling and arthralgias.  All other systems reviewed and are negative.     Allergies  Review of patient's allergies indicates no known allergies.  Home Medications   Prior to Admission medications   Medication Sig Start Date End Date Taking? Authorizing Provider  allopurinol (ZYLOPRIM) 300 MG tablet Take 1 tablet (300 mg total) by mouth daily. 11/15/14   Avion Norrie, MD  ALPRAZolam Duanne Moron) 0.5 MG tablet Take 0.5 mg by mouth daily as needed. Panic attacks/anxiety.    Historical Provider, MD  amiodarone (PACERONE) 200 MG tablet Take 1 tablet (200 mg total) by mouth daily. 11/04/14   Donielle Liston Alba, PA-C  aspirin EC 325 MG EC tablet Take 1 tablet (325 mg total) by mouth daily. 11/04/14   Donielle Liston Alba, PA-C  atorvastatin (LIPITOR) 80 MG tablet Take 1 tablet (80 mg total) by mouth daily at 6 PM. 11/04/14   Donielle Liston Alba, PA-C  ciprofloxacin (CIPRO) 500 MG tablet Take 1 tablet (500 mg total) by mouth 2  (two) times daily. For one week then stop. 11/04/14   Donielle Liston Alba, PA-C  ferrous [id number] C-folic acid (TRINSICON / FOLTRIN) capsule Take 1 capsule by mouth daily. For one month then stop. 11/04/14   Donielle Liston Alba, PA-C  metFORMIN (GLUCOPHAGE) 500 MG tablet Take 1 tablet (500 mg total) by mouth 2 (two) times daily with a meal. 11/04/14   Donielle Liston Alba, PA-C  metoprolol tartrate (LOPRESSOR) 25 MG tablet Take 0.5 tablets (12.5 mg total) by mouth 2 (two) times daily. 11/04/14   Donielle Liston Alba, PA-C  oxyCODONE (OXY IR/ROXICODONE) 5 MG immediate release tablet Take 1 tablet (5 mg total) by mouth every 4 (four) hours as needed for severe pain. 11/04/14   Donielle Liston Alba, PA-C   BP 142/52 mmHg  Pulse 93  Temp(Src) 99.3 F (37.4 C) (Oral)  Resp 20  Ht 5\' 3"  (1.6 m)  Wt 213 lb (96.616 kg)  BMI 37.74 kg/m2  SpO2 99%  Vital signs normal   Physical Exam  Constitutional: She is oriented to person, place, and time. She appears well-developed and well-nourished.  Non-toxic appearance. She does not appear ill. No distress.  HENT:  Head: Normocephalic and atraumatic.  Right Ear: External ear normal.  Left Ear: External ear normal.  Nose: Nose normal. No mucosal edema or rhinorrhea.  Mouth/Throat: Mucous membranes are normal. No dental abscesses or uvula swelling.  Eyes: Conjunctivae and EOM are normal. Pupils are equal, round, and reactive to light.  Neck: Normal range of motion and full passive range of motion without pain. Neck supple.  Pulmonary/Chest: Effort normal. No respiratory distress. She has no rhonchi. She exhibits no crepitus.  Abdominal: Normal appearance.  Musculoskeletal: Normal range of motion. She exhibits no edema or tenderness.  Redness and swelling to MTP of the right great toe. Redness to dorsum of foot and a small area and around ankle. Mild swelling to right knee. Questionable effusion. No calf swelling, pain or redness. Well-healing  surgical scar from donor vein site.   Neurological: She is alert and oriented to person, place, and time. She has normal strength. No cranial nerve deficit.  Skin: Skin is warm, dry and intact. No rash noted. No erythema. No pallor.  Psychiatric: She has a normal mood and affect. Her speech is normal and behavior is normal. Her mood appears not anxious.  Nursing note and vitals reviewed.   ED Course  Procedures (including critical care time)  Medications  ketorolac (TORADOL) injection 60 mg (60 mg Intramuscular Given 11/15/14 2138)  ondansetron (ZOFRAN-ODT) disintegrating tablet 4 mg (4 mg Oral Given 11/15/14 2147)  colchicine tablet 0.6 mg (0.6 mg Oral Given 11/15/14 2306)    DIAGNOSTIC STUDIES: Oxygen Saturation is 99% on RA, normal by my  interpretation.    COORDINATION OF CARE: 9:20 PM Discussed treatment plan with pt which includes Toradol injection. Pt agreed to plan.  10:45 PM Pt states that she is feeling better after Toradol. She reports taking Allopurinol with relief after 1st episode of gout.    Labs Review Results for orders placed or performed during the hospital encounter of 11/15/14  Uric acid  Result Value Ref Range   Uric Acid, Serum 8.2 (H) 2.4 - 7.0 mg/dL   Laboratory interpretation all normal except elevated uric acid     Imaging Review No results found.   EKG Interpretation None      MDM   Final diagnoses:  Acute gouty arthritis    New Prescriptions   ALLOPURINOL (ZYLOPRIM) 300 MG TABLET    Take 1 tablet (300 mg total) by mouth daily.   COLCHICINE 0.6 MG TABLET    Take 1 tablet (0.6 mg total) by mouth 2 (two) times daily.   INDOMETHACIN (INDOCIN) 50 MG CAPSULE    Take 1 po QID x 2d then 1 po TID x 2d then 1 po BID x 2d then 1 po QD x 2d    Plan discharge   Rolland Porter, MD, FACEP   I personally performed the services described in this documentation, which was scribed in my presence. The recorded information has been reviewed and  considered.  Rolland Porter, MD, Abram Sander     Xitlalli Norrie, MD 11/15/14 [phone]

## 2014-11-19 NOTE — Progress Notes (Signed)
HPI: [name] is a 58 year old, obese female patient, where following post hospitalizationafter initially seen on consultation at [facility], in the setting of unstable angina. The patient was transferred to [facility], where she underwent cardiac catheterization. This demonstrated a 75% left main stenosis.other vessels were angiographically normal. She was subsequently referred to cardiothoracic surgeon, Dr. Tharon Aquas Tright. The patient had coronary artery bypass grafting x2 with LIMA to LAD, SVG to OM. This was completed on 10/29/2014.  The patient also had some postoperative complications revealing gram-negative rods, greater than 100,000.She was started on Cipro.She was also noted to have new onset type 2 diabetes. She had some postoperative atrial fibrillation. She was started on oral amiodarone and converted to normal sinus rhythm she was started on metformin. She was continued on amiodarone 200 mg daily, aspirin, atorvastatin, and metoprolol 25 mg one half tablet twice a day.  She has been seen in ER with gouty arthritis 4 days ago and is on colchicine. She comes without complaints with the exception of nausea associated with metformin. She has stopped taking it. She has not yet seen her PCP. She denies significant chest pain, only soreness at the incision site. She is walking at home, and has yet to be seen and established with cardiac rehab.   No Known Allergies  Current Outpatient Prescriptions  Medication Sig Dispense Refill  . allopurinol (ZYLOPRIM) 300 MG tablet Take 1 tablet (300 mg total) by mouth daily. 10 tablet 0  . ALPRAZolam (XANAX) 0.5 MG tablet Take 0.5 mg by mouth daily as needed. Panic attacks/anxiety.    Marland Kitchen amiodarone (PACERONE) 200 MG tablet Take 1 tablet (200 mg total) by mouth daily. 90 tablet 3  . aspirin EC 325 MG EC tablet Take 1 tablet (325 mg total) by mouth daily. 30 tablet 0  . atorvastatin (LIPITOR) 80 MG tablet Take 1 tablet (80 mg total) by mouth daily at 6 PM.  90 tablet 3  . ciprofloxacin (CIPRO) 500 MG tablet Take 1 tablet (500 mg total) by mouth 2 (two) times daily. For one week then stop. 13 tablet 0  . colchicine 0.6 MG tablet Take 1 tablet (0.6 mg total) by mouth 2 (two) times daily. 20 tablet 0  . ferrous [id number] C-folic acid (TRINSICON / FOLTRIN) capsule Take 1 capsule by mouth daily. For one month then stop. 30 capsule 0  . indomethacin (INDOCIN) 50 MG capsule Take 1 po QID x 2d then 1 po TID x 2d then 1 po BID x 2d then 1 po QD x 2d 40 capsule 0  . metFORMIN (GLUCOPHAGE) 500 MG tablet Take 1 tablet (500 mg total) by mouth 2 (two) times daily with a meal. 60 tablet 1  . metoprolol tartrate (LOPRESSOR) 25 MG tablet Take 0.5 tablets (12.5 mg total) by mouth 2 (two) times daily. 60 tablet 3  . oxyCODONE (OXY IR/ROXICODONE) 5 MG immediate release tablet Take 1 tablet (5 mg total) by mouth every 4 (four) hours as needed for severe pain. 30 tablet 0   No current facility-administered medications for this visit.    Past Medical History  Diagnosis Date  . Hypertension   . Back pain   . Arthritis   . Hyperlipidemia   . CAD (coronary artery disease), native coronary artery-70% LM stenosis 10/25/2014  . DM type 2 (diabetes mellitus, type 2) 10/25/14  . Gout     Past Surgical History  Procedure Laterality Date  . Tonsillectomy    . Bladder tack    .  Total hip arthroplasty    . Hernia repair    . Cholecystectomy    . Abdominal hysterectomy    . Appendectomy    . Cardiac catheterization  10/25/14    70% LM stensis, normal EF  . Coronary artery bypass graft N/A 10/29/2014    Procedure: CORONARY ARTERY BYPASS GRAFTING (CABG) TIMES TWO USING LEFT INTERNAL MAMMARY ARTERY AND RIGHT LEG GREATER SAPHENOUS VEIN HARVESTED ENDOSCOPICALLY.;  Surgeon: Ivin Poot, MD;  Location: [facility];  Service: Open Heart Surgery;  Laterality: N/A;  . Intraoperative transesophageal echocardiogram N/A 10/29/2014    Procedure: INTRAOPERATIVE TRANSESOPHAGEAL  ECHOCARDIOGRAM;  Surgeon: Ivin Poot, MD;  Location: [facility];  Service: Open Heart Surgery;  Laterality: N/A;    ROS: Complete review of systems performed and found to be negative unless outlined above  PHYSICAL EXAM BP 158/88 mmHg  Pulse 75  Ht 5\' 3"  (1.6 m)  Wt 204 lb (92.534 kg)  BMI 36.15 kg/m2 General: Well developed, well nourished, in no acute distress, morbid obesity.  Head: Eyes PERRLA, No xanthomas.   Normal cephalic and atramatic  Lungs: Clear bilaterally to auscultation and percussion. Heart: HRRR S1 S2, without MRG.  Pulses are 2+ & equal.            No carotid bruit. No JVD.  No abdominal bruits. No femoral bruits. Abdomen: Bowel sounds are positive, abdomen soft and non-tender without masses or                  Hernia's noted. Msk:  Back normal, normal gait. Normal strength and tone for age.Well healed sternotomy incision, no evidence of infection.  Extremities: No clubbing, cyanosis or edema.  DP +1. Bilateral vein harvest sites at the popliteal area.  Neuro: Alert and oriented X 3. Psych:  Good affect, responds appropriately   ASSESSMENT AND PLAN

## 2014-11-20 ENCOUNTER — Ambulatory Visit (INDEPENDENT_AMBULATORY_CARE_PROVIDER_SITE_OTHER): Payer: No Typology Code available for payment source | Admitting: Adult Health

## 2014-11-20 ENCOUNTER — Encounter: Payer: Self-pay | Admitting: Adult Health

## 2014-11-20 VITALS — BP 158/88 | HR 75 | Ht 63.0 in | Wt 204.0 lb

## 2014-11-20 DIAGNOSIS — I10 Essential (primary) hypertension: Secondary | ICD-10-CM

## 2014-11-20 DIAGNOSIS — E7800 Pure hypercholesterolemia, unspecified: Secondary | ICD-10-CM

## 2014-11-20 DIAGNOSIS — I2510 Atherosclerotic heart disease of native coronary artery without angina pectoris: Secondary | ICD-10-CM

## 2014-11-20 MED ORDER — AMIODARONE HCL 200 MG PO TABS: 200.0000 mg | ORAL_TABLET | Freq: Every day | ORAL | Status: DC

## 2014-11-20 MED ORDER — METOPROLOL TARTRATE 25 MG PO TABS: 12.5000 mg | ORAL_TABLET | Freq: Two times a day (BID) | ORAL | Status: DC

## 2014-11-20 MED ORDER — ATORVASTATIN CALCIUM 80 MG PO TABS: 80.0000 mg | ORAL_TABLET | Freq: Every day | ORAL | Status: DC

## 2014-11-20 NOTE — Assessment & Plan Note (Signed)
Slightly elevated currently but she is not taking the BB as ordered, as she was given Rx for this on discharge, per patient. She will begin the metoprolol as above. Followup appt in 6 weeks.

## 2014-11-20 NOTE — Assessment & Plan Note (Signed)
Continue statin therapy. Will recheck status in 3 months.

## 2014-11-20 NOTE — Progress Notes (Deleted)
Name: Karina Green    DOB: 1956/12/08  Age: 58 y.o.  MR#: [id number]       PCP:  Leonides Grills, MD      Insurance: Payor: [employer] MULTIPLAN / Plan: MULTIPLAN [employer] / Product Type: *No Product type* /   CC:    Chief Complaint  Patient presents with  . Coronary Artery Disease    Multivessel with LM disease  . Hypertension    VS Filed Vitals:   11/20/14 1416  BP: 158/88  Pulse: 75  Height: 5\' 3"  (1.6 m)  Weight: 204 lb (92.534 kg)    Weights Current Weight  11/20/14 204 lb (92.534 kg)  11/15/14 213 lb (96.616 kg)  11/04/14 213 lb 6.5 oz (96.8 kg)    Blood Pressure  BP Readings from Last 3 Encounters:  11/20/14 158/88  11/15/14 142/52  11/04/14 118/62     Admit date:  (Not on file) Last encounter with RMR:  Visit date not found   Allergy Review of patient's allergies indicates no known allergies.  Current Outpatient Prescriptions  Medication Sig Dispense Refill  . allopurinol (ZYLOPRIM) 300 MG tablet Take 1 tablet (300 mg total) by mouth daily. 10 tablet 0  . ALPRAZolam (XANAX) 0.5 MG tablet Take 0.5 mg by mouth daily as needed. Panic attacks/anxiety.    Marland Kitchen amiodarone (PACERONE) 200 MG tablet Take 1 tablet (200 mg total) by mouth daily. 30 tablet 1  . aspirin EC 325 MG EC tablet Take 1 tablet (325 mg total) by mouth daily. 30 tablet 0  . atorvastatin (LIPITOR) 80 MG tablet Take 1 tablet (80 mg total) by mouth daily at 6 PM. 30 tablet 1  . ciprofloxacin (CIPRO) 500 MG tablet Take 1 tablet (500 mg total) by mouth 2 (two) times daily. For one week then stop. 13 tablet 0  . colchicine 0.6 MG tablet Take 1 tablet (0.6 mg total) by mouth 2 (two) times daily. 20 tablet 0  . ferrous [id number] C-folic acid (TRINSICON / FOLTRIN) capsule Take 1 capsule by mouth daily. For one month then stop. 30 capsule 0  . indomethacin (INDOCIN) 50 MG capsule Take 1 po QID x 2d then 1 po TID x 2d then 1 po BID x 2d then 1 po QD x 2d 40 capsule 0  . metFORMIN (GLUCOPHAGE) 500 MG tablet  Take 1 tablet (500 mg total) by mouth 2 (two) times daily with a meal. 60 tablet 1  . metoprolol tartrate (LOPRESSOR) 25 MG tablet Take 0.5 tablets (12.5 mg total) by mouth 2 (two) times daily. 30 tablet 1  . oxyCODONE (OXY IR/ROXICODONE) 5 MG immediate release tablet Take 1 tablet (5 mg total) by mouth every 4 (four) hours as needed for severe pain. 30 tablet 0   No current facility-administered medications for this visit.    Discontinued Meds:   There are no discontinued medications.  Patient Active Problem List   Diagnosis Date Noted  . CAD (coronary artery disease) 10/29/2014  . CAD (coronary artery disease), native coronary artery-70% LM stenosis 10/26/2014  . DM2 (diabetes mellitus, type 2) 10/26/2014  . Unstable angina 10/25/2014  . Chest pain 10/24/2014  . Essential hypertension 10/24/2014  . Hypercholesteremia 10/24/2014    LABS    Component Value Date/Time   NA 136* 11/04/2014 0357   NA 140 11/02/2014 0353   NA 138 11/01/2014 0301   K 3.2* 11/04/2014 0357   K 3.9 11/02/2014 0353   K 3.4* 11/01/2014 0301   CL 92* 11/04/2014  0357   CL 96 11/02/2014 0353   CL 97 11/01/2014 0301   CO2 29 11/04/2014 0357   CO2 28 11/02/2014 0353   CO2 25 11/01/2014 0301   GLUCOSE 120* 11/04/2014 0357   GLUCOSE 103* 11/02/2014 0353   GLUCOSE 116* 11/01/2014 0301   BUN 28* 11/04/2014 0357   BUN 26* 11/02/2014 0353   BUN 22 11/01/2014 0301   CREATININE 1.32* 11/04/2014 0357   CREATININE 1.27* 11/02/2014 0353   CREATININE 1.34* 11/01/2014 0301   CALCIUM 9.1 11/04/2014 0357   CALCIUM 8.9 11/02/2014 0353   CALCIUM 8.8 11/01/2014 0301   GFRNONAA 43* 11/04/2014 0357   GFRNONAA 45* 11/02/2014 0353   GFRNONAA 42* 11/01/2014 0301   GFRAA 50* 11/04/2014 0357   GFRAA 52* 11/02/2014 0353   GFRAA 49* 11/01/2014 0301   CMP     Component Value Date/Time   NA 136* 11/04/2014 0357   K 3.2* 11/04/2014 0357   CL 92* 11/04/2014 0357   CO2 29 11/04/2014 0357   GLUCOSE 120* 11/04/2014 0357    BUN 28* 11/04/2014 0357   CREATININE 1.32* 11/04/2014 0357   CALCIUM 9.1 11/04/2014 0357   PROT 7.8 10/24/2014 2037   ALBUMIN 3.6 10/24/2014 2037   AST 16 10/24/2014 2037   ALT 17 10/24/2014 2037   ALKPHOS 74 10/24/2014 2037   BILITOT 0.2* 10/24/2014 2037   GFRNONAA 43* 11/04/2014 0357   GFRAA 50* 11/04/2014 0357       Component Value Date/Time   WBC 12.6* 11/02/2014 0353   WBC 16.2* 11/01/2014 0301   WBC 15.3* 10/31/2014 0451   HGB 8.8* 11/02/2014 0353   HGB 9.2* 11/01/2014 0301   HGB 8.6* 10/31/2014 0451   HCT 27.9* 11/02/2014 0353   HCT 28.8* 11/01/2014 0301   HCT 26.4* 10/31/2014 0451   MCV 84.8 11/02/2014 0353   MCV 85.0 11/01/2014 0301   MCV 84.6 10/31/2014 0451    Lipid Panel     Component Value Date/Time   CHOL 177 10/25/2014 0407   TRIG 235* 10/25/2014 0407   HDL 30* 10/25/2014 0407   CHOLHDL 5.9 10/25/2014 0407   VLDL 47* 10/25/2014 0407   LDLCALC 100* 10/25/2014 0407    ABG    Component Value Date/Time   PHART 7.365 10/29/2014 2020   PCO2ART 38.6 10/29/2014 2020   PO2ART 83.0 10/29/2014 2020   HCO3 22.2 10/29/2014 2020   TCO2 21 10/30/2014 1610   ACIDBASEDEF 3.0* 10/29/2014 2020   O2SAT 96.0 10/29/2014 2020     No results found for: TSH BNP (last 3 results) No results for input(s): PROBNP in the last 8760 hours. Cardiac Panel (last 3 results) No results for input(s): CKTOTAL, CKMB, TROPONINI, RELINDX in the last 72 hours.  Iron/TIBC/Ferritin/ %Sat    Component Value Date/Time   IRON 70 10/26/2014 1052   TIBC 253 10/26/2014 1052   FERRITIN 73 10/26/2014 1052   IRONPCTSAT 28 10/26/2014 1052     EKG Orders placed or performed during the hospital encounter of 10/24/14  . EKG 12-Lead  . EKG 12-Lead  . EKG 12-Lead  . EKG 12-Lead  . EKG 12-Lead  . EKG 12-Lead  . EKG 12-Lead  . EKG 12-Lead  . EKG 12-Lead  . EKG 12-Lead  . EKG 12-Lead  . EKG 12-Lead  . EKG 12-Lead  . EKG 12-Lead     Prior Assessment and Plan Problem List as of  11/20/2014      Cardiovascular and Mediastinum   Essential hypertension  Unstable angina   CAD (coronary artery disease), native coronary artery-70% LM stenosis   CAD (coronary artery disease)     Endocrine   DM2 (diabetes mellitus, type 2)     Other   Chest pain   Hypercholesteremia       Imaging: Dg Chest 2 View  11/01/2014   CLINICAL DATA:  Sore chest post CABG  EXAM: CHEST  2 VIEW  COMPARISON:  10/31/2014  FINDINGS: Enlargement of cardiac silhouette post CABG.  Mediastinal contours normal.  Slight pulmonary vascular congestion.  Bibasilar atelectasis and pleural effusions greater on LEFT.  Atelectasis LEFT mid lung.  Epicardial pacing wires noted.  Remaining lungs clear.  No pneumothorax.  BILATERAL AC joint degenerative changes.  IMPRESSION: Enlargement of cardiac silhouette with slight pulmonary vascular congestion post CABG.  Persistent bibasilar effusions and atelectasis greater on LEFT.   Electronically Signed   By: Lavonia Dana M.D.   On: 11/01/2014 07:56   Dg Chest 2 View  10/27/2014   CLINICAL DATA:  Preop for CABG.  EXAM: CHEST  2 VIEW  COMPARISON:  10/24/2014  FINDINGS: Borderline cardiomegaly. Negative aortic and hilar contours. Stable prominence of the first costochondral junction density, also present in 2009. There is no edema, consolidation, effusion, or pneumothorax.  IMPRESSION: No active cardiopulmonary disease.   Electronically Signed   By: Jorje Guild M.D.   On: 10/27/2014 07:34   Dg Chest Port 1 View  10/31/2014   CLINICAL DATA:  Coronary artery disease post CABG, history hypertension, diabetes  EXAM: PORTABLE CHEST - 1 VIEW  COMPARISON:  Portable exam 0608 hr compared to 10/30/2014  FINDINGS: Interval removal of LEFT thoracostomy tube, mediastinal drain, and Swan-Ganz catheter.  RIGHT jugular catheter unchanged tip projecting over confluence of SVC.  Enlargement of cardiac silhouette post CABG.  Rotated to the LEFT.  LEFT pleural effusion and scattered LEFT  lung atelectasis.  Minimal RIGHT basilar atelectasis.  No pneumothorax following chest tube removal.  IMPRESSION: Scattered BILATERAL atelectasis greater on LEFT with LEFT pleural effusion.  No pneumothorax following chest tube removal.   Electronically Signed   By: Lavonia Dana M.D.   On: 10/31/2014 08:07   Dg Chest Port 1 View  10/30/2014   CLINICAL DATA:  CABG.  EXAM: PORTABLE CHEST - 1 VIEW  COMPARISON:  10/29/2014.  FINDINGS: Interim removal of endotracheal tube and NG tube. Swan-Ganz catheter, mediastinal drainage catheter, and left chest tube in stable position. Prior CABG. Stable cardiomegaly with normal pulmonary vascularity. Interim resolution of pulmonary venous congestion. Left lower lobe atelectasis and/or infiltrate . No pneumothorax. Small left pleural effusion cannot be excluded. No acute osseous abnormality.  IMPRESSION: 1. Interim removal of endotracheal tube and NG tube. Swan-Ganz catheter, mediastinal drains catheter, left chest tube in stable position. 2. Stable cardiomegaly. Interim resolution of pulmonary venous congestion. 3. Left lower lobe atelectasis and or infiltrate. A small left pleural effusion cannot be excluded.   Electronically Signed   By: Marcello Moores  Register   On: 10/30/2014 07:21   Dg Chest Port 1 View  10/29/2014   CLINICAL DATA:  Endotracheal tube is repositioned.  EXAM: PORTABLE CHEST - 1 VIEW  COMPARISON:  October 29, 2014 1:30 p.m.  FINDINGS: The heart size and mediastinal contours are stable. The heart size is enlarged. Endotracheal tube is identified distal tip 3.5 cm from carina. Nasogastric tube and Swan-Ganz catheter, left chest tube are unchanged. There is consolidation of left lung base unchanged. There is central pulmonary vascular congestion unchanged. There is  no pneumothorax The visualized skeletal structures are stable.  IMPRESSION: Endotracheal tube in good position.  There is no pneumothorax.   Electronically Signed   By: Abelardo Diesel M.D.   On: 10/29/2014  14:49   Dg Chest Port 1 View  10/29/2014   ADDENDUM REPORT: 10/29/2014 14:01  ADDENDUM: Critical Value/emergent results were called by telephone at the time of interpretation on 10/29/2014 at 1:59 pm to Dr. Nils Pyle, who verbally acknowledged these results.   Electronically Signed   By: Shon Hale M.D.   On: 10/29/2014 14:01   10/29/2014   CLINICAL DATA:  Status post CABG x2.  EXAM: PORTABLE CHEST - 1 VIEW  COMPARISON:  10/27/2014  FINDINGS: Endotracheal tube is in place with tip likely just above the carina or within the proximal right mainstem bronchus. Consider withdrawing endotracheal tube approximately 2 cm and repeating film for position assessment. Right-sided IJ Swan-Ganz catheter tip overlies the level of the right main pulmonary artery. Left chest tube is in place. A nasogastric tube is in place tip off the film but P on the gastroesophageal junction.  The heart is enlarged. There is dense opacity at the left lung base, possibly representing atelectasis. No pneumothorax is identified. There are changes of mild edema.  IMPRESSION: 1. Endotracheal tube with tip just above carina or within the proximal right mainstem bronchus. Consider withdrawing tube approximately 2 cm. 2. Dense opacity at the left lung base, likely representing atelectasis. 3. Cardiomegaly and mild edema.  Electronically Signed: By: Shon Hale M.D. On: 10/29/2014 13:55   Dg Chest Portable 1 View  10/24/2014   CLINICAL DATA:  Medial on left-sided chest pressure since yesterday. Pain more severe tonight.  EXAM: PORTABLE CHEST - 1 VIEW  COMPARISON:  04/13/2008  FINDINGS: Shallow inspiration. The heart size and mediastinal contours are within normal limits. Both lungs are clear. The visualized skeletal structures are unremarkable.  IMPRESSION: No active disease.   Electronically Signed   By: Lucienne Capers M.D.   On: 10/24/2014 21:07

## 2014-11-20 NOTE — Patient Instructions (Signed)
Your physician recommends that you schedule a follow-up appointment in: 3 months  Your physician recommends that you continue on your current medications as directed. Please refer to the Current Medication list given to you today.  I HAVE REFILLED YOUR METOPROLOL, AMIODARONE, AND ATORVASTATIN   Thank you for choosing [facility]!!

## 2014-11-20 NOTE — Assessment & Plan Note (Addendum)
I have reviewed her medications and find that she is not on metoprolol 12.5 mg BID as prescribed on discharge. I will start this today as she is slightly hypertensive. May need to consider stopping amiodarone on next office visit as she was placed on this post-operatively. I will see her again in 6 weeks and probably stop the amiodarone then if she is not having any problems with irregular or rapid rhythm. She is due to see [facility] on Dec 12, 2014 and they may take her off of the amiodarone then.  She is advised to stay out of extreme cold < 40 degrees as winter approaches. She is encouraged to eat healthy and take care to keep immune system intact over the winter months. Take MVI and Vit C supplements in combination if possible. I have talked with her about post-operative depression which may occur and will be temporary. She is to report this to PCP or to [place] on follow up. A short course of Zoloft may be useful in this setting.

## 2014-11-29 ENCOUNTER — Encounter (HOSPITAL_COMMUNITY): Payer: Self-pay | Admitting: Cardiovascular Disease

## 2014-12-10 ENCOUNTER — Other Ambulatory Visit: Payer: Self-pay | Admitting: Cardiothoracic Surgery

## 2014-12-10 DIAGNOSIS — Z951 Presence of aortocoronary bypass graft: Secondary | ICD-10-CM

## 2014-12-11 ENCOUNTER — Ambulatory Visit
Admission: RE | Admit: 2014-12-11 | Discharge: 2014-12-11 | Disposition: A | Discharge status: Home / Self Care | Payer: No Typology Code available for payment source | Source: Ambulatory Visit | Attending: Cardiothoracic Surgery | Admitting: Cardiothoracic Surgery

## 2014-12-11 ENCOUNTER — Ambulatory Visit (INDEPENDENT_AMBULATORY_CARE_PROVIDER_SITE_OTHER): Payer: Self-pay | Admitting: Cardiothoracic Surgery

## 2014-12-11 ENCOUNTER — Encounter: Payer: Self-pay | Admitting: Cardiothoracic Surgery

## 2014-12-11 VITALS — BP 117/74 | HR 80 | Resp 20 | Ht 63.0 in | Wt 200.0 lb

## 2014-12-11 DIAGNOSIS — Z951 Presence of aortocoronary bypass graft: Secondary | ICD-10-CM

## 2014-12-11 NOTE — Progress Notes (Signed)
PCP is Leonides Grills, MD Referring Provider is Arnoldo Lenis, MD  Chief Complaint  Patient presents with  . Routine Post Op    F/U from surgery with CXR s/p CABG x 2 10/29/14    HPI: 6 weeks followup after multivessel CABG for unstable angina Patient doing well-no recurrent angina, surgical incisions healing well, chest x-ray clear. She is not resumed smoking. She is decided not to participate in cardiac rehabilitation. She will remain on amiodarone until February 1 per her cardiology office. She had postop atrial fibrillation but is currently sinus rhythm. 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 Hypertension   . Back pain   . Arthritis   . Hyperlipidemia   . CAD (coronary artery disease), native coronary artery-70% LM stenosis 10/25/2014  . DM type 2 (diabetes mellitus, type 2) 10/25/14  . Gout     Past Surgical History  Procedure Laterality Date  . Tonsillectomy    . Bladder tack    . Total hip arthroplasty    . Hernia repair    . Cholecystectomy    . Abdominal hysterectomy    . Appendectomy    . Cardiac catheterization  10/25/14    70% LM stensis, normal EF  . Coronary artery bypass graft N/A 10/29/2014    Procedure: CORONARY ARTERY BYPASS GRAFTING (CABG) TIMES TWO USING LEFT INTERNAL MAMMARY ARTERY AND RIGHT LEG GREATER SAPHENOUS VEIN HARVESTED ENDOSCOPICALLY.;  Surgeon: Ivin Poot, MD;  Location: [facility];  Service: Open Heart Surgery;  Laterality: N/A;  . Intraoperative transesophageal echocardiogram N/A 10/29/2014    Procedure: INTRAOPERATIVE TRANSESOPHAGEAL ECHOCARDIOGRAM;  Surgeon: Ivin Poot, MD;  Location: [facility];  Service: Open Heart Surgery;  Laterality: N/A;  . Left heart catheterization with coronary angiogram N/A 10/26/2014    Procedure: LEFT HEART CATHETERIZATION WITH CORONARY ANGIOGRAM;  Surgeon: Troy Sine, MD;  Location: [facility] CATH LAB;  Service:  Cardiovascular;  Laterality: N/A;    Family History  Problem Relation Age of Onset  . Hypertension Mother   . Hypertension Father   . Coronary artery disease Mother     CABG  . CAD Father     CABG  . Coronary artery disease Brother     Stent    Social History History  Substance Use Topics  . Smoking status: Never Smoker   . Smokeless tobacco: Never Used  . Alcohol Use: No    Current Outpatient Prescriptions  Medication Sig Dispense Refill  . ALPRAZolam (XANAX) 0.5 MG tablet Take 0.5 mg by mouth daily as needed. Panic attacks/anxiety.    Marland Kitchen amiodarone (PACERONE) 200 MG tablet Take 1 tablet (200 mg total) by mouth daily. 90 tablet 3  . aspirin EC 325 MG EC tablet Take 1 tablet (325 mg total) by mouth daily. 30 tablet 0  . atorvastatin (LIPITOR) 80 MG tablet Take 1 tablet (80 mg total) by mouth daily at 6 PM. 90 tablet 3  . metoprolol tartrate (LOPRESSOR) 25 MG tablet Take 0.5 tablets (12.5 mg total) by mouth 2 (two) times daily. 60 tablet 3   No current facility-administered medications for this visit.    No Known Allergies  Review of Systemsoverall strength and energy are improved She can resume driving.  BP 117/74 mmHg  Pulse 80  Resp 20  Ht 5\' 3"  (1.6 m)  Wt 200 lb (90.719 kg)  BMI 35.44 kg/m2  SpO2  98% Physical Exam Alert and comfortable Lungs clear Heart rate regular without murmur No pedal edema  Diagnostic Tests: Chest x-ray clear, sternal wires intact  Impression: Doing well 6 weeks postop urgent CABG for severe left main stenosis  Plan:continue current medications. Continue smoking cessation. Return as needed. Stop amiodarone after 3 months of therapy.

## 2014-12-12 ENCOUNTER — Ambulatory Visit: Payer: No Typology Code available for payment source | Admitting: Cardiothoracic Surgery

## 2015-01-14 ENCOUNTER — Encounter: Payer: Self-pay | Admitting: *Deleted

## 2015-01-14 ENCOUNTER — Ambulatory Visit (INDEPENDENT_AMBULATORY_CARE_PROVIDER_SITE_OTHER): Payer: No Typology Code available for payment source | Admitting: Adult Health

## 2015-01-14 ENCOUNTER — Encounter: Payer: Self-pay | Admitting: Adult Health

## 2015-01-14 VITALS — BP 139/70 | HR 57 | Ht 63.0 in | Wt 206.0 lb

## 2015-01-14 DIAGNOSIS — E7800 Pure hypercholesterolemia, unspecified: Secondary | ICD-10-CM

## 2015-01-14 DIAGNOSIS — I10 Essential (primary) hypertension: Secondary | ICD-10-CM

## 2015-01-14 NOTE — Patient Instructions (Addendum)
Your physician wants you to follow-up in: 6 weeks with Jory Sims, NP.  You will receive a reminder letter in the mail two months in advance. If you don't receive a letter, please call our office to schedule the follow-up appointment.  Stop Amiodarone  Thank you for choosing [facility]!

## 2015-01-14 NOTE — Assessment & Plan Note (Signed)
Counseled on low cholesterol diet and to increase exercise. Continue lovastatin. Will recheck status on next office visit.

## 2015-01-14 NOTE — Progress Notes (Signed)
HPI: Karina Green is a 59 year-old female patient of Dr. Harl Bowie that we follow for ongoing assessment and management of coronary artery disease, status post coronary artery bypass grafting in November of 2015.  She was last in the office in November 2015 posthospitalization and was doing well with the exception of some gout complaints.  The patient was not found to be on her beta blocker, and this was prescribed during that office visit.  She is here for followup for ongoing management and response to medication regimen.    She has been having dizzy spells for the last couple of weeks. The gout symptoms have been relieved. She continues to have periods of fatigue but this is coming further a part. No chest pain.  No Known Allergies  Current Outpatient Prescriptions  Medication Sig Dispense Refill  . ALPRAZolam (XANAX) 0.5 MG tablet Take 0.5 mg by mouth daily as needed. Panic attacks/anxiety.    Marland Kitchen amiodarone (PACERONE) 200 MG tablet Take 1 tablet (200 mg total) by mouth daily. 90 tablet 3  . aspirin EC 325 MG EC tablet Take 1 tablet (325 mg total) by mouth daily. 30 tablet 0  . atorvastatin (LIPITOR) 80 MG tablet Take 1 tablet (80 mg total) by mouth daily at 6 PM. 90 tablet 3  . metoprolol tartrate (LOPRESSOR) 25 MG tablet Take 0.5 tablets (12.5 mg total) by mouth 2 (two) times daily. 60 tablet 3   No current facility-administered medications for this visit.    Past Medical History  Diagnosis Date  . Hypertension   . Back pain   . Arthritis   . Hyperlipidemia   . CAD (coronary artery disease), native coronary artery-70% LM stenosis 10/25/2014  . DM type 2 (diabetes mellitus, type 2) 10/25/14  . Gout     Past Surgical History  Procedure Laterality Date  . Tonsillectomy    . Bladder tack    . Total hip arthroplasty    . Hernia repair    . Cholecystectomy    . Abdominal hysterectomy    . Appendectomy    . Cardiac catheterization  10/25/14    70% LM stensis, normal EF  . Coronary  artery bypass graft N/A 10/29/2014    Procedure: CORONARY ARTERY BYPASS GRAFTING (CABG) TIMES TWO USING LEFT INTERNAL MAMMARY ARTERY AND RIGHT LEG GREATER SAPHENOUS VEIN HARVESTED ENDOSCOPICALLY.;  Surgeon: Ivin Poot, MD;  Location: [facility];  Service: Open Heart Surgery;  Laterality: N/A;  . Intraoperative transesophageal echocardiogram N/A 10/29/2014    Procedure: INTRAOPERATIVE TRANSESOPHAGEAL ECHOCARDIOGRAM;  Surgeon: Ivin Poot, MD;  Location: [facility];  Service: Open Heart Surgery;  Laterality: N/A;  . Left heart catheterization with coronary angiogram N/A 10/26/2014    Procedure: LEFT HEART CATHETERIZATION WITH CORONARY ANGIOGRAM;  Surgeon: Troy Sine, MD;  Location: [facility] CATH LAB;  Service: Cardiovascular;  Laterality: N/A;    ROS: Complete review of systems performed and found to be negative unless outlined above  PHYSICAL EXAM BP 139/70 mmHg  Pulse 57  Ht 5\' 3"  (1.6 m)  Wt 206 lb (93.441 kg)  BMI 36.50 kg/m2  SpO2 98% General: Well developed, well nourished, in no acute distress Head: Eyes PERRLA, No xanthomas.   Normal cephalic and atramatic  Lungs: Clear bilaterally to auscultation and percussion. Heart: HRRR S1 S2, without MRG.  Pulses are 2+ & equal.            No carotid bruit. No JVD.  No abdominal bruits. No femoral bruits. Abdomen: Bowel  sounds are positive, abdomen soft and non-tender without masses or                  Hernia's noted. Msk:  Back normal, normal gait. Normal strength and tone for age.Well healed Sternotomy incision., Extremities: No clubbing, cyanosis or edema.  DP +1 Neuro: Alert and oriented X 3. Psych:  Good affect, responds appropriately  ASSESSMENT AND PLAN

## 2015-01-14 NOTE — Assessment & Plan Note (Signed)
Blood pressure is controlled. May consider adding low dose ACE on follow up if BP goes up after stopping amiodarone.

## 2015-01-14 NOTE — Progress Notes (Deleted)
Name: Karina Green    DOB: 1956/04/27  Age: 59 y.o.  MR#: [id number]       PCP:  Leonides Grills, MD      Insurance: Payor: [employer] MULTIPLAN / Plan: MULTIPLAN PHCS / Product Type: *No Product type* /   CC:    Chief Complaint  Patient presents with  . Coronary Artery Disease  . Hyperlipidemia    VS Filed Vitals:   01/14/15 1324  BP: 139/70  Pulse: 57  Height: 5\' 3"  (1.6 m)  Weight: 206 lb (93.441 kg)  SpO2: 98%    Weights Current Weight  01/14/15 206 lb (93.441 kg)  12/11/14 200 lb (90.719 kg)  11/20/14 204 lb (92.534 kg)    Blood Pressure  BP Readings from Last 3 Encounters:  01/14/15 139/70  12/11/14 117/74  11/20/14 158/88     Admit date:  (Not on file) Last encounter with RMR:  11/20/2014   Allergy Review of patient's allergies indicates no known allergies.  Current Outpatient Prescriptions  Medication Sig Dispense Refill  . ALPRAZolam (XANAX) 0.5 MG tablet Take 0.5 mg by mouth daily as needed. Panic attacks/anxiety.    Marland Kitchen amiodarone (PACERONE) 200 MG tablet Take 1 tablet (200 mg total) by mouth daily. 90 tablet 3  . aspirin EC 325 MG EC tablet Take 1 tablet (325 mg total) by mouth daily. 30 tablet 0  . atorvastatin (LIPITOR) 80 MG tablet Take 1 tablet (80 mg total) by mouth daily at 6 PM. 90 tablet 3  . metoprolol tartrate (LOPRESSOR) 25 MG tablet Take 0.5 tablets (12.5 mg total) by mouth 2 (two) times daily. 60 tablet 3   No current facility-administered medications for this visit.    Discontinued Meds:   There are no discontinued medications.  Patient Active Problem List   Diagnosis Date Noted  . CAD (coronary artery disease) 10/29/2014  . CAD (coronary artery disease), native coronary artery-70% LM stenosis 10/26/2014  . DM2 (diabetes mellitus, type 2) 10/26/2014  . Unstable angina 10/25/2014  . Chest pain 10/24/2014  . Essential hypertension 10/24/2014  . Hypercholesteremia 10/24/2014    LABS    Component Value Date/Time   NA 136* 11/04/2014  0357   NA 140 11/02/2014 0353   NA 138 11/01/2014 0301   K 3.2* 11/04/2014 0357   K 3.9 11/02/2014 0353   K 3.4* 11/01/2014 0301   CL 92* 11/04/2014 0357   CL 96 11/02/2014 0353   CL 97 11/01/2014 0301   CO2 29 11/04/2014 0357   CO2 28 11/02/2014 0353   CO2 25 11/01/2014 0301   GLUCOSE 120* 11/04/2014 0357   GLUCOSE 103* 11/02/2014 0353   GLUCOSE 116* 11/01/2014 0301   BUN 28* 11/04/2014 0357   BUN 26* 11/02/2014 0353   BUN 22 11/01/2014 0301   CREATININE 1.32* 11/04/2014 0357   CREATININE 1.27* 11/02/2014 0353   CREATININE 1.34* 11/01/2014 0301   CALCIUM 9.1 11/04/2014 0357   CALCIUM 8.9 11/02/2014 0353   CALCIUM 8.8 11/01/2014 0301   GFRNONAA 43* 11/04/2014 0357   GFRNONAA 45* 11/02/2014 0353   GFRNONAA 42* 11/01/2014 0301   GFRAA 50* 11/04/2014 0357   GFRAA 52* 11/02/2014 0353   GFRAA 49* 11/01/2014 0301   CMP     Component Value Date/Time   NA 136* 11/04/2014 0357   K 3.2* 11/04/2014 0357   CL 92* 11/04/2014 0357   CO2 29 11/04/2014 0357   GLUCOSE 120* 11/04/2014 0357   BUN 28* 11/04/2014 0357   CREATININE 1.32*  11/04/2014 0357   CALCIUM 9.1 11/04/2014 0357   PROT 7.8 10/24/2014 2037   ALBUMIN 3.6 10/24/2014 2037   AST 16 10/24/2014 2037   ALT 17 10/24/2014 2037   ALKPHOS 74 10/24/2014 2037   BILITOT 0.2* 10/24/2014 2037   GFRNONAA 43* 11/04/2014 0357   GFRAA 50* 11/04/2014 0357       Component Value Date/Time   WBC 12.6* 11/02/2014 0353   WBC 16.2* 11/01/2014 0301   WBC 15.3* 10/31/2014 0451   HGB 8.8* 11/02/2014 0353   HGB 9.2* 11/01/2014 0301   HGB 8.6* 10/31/2014 0451   HCT 27.9* 11/02/2014 0353   HCT 28.8* 11/01/2014 0301   HCT 26.4* 10/31/2014 0451   MCV 84.8 11/02/2014 0353   MCV 85.0 11/01/2014 0301   MCV 84.6 10/31/2014 0451    Lipid Panel     Component Value Date/Time   CHOL 177 10/25/2014 0407   TRIG 235* 10/25/2014 0407   HDL 30* 10/25/2014 0407   CHOLHDL 5.9 10/25/2014 0407   VLDL 47* 10/25/2014 0407   LDLCALC 100* 10/25/2014  0407    ABG    Component Value Date/Time   PHART 7.365 10/29/2014 2020   PCO2ART 38.6 10/29/2014 2020   PO2ART 83.0 10/29/2014 2020   HCO3 22.2 10/29/2014 2020   TCO2 21 10/30/2014 1610   ACIDBASEDEF 3.0* 10/29/2014 2020   O2SAT 96.0 10/29/2014 2020     No results found for: TSH BNP (last 3 results) No results for input(s): PROBNP in the last 8760 hours. Cardiac Panel (last 3 results) No results for input(s): CKTOTAL, CKMB, TROPONINI, RELINDX in the last 72 hours.  Iron/TIBC/Ferritin/ %Sat    Component Value Date/Time   IRON 70 10/26/2014 1052   TIBC 253 10/26/2014 1052   FERRITIN 73 10/26/2014 1052   IRONPCTSAT 28 10/26/2014 1052     EKG Orders placed or performed during the hospital encounter of 10/24/14  . EKG 12-Lead  . EKG 12-Lead  . EKG 12-Lead  . EKG 12-Lead  . EKG 12-Lead  . EKG 12-Lead  . EKG 12-Lead  . EKG 12-Lead  . EKG 12-Lead  . EKG 12-Lead  . EKG 12-Lead  . EKG 12-Lead  . EKG 12-Lead  . EKG 12-Lead     Prior Assessment and Plan Problem List as of 01/14/2015      Cardiovascular and Mediastinum   Essential hypertension   Last Assessment & Plan 11/20/2014 Office Visit Written 11/20/2014  3:02 PM by Lendon Colonel, NP    Slightly elevated currently but she is not taking the BB as ordered, as she was given Rx for this on discharge, per patient. She will begin the metoprolol as above. Followup appt in 6 weeks.       Unstable angina   CAD (coronary artery disease), native coronary artery-70% LM stenosis   Last Assessment & Plan 11/20/2014 Office Visit Edited 11/20/2014  3:01 PM by Lendon Colonel, NP    I have reviewed her medications and find that she is not on metoprolol 12.5 mg BID as prescribed on discharge. I will start this today as she is slightly hypertensive. May need to consider stopping amiodarone on next office visit as she was placed on this post-operatively. I will see her again in 6 weeks and probably stop the amiodarone then if she  is not having any problems with irregular or rapid rhythm. She is due to see [facility] on Dec 12, 2014 and they may take her off of the amiodarone then.  She is advised to stay out of extreme cold < 40 degrees as winter approaches. She is encouraged to eat healthy and take care to keep immune system intact over the winter months. Take MVI and Vit C supplements in combination if possible. I have talked with her about post-operative depression which may occur and will be temporary. She is to report this to PCP or to [place] on follow up. A short course of Zoloft may be useful in this setting.      CAD (coronary artery disease)     Endocrine   DM2 (diabetes mellitus, type 2)     Other   Chest pain   Hypercholesteremia   Last Assessment & Plan 11/20/2014 Office Visit Written 11/20/2014  3:02 PM by Lendon Colonel, NP    Continue statin therapy. Will recheck status in 3 months.           Imaging: No results found.

## 2015-01-14 NOTE — Assessment & Plan Note (Signed)
I will stop the amiodarone now as she is no longer in pain from gout and is having some dizziness. This may be related to lower HR on amiodarone. Continue metoprolol as directed. Will give her information for HR at work to allow her to be out for a total of 3 months with date of Feb 11 th as 3 month mark. She will be seen shortly after that to be released back to work.

## 2015-02-22 ENCOUNTER — Encounter: Payer: Self-pay | Admitting: Adult Health

## 2015-02-22 ENCOUNTER — Ambulatory Visit (INDEPENDENT_AMBULATORY_CARE_PROVIDER_SITE_OTHER): Payer: No Typology Code available for payment source | Admitting: Adult Health

## 2015-02-22 ENCOUNTER — Encounter: Payer: Self-pay | Admitting: *Deleted

## 2015-02-22 VITALS — BP 138/74 | HR 61 | Ht 63.0 in | Wt 214.0 lb

## 2015-02-22 DIAGNOSIS — I2510 Atherosclerotic heart disease of native coronary artery without angina pectoris: Secondary | ICD-10-CM

## 2015-02-22 DIAGNOSIS — I10 Essential (primary) hypertension: Secondary | ICD-10-CM

## 2015-02-22 NOTE — Progress Notes (Deleted)
Name: Karina Green    DOB: 1956-12-18  Age: 59 y.o.  MR#: [id number]       PCP:  Leonides Grills, MD      Insurance: Payor: [employer] MULTIPLAN / Plan: MULTIPLAN PHCS / Product Type: *No Product type* /   CC:    Chief Complaint  Patient presents with  . Coronary Artery Disease    Status post CABG 2015  . Hyperlipidemia  . Hypertension    VS Filed Vitals:   02/22/15 1336  BP: 138/74  Pulse: 61  Height: 5\' 3"  (1.6 m)  Weight: 214 lb (97.07 kg)  SpO2: 97%    Weights Current Weight  02/22/15 214 lb (97.07 kg)  01/14/15 206 lb (93.441 kg)  12/11/14 200 lb (90.719 kg)    Blood Pressure  BP Readings from Last 3 Encounters:  02/22/15 138/74  01/14/15 139/70  12/11/14 117/74     Admit date:  (Not on file) Last encounter with RMR:  01/14/2015   Allergy Review of patient's allergies indicates no known allergies.  Current Outpatient Prescriptions  Medication Sig Dispense Refill  . ALPRAZolam (XANAX) 0.5 MG tablet Take 0.5 mg by mouth daily as needed. Panic attacks/anxiety.    Marland Kitchen aspirin EC 325 MG EC tablet Take 1 tablet (325 mg total) by mouth daily. 30 tablet 0  . atorvastatin (LIPITOR) 80 MG tablet Take 1 tablet (80 mg total) by mouth daily at 6 PM. 90 tablet 3  . metoprolol tartrate (LOPRESSOR) 25 MG tablet Take 0.5 tablets (12.5 mg total) by mouth 2 (two) times daily. 60 tablet 3   No current facility-administered medications for this visit.    Discontinued Meds:   There are no discontinued medications.  Patient Active Problem List   Diagnosis Date Noted  . CAD (coronary artery disease), native coronary artery-70% LM stenosis 10/26/2014  . DM2 (diabetes mellitus, type 2) 10/26/2014  . Unstable angina 10/25/2014  . Essential hypertension 10/24/2014  . Hypercholesteremia 10/24/2014    LABS    Component Value Date/Time   NA 136* 11/04/2014 0357   NA 140 11/02/2014 0353   NA 138 11/01/2014 0301   K 3.2* 11/04/2014 0357   K 3.9 11/02/2014 0353   K 3.4* 11/01/2014  0301   CL 92* 11/04/2014 0357   CL 96 11/02/2014 0353   CL 97 11/01/2014 0301   CO2 29 11/04/2014 0357   CO2 28 11/02/2014 0353   CO2 25 11/01/2014 0301   GLUCOSE 120* 11/04/2014 0357   GLUCOSE 103* 11/02/2014 0353   GLUCOSE 116* 11/01/2014 0301   BUN 28* 11/04/2014 0357   BUN 26* 11/02/2014 0353   BUN 22 11/01/2014 0301   CREATININE 1.32* 11/04/2014 0357   CREATININE 1.27* 11/02/2014 0353   CREATININE 1.34* 11/01/2014 0301   CALCIUM 9.1 11/04/2014 0357   CALCIUM 8.9 11/02/2014 0353   CALCIUM 8.8 11/01/2014 0301   GFRNONAA 43* 11/04/2014 0357   GFRNONAA 45* 11/02/2014 0353   GFRNONAA 42* 11/01/2014 0301   GFRAA 50* 11/04/2014 0357   GFRAA 52* 11/02/2014 0353   GFRAA 49* 11/01/2014 0301   CMP     Component Value Date/Time   NA 136* 11/04/2014 0357   K 3.2* 11/04/2014 0357   CL 92* 11/04/2014 0357   CO2 29 11/04/2014 0357   GLUCOSE 120* 11/04/2014 0357   BUN 28* 11/04/2014 0357   CREATININE 1.32* 11/04/2014 0357   CALCIUM 9.1 11/04/2014 0357   PROT 7.8 10/24/2014 2037   ALBUMIN 3.6 10/24/2014 2037  AST 16 10/24/2014 2037   ALT 17 10/24/2014 2037   ALKPHOS 74 10/24/2014 2037   BILITOT 0.2* 10/24/2014 2037   GFRNONAA 43* 11/04/2014 0357   GFRAA 50* 11/04/2014 0357       Component Value Date/Time   WBC 12.6* 11/02/2014 0353   WBC 16.2* 11/01/2014 0301   WBC 15.3* 10/31/2014 0451   HGB 8.8* 11/02/2014 0353   HGB 9.2* 11/01/2014 0301   HGB 8.6* 10/31/2014 0451   HCT 27.9* 11/02/2014 0353   HCT 28.8* 11/01/2014 0301   HCT 26.4* 10/31/2014 0451   MCV 84.8 11/02/2014 0353   MCV 85.0 11/01/2014 0301   MCV 84.6 10/31/2014 0451    Lipid Panel     Component Value Date/Time   CHOL 177 10/25/2014 0407   TRIG 235* 10/25/2014 0407   HDL 30* 10/25/2014 0407   CHOLHDL 5.9 10/25/2014 0407   VLDL 47* 10/25/2014 0407   LDLCALC 100* 10/25/2014 0407    ABG    Component Value Date/Time   PHART 7.365 10/29/2014 2020   PCO2ART 38.6 10/29/2014 2020   PO2ART 83.0  10/29/2014 2020   HCO3 22.2 10/29/2014 2020   TCO2 21 10/30/2014 1610   ACIDBASEDEF 3.0* 10/29/2014 2020   O2SAT 96.0 10/29/2014 2020     No results found for: TSH BNP (last 3 results) No results for input(s): BNP in the last 8760 hours.  ProBNP (last 3 results) No results for input(s): PROBNP in the last 8760 hours.  Cardiac Panel (last 3 results) No results for input(s): CKTOTAL, CKMB, TROPONINI, RELINDX in the last 72 hours.  Iron/TIBC/Ferritin/ %Sat    Component Value Date/Time   IRON 70 10/26/2014 1052   TIBC 253 10/26/2014 1052   FERRITIN 73 10/26/2014 1052   IRONPCTSAT 28 10/26/2014 1052     EKG Orders placed or performed during the hospital encounter of 10/24/14  . EKG 12-Lead  . EKG 12-Lead  . EKG 12-Lead  . EKG 12-Lead  . EKG 12-Lead  . EKG 12-Lead  . EKG 12-Lead  . EKG 12-Lead  . EKG 12-Lead  . EKG 12-Lead  . EKG 12-Lead  . EKG 12-Lead  . EKG 12-Lead  . EKG 12-Lead     Prior Assessment and Plan Problem List as of 02/22/2015      Cardiovascular and Mediastinum   Essential hypertension   Last Assessment & Plan 01/14/2015 Office Visit Written 01/14/2015  1:49 PM by Lendon Colonel, NP    Blood pressure is controlled. May consider adding low dose ACE on follow up if BP goes up after stopping amiodarone.       Unstable angina   CAD (coronary artery disease), native coronary artery-70% LM stenosis   Last Assessment & Plan 01/14/2015 Office Visit Written 01/14/2015  1:48 PM by Lendon Colonel, NP    I will stop the amiodarone now as she is no longer in pain from gout and is having some dizziness. This may be related to lower HR on amiodarone. Continue metoprolol as directed. Will give her information for HR at work to allow her to be out for a total of 3 months with date of Feb 11 th as 3 month mark. She will be seen shortly after that to be released back to work.         Endocrine   DM2 (diabetes mellitus, type 2)     Other   Hypercholesteremia    Last Assessment & Plan 01/14/2015 Office Visit Written 01/14/2015  1:50 PM  by Lendon Colonel, NP    Counseled on low cholesterol diet and to increase exercise. Continue lovastatin. Will recheck status on next office visit.           Imaging: No results found.

## 2015-02-22 NOTE — Patient Instructions (Signed)
Your physician wants you to follow-up in: 6 months with Jory Sims, NP.  You will receive a reminder letter in the mail two months in advance. If you don't receive a letter, please call our office to schedule the follow-up appointment.  Your physician recommends that you continue on your current medications as directed. Please refer to the Current Medication list given to you today.  You have been given a note for work.  Thank you for choosing [facility]!

## 2015-02-22 NOTE — Progress Notes (Signed)
Cardiology Office Note   Date:  02/22/2015   ID:  Camala, Talwar 23-Jun-1956, MRN [id number]  PCP:  Leonides Grills, MD  Cardiologist:  Cloria Spring, NP   Chief Complaint  Patient presents with  . Coronary Artery Disease    Status post CABG 2015  . Hyperlipidemia  . Hypertension      History of Present Illness: Karina Green is a 59 y.o. female who presents for ongoing assessment and management of CAD, status post coronary artery bypass grafting in November 2015, other history includes hypertension, hypercholesterolemia.  She was last seen in the office in January of 2016, and had complained of some dizzy spells.  Amiodarone was stopped, which was started postoperatively for atrial fibrillation.  Consideration for addition of ACE inhibitor will be made on this office visit.  She is feeling much better, off of the amiodarone.  She has a treadmill that she walks on 15 minutes again in the morning and 15 minutes today in the afternoon.  She has no recurrent chest pain.  She does admit to having days when she has no energy, but did pass quickly.  She is concerned about going to work as she does a lot of heavy lifting and pulling.  She does state that she sometimes feels her sternum, moving, when she is walking, but does not have pain.    Past Medical History  Diagnosis Date  . Hypertension   . Back pain   . Arthritis   . Hyperlipidemia   . CAD (coronary artery disease), native coronary artery-70% LM stenosis 10/25/2014  . DM type 2 (diabetes mellitus, type 2) 10/25/14  . Gout     Past Surgical History  Procedure Laterality Date  . Tonsillectomy    . Bladder tack    . Total hip arthroplasty    . Hernia repair    . Cholecystectomy    . Abdominal hysterectomy    . Appendectomy    . Cardiac catheterization  10/25/14    70% LM stensis, normal EF  . Coronary artery bypass graft N/A 10/29/2014    Procedure: CORONARY ARTERY BYPASS GRAFTING (CABG) TIMES TWO USING LEFT  INTERNAL MAMMARY ARTERY AND RIGHT LEG GREATER SAPHENOUS VEIN HARVESTED ENDOSCOPICALLY.;  Surgeon: Ivin Poot, MD;  Location: [facility];  Service: Open Heart Surgery;  Laterality: N/A;  . Intraoperative transesophageal echocardiogram N/A 10/29/2014    Procedure: INTRAOPERATIVE TRANSESOPHAGEAL ECHOCARDIOGRAM;  Surgeon: Ivin Poot, MD;  Location: [facility];  Service: Open Heart Surgery;  Laterality: N/A;  . Left heart catheterization with coronary angiogram N/A 10/26/2014    Procedure: LEFT HEART CATHETERIZATION WITH CORONARY ANGIOGRAM;  Surgeon: Troy Sine, MD;  Location: [facility] CATH LAB;  Service: Cardiovascular;  Laterality: N/A;     Current Outpatient Prescriptions  Medication Sig Dispense Refill  . ALPRAZolam (XANAX) 0.5 MG tablet Take 0.5 mg by mouth daily as needed. Panic attacks/anxiety.    Marland Kitchen aspirin EC 325 MG EC tablet Take 1 tablet (325 mg total) by mouth daily. 30 tablet 0  . atorvastatin (LIPITOR) 80 MG tablet Take 1 tablet (80 mg total) by mouth daily at 6 PM. 90 tablet 3  . metoprolol tartrate (LOPRESSOR) 25 MG tablet Take 0.5 tablets (12.5 mg total) by mouth 2 (two) times daily. 60 tablet 3   No current facility-administered medications for this visit.    Allergies:   Review of patient's allergies indicates no known allergies.    Social History:  The patient  reports  that she has never smoked. She has never used smokeless tobacco. She reports that she does not drink alcohol or use illicit drugs.   Family History:  The patient's *family history includes CAD in her father; Coronary artery disease in her brother and mother; Hypertension in her father and mother.    ROS: .   All other systems are reviewed and negative.Unless otherwise mentioned in H&P above.   PHYSICAL EXAM: VS:  There were no vitals taken for this visit. , BMI There is no weight on file to calculate BMI. GEN: Well nourished, well developed, in no acute distress HEENT: normal Neck: no JVD, carotid bruits, or  masses Cardiac: RRR; no murmurs, rubs, or gallops,no edema  Respiratory:  clear to auscultation bilaterally, normal work of breathing GI: soft, nontender, nondistended, + BS MS: no deformity or atrophy Skin: warm and dry, no rash Neuro:  Strength and sensation are intact Psych: euthymic mood, full affect   Recent Labs: 10/24/2014: ALT 17 10/30/2014: Magnesium 2.0 11/02/2014: Hemoglobin 8.8*; Platelets 226 11/04/2014: BUN 28*; Creatinine 1.32*; Potassium 3.2*; Sodium 136*    Lipid Panel    Component Value Date/Time   CHOL 177 10/25/2014 0407   TRIG 235* 10/25/2014 0407   HDL 30* 10/25/2014 0407   CHOLHDL 5.9 10/25/2014 0407   VLDL 47* 10/25/2014 0407   LDLCALC 100* 10/25/2014 0407      Wt Readings from Last 3 Encounters:  01/14/15 206 lb (93.441 kg)  12/11/14 200 lb (90.719 kg)  11/20/14 204 lb (92.534 kg)       ASSESSMENT AND PLAN:  1. CAD: Status post coronary artery bypass grafting November 2015.  She is feeling well and regaining some of her energy.  She is walking on a treadmill at home 15 minutes.  The morning and 15 minutes in the evening.  She continues to have some discomfort in her chest, and can feel her sternum moving  sometimes.  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  I have advised her that she should not be doing that for a minimum of 6 months.  She should not be lifting anything greater than 25 pounds.  I have written a letter affect, says that she can return her to her employer.  If she can do light duty, i.e., answering phones, doing computer work, or any other work that does not require lifting or pulling, she can return.  2. Hypertension: Blood pressure is well-controlled.  We will not place her on an ACE inhibitor at this time as blood pressure is low normal.  She will continue aspirin, statin, and metoprolol.  We will see her again in 6 months unless she becomes symptomatic.    Current medicines  are reviewed at length with the patient today.  The patient has no concerns regarding medicines.\  No orders of the defined types were placed in this encounter.     Disposition:   FU with 6 months unless symptomatic Signed, Jory Sims, NP  02/22/2015 7:08 AM    [facility] [address], [place], [place] [address] Phone: [phone]; Fax: [phone]

## 2015-02-27 ENCOUNTER — Telehealth: Payer: Self-pay | Admitting: Adult Health

## 2015-02-27 NOTE — Telephone Encounter (Signed)
Will route to provider for details

## 2015-02-27 NOTE — Telephone Encounter (Signed)
Needs doctor's note stating that she can not go back to work yet.  Please fax to [phone] claim # [id number]. / tg

## 2015-02-28 NOTE — Telephone Encounter (Signed)
There is no option for light duty at her job so she needs to know and have letter stating how long she has to stay out

## 2015-02-28 NOTE — Telephone Encounter (Signed)
We gave her a letter stating she could go back to work light duty. Does she need something more?

## 2015-03-01 NOTE — Telephone Encounter (Signed)
Spoke with Dr. Domenic Polite. Patient can have letter to be out of work for 6 months as she is doing heavy lifting.

## 2015-03-01 NOTE — Telephone Encounter (Signed)
She can stay out for one month. No need to stay out any longer. 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 From a cardiac standpoint, she can return. Will need to have help with the person she cares for.

## 2015-03-05 ENCOUNTER — Encounter: Payer: Self-pay | Admitting: *Deleted

## 2015-03-07 NOTE — Telephone Encounter (Signed)
Letter completed and faxed.

## 2015-04-09 ENCOUNTER — Telehealth: Payer: Self-pay | Admitting: Adult Health

## 2015-04-09 NOTE — Telephone Encounter (Signed)
Will route to K.Purcell Nails NP

## 2015-04-09 NOTE — Telephone Encounter (Signed)
Patient states that she needs letter extending her FMLA /tg

## 2015-04-11 NOTE — Telephone Encounter (Signed)
Didn't we send a letter for 6 months. She will need to be seen before extending past 6 months. Have her see one of the cardiologists, as I have been seeing her for the last few times. Thanks

## 2015-04-12 NOTE — Telephone Encounter (Signed)
I will have Karina Green make an apt for her to see a cardiologist, pt aware

## 2015-04-19 ENCOUNTER — Ambulatory Visit: Payer: No Typology Code available for payment source | Admitting: Adult Health

## 2015-04-29 NOTE — Progress Notes (Signed)
Clinical Summary Ms. Lovick is a 59 y.o.female last seen by NP Purcell Nails, this is our first visit together. She is seen for the following medical problems.  1. CAD - hx of CABG Nov 2015 (LIMA-LAD,SVG-OM) - 10/2014 echo LVEF 60-65%, abnormal diastolic function indeterminant grade,  - walks on treadmill 10 min for 2-3 times a day at walking pace. Tolerates without troubles - denies any chest pain, though can get some discomfort at incision site.  2. HTN - does not check at home - compliant with meds  3. Hyperlipidemia 10/2014 TC 177 TG 235 HDL 30 LDL 100 - reports upcoming panel with pcp  4. Postop afib - now off amio, no recurrence of symptoms.   Past Medical History  Diagnosis Date  . Hypertension   . Back pain   . Arthritis   . Hyperlipidemia   . CAD (coronary artery disease), native coronary artery-70% LM stenosis 10/25/2014  . DM type 2 (diabetes mellitus, type 2) 10/25/14  . Gout      No Known Allergies   Current Outpatient Prescriptions  Medication Sig Dispense Refill  . ALPRAZolam (XANAX) 0.5 MG tablet Take 0.5 mg by mouth daily as needed. Panic attacks/anxiety.    Marland Kitchen aspirin EC 325 MG EC tablet Take 1 tablet (325 mg total) by mouth daily. 30 tablet 0  . atorvastatin (LIPITOR) 80 MG tablet Take 1 tablet (80 mg total) by mouth daily at 6 PM. 90 tablet 3  . metoprolol tartrate (LOPRESSOR) 25 MG tablet Take 0.5 tablets (12.5 mg total) by mouth 2 (two) times daily. 60 tablet 3   No current facility-administered medications for this visit.     Past Surgical History  Procedure Laterality Date  . Tonsillectomy    . Bladder tack    . Total hip arthroplasty    . Hernia repair    . Cholecystectomy    . Abdominal hysterectomy    . Appendectomy    . Cardiac catheterization  10/25/14    70% LM stensis, normal EF  . Coronary artery bypass graft N/A 10/29/2014    Procedure: CORONARY ARTERY BYPASS GRAFTING (CABG) TIMES TWO USING LEFT INTERNAL MAMMARY ARTERY AND RIGHT  LEG GREATER SAPHENOUS VEIN HARVESTED ENDOSCOPICALLY.;  Surgeon: Ivin Poot, MD;  Location: [place];  Service: Open Heart Surgery;  Laterality: N/A;  . Intraoperative transesophageal echocardiogram N/A 10/29/2014    Procedure: INTRAOPERATIVE TRANSESOPHAGEAL ECHOCARDIOGRAM;  Surgeon: Ivin Poot, MD;  Location: [place];  Service: Open Heart Surgery;  Laterality: N/A;  . Left heart catheterization with coronary angiogram N/A 10/26/2014    Procedure: LEFT HEART CATHETERIZATION WITH CORONARY ANGIOGRAM;  Surgeon: Troy Sine, MD;  Location: [facility] CATH LAB;  Service: Cardiovascular;  Laterality: N/A;     No Known Allergies    Family History  Problem Relation Age of Onset  . Hypertension Mother   . Hypertension Father   . Coronary artery disease Mother     CABG  . CAD Father     CABG  . Coronary artery disease Brother     Stent     Social History Ms. Pines reports that she has never smoked. She has never used smokeless tobacco. Ms. Satchell reports that she does not drink alcohol.   Review of Systems CONSTITUTIONAL: No weight loss, fever, chills, weakness or fatigue.  HEENT: Eyes: No visual loss, blurred vision, double vision or yellow sclerae.No hearing loss, sneezing, congestion, runny nose or sore throat.  SKIN: No rash or itching.  CARDIOVASCULAR: per HPI RESPIRATORY: No shortness of breath, cough or sputum.  GASTROINTESTINAL: No anorexia, nausea, vomiting or diarrhea. No abdominal pain or blood.  GENITOURINARY: No burning on urination, no polyuria NEUROLOGICAL: No headache, dizziness, syncope, paralysis, ataxia, numbness or tingling in the extremities. No change in bowel or bladder control.  MUSCULOSKELETAL: No muscle, back pain, joint pain or stiffness.  LYMPHATICS: No enlarged nodes. No history of splenectomy.  PSYCHIATRIC: No history of depression or anxiety.  ENDOCRINOLOGIC: No reports of sweating, cold or heat intolerance. No polyuria or polydipsia.  Marland Kitchen   Physical  Examination p 59 bp 132/70 Wt 223 lbs BMI 40 Gen: resting comfortably, no acute distress HEENT: no scleral icterus, pupils equal round and reactive, no palptable cervical adenopathy,  CV: RRR, no m/r/g, no JVD, no carotid bruits, no JVD Resp: Clear to auscultation bilaterally GI: abdomen is soft, non-tender, non-distended, normal bowel sounds, no hepatosplenomegaly MSK: extremities are warm, no edema.  Skin: warm, no rash Neuro:  no focal deficits Psych: appropriate affect   Diagnostic Studies 10/2014 Cath HEMODYNAMICS:   Central Aorta: 140/66  Left Ventricle: 140/18  ANGIOGRAPHY:   The left main coronary artery was a large-caliber vessel which bifurcated into the LAD and left circumflex coronary artery. There was a 70% distal left main stenosis prior to its bifurcation with a suggestion of a focal napkin ring like appearance. This did not improve following IC nitroglycerin administration.  The LAD was angiographically normal and gave rise to 2 major diagonal vessels and several septal perforating arteries. The vessel extended to the LV apex.   The left circumflex coronary artery was angiographically normal and gave rise to one major bifurcating obtuse marginal Sindee Stucker.   The RCA was angiographically normal it gave rise to a PDA and PLA vessel.   Left ventriculography revealed normal global LV contractility without focal segmental wall motion abnormalities. There was no evidence for mitral regurgitation. Ejection fraction was 60%.   Total contrast used: 85 cc Omnipaque  IMPRESSION:  Significant coronary obstructive disease with 70% distal left main stenosis.  Normal LV function.   RECOMMENDATION:  Surgical consultation for consideration for CABG.  10/2014 Echo Study Conclusions  - Left ventricle: The cavity size was normal. Wall thickness was increased in a pattern of mild LVH. Systolic function was normal. The estimated ejection fraction was in the range of  60% to 65%. Diastolic function is abnormal, indeterminant grade. Wall motion was normal; there were no regional wall motion abnormalities. - Aortic valve: Moderately calcified annulus. Trileaflet; mildly thickened leaflets. There is aortic sclerosis without significant stenosis. There was trivial regurgitation. Valve area (VTI): 1.79 cm^2. Valve area (Vmax): 1.82 cm^2. - Mitral valve: Mildly calcified annulus. Mildly thickened leaflets . - Atrial septum: No defect or patent foramen ovale was identified. - Technically adequate study.  10/2014 Carotid US Summary:  - The vertebral arteries appear patent with antegrade flow. - Findings consistent with 1- 39 percent stenosis involving the right internal carotid artery and the left internal carotid artery. - ICA/CCA ratio. right = 0.81. left =1.36. - Palpable pedal pulses.  Assessment and Plan  1. CAD - no current symptoms - continue risk factor modification and secondary prevention. Change ASA to 81mg  daily - ok to return to work from cardiac standpoint  2. HTN - at goal, continue current meds  3. HL - has upcoming labs with pcp, f/u results - continue current statin        Arnoldo Lenis, M.D.

## 2015-04-30 ENCOUNTER — Encounter: Payer: Self-pay | Admitting: Cardiology

## 2015-04-30 ENCOUNTER — Ambulatory Visit (INDEPENDENT_AMBULATORY_CARE_PROVIDER_SITE_OTHER): Payer: No Typology Code available for payment source | Admitting: Cardiology

## 2015-04-30 VITALS — BP 132/70 | HR 59 | Ht 63.0 in | Wt 223.0 lb

## 2015-04-30 DIAGNOSIS — E7800 Pure hypercholesterolemia, unspecified: Secondary | ICD-10-CM

## 2015-04-30 DIAGNOSIS — I2510 Atherosclerotic heart disease of native coronary artery without angina pectoris: Secondary | ICD-10-CM

## 2015-04-30 DIAGNOSIS — I10 Essential (primary) hypertension: Secondary | ICD-10-CM | POA: Diagnosis not present

## 2015-04-30 NOTE — Patient Instructions (Signed)
Your physician wants you to follow-up in: 1 year with Dr Harl Bowie- May 2017 You will receive a reminder letter in the mail two months in advance. If you don't receive a letter, please call our office to schedule the follow-up appointment.     Please decrease aspirin to 81 mg enteric coated daily      Thank you for choosing [facility] !

## 2015-06-17 ENCOUNTER — Emergency Department (HOSPITAL_COMMUNITY): Payer: Self-pay

## 2015-06-17 ENCOUNTER — Inpatient Hospital Stay (HOSPITAL_COMMUNITY)
Admission: EM | Admit: 2015-06-17 | Discharge: 2015-06-20 | DRG: 871 | Disposition: A | Discharge status: Home / Self Care | Payer: No Typology Code available for payment source | Attending: Internal Medicine | Admitting: Internal Medicine

## 2015-06-17 ENCOUNTER — Encounter (HOSPITAL_COMMUNITY): Payer: Self-pay | Admitting: *Deleted

## 2015-06-17 DIAGNOSIS — I2510 Atherosclerotic heart disease of native coronary artery without angina pectoris: Secondary | ICD-10-CM | POA: Diagnosis present

## 2015-06-17 DIAGNOSIS — M1990 Unspecified osteoarthritis, unspecified site: Secondary | ICD-10-CM | POA: Diagnosis present

## 2015-06-17 DIAGNOSIS — I350 Nonrheumatic aortic (valve) stenosis: Secondary | ICD-10-CM

## 2015-06-17 DIAGNOSIS — I10 Essential (primary) hypertension: Secondary | ICD-10-CM | POA: Diagnosis not present

## 2015-06-17 DIAGNOSIS — E1122 Type 2 diabetes mellitus with diabetic chronic kidney disease: Secondary | ICD-10-CM

## 2015-06-17 DIAGNOSIS — E876 Hypokalemia: Secondary | ICD-10-CM

## 2015-06-17 DIAGNOSIS — E785 Hyperlipidemia, unspecified: Secondary | ICD-10-CM | POA: Diagnosis present

## 2015-06-17 DIAGNOSIS — Z8249 Family history of ischemic heart disease and other diseases of the circulatory system: Secondary | ICD-10-CM

## 2015-06-17 DIAGNOSIS — Z951 Presence of aortocoronary bypass graft: Secondary | ICD-10-CM

## 2015-06-17 DIAGNOSIS — I5032 Chronic diastolic (congestive) heart failure: Secondary | ICD-10-CM

## 2015-06-17 DIAGNOSIS — R0902 Hypoxemia: Secondary | ICD-10-CM

## 2015-06-17 DIAGNOSIS — D649 Anemia, unspecified: Secondary | ICD-10-CM | POA: Diagnosis present

## 2015-06-17 DIAGNOSIS — Z96649 Presence of unspecified artificial hip joint: Secondary | ICD-10-CM | POA: Diagnosis present

## 2015-06-17 DIAGNOSIS — J189 Pneumonia, unspecified organism: Secondary | ICD-10-CM | POA: Diagnosis present

## 2015-06-17 DIAGNOSIS — I129 Hypertensive chronic kidney disease with stage 1 through stage 4 chronic kidney disease, or unspecified chronic kidney disease: Secondary | ICD-10-CM | POA: Diagnosis present

## 2015-06-17 DIAGNOSIS — N183 Chronic kidney disease, stage 3 unspecified: Secondary | ICD-10-CM

## 2015-06-17 DIAGNOSIS — I509 Heart failure, unspecified: Secondary | ICD-10-CM

## 2015-06-17 DIAGNOSIS — E119 Type 2 diabetes mellitus without complications: Secondary | ICD-10-CM

## 2015-06-17 DIAGNOSIS — A419 Sepsis, unspecified organism: Principal | ICD-10-CM

## 2015-06-17 DIAGNOSIS — J9601 Acute respiratory failure with hypoxia: Secondary | ICD-10-CM

## 2015-06-17 LAB — URINALYSIS, ROUTINE W REFLEX MICROSCOPIC
BILIRUBIN URINE: NEGATIVE
Glucose, UA: NEGATIVE mg/dL
Hgb urine dipstick: NEGATIVE
Ketones, ur: NEGATIVE mg/dL
NITRITE: NEGATIVE
PH: 5.5 (ref 5.0–8.0)
Protein, ur: NEGATIVE mg/dL
SPECIFIC GRAVITY, URINE: 1.025 (ref 1.005–1.030)
UROBILINOGEN UA: 0.2 mg/dL (ref 0.0–1.0)

## 2015-06-17 LAB — BASIC METABOLIC PANEL
Anion gap: 12 (ref 5–15)
BUN: 23 mg/dL — ABNORMAL HIGH (ref 6–20)
CO2: 21 mmol/L — ABNORMAL LOW (ref 22–32)
Calcium: 8.6 mg/dL — ABNORMAL LOW (ref 8.9–10.3)
Chloride: 108 mmol/L (ref 101–111)
Creatinine, Ser: 1.23 mg/dL — ABNORMAL HIGH (ref 0.44–1.00)
GFR calc Af Amer: 54 mL/min — ABNORMAL LOW (ref >60)
GFR calc non Af Amer: 46 mL/min — ABNORMAL LOW (ref >60)
Glucose, Bld: 126 mg/dL — ABNORMAL HIGH (ref 65–99)
Potassium: 3.2 mmol/L — ABNORMAL LOW (ref 3.5–5.1)
Sodium: 141 mmol/L (ref 135–145)

## 2015-06-17 LAB — CBC WITH DIFFERENTIAL/PLATELET
Basophils Absolute: 0 10*3/uL (ref 0.0–0.1)
Basophils Relative: 0 % (ref 0–1)
EOS ABS: 0.1 10*3/uL (ref 0.0–0.7)
Eosinophils Relative: 1 % (ref 0–5)
HEMATOCRIT: 36.9 % (ref 36.0–46.0)
Hemoglobin: 11.7 g/dL — ABNORMAL LOW (ref 12.0–15.0)
LYMPHS PCT: 5 % — AB (ref 12–46)
Lymphs Abs: 0.9 10*3/uL (ref 0.7–4.0)
MCH: 25.8 pg — ABNORMAL LOW (ref 26.0–34.0)
MCHC: 31.7 g/dL (ref 30.0–36.0)
MCV: 81.5 fL (ref 78.0–100.0)
Monocytes Absolute: 0.4 10*3/uL (ref 0.1–1.0)
Monocytes Relative: 2 % — ABNORMAL LOW (ref 3–12)
Neutro Abs: 18 10*3/uL — ABNORMAL HIGH (ref 1.7–7.7)
Neutrophils Relative %: 92 % — ABNORMAL HIGH (ref 43–77)
Platelets: 293 10*3/uL (ref 150–400)
RBC: 4.53 MIL/uL (ref 3.87–5.11)
RDW: 16.6 % — AB (ref 11.5–15.5)
WBC: 19.3 10*3/uL — ABNORMAL HIGH (ref 4.0–10.5)

## 2015-06-17 LAB — URINE MICROSCOPIC-ADD ON

## 2015-06-17 LAB — LACTIC ACID, PLASMA
LACTIC ACID, VENOUS: 2 mmol/L (ref 0.5–2.0)
Lactic Acid, Venous: 2.5 mmol/L (ref 0.5–2.0)

## 2015-06-17 LAB — BRAIN NATRIURETIC PEPTIDE: B Natriuretic Peptide: 54 pg/mL (ref 0.0–100.0)

## 2015-06-17 LAB — D-DIMER, QUANTITATIVE (NOT AT ARMC): D DIMER QUANT: 1.32 ug{FEU}/mL — AB (ref 0.00–0.48)

## 2015-06-17 LAB — TROPONIN I: Troponin I: <0.03 ng/mL (ref <0.031)

## 2015-06-17 MED ORDER — LEVOFLOXACIN IN D5W 750 MG/150ML IV SOLN
750.0000 mg | Freq: Once | INTRAVENOUS | Status: AC
  Administered 2015-06-17: 750 mg via INTRAVENOUS
  Filled 2015-06-17: qty 150

## 2015-06-17 MED ORDER — IOHEXOL 350 MG/ML SOLN
100.0000 mL | Freq: Once | INTRAVENOUS | Status: AC | PRN
  Administered 2015-06-17: 100 mL via INTRAVENOUS

## 2015-06-17 MED ORDER — POTASSIUM CHLORIDE CRYS ER 20 MEQ PO TBCR
40.0000 meq | EXTENDED_RELEASE_TABLET | Freq: Once | ORAL | Status: AC
  Administered 2015-06-17: 40 meq via ORAL
  Filled 2015-06-17: qty 2

## 2015-06-17 MED ORDER — IPRATROPIUM-ALBUTEROL 0.5-2.5 (3) MG/3ML IN SOLN
3.0000 mL | Freq: Once | RESPIRATORY_TRACT | Status: AC
  Administered 2015-06-17: 3 mL via RESPIRATORY_TRACT
  Filled 2015-06-17: qty 3

## 2015-06-17 MED ORDER — FUROSEMIDE 10 MG/ML IJ SOLN
40.0000 mg | Freq: Once | INTRAMUSCULAR | Status: AC
  Administered 2015-06-17: 40 mg via INTRAVENOUS
  Filled 2015-06-17: qty 4

## 2015-06-17 MED ORDER — SODIUM CHLORIDE 0.9 % IV SOLN: INTRAVENOUS | Status: DC

## 2015-06-17 MED ORDER — SODIUM CHLORIDE 0.9 % IV BOLUS (SEPSIS)
250.0000 mL | Freq: Once | INTRAVENOUS | Status: AC
  Administered 2015-06-17: 21:00:00 via INTRAVENOUS

## 2015-06-17 NOTE — ED Provider Notes (Signed)
CSN: [id number]     Arrival date & time 06/17/15  1849 History   First MD Initiated Contact with Patient 06/17/15 1907     Chief Complaint  Patient presents with  . Shortness of Breath      HPI Pt was seen at 1905. Per pt, c/o gradual onset and worsening of persistent SOB for the past 3 days, worse today. Has been associated with increasing pedal edema over the past several weeks. Pt's family states her O2 Sats at home were "87% R/A." Pt states her symptoms began "after I was outside." States she "feels tired." Denies wheezing, no cough, no back pain, no CP/palpitations, no abd pain, no N/V/D, no fevers, no rash.    Past Medical History  Diagnosis Date  . Hypertension   . Back pain   . Arthritis   . Hyperlipidemia   . CAD (coronary artery disease), native coronary artery-70% LM stenosis 10/25/2014  . DM type 2 (diabetes mellitus, type 2) 10/25/14  . Gout    Past Surgical History  Procedure Laterality Date  . Tonsillectomy    . Bladder tack    . Total hip arthroplasty    . Hernia repair    . Cholecystectomy    . Abdominal hysterectomy    . Appendectomy    . Cardiac catheterization  10/25/14    70% LM stensis, normal EF  . Coronary artery bypass graft N/A 10/29/2014    Procedure: CORONARY ARTERY BYPASS GRAFTING (CABG) TIMES TWO USING LEFT INTERNAL MAMMARY ARTERY AND RIGHT LEG GREATER SAPHENOUS VEIN HARVESTED ENDOSCOPICALLY.;  Surgeon: Ivin Poot, MD;  Location: [place];  Service: Open Heart Surgery;  Laterality: N/A;  . Intraoperative transesophageal echocardiogram N/A 10/29/2014    Procedure: INTRAOPERATIVE TRANSESOPHAGEAL ECHOCARDIOGRAM;  Surgeon: Ivin Poot, MD;  Location: [place];  Service: Open Heart Surgery;  Laterality: N/A;  . Left heart catheterization with coronary angiogram N/A 10/26/2014    Procedure: LEFT HEART CATHETERIZATION WITH CORONARY ANGIOGRAM;  Surgeon: Troy Sine, MD;  Location: [facility] CATH LAB;  Service: Cardiovascular;  Laterality: N/A;   Family History   Problem Relation Age of Onset  . Hypertension Mother   . Hypertension Father   . Coronary artery disease Mother     CABG  . CAD Father     CABG  . Coronary artery disease Brother     Stent   History  Substance Use Topics  . Smoking status: Never Smoker   . Smokeless tobacco: Never Used  . Alcohol Use: No    Review of Systems ROS: Statement: All systems negative except as marked or noted in the HPI; Constitutional: Negative for objective fever and +chills, generalized fatigue. ; ; Eyes: Negative for eye pain, redness and discharge. ; ; ENMT: Negative for ear pain, hoarseness, nasal congestion, sinus pressure and sore throat. ; ; Cardiovascular: Negative for chest pain, palpitations, diaphoresis, +dyspnea and peripheral edema. ; ; Respiratory: Negative for cough, wheezing and stridor. ; ; Gastrointestinal: Negative for nausea, vomiting, diarrhea, abdominal pain, blood in stool, hematemesis, jaundice and rectal bleeding. . ; ; Genitourinary: Negative for dysuria, flank pain and hematuria. ; ; Musculoskeletal: Negative for back pain and neck pain. Negative for swelling and trauma.; ; Skin: Negative for pruritus, rash, abrasions, blisters, bruising and skin lesion.; ; Neuro: Negative for headache, lightheadedness and neck stiffness. Negative for altered level of consciousness , altered mental status, extremity weakness, paresthesias, involuntary movement, seizure and syncope.      Allergies  Review of patient's  allergies indicates no known allergies.  Home Medications   Prior to Admission medications   Medication Sig Start Date End Date Taking? Authorizing Provider  ALPRAZolam Duanne Moron) 0.5 MG tablet Take 0.5 mg by mouth daily as needed. Panic attacks/anxiety.   Yes Historical Provider, MD  aspirin EC 81 MG tablet Take 81 mg by mouth daily.   Yes Historical Provider, MD  atorvastatin (LIPITOR) 80 MG tablet Take 1 tablet (80 mg total) by mouth daily at 6 PM. 11/20/14  Yes Lendon Colonel,  NP  furosemide (LASIX) 20 MG tablet Take 20 mg by mouth daily.   Yes Historical Provider, MD  glucosamine-chondroitin 500-400 MG tablet Take 1 tablet by mouth 3 (three) times daily.   Yes Historical Provider, MD  metoprolol tartrate (LOPRESSOR) 25 MG tablet Take 0.5 tablets (12.5 mg total) by mouth 2 (two) times daily. 11/20/14  Yes Lendon Colonel, NP  naproxen sodium (ANAPROX) 220 MG tablet Take 440 mg by mouth 2 (two) times daily with a meal.   Yes Historical Provider, MD   BP 122/60 mmHg  Pulse 103  Temp(Src) 98.6 F (37 C) (Oral)  Resp 22  Ht 5\' 3"  (1.6 m)  Wt 218 lb (98.884 kg)  BMI 38.63 kg/m2  SpO2 95%   Filed Vitals:   06/17/15 1859 06/17/15 2000 06/17/15 2040 06/17/15 2044  BP: 77/60 122/60    Pulse: 107 103    Temp: 98.6 F (37 C)     TempSrc: Oral     Resp: 24 22    Height: 5\' 3"  (1.6 m)     Weight: 218 lb (98.884 kg)     SpO2:  95% 95% 95%   Filed Vitals:   06/17/15 2040 06/17/15 2044 06/17/15 2119 06/17/15 2143  BP:   110/49   Pulse:   110   Temp:   98.1 F (36.7 C)   TempSrc:   Oral   Resp:   21   Height:      Weight:      SpO2: 95% 95% 95% 86%     Physical Exam  1910: Physical examination:  Nursing notes reviewed; Vital signs and O2 SAT reviewed;  Constitutional: Well developed, Well nourished, Well hydrated, Uncomfortable appearing.; Head:  Normocephalic, atraumatic; Eyes: EOMI, PERRL, No scleral icterus; ENMT: Mouth and pharynx normal, Mucous membranes moist; Neck: Supple, Full range of motion, No lymphadenopathy; Cardiovascular: Tachycardic rate and rhythm, No gallop; Respiratory: Breath sounds coarse & equal bilaterally, faint scattered wheezes. No audible wheezing. Sitting upright, tachypneic, Speaking short phrases.; Chest: Nontender, Movement normal; Abdomen: Soft, Nontender, Nondistended, Normal bowel sounds; Genitourinary: No CVA tenderness; Extremities: Pulses normal, No tenderness, +1 pedal edema bilat. No calf asymmetry.; Neuro: AA&Ox3, Major  CN grossly intact.  Speech clear. No gross focal motor or sensory deficits in extremities.; Skin: Color normal, Warm, Dry.   ED Course  Procedures     EKG Interpretation   Date/Time:  Monday June 17 2015 19:12:05 EDT Ventricular Rate:  106 PR Interval:  154 QRS Duration: 84 QT Interval:  336 QTC Calculation: 446 R Axis:   52 Text Interpretation:  Sinus tachycardia Low voltage, precordial leads  Nonspecific T abnrm, anterolateral leads Baseline wander When compared  with ECG of 10/30/2014 No significant change was found Confirmed by  [facility]  MD, Nunzio Cory [phone]) on 06/17/2015 8:52:23 PM      MDM  MDM Reviewed: previous chart, nursing note and vitals Reviewed previous: labs and ECG Interpretation: labs, ECG and x-ray Total time providing  critical care: 30-74 minutes. This excludes time spent performing separately reportable procedures and services. Consults: admitting MD   CRITICAL CARE Performed by: Alfonzo Feller Total critical care time: 35 Critical care time was exclusive of separately billable procedures and treating other patients. Critical care was necessary to treat or prevent imminent or life-threatening deterioration. Critical care was time spent personally by me on the following activities: development of treatment plan with patient and/or surrogate as well as nursing, discussions with consultants, evaluation of patient's response to treatment, examination of patient, obtaining history from patient or surrogate, ordering and performing treatments and interventions, ordering and review of laboratory studies, ordering and review of radiographic studies, pulse oximetry and re-evaluation of patient's condition.    Results for orders placed or performed during the hospital encounter of 06/17/15  CBC with Differential  Result Value Ref Range   WBC 19.3 (H) 4.0 - 10.5 K/uL   RBC 4.53 3.87 - 5.11 MIL/uL   Hemoglobin 11.7 (L) 12.0 - 15.0 g/dL   HCT 36.9 36.0 - 46.0  %   MCV 81.5 78.0 - 100.0 fL   MCH 25.8 (L) 26.0 - 34.0 pg   MCHC 31.7 30.0 - 36.0 g/dL   RDW 16.6 (H) 11.5 - 15.5 %   Platelets 293 150 - 400 K/uL   Neutrophils Relative % 92 (H) 43 - 77 %   Neutro Abs 18.0 (H) 1.7 - 7.7 K/uL   Lymphocytes Relative 5 (L) 12 - 46 %   Lymphs Abs 0.9 0.7 - 4.0 K/uL   Monocytes Relative 2 (L) 3 - 12 %   Monocytes Absolute 0.4 0.1 - 1.0 K/uL   Eosinophils Relative 1 0 - 5 %   Eosinophils Absolute 0.1 0.0 - 0.7 K/uL   Basophils Relative 0 0 - 1 %   Basophils Absolute 0.0 0.0 - 0.1 K/uL  Basic metabolic panel  Result Value Ref Range   Sodium 141 135 - 145 mmol/L   Potassium 3.2 (L) 3.5 - 5.1 mmol/L   Chloride 108 101 - 111 mmol/L   CO2 21 (L) 22 - 32 mmol/L   Glucose, Bld 126 (H) 65 - 99 mg/dL   BUN 23 (H) 6 - 20 mg/dL   Creatinine, Ser 1.23 (H) 0.44 - 1.00 mg/dL   Calcium 8.6 (L) 8.9 - 10.3 mg/dL   GFR calc non Af Amer 46 (L) >60 mL/min   GFR calc Af Amer 54 (L) >60 mL/min   Anion gap 12 5 - 15  Troponin I  Result Value Ref Range   Troponin I <0.03 <0.031 ng/mL  Urinalysis, Routine w reflex microscopic (not at [facility])  Result Value Ref Range   Color, Urine YELLOW YELLOW   APPearance CLEAR CLEAR   Specific Gravity, Urine 1.025 1.005 - 1.030   pH 5.5 5.0 - 8.0   Glucose, UA NEGATIVE NEGATIVE mg/dL   Hgb urine dipstick NEGATIVE NEGATIVE   Bilirubin Urine NEGATIVE NEGATIVE   Ketones, ur NEGATIVE NEGATIVE mg/dL   Protein, ur NEGATIVE NEGATIVE mg/dL   Urobilinogen, UA 0.2 0.0 - 1.0 mg/dL   Nitrite NEGATIVE NEGATIVE   Leukocytes, UA SMALL (A) NEGATIVE  Brain natriuretic peptide  Result Value Ref Range   B Natriuretic Peptide 54.0 0.0 - 100.0 pg/mL  Lactic acid, plasma  Result Value Ref Range   Lactic Acid, Venous 2.0 0.5 - 2.0 mmol/L  Urine microscopic-add on  Result Value Ref Range   Squamous Epithelial / LPF MANY (A) RARE   WBC, UA 11-20 <  3 WBC/hpf   Bacteria, UA FEW (A) RARE  D-dimer, quantitative  Result Value Ref Range   D-Dimer,  Quant 1.32 (H) 0.00 - 0.48 ug/mL-FEU   Ct Angio Chest Pe W/cm &/or Wo Cm 06/17/2015   CLINICAL DATA:  Shortness of breath beginning 06/14/2015, worsening. Elevated white blood cell count.  EXAM: CT ANGIOGRAPHY CHEST WITH CONTRAST  TECHNIQUE: Multidetector CT imaging of the chest was performed using the standard protocol during bolus administration of intravenous contrast. Multiplanar CT image reconstructions and MIPs were obtained to evaluate the vascular anatomy.  CONTRAST:  100 mL OMNIPAQUE IOHEXOL 350 MG/ML SOLN  COMPARISON:  Plain film of the chest earlier this same day. PA and lateral chest 12/11/2014.  FINDINGS: This study is somewhat limited by bolus timing. No central pulmonary embolus is identified. There is no evidence of right heart strain. The patient is status post CABG. Cardiomegaly is noted. No pleural or pericardial effusion. The thyroid gland is somewhat heterogeneous. No axillary, hilar or mediastinal lymphadenopathy. Extensive bilateral airspace disease identified throughout both lungs.  Visualized upper abdomen shows fatty infiltration of the liver. The patient is status post cholecystectomy. No focal bony abnormality is identified.  Review of the MIP images confirms the above findings.  IMPRESSION: Somewhat technically limited study without evidence of pulmonary edema.  Extensive bilateral airspace disease likely reflects multifocal pneumonia in this patient with an elevated white blood cell count. Pulmonary edema could create a similar appearance.  Cardiomegaly.  Fatty infiltration the liver.   Electronically Signed   By: Inge Rise M.D.   On: 06/17/2015 22:35   Dg Chest Port 1 View 06/17/2015   CLINICAL DATA:  Shortness of breath since this afternoon.  EXAM: PORTABLE CHEST - 1 VIEW  COMPARISON:  Frontal and lateral views 12/11/2014  FINDINGS: Patient is post median sternotomy and CABG. Stable cardiac enlargement allowing for differences in technique. Vascular congestion without overt  pulmonary edema. No large pleural effusion. No confluent airspace disease. No pneumothorax.  IMPRESSION: Stable cardiomegaly.  Mild vascular congestion.   Electronically Signed   By: Jeb Levering M.D.   On: 06/17/2015 19:42    2300:  Pt hypoxic and hypotensive on arrival: BP 77/60 and Sats 86% R/A. Judicious IVF bolus given due pt's hx CHF with immediate and sustained improvement in BP (SBP 110-120's). Duoneb and IV lasix given. Sats drop to 86% R/A when attempts to get out of bed and walk. O2 N/C applied with Sats increasing to 95%. Pt states she "felt a little better" after the neb tx and is requesting another; will dose. Potassium repleted PO. CT scan without PE but with multifocal pneumonia; will tx with IV abx. Dx and testing d/w pt and family.  Questions answered.  Verb understanding, agreeable to admit.   T/C to [facility]. Marin Comment, case discussed, including:  HPI, pertinent PM/SHx, VS/PE, dx testing, ED course and treatment:  Agreeable to admit, requests he will come to the ED for evaluation.    Francine Graven, DO 06/20/15 1524

## 2015-06-17 NOTE — ED Notes (Signed)
Shortness of breath began Friday then subsided, then returned today. Niece stated O2 sats in route to ER were 87%. Has not felt well all weekend - had chills, fatigue. Denies n/v/d. No fevers.

## 2015-06-17 NOTE — H&P (Signed)
[facility] History and Physical  Karina Green ESP:[id number] DOB: 1956/12/13    PCP:   Leonides Grills, MD   Chief Complaint: shortness of breath.   HPI: Karina Green is an 59 y.o. female with hx of CAD, s/p CABGx2 (LIMA to LAD, SVG to OM), DM2, HTN, HLD, gout, presented to the ER with SOB.  She is not a current smoker and was not Dx with emphysema.  She denied distant travel or ill contact.  She has non productive coughs.  Evalatuion in the ER showed elevated D dimer, with CTPA negative for any PE, but showed multifocal PNA.  She was given IV Levaquin, BC was done, and hospitalist was asked to admit her.  SHe has slight elevation of her lactic acid to 2.5.  In the ER, her BP was 77/60 pm on arrival, and responded to 110 after IVF given. CXR showed mild vascular congestion.  Subsequently, EDP gave her IV lasix x 1.     Rewiew of Systems:  Constitutional: Negative for malaise, fever and chills. No significant weight loss or weight gain Eyes: Negative for eye pain, redness and discharge, diplopia, visual changes, or flashes of light. ENMT: Negative for ear pain, hoarseness, nasal congestion, sinus pressure and sore throat. No headaches; tinnitus, drooling, or problem swallowing. Cardiovascular: Negative for chest pain, palpitations, diaphoresis, and peripheral edema. ; No orthopnea, PND Respiratory: Negative for  hemoptysis, wheezing and stridor. No pleuritic chestpain. Gastrointestinal: Negative for nausea, vomiting, diarrhea, constipation, abdominal pain, melena, blood in stool, hematemesis, jaundice and rectal bleeding.    Genitourinary: Negative for frequency, dysuria, incontinence,flank pain and hematuria; Musculoskeletal: Negative for back pain and neck pain. Negative for swelling and trauma.;  Skin: . Negative for pruritus, rash, abrasions, bruising and skin lesion.; ulcerations Neuro: Negative for headache, lightheadedness and neck stiffness. Negative for weakness, altered  level of consciousness , altered mental status, extremity weakness, burning feet, involuntary movement, seizure and syncope.  Psych: negative for anxiety, depression, insomnia, tearfulness, panic attacks, hallucinations, paranoia, suicidal or homicidal ideation    Past Medical History  Diagnosis Date  . Hypertension   . Back pain   . Arthritis   . Hyperlipidemia   . CAD (coronary artery disease), native coronary artery-70% LM stenosis 10/25/2014  . DM type 2 (diabetes mellitus, type 2) 10/25/14  . Gout     Past Surgical History  Procedure Laterality Date  . Tonsillectomy    . Bladder tack    . Total hip arthroplasty    . Hernia repair    . Cholecystectomy    . Abdominal hysterectomy    . Appendectomy    . Cardiac catheterization  10/25/14    70% LM stensis, normal EF  . Coronary artery bypass graft N/A 10/29/2014    Procedure: CORONARY ARTERY BYPASS GRAFTING (CABG) TIMES TWO USING LEFT INTERNAL MAMMARY ARTERY AND RIGHT LEG GREATER SAPHENOUS VEIN HARVESTED ENDOSCOPICALLY.;  Surgeon: Ivin Poot, MD;  Location: [place];  Service: Open Heart Surgery;  Laterality: N/A;  . Intraoperative transesophageal echocardiogram N/A 10/29/2014    Procedure: INTRAOPERATIVE TRANSESOPHAGEAL ECHOCARDIOGRAM;  Surgeon: Ivin Poot, MD;  Location: [place];  Service: Open Heart Surgery;  Laterality: N/A;  . Left heart catheterization with coronary angiogram N/A 10/26/2014    Procedure: LEFT HEART CATHETERIZATION WITH CORONARY ANGIOGRAM;  Surgeon: Troy Sine, MD;  Location: [facility] CATH LAB;  Service: Cardiovascular;  Laterality: N/A;    Medications:  HOME MEDS: Prior to Admission medications   Medication Sig Start  Date End Date Taking? Authorizing Provider  ALPRAZolam Duanne Moron) 0.5 MG tablet Take 0.5 mg by mouth daily as needed. Panic attacks/anxiety.   Yes Historical Provider, MD  aspirin EC 81 MG tablet Take 81 mg by mouth daily.   Yes Historical Provider, MD  atorvastatin (LIPITOR) 80 MG tablet Take 1  tablet (80 mg total) by mouth daily at 6 PM. 11/20/14  Yes Lendon Colonel, NP  furosemide (LASIX) 20 MG tablet Take 20 mg by mouth daily.   Yes Historical Provider, MD  glucosamine-chondroitin 500-400 MG tablet Take 1 tablet by mouth 3 (three) times daily.   Yes Historical Provider, MD  metoprolol tartrate (LOPRESSOR) 25 MG tablet Take 0.5 tablets (12.5 mg total) by mouth 2 (two) times daily. 11/20/14  Yes Lendon Colonel, NP  naproxen sodium (ANAPROX) 220 MG tablet Take 440 mg by mouth 2 (two) times daily with a meal.   Yes Historical Provider, MD     Allergies:  No Known Allergies  Social History:   reports that she has never smoked. She has never used smokeless tobacco. She reports that she does not drink alcohol or use illicit drugs.  Family History: Family History  Problem Relation Age of Onset  . Hypertension Mother   . Hypertension Father   . Coronary artery disease Mother     CABG  . CAD Father     CABG  . Coronary artery disease Brother     Stent     Physical Exam: Filed Vitals:   06/17/15 2119 06/17/15 2143 06/17/15 2320 06/17/15 2324  BP: 110/49  115/57   Pulse: 110  103   Temp: 98.1 F (36.7 C)     TempSrc: Oral     Resp: 21  33   Height:      Weight:      SpO2: 95% 86% 95% 96%   Blood pressure 115/57, pulse 103, temperature 98.1 F (36.7 C), temperature source Oral, resp. rate 33, height 5\' 3"  (1.6 m), weight 98.884 kg (218 lb), SpO2 96 %.  GEN:  Pleasant  patient lying in the stretcher in no acute distress; cooperative with exam. PSYCH:  alert and oriented x4; does not appear anxious or depressed; affect is appropriate. HEENT: Mucous membranes pink and anicteric; PERRLA; EOM intact; no cervical lymphadenopathy nor thyromegaly or carotid bruit; no JVD; There were no stridor. Neck is very supple. Breasts:: Not examined CHEST WALL: No tenderness CHEST: Normal respiration, scattered rhonchi, no wheezing, bibasilar rales.  HEART: Regular rate and  rhythm.  There are no murmur, rub, or gallops.   BACK: No kyphosis or scoliosis; no CVA tenderness ABDOMEN: soft and non-tender; no masses, no organomegaly, normal abdominal bowel sounds; no pannus; no intertriginous candida. There is no rebound and no distention. Rectal Exam: Not done EXTREMITIES: No bone or joint deformity; age-appropriate arthropathy of the hands and knees; no edema; no ulcerations.  There is no calf tenderness. Genitalia: not examined PULSES: 2+ and symmetric SKIN: Normal hydration no rash or ulceration CNS: Cranial nerves 2-12 grossly intact no focal lateralizing neurologic deficit.  Speech is fluent; uvula elevated with phonation, facial symmetry and tongue midline. DTR are normal bilaterally, cerebella exam is intact, barbinski is negative and strengths are equaled bilaterally.  No sensory loss.   Labs on Admission:  Basic Metabolic Panel:  Recent Labs Lab 06/17/15 1905  Karina 141  K 3.2*  CL 108  CO2 21*  GLUCOSE 126*  BUN 23*  CREATININE 1.23*  CALCIUM 8.6*  CBC:  Recent Labs Lab 06/17/15 1905  WBC 19.3*  NEUTROABS 18.0*  HGB 11.7*  HCT 36.9  MCV 81.5  PLT 293   Cardiac Enzymes:  Recent Labs Lab 06/17/15 1905  TROPONINI <0.03   Radiological Exams on Admission: Ct Angio Chest Pe W/cm &/or Wo Cm  06/17/2015   CLINICAL DATA:  Shortness of breath beginning 06/14/2015, worsening. Elevated white blood cell count.  EXAM: CT ANGIOGRAPHY CHEST WITH CONTRAST  TECHNIQUE: Multidetector CT imaging of the chest was performed using the standard protocol during bolus administration of intravenous contrast. Multiplanar CT image reconstructions and MIPs were obtained to evaluate the vascular anatomy.  CONTRAST:  100 mL OMNIPAQUE IOHEXOL 350 MG/ML SOLN  COMPARISON:  Plain film of the chest earlier this same day. PA and lateral chest 12/11/2014.  FINDINGS: This study is somewhat limited by bolus timing. No central pulmonary embolus is identified. There is no evidence  of right heart strain. The patient is status post CABG. Cardiomegaly is noted. No pleural or pericardial effusion. The thyroid gland is somewhat heterogeneous. No axillary, hilar or mediastinal lymphadenopathy. Extensive bilateral airspace disease identified throughout both lungs.  Visualized upper abdomen shows fatty infiltration of the liver. The patient is status post cholecystectomy. No focal bony abnormality is identified.  Review of the MIP images confirms the above findings.  IMPRESSION: Somewhat technically limited study without evidence of pulmonary edema.  Extensive bilateral airspace disease likely reflects multifocal pneumonia in this patient with an elevated white blood cell count. Pulmonary edema could create a similar appearance.  Cardiomegaly.  Fatty infiltration the liver.   Electronically Signed   By: Inge Rise M.D.   On: 06/17/2015 22:35   Dg Chest Port 1 View  06/17/2015   CLINICAL DATA:  Shortness of breath since this afternoon.  EXAM: PORTABLE CHEST - 1 VIEW  COMPARISON:  Frontal and lateral views 12/11/2014  FINDINGS: Patient is post median sternotomy and CABG. Stable cardiac enlargement allowing for differences in technique. Vascular congestion without overt pulmonary edema. No large pleural effusion. No confluent airspace disease. No pneumothorax.  IMPRESSION: Stable cardiomegaly.  Mild vascular congestion.   Electronically Signed   By: Jeb Levering M.D.   On: 06/17/2015 19:42    Assessment/Plan Present on Admission:  . CAP (community acquired pneumonia) . Essential hypertension . CAD (coronary artery disease), native coronary artery-70% LM stenosis  PLAN:  WIll admit her for CAP, multifocal, with borderline hypotension on arrival, and known CAD, with bibasilar rales.  Will hold off on any further IVF.  Continue with IV Levaquin, and give supplemental oxygen. She is stable, full code, and will be admitted to [facility] service.    Other plans as per orders.  Code Status:  FULL Haskel Khan, MD. [facility] Pager [phone] 7pm to 7am.  06/17/2015, 11:56 PM

## 2015-06-18 ENCOUNTER — Encounter (HOSPITAL_COMMUNITY): Payer: Self-pay

## 2015-06-18 ENCOUNTER — Inpatient Hospital Stay (HOSPITAL_COMMUNITY): Payer: No Typology Code available for payment source

## 2015-06-18 DIAGNOSIS — J189 Pneumonia, unspecified organism: Secondary | ICD-10-CM | POA: Diagnosis not present

## 2015-06-18 DIAGNOSIS — I10 Essential (primary) hypertension: Secondary | ICD-10-CM | POA: Diagnosis not present

## 2015-06-18 DIAGNOSIS — A419 Sepsis, unspecified organism: Secondary | ICD-10-CM | POA: Diagnosis not present

## 2015-06-18 DIAGNOSIS — R0902 Hypoxemia: Secondary | ICD-10-CM

## 2015-06-18 DIAGNOSIS — J9601 Acute respiratory failure with hypoxia: Secondary | ICD-10-CM | POA: Diagnosis not present

## 2015-06-18 LAB — COMPREHENSIVE METABOLIC PANEL
ALK PHOS: 63 U/L (ref 38–126)
ALT: 33 U/L (ref 14–54)
AST: 34 U/L (ref 15–41)
Albumin: 3 g/dL — ABNORMAL LOW (ref 3.5–5.0)
Anion gap: 11 (ref 5–15)
BUN: 24 mg/dL — ABNORMAL HIGH (ref 6–20)
CALCIUM: 7.9 mg/dL — AB (ref 8.9–10.3)
CO2: 25 mmol/L (ref 22–32)
Chloride: 106 mmol/L (ref 101–111)
Creatinine, Ser: 1.39 mg/dL — ABNORMAL HIGH (ref 0.44–1.00)
GFR calc Af Amer: 46 mL/min — ABNORMAL LOW (ref >60)
GFR calc non Af Amer: 40 mL/min — ABNORMAL LOW (ref >60)
GLUCOSE: 101 mg/dL — AB (ref 65–99)
POTASSIUM: 3.4 mmol/L — AB (ref 3.5–5.1)
Sodium: 142 mmol/L (ref 135–145)
TOTAL PROTEIN: 6.3 g/dL — AB (ref 6.5–8.1)
Total Bilirubin: 1.1 mg/dL (ref 0.3–1.2)

## 2015-06-18 LAB — GLUCOSE, CAPILLARY
GLUCOSE-CAPILLARY: 108 mg/dL — AB (ref 65–99)
GLUCOSE-CAPILLARY: 91 mg/dL (ref 65–99)
Glucose-Capillary: 110 mg/dL — ABNORMAL HIGH (ref 65–99)
Glucose-Capillary: 99 mg/dL (ref 65–99)
Glucose-Capillary: 111 mg/dL — ABNORMAL HIGH (ref 65–99)

## 2015-06-18 LAB — PROCALCITONIN: Procalcitonin: 6.54 ng/mL

## 2015-06-18 LAB — CBC
HCT: 33.7 % — ABNORMAL LOW (ref 36.0–46.0)
Hemoglobin: 10.6 g/dL — ABNORMAL LOW (ref 12.0–15.0)
MCH: 25.5 pg — ABNORMAL LOW (ref 26.0–34.0)
MCHC: 31.5 g/dL (ref 30.0–36.0)
MCV: 81 fL (ref 78.0–100.0)
Platelets: 322 10*3/uL (ref 150–400)
RBC: 4.16 MIL/uL (ref 3.87–5.11)
RDW: 16.8 % — AB (ref 11.5–15.5)
WBC: 20.4 10*3/uL — ABNORMAL HIGH (ref 4.0–10.5)

## 2015-06-18 LAB — STREP PNEUMONIAE URINARY ANTIGEN: STREP PNEUMO URINARY ANTIGEN: NEGATIVE

## 2015-06-18 MED ORDER — DIPHENHYDRAMINE HCL 25 MG PO CAPS
ORAL_CAPSULE | ORAL | Status: AC
  Administered 2015-06-18: 25 mg
  Filled 2015-06-18: qty 1

## 2015-06-18 MED ORDER — SODIUM CHLORIDE 0.9 % IJ SOLN
3.0000 mL | Freq: Two times a day (BID) | INTRAMUSCULAR | Status: DC
  Administered 2015-06-18 – 2015-06-19 (×5): 3 mL via INTRAVENOUS

## 2015-06-18 MED ORDER — ASPIRIN EC 81 MG PO TBEC
81.0000 mg | DELAYED_RELEASE_TABLET | Freq: Every day | ORAL | Status: DC
  Administered 2015-06-18 – 2015-06-19 (×2): 81 mg via ORAL
  Filled 2015-06-18 (×2): qty 1

## 2015-06-18 MED ORDER — ENOXAPARIN SODIUM 40 MG/0.4ML ~~LOC~~ SOLN
40.0000 mg | SUBCUTANEOUS | Status: DC
  Administered 2015-06-18 – 2015-06-19 (×2): 40 mg via SUBCUTANEOUS
  Filled 2015-06-18 (×2): qty 0.4

## 2015-06-18 MED ORDER — ATORVASTATIN CALCIUM 40 MG PO TABS
80.0000 mg | ORAL_TABLET | Freq: Every day | ORAL | Status: DC
  Administered 2015-06-18 – 2015-06-19 (×2): 80 mg via ORAL
  Filled 2015-06-18 (×2): qty 2

## 2015-06-18 MED ORDER — INSULIN ASPART 100 UNIT/ML ~~LOC~~ SOLN: 0.0000 [IU] | Freq: Three times a day (TID) | SUBCUTANEOUS | Status: DC

## 2015-06-18 MED ORDER — LEVOFLOXACIN IN D5W 750 MG/150ML IV SOLN
750.0000 mg | INTRAVENOUS | Status: DC
  Administered 2015-06-18 – 2015-06-19 (×2): 750 mg via INTRAVENOUS
  Filled 2015-06-18 (×2): qty 150

## 2015-06-18 NOTE — Progress Notes (Signed)
*  PRELIMINARY RESULTS* Echocardiogram 2D Echocardiogram has been performed.  Leavy Cella 06/18/2015, 3:10 PM

## 2015-06-18 NOTE — Progress Notes (Signed)
PATIENT DETAILS Name: Karina Green Age: 59 y.o. Sex: female Date of Birth: [birth year] Admit Date: 06/17/2015 Admitting Physician Orvan Falconer, MD WUJ:[email or web address],WGNFAOZ M, MD  Subjective: Claims to be significantly improved.Much less SOB  Assessment/Plan: Principal Problem: CAP (community acquired pneumonia):seems to have rapidly improved,continue IV Levaquin. Check Blood culture, Urine legionella/strep Antigen.Follow procalcitonin. Follow clinical course  Active Problems: Sepsis:secondary to above. Hypotension has resolved. Continue IV Abx, follow cultures  Acute Hypoxic Resp Failure: secondary to PNA. Titrate off O2.   Essential hypertension:Hypotensive on admission-BP soft-continue to hold anti-hypertensives.   CAD (coronary artery disease), native coronary artery-70% LM stenosis-s/p CABG Nov 2015:continue ASA,statins. Resume Beta Blocker when able.   DM2 (diabetes mellitus, type 2):CBG's stable. Continue SSI.  Disposition: Remain inpatient-home in 1-2 days if clinical improvement continues  Antimicrobial agents  See below  Anti-infectives    Start     Dose/Rate Route Frequency Ordered Stop   06/18/15 2300  levofloxacin (LEVAQUIN) IVPB 750 mg     750 mg 100 mL/hr over 90 Minutes Intravenous Every 24 hours 06/18/15 0107     06/17/15 2315  levofloxacin (LEVAQUIN) IVPB 750 mg     750 mg 100 mL/hr over 90 Minutes Intravenous  Once 06/17/15 2302 06/18/15 0051      DVT Prophylaxis: Prophylactic Lovenox   Code Status: Full code   Family Communication Spouse at bedside  Procedures: None  CONSULTS:  None  Time spent 30 minutes-Greater than 50% of this time was spent in counseling, explanation of diagnosis, planning of further management, and coordination of care.  MEDICATIONS: Scheduled Meds: . aspirin EC  81 mg Oral Daily  . atorvastatin  80 mg Oral q1800  . enoxaparin (LOVENOX) injection  40 mg Subcutaneous Q24H  . insulin aspart  0-9 Units  Subcutaneous TID WC  . levofloxacin (LEVAQUIN) IV  750 mg Intravenous Q24H  . sodium chloride  3 mL Intravenous Q12H   Continuous Infusions:  PRN Meds:.    PHYSICAL EXAM: Vital signs in last 24 hours: Filed Vitals:   06/17/15 2320 06/17/15 2324 06/17/15 2330 06/18/15 0109  BP: 115/57  100/53 105/59  Pulse: 103  95 100  Temp:    97.9 F (36.6 C)  TempSrc:    Oral  Resp: 33  21 18  Height:    5\' 3"  (1.6 m)  Weight:    102.74 kg (226 lb 8 oz)  SpO2: 95% 96% 100% 95%    Weight change:  Filed Weights   06/17/15 1859 06/18/15 0109  Weight: 98.884 kg (218 lb) 102.74 kg (226 lb 8 oz)   Body mass index is 40.13 kg/(m^2).   Gen Exam: Awake and alert with clear speech.   Neck: Supple, No JVD.   Chest: B/L Clear.   CVS: S1 S2 Regular, no murmurs.  Abdomen: soft, BS +, non tender, non distended.  Extremities: no edema, lower extremities warm to touch. Neurologic: Non Focal.   Skin: No Rash.   Wounds: N/A.   Intake/Output from previous day:  Intake/Output Summary (Last 24 hours) at 06/18/15 0935 Last data filed at 06/18/15 0053  Gross per 24 hour  Intake      0 ml  Output    900 ml  Net   -900 ml     LAB RESULTS: CBC  Recent Labs Lab 06/17/15 1905 06/18/15 0601  WBC 19.3* 20.4*  HGB 11.7* 10.6*  HCT 36.9 33.7*  PLT  293 322  MCV 81.5 81.0  MCH 25.8* 25.5*  MCHC 31.7 31.5  RDW 16.6* 16.8*  LYMPHSABS 0.9  --   MONOABS 0.4  --   EOSABS 0.1  --   BASOSABS 0.0  --     Chemistries   Recent Labs Lab 06/17/15 1905 06/18/15 0601  NA 141 142  K 3.2* 3.4*  CL 108 106  CO2 21* 25  GLUCOSE 126* 101*  BUN 23* 24*  CREATININE 1.23* 1.39*  CALCIUM 8.6* 7.9*    CBG:  Recent Labs Lab 06/18/15 0156 06/18/15 0721  GLUCAP 110* 99    GFR Estimated Creatinine Clearance: 48.6 mL/min (by C-G formula based on Cr of 1.39).  Coagulation profile No results for input(s): INR, PROTIME in the last 168 hours.  Cardiac Enzymes  Recent Labs Lab 06/17/15 1905    TROPONINI <0.03    Invalid input(s): POCBNP  Recent Labs  06/17/15 1958  DDIMER 1.32*   No results for input(s): HGBA1C in the last 72 hours. No results for input(s): CHOL, HDL, LDLCALC, TRIG, CHOLHDL, LDLDIRECT in the last 72 hours. No results for input(s): TSH, T4TOTAL, T3FREE, THYROIDAB in the last 72 hours.  Invalid input(s): FREET3 No results for input(s): VITAMINB12, FOLATE, FERRITIN, TIBC, IRON, RETICCTPCT in the last 72 hours. No results for input(s): LIPASE, AMYLASE in the last 72 hours.  Urine Studies No results for input(s): UHGB, CRYS in the last 72 hours.  Invalid input(s): UACOL, UAPR, USPG, UPH, UTP, UGL, UKET, UBIL, UNIT, UROB, ULEU, UEPI, UWBC, URBC, UBAC, CAST, UCOM, BILUA  MICROBIOLOGY: No results found for this or any previous visit (from the past 240 hour(s)).  RADIOLOGY STUDIES/RESULTS: Ct Angio Chest Pe W/cm &/or Wo Cm  06/17/2015   CLINICAL DATA:  Shortness of breath beginning 06/14/2015, worsening. Elevated white blood cell count.  EXAM: CT ANGIOGRAPHY CHEST WITH CONTRAST  TECHNIQUE: Multidetector CT imaging of the chest was performed using the standard protocol during bolus administration of intravenous contrast. Multiplanar CT image reconstructions and MIPs were obtained to evaluate the vascular anatomy.  CONTRAST:  100 mL OMNIPAQUE IOHEXOL 350 MG/ML SOLN  COMPARISON:  Plain film of the chest earlier this same day. PA and lateral chest 12/11/2014.  FINDINGS: This study is somewhat limited by bolus timing. No central pulmonary embolus is identified. There is no evidence of right heart strain. The patient is status post CABG. Cardiomegaly is noted. No pleural or pericardial effusion. The thyroid gland is somewhat heterogeneous. No axillary, hilar or mediastinal lymphadenopathy. Extensive bilateral airspace disease identified throughout both lungs.  Visualized upper abdomen shows fatty infiltration of the liver. The patient is status post cholecystectomy. No  focal bony abnormality is identified.  Review of the MIP images confirms the above findings.  IMPRESSION: Somewhat technically limited study without evidence of pulmonary edema.  Extensive bilateral airspace disease likely reflects multifocal pneumonia in this patient with an elevated white blood cell count. Pulmonary edema could create a similar appearance.  Cardiomegaly.  Fatty infiltration the liver.   Electronically Signed   By: Inge Rise M.D.   On: 06/17/2015 22:35   Dg Chest Port 1 View  06/17/2015   CLINICAL DATA:  Shortness of breath since this afternoon.  EXAM: PORTABLE CHEST - 1 VIEW  COMPARISON:  Frontal and lateral views 12/11/2014  FINDINGS: Patient is post median sternotomy and CABG. Stable cardiac enlargement allowing for differences in technique. Vascular congestion without overt pulmonary edema. No large pleural effusion. No confluent airspace disease. No pneumothorax.  IMPRESSION: Stable cardiomegaly.  Mild vascular congestion.   Electronically Signed   By: Jeb Levering M.D.   On: 06/17/2015 19:42    Oren Binet, MD  [facility] Pager:[phone]  If 7PM-7AM, please contact night-coverage [email or web address] Password [email or web address] 06/18/2015, 9:35 AM   LOS: 1 day

## 2015-06-18 NOTE — Progress Notes (Signed)
UR chart review completed.  

## 2015-06-18 NOTE — Care Management Note (Signed)
Case Management Note  Patient Details  Name: Karina Green MRN: [id number] Date of Birth: Nov 20, 1956  Subjective/Objective:                  Pt admitted from home with pneumonia. Pt lives with family and will return home at discharge. Pt is fairly independent with ADL's. Pt has a cane and walker if needed for prn use.  Action/Plan: No CM needs noted. Will continue to follow for discharge planning needs.  Expected Discharge Date:                  Expected Discharge Plan:  Home/Self Care  In-House Referral:  NA  Discharge planning Services  CM Consult  Post Acute Care Choice:  NA Choice offered to:  NA  DME Arranged:    DME Agency:     HH Arranged:    HH Agency:     Status of Service:     Medicare Important Message Given:    Date Medicare IM Given:    Medicare IM give by:    Date Additional Medicare IM Given:    Additional Medicare Important Message give by:     If discussed at [place] of Stay Meetings, dates discussed:    Additional Comments:  Joylene Draft, RN 06/18/2015, 12:58 PM

## 2015-06-19 DIAGNOSIS — I350 Nonrheumatic aortic (valve) stenosis: Secondary | ICD-10-CM | POA: Diagnosis not present

## 2015-06-19 DIAGNOSIS — N189 Chronic kidney disease, unspecified: Secondary | ICD-10-CM

## 2015-06-19 DIAGNOSIS — J189 Pneumonia, unspecified organism: Secondary | ICD-10-CM | POA: Diagnosis not present

## 2015-06-19 DIAGNOSIS — J9601 Acute respiratory failure with hypoxia: Secondary | ICD-10-CM | POA: Diagnosis not present

## 2015-06-19 DIAGNOSIS — E1122 Type 2 diabetes mellitus with diabetic chronic kidney disease: Secondary | ICD-10-CM | POA: Diagnosis not present

## 2015-06-19 DIAGNOSIS — I10 Essential (primary) hypertension: Secondary | ICD-10-CM | POA: Diagnosis not present

## 2015-06-19 DIAGNOSIS — I2510 Atherosclerotic heart disease of native coronary artery without angina pectoris: Secondary | ICD-10-CM | POA: Diagnosis not present

## 2015-06-19 LAB — CBC
HCT: 33 % — ABNORMAL LOW (ref 36.0–46.0)
Hemoglobin: 10 g/dL — ABNORMAL LOW (ref 12.0–15.0)
MCH: 25 pg — ABNORMAL LOW (ref 26.0–34.0)
MCHC: 30.3 g/dL (ref 30.0–36.0)
MCV: 82.5 fL (ref 78.0–100.0)
Platelets: 288 10*3/uL (ref 150–400)
RBC: 4 MIL/uL (ref 3.87–5.11)
RDW: 16.9 % — ABNORMAL HIGH (ref 11.5–15.5)
WBC: 11 10*3/uL — ABNORMAL HIGH (ref 4.0–10.5)

## 2015-06-19 LAB — HIV ANTIBODY (ROUTINE TESTING W REFLEX): HIV SCREEN 4TH GENERATION: NONREACTIVE

## 2015-06-19 LAB — GLUCOSE, CAPILLARY
GLUCOSE-CAPILLARY: 100 mg/dL — AB (ref 65–99)
Glucose-Capillary: 103 mg/dL — ABNORMAL HIGH (ref 65–99)
Glucose-Capillary: 94 mg/dL (ref 65–99)

## 2015-06-19 LAB — BASIC METABOLIC PANEL
ANION GAP: 8 (ref 5–15)
BUN: 19 mg/dL (ref 6–20)
CALCIUM: 8.2 mg/dL — AB (ref 8.9–10.3)
CO2: 26 mmol/L (ref 22–32)
CREATININE: 1.22 mg/dL — AB (ref 0.44–1.00)
Chloride: 108 mmol/L (ref 101–111)
GFR, EST AFRICAN AMERICAN: 54 mL/min — AB (ref >60)
GFR, EST NON AFRICAN AMERICAN: 47 mL/min — AB (ref >60)
Glucose, Bld: 107 mg/dL — ABNORMAL HIGH (ref 65–99)
Potassium: 3.3 mmol/L — ABNORMAL LOW (ref 3.5–5.1)
Sodium: 142 mmol/L (ref 135–145)

## 2015-06-19 MED ORDER — POTASSIUM CHLORIDE CRYS ER 20 MEQ PO TBCR
40.0000 meq | EXTENDED_RELEASE_TABLET | Freq: Once | ORAL | Status: AC
  Administered 2015-06-19: 40 meq via ORAL
  Filled 2015-06-19: qty 2

## 2015-06-19 NOTE — Progress Notes (Signed)
Patient O2 sats 82% room air. Reapplied O2 at 2lpm via nasal cannula O2 sats 98%.

## 2015-06-19 NOTE — Progress Notes (Signed)
[facility] PROGRESS NOTE  Karina Green TDD:[id number] DOB: 03/26/1956 DOA: 06/17/2015 PCP: Leonides Grills, MD  Brief Summary  The patient is a 59 year old female with history of coronary artery disease status post CABG 2 with LIMA to LAD, saphenous venous graft to OM, diabetes mellitus type 2, hypertension, hyperlipidemia, and gout who presented to the emergency department with progressive shortness of breath. She had an elevated d-dimer and follow-up CT angio chest demonstrated multifocal pneumonia.  She was afebrile but had a white blood cell count near 20,000.  She was started on levofloxacin.  Assessment/Plan  Sepsis and acute hypoxic respiratory failure secondary to community-acquired pneumonia, hypotension and leukocytosis have resolved. -  Continue IV antibiotics -  Blood cultures: No growth to date -  Strep pneumo negative -  Legionella pending  -  HIV nonreactive  -   we will attempt to transition to room air today, ambulatory pulse ox when able  -  Repeat pro-calcitonin tomorrow  Essential hypertension, blood pressure stable today -  Continue to hold blood pressure medications  Coronary artery disease -  Continue aspirin, statin, and possibly will resume beta blocker tomorrow if blood pressure remains stable  Diabetes mellitus type 2, CBGs low normal - Continue sliding scale insulin -  Check  A1c  Hypokalemia, mild and likely due to decreased by mouth intake -  Oral potassium repletion  CKD stage III, creatinine stable near 1.2-1.3 -  Minimize nephrotoxicity and renally dose medications  Mild normocytic anemia, likely due to acute illness in the setting of chronic kidney disease -  Had work up less than a year ago (10/2014) which was normal -  Defer to PCP Diabetic  Diet:  Diabetic Access:  PIV IVF:  Off  Proph: Lovenox Code Status: Full Family Communication: Patient alone Disposition Plan:Pending further improvement in hypoxia, anticipate home  possibly tomorrow  Consultants:  None  Procedures:  CXR:  Stable cardiomegaly with mild vascular congestion  CT angio chest with multifocal pneumonia vs. Pulmonary edema, cardiomegaly, fatty liver  ECHO:  Moderate LVF, EF 60-65%, grade 1 DD, Mild AS with mean gradient 25mmHg, peak 27 mmHg, valve area 1.5 cm^2, VI ratio of LVOT to aortic valve 0.48.    Antibiotics:  Levofloxacin 6/27 >   HPI/Subjective:  Feeling much better. States that the swelling in her legs has gone down considerably. She has been able to ambulate with less dyspnea to the bathroom. Still coughing. Denies fevers, chills, nausea, vomiting.  Objective: Filed Vitals:   06/18/15 1524 06/18/15 2000 06/18/15 2209 06/19/15 0604  BP: 126/68  121/56 114/59  Pulse: 89  89 86  Temp: 98.1 F (36.7 C)  98.1 F (36.7 C) 97.7 F (36.5 C)  TempSrc:   Oral Oral  Resp: 20  20 20   Height:      Weight:      SpO2: 100% 98% 90% 90%    Intake/Output Summary (Last 24 hours) at 06/19/15 0931 Last data filed at 06/19/15 0900  Gross per 24 hour  Intake   1233 ml  Output   1650 ml  Net   -417 ml   Filed Weights   06/17/15 1859 06/18/15 0109  Weight: 98.884 kg (218 lb) 102.74 kg (226 lb 8 oz)    Exam:   General:   adult female, No acute distress  HEENT:  NCAT, MMM  Cardiovascular:  RRR, nl S1, S2 no mrg, 2+ pulses, warm extremities  Respiratory:   diminished at the right lateral base, otherwise clear  to auscultation bilaterally, no increased WOB  Abdomen:   NABS, soft, NT/ND  MSK:   Normal tone and bulk, no LEE  Neuro:  Grossly intact  Data Reviewed: Basic Metabolic Panel:  Recent Labs Lab 06/17/15 1905 06/18/15 0601 06/19/15 0613  NA 141 142 142  K 3.2* 3.4* 3.3*  CL 108 106 108  CO2 21* 25 26  GLUCOSE 126* 101* 107*  BUN 23* 24* 19  CREATININE 1.23* 1.39* 1.22*  CALCIUM 8.6* 7.9* 8.2*   Liver Function Tests:  Recent Labs Lab 06/18/15 0601  AST 34  ALT 33  ALKPHOS 63  BILITOT 1.1   PROT 6.3*  ALBUMIN 3.0*   No results for input(s): LIPASE, AMYLASE in the last 168 hours. No results for input(s): AMMONIA in the last 168 hours. CBC:  Recent Labs Lab 06/17/15 1905 06/18/15 0601 06/19/15 0613  WBC 19.3* 20.4* 11.0*  NEUTROABS 18.0*  --   --   HGB 11.7* 10.6* 10.0*  HCT 36.9 33.7* 33.0*  MCV 81.5 81.0 82.5  PLT 293 322 288   Cardiac Enzymes:  Recent Labs Lab 06/17/15 1905  TROPONINI <0.03   BNP (last 3 results)  Recent Labs  06/17/15 1920  BNP 54.0    ProBNP (last 3 results) No results for input(s): PROBNP in the last 8760 hours.  CBG:  Recent Labs Lab 06/18/15 0721 06/18/15 1103 06/18/15 1650 06/18/15 2209 06/19/15 0726  GLUCAP 99 108* 91 111* 103*    No results found for this or any previous visit (from the past 240 hour(s)).   Studies: Ct Angio Chest Pe W/cm &/or Wo Cm  06/17/2015   CLINICAL DATA:  Shortness of breath beginning 06/14/2015, worsening. Elevated white blood cell count.  EXAM: CT ANGIOGRAPHY CHEST WITH CONTRAST  TECHNIQUE: Multidetector CT imaging of the chest was performed using the standard protocol during bolus administration of intravenous contrast. Multiplanar CT image reconstructions and MIPs were obtained to evaluate the vascular anatomy.  CONTRAST:  100 mL OMNIPAQUE IOHEXOL 350 MG/ML SOLN  COMPARISON:  Plain film of the chest earlier this same day. PA and lateral chest 12/11/2014.  FINDINGS: This study is somewhat limited by bolus timing. No central pulmonary embolus is identified. There is no evidence of right heart strain. The patient is status post CABG. Cardiomegaly is noted. No pleural or pericardial effusion. The thyroid gland is somewhat heterogeneous. No axillary, hilar or mediastinal lymphadenopathy. Extensive bilateral airspace disease identified throughout both lungs.  Visualized upper abdomen shows fatty infiltration of the liver. The patient is status post cholecystectomy. No focal bony abnormality is  identified.  Review of the MIP images confirms the above findings.  IMPRESSION: Somewhat technically limited study without evidence of pulmonary edema.  Extensive bilateral airspace disease likely reflects multifocal pneumonia in this patient with an elevated white blood cell count. Pulmonary edema could create a similar appearance.  Cardiomegaly.  Fatty infiltration the liver.   Electronically Signed   By: Inge Rise M.D.   On: 06/17/2015 22:35   Dg Chest Port 1 View  06/17/2015   CLINICAL DATA:  Shortness of breath since this afternoon.  EXAM: PORTABLE CHEST - 1 VIEW  COMPARISON:  Frontal and lateral views 12/11/2014  FINDINGS: Patient is post median sternotomy and CABG. Stable cardiac enlargement allowing for differences in technique. Vascular congestion without overt pulmonary edema. No large pleural effusion. No confluent airspace disease. No pneumothorax.  IMPRESSION: Stable cardiomegaly.  Mild vascular congestion.   Electronically Signed   By: Jeb Levering  M.D.   On: 06/17/2015 19:42    Scheduled Meds: . aspirin EC  81 mg Oral Daily  . atorvastatin  80 mg Oral q1800  . enoxaparin (LOVENOX) injection  40 mg Subcutaneous Q24H  . insulin aspart  0-9 Units Subcutaneous TID WC  . levofloxacin (LEVAQUIN) IV  750 mg Intravenous Q24H  . sodium chloride  3 mL Intravenous Q12H   Continuous Infusions:   Principal Problem:   CAP (community acquired pneumonia) Active Problems:   Essential hypertension   CAD (coronary artery disease), native coronary artery-70% LM stenosis   DM2 (diabetes mellitus, type 2)    Time spent: 30 min    Harolyn Cocker, [facility] Pager [phone]. If 7PM-7AM, please contact night-coverage at [email or web address], password [facility] 06/19/2015, 9:31 AM  LOS: 2 days

## 2015-06-20 ENCOUNTER — Inpatient Hospital Stay (HOSPITAL_COMMUNITY): Payer: No Typology Code available for payment source

## 2015-06-20 DIAGNOSIS — J9601 Acute respiratory failure with hypoxia: Secondary | ICD-10-CM | POA: Diagnosis not present

## 2015-06-20 DIAGNOSIS — I2510 Atherosclerotic heart disease of native coronary artery without angina pectoris: Secondary | ICD-10-CM | POA: Diagnosis not present

## 2015-06-20 DIAGNOSIS — J189 Pneumonia, unspecified organism: Secondary | ICD-10-CM | POA: Diagnosis not present

## 2015-06-20 DIAGNOSIS — I350 Nonrheumatic aortic (valve) stenosis: Secondary | ICD-10-CM

## 2015-06-20 DIAGNOSIS — N183 Chronic kidney disease, stage 3 unspecified: Secondary | ICD-10-CM

## 2015-06-20 DIAGNOSIS — E876 Hypokalemia: Secondary | ICD-10-CM

## 2015-06-20 DIAGNOSIS — A419 Sepsis, unspecified organism: Secondary | ICD-10-CM

## 2015-06-20 DIAGNOSIS — I5032 Chronic diastolic (congestive) heart failure: Secondary | ICD-10-CM

## 2015-06-20 LAB — CBC
HCT: 32.9 % — ABNORMAL LOW (ref 36.0–46.0)
Hemoglobin: 10.2 g/dL — ABNORMAL LOW (ref 12.0–15.0)
MCH: 25.6 pg — AB (ref 26.0–34.0)
MCHC: 31 g/dL (ref 30.0–36.0)
MCV: 82.5 fL (ref 78.0–100.0)
PLATELETS: 308 10*3/uL (ref 150–400)
RBC: 3.99 MIL/uL (ref 3.87–5.11)
RDW: 16.5 % — ABNORMAL HIGH (ref 11.5–15.5)
WBC: 8.4 10*3/uL (ref 4.0–10.5)

## 2015-06-20 LAB — BASIC METABOLIC PANEL
ANION GAP: 7 (ref 5–15)
BUN: 20 mg/dL (ref 6–20)
CO2: 25 mmol/L (ref 22–32)
Calcium: 8.6 mg/dL — ABNORMAL LOW (ref 8.9–10.3)
Chloride: 109 mmol/L (ref 101–111)
Creatinine, Ser: 1.22 mg/dL — ABNORMAL HIGH (ref 0.44–1.00)
GFR calc Af Amer: 54 mL/min — ABNORMAL LOW (ref >60)
GFR, EST NON AFRICAN AMERICAN: 47 mL/min — AB (ref >60)
Glucose, Bld: 108 mg/dL — ABNORMAL HIGH (ref 65–99)
Potassium: 3.9 mmol/L (ref 3.5–5.1)
SODIUM: 141 mmol/L (ref 135–145)

## 2015-06-20 LAB — PROCALCITONIN: PROCALCITONIN: 1.17 ng/mL

## 2015-06-20 LAB — LEGIONELLA ANTIGEN, URINE

## 2015-06-20 LAB — GLUCOSE, CAPILLARY
Glucose-Capillary: 111 mg/dL — ABNORMAL HIGH (ref 65–99)
Glucose-Capillary: 92 mg/dL (ref 65–99)

## 2015-06-20 MED ORDER — LEVOFLOXACIN 750 MG PO TABS: 750.0000 mg | ORAL_TABLET | Freq: Every day | ORAL | Status: DC

## 2015-06-20 NOTE — Care Management Note (Signed)
Case Management Note  Patient Details  Name: Karina Green MRN: [id number] Date of Birth: [birth year]  Subjective/Objective:                    Action/Plan:   Expected Discharge Date:                  Expected Discharge Plan:  Home/Self Care  In-House Referral:  NA  Discharge planning Services  CM Consult  Post Acute Care Choice:  NA Choice offered to:  NA  DME Arranged:    DME Agency:     HH Arranged:    HH Agency:     Status of Service:  Completed, signed off  Medicare Important Message Given:    Date Medicare IM Given:    Medicare IM give by:    Date Additional Medicare IM Given:    Additional Medicare Important Message give by:     If discussed at [place] of Stay Meetings, dates discussed:    Additional Comments: Pt discharged home today. Pt does not qualify for home O2 at this time due to ambulating O2 sats 91% on Ra. No other CM needs noted. Pt stated that she thinks she can pay for her AB. Christinia Gully [place], RN 06/20/2015, 9:18 AM

## 2015-06-20 NOTE — Discharge Summary (Addendum)
Physician Discharge Summary  Karina Green DZH:[id number] DOB: 06-28-1956 DOA: 06/17/2015  PCP: Leonides Grills, MD  Admit date: 06/17/2015 Discharge date: 06/20/2015  Recommendations for Outpatient Follow-up:  1. F/u with primary care doctor in 1 week for reevaluation.  Repeat CBC and BMP to check WBC, anemia, and creatinine.   2. F/u blood cultures and legionella, A1c 3. Consider referral to cardiology for monitoring of aortic valve stenosis if she develops concerning symptoms  Discharge Diagnoses:  Principal Problem:   CAP (community acquired pneumonia) Active Problems:   Essential hypertension   CAD (coronary artery disease), native coronary artery-70% LM stenosis   DM2 (diabetes mellitus, type 2)   Chronic diastolic heart failure   Aortic valve stenosis, mild   CKD (chronic kidney disease), stage III   Hypokalemia   Sepsis   Acute respiratory failure with hypoxia   Discharge Condition: stable, improved  Diet recommendation: diabetic, low sodium  Wt Readings from Last 3 Encounters:  06/18/15 102.74 kg (226 lb 8 oz)  04/30/15 101.152 kg (223 lb)  02/22/15 97.07 kg (214 lb)    History of present illness:  The patient is a 59 year old female with history of coronary artery disease status post CABG 2 with LIMA to LAD, saphenous venous graft to OM, diabetes mellitus type 2, hypertension, hyperlipidemia, and gout who presented to the emergency department with progressive shortness of breath. She had an elevated d-dimer and follow-up CT angio chest demonstrated multifocal pneumonia. She was afebrile but had a white blood cell count near 20,000. She was started on levofloxacin.  Hospital Course:   Sepsis and acute hypoxic respiratory failure secondary to community-acquired pneumonia.  She was initially hypotensive but her hypotension and leukocytosis resolved with IVF and levofloxacin.  Her blood cultures are so far NGTD.  Strep pneumo ag was negative and Legionella is pending  at time of discharge.  HIV nonreactive.  She was able to transition to room air and ambulate with O2 levels 91% and greater prior to discharge.  She was discharged with oral levofloxacin to complete a 7-day course, four days left in therapy.  Essential hypertension, her hypotension resolved and she was restarted on her blood pressures medications.    Coronary artery disease, continued aspirin, statin.  Her beta blocker was initially held due to hypotension and sepsis, but was resumed.    Diabetes mellitus type 2, CBGs remained low normal and she was diet-controlled primarily.  A1c pending at time of discharge  Hypokalemia, mild and likely due to decreased by mouth intake.  Resolved with oral potassium repletion.  CKD stage III, creatinine remained stable near 1.2-1.3.  Mild normocytic anemia, likely due to acute illness in the setting of chronic kidney disease.  Had work up less than a year ago (10/2014) which was normal.  Defer to PCP  Mild aortic valve stenosis, PCP to continue to screen for symptoms.  Diastolic dysfunction, possible chronic diastolic heart failure vs. Hypervolemia secondary to CKD.  Lasix was initially held due to sepsis, but resumed at discharge.  Consultants:  None  Procedures:  CXR: Stable cardiomegaly with mild vascular congestion  CT angio chest with multifocal pneumonia vs. Pulmonary edema, cardiomegaly, fatty liver  ECHO: Moderate LVF, EF 60-65%, grade 1 DD, Mild AS with mean gradient 45mmHg, peak 27 mmHg, valve area 1.5 cm^2, VI ratio of LVOT to aortic valve 0.48.  Antibiotics:  Levofloxacin 6/27 >  Discharge Exam: Filed Vitals:   06/20/15 0521  BP: 137/62  Pulse: 67  Temp: 97.9 F (  36.6 C)  Resp: 20   Filed Vitals:   06/19/15 0933 06/19/15 1430 06/19/15 2145 06/20/15 0521  BP:  165/65 132/59 137/62  Pulse:  71 85 67  Temp:  97.6 F (36.4 C) 97.9 F (36.6 C) 97.9 F (36.6 C)  TempSrc:  Oral Oral Oral  Resp:  20 20 20   Height:       Weight:      SpO2: 98% 100% 100% 100%    General: adult female, No acute distress, sitting in bed  HEENT: NCAT, MMM  Cardiovascular: RRR, nl S1, S2 no mrg, 2+ pulses, warm extremities  Respiratory:Clear to auscultation bilaterally, no increased WOB  Abdomen: NABS, soft, NT/ND  MSK: Normal tone and bulk, no LEE  Neuro: Grossly intact  Discharge Instructions      Discharge Instructions    (HEART FAILURE PATIENTS) Call MD:  Anytime you have any of the following symptoms: 1) 3 pound weight gain in 24 hours or 5 pounds in 1 week 2) shortness of breath, with or without a dry hacking cough 3) swelling in the hands, feet or stomach 4) if you have to sleep on extra pillows at night in order to breathe.    Complete by:  As directed      Call MD for:  difficulty breathing, headache or visual disturbances    Complete by:  As directed      Call MD for:  extreme fatigue    Complete by:  As directed      Call MD for:  hives    Complete by:  As directed      Call MD for:  persistant dizziness or light-headedness    Complete by:  As directed      Call MD for:  persistant nausea and vomiting    Complete by:  As directed      Call MD for:  severe uncontrolled pain    Complete by:  As directed      Call MD for:  temperature >100.4    Complete by:  As directed      Diet - low sodium heart healthy    Complete by:  As directed      Diet Carb Modified    Complete by:  As directed      Discharge instructions    Complete by:  As directed   You were hospitalized with pneumonia.  Please continue levofloxacin 750mg  every night, next dose this evening, until all the pills are gone (total of 7-days including time spent in the hospital).  Please follow up with your primary care doctor in 1 week for reevaluation and to determine if you still need home oxygen.  You may continue all of your other home medications, but please talk to your primary care doctor to make sure they are monitoring your  kidneys while you are on naproxen.  If you have fevers, chills, difficulty breathing, chest pains, fainting spells, call 911 or come to the nearest hospital right away.     Increase activity slowly    Complete by:  As directed             Medication List    TAKE these medications        ALPRAZolam 0.5 MG tablet  Commonly known as:  XANAX  Take 0.5 mg by mouth daily as needed. Panic attacks/anxiety.     aspirin EC 81 MG tablet  Take 81 mg by mouth daily.     atorvastatin 80 MG  tablet  Commonly known as:  LIPITOR  Take 1 tablet (80 mg total) by mouth daily at 6 PM.     furosemide 20 MG tablet  Commonly known as:  LASIX  Take 20 mg by mouth daily.     glucosamine-chondroitin 500-400 MG tablet  Take 1 tablet by mouth 3 (three) times daily.     levofloxacin 750 MG tablet  Commonly known as:  LEVAQUIN  Take 1 tablet (750 mg total) by mouth daily.     metoprolol tartrate 25 MG tablet  Commonly known as:  LOPRESSOR  Take 0.5 tablets (12.5 mg total) by mouth 2 (two) times daily.     naproxen sodium 220 MG tablet  Commonly known as:  ANAPROX  Take 440 mg by mouth 2 (two) times daily with a meal.       Follow-up Information    Follow up with Leonides Grills, MD. Schedule an appointment as soon as possible for a visit in 1 week.   Specialty:  Family Medicine   Contact information:   [address] [place] [address] [phone]        The results of significant diagnostics from this hospitalization (including imaging, microbiology, ancillary and laboratory) are listed below for reference.    Significant Diagnostic Studies: Ct Angio Chest Pe W/cm &/or Wo Cm  06/17/2015   CLINICAL DATA:  Shortness of breath beginning 06/14/2015, worsening. Elevated white blood cell count.  EXAM: CT ANGIOGRAPHY CHEST WITH CONTRAST  TECHNIQUE: Multidetector CT imaging of the chest was performed using the standard protocol during bolus administration of intravenous contrast.  Multiplanar CT image reconstructions and MIPs were obtained to evaluate the vascular anatomy.  CONTRAST:  100 mL OMNIPAQUE IOHEXOL 350 MG/ML SOLN  COMPARISON:  Plain film of the chest earlier this same day. PA and lateral chest 12/11/2014.  FINDINGS: This study is somewhat limited by bolus timing. No central pulmonary embolus is identified. There is no evidence of right heart strain. The patient is status post CABG. Cardiomegaly is noted. No pleural or pericardial effusion. The thyroid gland is somewhat heterogeneous. No axillary, hilar or mediastinal lymphadenopathy. Extensive bilateral airspace disease identified throughout both lungs.  Visualized upper abdomen shows fatty infiltration of the liver. The patient is status post cholecystectomy. No focal bony abnormality is identified.  Review of the MIP images confirms the above findings.  IMPRESSION: Somewhat technically limited study without evidence of pulmonary edema.  Extensive bilateral airspace disease likely reflects multifocal pneumonia in this patient with an elevated white blood cell count. Pulmonary edema could create a similar appearance.  Cardiomegaly.  Fatty infiltration the liver.   Electronically Signed   By: Inge Rise M.D.   On: 06/17/2015 22:35   Dg Chest Port 1 View  06/20/2015   CLINICAL DATA:  Pneumonia  EXAM: PORTABLE CHEST - 1 VIEW  COMPARISON:  Chest radiograph and chest CT June 17, 2015  FINDINGS: There is generalized interstitial edema, essentially stable. There is no appreciable airspace consolidation. Heart is mildly enlarged with pulmonary venous hypertension. No adenopathy. Patient is status post coronary artery bypass grafting.  IMPRESSION: Evidence a degree of congestive heart failure, stable. Superimposed interstitial pneumonitis of infectious etiology is a differential consideration ; both entities may exist concurrently. No new opacity.   Electronically Signed   By: Lowella Grip III M.D.   On: 06/20/2015 08:01    Dg Chest Port 1 View  06/17/2015   CLINICAL DATA:  Shortness of breath since this afternoon.  EXAM: PORTABLE  CHEST - 1 VIEW  COMPARISON:  Frontal and lateral views 12/11/2014  FINDINGS: Patient is post median sternotomy and CABG. Stable cardiac enlargement allowing for differences in technique. Vascular congestion without overt pulmonary edema. No large pleural effusion. No confluent airspace disease. No pneumothorax.  IMPRESSION: Stable cardiomegaly.  Mild vascular congestion.   Electronically Signed   By: Jeb Levering M.D.   On: 06/17/2015 19:42    Microbiology: Recent Results (from the past 240 hour(s))  Culture, blood (routine x 2)     Status: None (Preliminary result)   Collection Time: 06/18/15  9:52 AM  Result Value Ref Range Status   Specimen Description BLOOD RIGHT ANTECUBITAL  Final   Special Requests BOTTLES DRAWN AEROBIC AND ANAEROBIC [place]  Final   Culture NO GROWTH < 24 HOURS  Final   Report Status PENDING  Incomplete  Culture, blood (routine x 2)     Status: None (Preliminary result)   Collection Time: 06/18/15 10:05 AM  Result Value Ref Range Status   Specimen Description BLOOD LEFT ARM  Final   Special Requests   Final    BOTTLES DRAWN AEROBIC AND ANAEROBIC AEB=6CC ANA=7CC   Culture NO GROWTH < 24 HOURS  Final   Report Status PENDING  Incomplete     Labs: Basic Metabolic Panel:  Recent Labs Lab 06/17/15 1905 06/18/15 0601 06/19/15 0613 06/20/15 0652  NA 141 142 142 141  K 3.2* 3.4* 3.3* 3.9  CL 108 106 108 109  CO2 21* 25 26 25   GLUCOSE 126* 101* 107* 108*  BUN 23* 24* 19 20  CREATININE 1.23* 1.39* 1.22* 1.22*  CALCIUM 8.6* 7.9* 8.2* 8.6*   Liver Function Tests:  Recent Labs Lab 06/18/15 0601  AST 34  ALT 33  ALKPHOS 63  BILITOT 1.1  PROT 6.3*  ALBUMIN 3.0*   No results for input(s): LIPASE, AMYLASE in the last 168 hours. No results for input(s): AMMONIA in the last 168 hours. CBC:  Recent Labs Lab 06/17/15 1905 06/18/15 0601  06/19/15 0613 06/20/15 0652  WBC 19.3* 20.4* 11.0* 8.4  NEUTROABS 18.0*  --   --   --   HGB 11.7* 10.6* 10.0* 10.2*  HCT 36.9 33.7* 33.0* 32.9*  MCV 81.5 81.0 82.5 82.5  PLT 293 322 288 308   Cardiac Enzymes:  Recent Labs Lab 06/17/15 1905  TROPONINI <0.03   BNP: BNP (last 3 results)  Recent Labs  06/17/15 1920  BNP 54.0    ProBNP (last 3 results) No results for input(s): PROBNP in the last 8760 hours.  CBG:  Recent Labs Lab 06/19/15 0726 06/19/15 1140 06/19/15 1624 06/19/15 2150 06/20/15 0726  GLUCAP 103* 94 100* 111* 92    Time coordinating discharge: 35 minutes  Signed:  Lakaisha Danish  [facility] 06/20/2015, 9:07 AM

## 2015-06-20 NOTE — Progress Notes (Addendum)
IV removed, pt tolerated well.  Reviewed discharge instructions with pt including HF packet, gave pt a scale to weigh daily, answered all questions at this time.  Will continue to monitor until pt leaves the floor.

## 2015-06-20 NOTE — Progress Notes (Addendum)
SATURATION QUALIFICATIONS: (This note is used to comply with regulatory documentation for home oxygen)  Patient Saturations on Room Air at Rest = 100%  Patient Saturations on Room Air while Ambulating = 91%  Patient Saturations on 2 Liters of oxygen while Ambulating = 100%  Please briefly explain why patient needs home oxygen:

## 2015-06-21 LAB — HEMOGLOBIN A1C
Hgb A1c MFr Bld: 6.5 % — ABNORMAL HIGH (ref 4.8–5.6)
Mean Plasma Glucose: 140 mg/dL

## 2015-06-23 LAB — CULTURE, BLOOD (ROUTINE X 2)
CULTURE: NO GROWTH
Culture: NO GROWTH

## 2015-07-02 ENCOUNTER — Encounter: Payer: Self-pay | Admitting: Adult Health

## 2015-07-02 ENCOUNTER — Encounter: Payer: Self-pay | Admitting: *Deleted

## 2015-07-02 ENCOUNTER — Ambulatory Visit (INDEPENDENT_AMBULATORY_CARE_PROVIDER_SITE_OTHER): Payer: No Typology Code available for payment source | Admitting: Adult Health

## 2015-07-02 VITALS — BP 132/74 | HR 67 | Ht 63.0 in | Wt 217.4 lb

## 2015-07-02 DIAGNOSIS — M79604 Pain in right leg: Secondary | ICD-10-CM

## 2015-07-02 DIAGNOSIS — M79605 Pain in left leg: Secondary | ICD-10-CM

## 2015-07-02 DIAGNOSIS — E785 Hyperlipidemia, unspecified: Secondary | ICD-10-CM | POA: Diagnosis not present

## 2015-07-02 DIAGNOSIS — M79606 Pain in leg, unspecified: Secondary | ICD-10-CM

## 2015-07-02 MED ORDER — TRAMADOL HCL 50 MG PO TABS: ORAL_TABLET | ORAL | Status: DC

## 2015-07-02 NOTE — Progress Notes (Signed)
Cardiology Office Note   Date:  07/02/2015   ID:  Karina, Green January 02, 1956, MRN [id number]  PCP:  Leonides Grills, MD  Cardiologist:  Cloria Spring, NP   Chief Complaint  Patient presents with  . Coronary Artery Disease  . Hypertension  . Hyperlipidemia      History of Present Illness: Karina Green is a 59 y.o. female who presents for ongoing assessment and management of CAD, hypertension and hyperlipidemia. She was last seen by Dr. Harl Bowie in May of 2016 and was stable from cardiac standpoint. She was given ok to return to work and ASA was reduced to 81 mg daily. Echocardiogram was completed in 05/2015 deomonstrating nomral EF of 60-65% with moderate LVH and Grade I diastolic dysfunction. Mildly calcified aortic stenosis. Trivial tricuspid regurg.   She unfortunately was admitted in late June 2016 for CAP, with mild CHF. She was treated for sepsis and sent home on ongoing abx for a further 7 days. We are seeing her for ongoing evaluation post hospitalization.   She states that she is still tired, but is feeling better. States that she has not gone back to work yet. She is having complaints of left leg pain, aching, throbbing pain in the calvf after work. She states she sometimes cannot sleep when it occurs. She denies numbness, tingling, or cold feeling. It is unilateral pain. This is the same leg that she had vein harvest.    Past Medical History  Diagnosis Date  . Hypertension   . Back pain   . Arthritis   . Hyperlipidemia   . CAD (coronary artery disease), native coronary artery-70% LM stenosis 10/25/2014  . DM type 2 (diabetes mellitus, type 2) 10/25/14  . Gout     Past Surgical History  Procedure Laterality Date  . Tonsillectomy    . Bladder tack    . Total hip arthroplasty    . Hernia repair    . Cholecystectomy    . Abdominal hysterectomy    . Appendectomy    . Cardiac catheterization  10/25/14    70% LM stensis, normal EF  . Coronary artery bypass  graft N/A 10/29/2014    Procedure: CORONARY ARTERY BYPASS GRAFTING (CABG) TIMES TWO USING LEFT INTERNAL MAMMARY ARTERY AND RIGHT LEG GREATER SAPHENOUS VEIN HARVESTED ENDOSCOPICALLY.;  Surgeon: Ivin Poot, MD;  Location: [facility];  Service: Open Heart Surgery;  Laterality: N/A;  . Intraoperative transesophageal echocardiogram N/A 10/29/2014    Procedure: INTRAOPERATIVE TRANSESOPHAGEAL ECHOCARDIOGRAM;  Surgeon: Ivin Poot, MD;  Location: [facility];  Service: Open Heart Surgery;  Laterality: N/A;  . Left heart catheterization with coronary angiogram N/A 10/26/2014    Procedure: LEFT HEART CATHETERIZATION WITH CORONARY ANGIOGRAM;  Surgeon: Troy Sine, MD;  Location: [facility] CATH LAB;  Service: Cardiovascular;  Laterality: N/A;     Current Outpatient Prescriptions  Medication Sig Dispense Refill  . ALPRAZolam (XANAX) 0.5 MG tablet Take 0.5 mg by mouth daily as needed. Panic attacks/anxiety.    Marland Kitchen aspirin EC 81 MG tablet Take 81 mg by mouth daily.    Marland Kitchen atorvastatin (LIPITOR) 80 MG tablet Take 1 tablet (80 mg total) by mouth daily at 6 PM. 90 tablet 3  . furosemide (LASIX) 20 MG tablet Take 20 mg by mouth daily.    Marland Kitchen glucosamine-chondroitin 500-400 MG tablet Take 1 tablet by mouth 3 (three) times daily.    Marland Kitchen levofloxacin (LEVAQUIN) 750 MG tablet Take 1 tablet (750 mg total) by mouth daily. 4  tablet 0  . metoprolol tartrate (LOPRESSOR) 25 MG tablet Take 0.5 tablets (12.5 mg total) by mouth 2 (two) times daily. 60 tablet 3  . naproxen sodium (ANAPROX) 220 MG tablet Take 440 mg by mouth 2 (two) times daily with a meal.     No current facility-administered medications for this visit.    Allergies:   Review of patient's allergies indicates no known allergies.    Social History:  The patient  reports that she has never smoked. She has never used smokeless tobacco. She reports that she does not drink alcohol or use illicit drugs.   Family History:  The patient's family history includes CAD in her father;  Coronary artery disease in her brother and mother; Hypertension in her father and mother.    ROS: .   All other systems are reviewed and negative.Unless otherwise mentioned in H&P above.   PHYSICAL EXAM: VS:  There were no vitals taken for this visit. , BMI There is no weight on file to calculate BMI. GEN: Well nourished, well developed, in no acute distress HEENT: normal Neck: no JVD, carotid bruits, or masses Cardiac:RRR; no murmurs, rubs, or gallops,no edema  Well healed sternotomy scar.  Respiratory:  Cear to auscultation bilaterally, normal work of breathing.Upper throat scratchy cough lingering since pneumonia.  GI: soft, nontender, nondistended, + BS MS: no deformity or atrophyDP 1+ bilaterally.  Skin: warm and dry, no rash Neuro:  Strength and sensation are intact Psych: euthymic mood, full affect  Recent Labs: 10/30/2014: Magnesium 2.0 06/17/2015: B Natriuretic Peptide 54.0 06/18/2015: ALT 33 06/20/2015: BUN 20; Creatinine, Ser 1.22*; Hemoglobin 10.2*; Platelets 308; Potassium 3.9; Sodium 141    Lipid Panel    Component Value Date/Time   CHOL 177 10/25/2014 0407   TRIG 235* 10/25/2014 0407   HDL 30* 10/25/2014 0407   CHOLHDL 5.9 10/25/2014 0407   VLDL 47* 10/25/2014 0407   LDLCALC 100* 10/25/2014 0407      Wt Readings from Last 3 Encounters:  06/18/15 226 lb 8 oz (102.74 kg)  04/30/15 223 lb (101.152 kg)  02/22/15 214 lb (97.07 kg)      Other studies Reviewed: Additional studies/ records that were reviewed today include: None Review of the above records demonstrates: N/A   ASSESSMENT AND PLAN:  1. CAD: Status post coronary artery bypass grafting in November of 2015 with limited LAD and SVG to OM.  She has preserved left ventricular function.  Some diastolic dysfunction.  She has been complaining of left calf pain, described as aching and throbbing after work.  She does not claim that it hurts while she is walking on it usually at the end of the day.  She  denies any coolness in the extremity, discoloration or numbness.  It is in the extremity, where the SVG harvest was completed.  There are will check bilateral ABIs with sequential compression to evaluate for her for PADhe has borderline symptoms of intermittent claudication, although not typical.I have provided her a prescription for tramadol 50 mg, one by mouth every 6 hours when necessary pain.  It ABIs are normal.  I have advised to followup with her primary care to evaluate for lumbar spine or other musculoskeletal issues, which may be causing her pain  I have also provided a letter for work for allowing her to return either full-time.  Her part-time depending upon her stamina.  It is suggested that she go part-time until she regains her strength.she will continue beta blocker, and statin.  Fasting  lipids and LFTs will be drawn.  She may have them done at the health Department as her insurance has lapsed.  She is given refills on metoprolol.  2. Hypertension: blood pressure is currently well controlled.  She is not on an ACE inhibitor secondary to history of chronic kidney disease, stage I-2.  I will continue her on metoprolol. Review of labs during hospitalization demonstrated potassium of 3.9, and sodium 141.  Her creatinine was 1.39.  3. Diabetes: she will need to follow with primary care or health department for ongoing management.  She states her insurance labs while she was in the hospital.  She may need to be followed by free clinic or health Department until insurance reinstituted.     Current medicines are reviewed at length with the patient today.    Labs/ tests ordered today include:Lipids and LFT's. ABI bilateral  No orders of the defined types were placed in this encounter.     Disposition:   FU with 6 months.  Signed, Jory Sims, NP  07/02/2015 7:29 AM    [facility] [address] [place], [place] [address] Phone: [phone]; Fax: [phone]

## 2015-07-02 NOTE — Patient Instructions (Signed)
Your physician wants you to follow-up in: 6 months. You will receive a reminder letter in the mail two months in advance. If you don't receive a letter, please call our office to schedule the follow-up appointment.  Your physician recommends that you continue on your current medications as directed. Please refer to the Current Medication list given to you today.  Start Tramadol 50 mg 1 Tablet by mouth every 6 hours as needed for leg pain.   Your physician has requested that you have an ankle brachial index (ABI). During this test an ultrasound and blood pressure cuff are used to evaluate the arteries that supply the arms and legs with blood. Allow thirty minutes for this exam. There are no restrictions or special instructions.  Your physician recommends that you return for lab work in: Fasting  Thank you for choosing [facility]!

## 2015-07-02 NOTE — Progress Notes (Deleted)
Name: Karina Green    DOB: 09-Oct-1956  Age: 59 y.o.  MR#: [id number]       PCP:  Ages: Payor: [employer] MULTIPLAN / Plan: MULTIPLAN [employer]CS / Product Type: *No Product type* /   CC:    Chief Complaint  Patient presents with  . Coronary Artery Disease  . Hypertension  . Hyperlipidemia    VS Filed Vitals:   07/02/15 1338  BP: 132/74  Pulse: 67  Height: 5\' 3"  (1.6 m)  Weight: 217 lb 6.4 oz (98.612 kg)  SpO2: 97%    Weights Current Weight  07/02/15 217 lb 6.4 oz (98.612 kg)  06/18/15 226 lb 8 oz (102.74 kg)  04/30/15 223 lb (101.152 kg)    Blood Pressure  BP Readings from Last 3 Encounters:  07/02/15 132/74  06/20/15 137/62  04/30/15 132/70     Admit date:  (Not on file) Last encounter with RMR:  04/09/2015   Allergy Review of patient's allergies indicates no known allergies.  Current Outpatient Prescriptions  Medication Sig Dispense Refill  . ALPRAZolam (XANAX) 0.5 MG tablet Take 0.5 mg by mouth daily as needed. Panic attacks/anxiety.    Marland Kitchen aspirin EC 81 MG tablet Take 81 mg by mouth daily.    Marland Kitchen atorvastatin (LIPITOR) 80 MG tablet Take 1 tablet (80 mg total) by mouth daily at 6 PM. 90 tablet 3  . furosemide (LASIX) 20 MG tablet Take 20 mg by mouth daily.    Marland Kitchen glucosamine-chondroitin 500-400 MG tablet Take 1 tablet by mouth 3 (three) times daily.    Marland Kitchen levofloxacin (LEVAQUIN) 750 MG tablet Take 1 tablet (750 mg total) by mouth daily. 4 tablet 0  . metoprolol tartrate (LOPRESSOR) 25 MG tablet Take 0.5 tablets (12.5 mg total) by mouth 2 (two) times daily. 60 tablet 3  . naproxen sodium (ANAPROX) 220 MG tablet Take 440 mg by mouth 2 (two) times daily with a meal.     No current facility-administered medications for this visit.    Discontinued Meds:   There are no discontinued medications.  Patient Active Problem List   Diagnosis Date Noted  . Chronic diastolic heart failure 71/24/5809  . Aortic valve stenosis, mild 06/20/2015  . CKD  (chronic kidney disease), stage III 06/20/2015  . Hypokalemia 06/20/2015  . Sepsis 06/20/2015  . Acute respiratory failure with hypoxia 06/20/2015  . CAP (community acquired pneumonia) 06/17/2015  . CAD (coronary artery disease), native coronary artery-70% LM stenosis 10/26/2014  . DM2 (diabetes mellitus, type 2) 10/26/2014  . Unstable angina 10/25/2014  . Essential hypertension 10/24/2014  . Hypercholesteremia 10/24/2014    LABS    Component Value Date/Time   NA 141 06/20/2015 0652   NA 142 06/19/2015 0613   NA 142 06/18/2015 0601   K 3.9 06/20/2015 0652   K 3.3* 06/19/2015 0613   K 3.4* 06/18/2015 0601   CL 109 06/20/2015 0652   CL 108 06/19/2015 0613   CL 106 06/18/2015 0601   CO2 25 06/20/2015 0652   CO2 26 06/19/2015 0613   CO2 25 06/18/2015 0601   GLUCOSE 108* 06/20/2015 0652   GLUCOSE 107* 06/19/2015 0613   GLUCOSE 101* 06/18/2015 0601   BUN 20 06/20/2015 0652   BUN 19 06/19/2015 0613   BUN 24* 06/18/2015 0601   CREATININE 1.22* 06/20/2015 0652   CREATININE 1.22* 06/19/2015 0613   CREATININE 1.39* 06/18/2015 0601   CALCIUM 8.6* 06/20/2015 0652   CALCIUM 8.2* 06/19/2015  4315   CALCIUM 7.9* 06/18/2015 0601   GFRNONAA 47* 06/20/2015 0652   GFRNONAA 47* 06/19/2015 0613   GFRNONAA 40* 06/18/2015 0601   GFRAA 54* 06/20/2015 0652   GFRAA 54* 06/19/2015 0613   GFRAA 46* 06/18/2015 0601   CMP     Component Value Date/Time   NA 141 06/20/2015 0652   K 3.9 06/20/2015 0652   CL 109 06/20/2015 0652   CO2 25 06/20/2015 0652   GLUCOSE 108* 06/20/2015 0652   BUN 20 06/20/2015 0652   CREATININE 1.22* 06/20/2015 0652   CALCIUM 8.6* 06/20/2015 0652   PROT 6.3* 06/18/2015 0601   ALBUMIN 3.0* 06/18/2015 0601   AST 34 06/18/2015 0601   ALT 33 06/18/2015 0601   ALKPHOS 63 06/18/2015 0601   BILITOT 1.1 06/18/2015 0601   GFRNONAA 47* 06/20/2015 0652   GFRAA 54* 06/20/2015 0652       Component Value Date/Time   WBC 8.4 06/20/2015 0652   WBC 11.0* 06/19/2015 0613    WBC 20.4* 06/18/2015 0601   HGB 10.2* 06/20/2015 0652   HGB 10.0* 06/19/2015 0613   HGB 10.6* 06/18/2015 0601   HCT 32.9* 06/20/2015 0652   HCT 33.0* 06/19/2015 0613   HCT 33.7* 06/18/2015 0601   MCV 82.5 06/20/2015 0652   MCV 82.5 06/19/2015 0613   MCV 81.0 06/18/2015 0601    Lipid Panel     Component Value Date/Time   CHOL 177 10/25/2014 0407   TRIG 235* 10/25/2014 0407   HDL 30* 10/25/2014 0407   CHOLHDL 5.9 10/25/2014 0407   VLDL 47* 10/25/2014 0407   LDLCALC 100* 10/25/2014 0407    ABG    Component Value Date/Time   PHART 7.365 10/29/2014 2020   PCO2ART 38.6 10/29/2014 2020   PO2ART 83.0 10/29/2014 2020   HCO3 22.2 10/29/2014 2020   TCO2 21 10/30/2014 1610   ACIDBASEDEF 3.0* 10/29/2014 2020   O2SAT 96.0 10/29/2014 2020     No results found for: TSH BNP (last 3 results)  Recent Labs  06/17/15 1920  BNP 54.0    ProBNP (last 3 results) No results for input(s): PROBNP in the last 8760 hours.  Cardiac Panel (last 3 results) No results for input(s): CKTOTAL, CKMB, TROPONINI, RELINDX in the last 72 hours.  Iron/TIBC/Ferritin/ %Sat    Component Value Date/Time   IRON 70 10/26/2014 1052   TIBC 253 10/26/2014 1052   FERRITIN 73 10/26/2014 1052   IRONPCTSAT 28 10/26/2014 1052     EKG Orders placed or performed during the hospital encounter of 06/17/15  . EKG 12-Lead  . EKG 12-Lead  . EKG     Prior Assessment and Plan Problem List as of 07/02/2015    Essential hypertension   Last Assessment & Plan 01/14/2015 Office Visit Written 01/14/2015  1:49 PM by Lendon Colonel, NP    Blood pressure is controlled. May consider adding low dose ACE on follow up if BP goes up after stopping amiodarone.       Hypercholesteremia   Last Assessment & Plan 01/14/2015 Office Visit Written 01/14/2015  1:50 PM by Lendon Colonel, NP    Counseled on low cholesterol diet and to increase exercise. Continue lovastatin. Will recheck status on next office visit.        Unstable angina   CAD (coronary artery disease), native coronary artery-70% LM stenosis   Last Assessment & Plan 01/14/2015 Office Visit Written 01/14/2015  1:48 PM by Lendon Colonel, NP    I will stop  the amiodarone now as she is no longer in pain from gout and is having some dizziness. This may be related to lower HR on amiodarone. Continue metoprolol as directed. Will give her information for HR at work to allow her to be out for a total of 3 months with date of Feb 11 th as 3 month mark. She will be seen shortly after that to be released back to work.       DM2 (diabetes mellitus, type 2)   CAP (community acquired pneumonia)   Chronic diastolic heart failure   Aortic valve stenosis, mild   CKD (chronic kidney disease), stage III   Hypokalemia   Sepsis   Acute respiratory failure with hypoxia       Imaging: Ct Angio Chest Pe W/cm &/or Wo Cm  06/17/2015   CLINICAL DATA:  Shortness of breath beginning 06/14/2015, worsening. Elevated white blood cell count.  EXAM: CT ANGIOGRAPHY CHEST WITH CONTRAST  TECHNIQUE: Multidetector CT imaging of the chest was performed using the standard protocol during bolus administration of intravenous contrast. Multiplanar CT image reconstructions and MIPs were obtained to evaluate the vascular anatomy.  CONTRAST:  100 mL OMNIPAQUE IOHEXOL 350 MG/ML SOLN  COMPARISON:  Plain film of the chest earlier this same day. PA and lateral chest 12/11/2014.  FINDINGS: This study is somewhat limited by bolus timing. No central pulmonary embolus is identified. There is no evidence of right heart strain. The patient is status post CABG. Cardiomegaly is noted. No pleural or pericardial effusion. The thyroid gland is somewhat heterogeneous. No axillary, hilar or mediastinal lymphadenopathy. Extensive bilateral airspace disease identified throughout both lungs.  Visualized upper abdomen shows fatty infiltration of the liver. The patient is status post cholecystectomy. No focal bony  abnormality is identified.  Review of the MIP images confirms the above findings.  IMPRESSION: Somewhat technically limited study without evidence of pulmonary edema.  Extensive bilateral airspace disease likely reflects multifocal pneumonia in this patient with an elevated white blood cell count. Pulmonary edema could create a similar appearance.  Cardiomegaly.  Fatty infiltration the liver.   Electronically Signed   By: Inge Rise M.D.   On: 06/17/2015 22:35   Dg Chest Port 1 View  06/20/2015   CLINICAL DATA:  Pneumonia  EXAM: PORTABLE CHEST - 1 VIEW  COMPARISON:  Chest radiograph and chest CT June 17, 2015  FINDINGS: There is generalized interstitial edema, essentially stable. There is no appreciable airspace consolidation. Heart is mildly enlarged with pulmonary venous hypertension. No adenopathy. Patient is status post coronary artery bypass grafting.  IMPRESSION: Evidence a degree of congestive heart failure, stable. Superimposed interstitial pneumonitis of infectious etiology is a differential consideration ; both entities may exist concurrently. No new opacity.   Electronically Signed   By: Lowella Grip III M.D.   On: 06/20/2015 08:01   Dg Chest Port 1 View  06/17/2015   CLINICAL DATA:  Shortness of breath since this afternoon.  EXAM: PORTABLE CHEST - 1 VIEW  COMPARISON:  Frontal and lateral views 12/11/2014  FINDINGS: Patient is post median sternotomy and CABG. Stable cardiac enlargement allowing for differences in technique. Vascular congestion without overt pulmonary edema. No large pleural effusion. No confluent airspace disease. No pneumothorax.  IMPRESSION: Stable cardiomegaly.  Mild vascular congestion.   Electronically Signed   By: Jeb Levering M.D.   On: 06/17/2015 19:42

## 2015-07-05 ENCOUNTER — Ambulatory Visit (HOSPITAL_COMMUNITY)
Admission: RE | Admit: 2015-07-05 | Discharge: 2015-07-05 | Disposition: A | Discharge status: Home / Self Care | Payer: No Typology Code available for payment source | Source: Ambulatory Visit | Attending: Adult Health | Admitting: Adult Health

## 2015-07-05 DIAGNOSIS — M79604 Pain in right leg: Secondary | ICD-10-CM

## 2015-07-05 DIAGNOSIS — I739 Peripheral vascular disease, unspecified: Secondary | ICD-10-CM | POA: Diagnosis present

## 2015-07-05 DIAGNOSIS — M79605 Pain in left leg: Secondary | ICD-10-CM

## 2015-07-11 ENCOUNTER — Telehealth: Payer: Self-pay | Admitting: Adult Health

## 2015-07-11 NOTE — Telephone Encounter (Signed)
Letter reprinted and mailed to patient as it states reason why she may need to work part time

## 2015-07-11 NOTE — Telephone Encounter (Signed)
Patient states she needs letter work stating why she can only work part time per [employer]. / tg

## 2015-07-11 NOTE — Telephone Encounter (Signed)
A letter was written on July 12th with explanation. You may want to print it out and send it by snail mail.

## 2015-08-14 ENCOUNTER — Other Ambulatory Visit: Payer: Self-pay | Admitting: *Deleted

## 2015-08-14 MED ORDER — METOPROLOL TARTRATE 25 MG PO TABS: 12.5000 mg | ORAL_TABLET | Freq: Two times a day (BID) | ORAL | Status: DC

## 2015-12-26 ENCOUNTER — Other Ambulatory Visit: Payer: Self-pay | Admitting: Adult Health

## 2016-03-27 ENCOUNTER — Ambulatory Visit (INDEPENDENT_AMBULATORY_CARE_PROVIDER_SITE_OTHER): Payer: BLUE CROSS/BLUE SHIELD | Admitting: Cardiology

## 2016-03-27 ENCOUNTER — Encounter: Payer: Self-pay | Admitting: Cardiology

## 2016-03-27 VITALS — BP 132/66 | HR 84 | Ht 63.0 in | Wt 228.0 lb

## 2016-03-27 DIAGNOSIS — E785 Hyperlipidemia, unspecified: Secondary | ICD-10-CM | POA: Diagnosis not present

## 2016-03-27 DIAGNOSIS — R0989 Other specified symptoms and signs involving the circulatory and respiratory systems: Secondary | ICD-10-CM | POA: Diagnosis not present

## 2016-03-27 DIAGNOSIS — I10 Essential (primary) hypertension: Secondary | ICD-10-CM | POA: Diagnosis not present

## 2016-03-27 DIAGNOSIS — I2510 Atherosclerotic heart disease of native coronary artery without angina pectoris: Secondary | ICD-10-CM | POA: Diagnosis not present

## 2016-03-27 MED ORDER — LOSARTAN POTASSIUM 25 MG PO TABS: 12.5000 mg | ORAL_TABLET | Freq: Every day | ORAL | Status: DC

## 2016-03-27 MED ORDER — METOPROLOL TARTRATE 25 MG PO TABS: 12.5000 mg | ORAL_TABLET | Freq: Two times a day (BID) | ORAL | Status: DC

## 2016-03-27 MED ORDER — FUROSEMIDE 20 MG PO TABS: 20.0000 mg | ORAL_TABLET | Freq: Every day | ORAL | Status: AC

## 2016-03-27 MED ORDER — ATORVASTATIN CALCIUM 80 MG PO TABS: ORAL_TABLET | ORAL | Status: DC

## 2016-03-27 NOTE — Patient Instructions (Signed)
Your physician wants you to follow-up in: 6 months You will receive a reminder letter in the mail two months in advance. If you don't receive a letter, please call our office to schedule the follow-up appointment   START Losartan 12.5 mg daily  Get FASTING Lab work   Your physician has requested that you have a carotid duplex. This test is an ultrasound of the carotid arteries in your neck. It looks at blood flow through these arteries that supply the brain with blood. Allow one hour for this exam. There are no restrictions or special instructions.    Thank you for choosing [facility] !

## 2016-03-27 NOTE — Progress Notes (Addendum)
Patient ID: Karina Green, female   DOB: 1956/02/11, 60 y.o.   MRN: [id number]     Clinical Summary Ms. Karina Green is a 60 y.o.female seen for the following medical problems.  1. CAD - hx of CABG Nov 2015 (LIMA-LAD,SVG-OM) - 10/2014 echo LVEF 60-65%, abnormal diastolic function indeterminant grade,   - no recent chest pain. No SOB or DOE - compliant with meds  2. HTN - does not check at home reguarly - compliant with meds. ACE-I caused cough in the past, has not been on ARB.    3. Hyperlipidemia 07/2015 TC 176 TG 252 HDL 36 LDL 91.  - reports upcoming panel with pcp  4. Preoperative evaluation - considering left hip replacement by Dr Karina Green - limited exertion by hip pain.   5. Aortic stenosis - echo 05/2015 mean grad 16, AVA 1.5. Overall mild AS - denies any significant symptoms   Past Medical History  Diagnosis Date  . Hypertension   . Back pain   . Arthritis   . Hyperlipidemia   . CAD (coronary artery disease), native coronary artery-70% LM stenosis 10/25/2014  . DM type 2 (diabetes mellitus, type 2) 10/25/14  . Gout      No Known Allergies   Current Outpatient Prescriptions  Medication Sig Dispense Refill  . ALPRAZolam (XANAX) 0.5 MG tablet Take 0.5 mg by mouth daily as needed. Panic attacks/anxiety.    Marland Kitchen aspirin EC 81 MG tablet Take 81 mg by mouth daily.    Marland Kitchen atorvastatin (LIPITOR) 80 MG tablet TAKE ONE TABLET BY MOUTH ONCE DAILY AT 6 P.M. 90 tablet 01  . furosemide (LASIX) 20 MG tablet Take 20 mg by mouth daily.    Marland Kitchen glucosamine-chondroitin 500-400 MG tablet Take 1 tablet by mouth 3 (three) times daily.    Marland Kitchen levofloxacin (LEVAQUIN) 750 MG tablet Take 1 tablet (750 mg total) by mouth daily. 4 tablet 0  . metoprolol tartrate (LOPRESSOR) 25 MG tablet Take 0.5 tablets (12.5 mg total) by mouth 2 (two) times daily. 60 tablet 3  . naproxen sodium (ANAPROX) 220 MG tablet Take 440 mg by mouth 2 (two) times daily with a meal.    . traMADol (ULTRAM) 50 MG tablet TAKE 1  TABLET (50 MG) BY MOUTH EVERY 6 HOURS AS NEEDED FOR LEG PAIN 20 tablet 0   No current facility-administered medications for this visit.     Past Surgical History  Procedure Laterality Date  . Tonsillectomy    . Bladder tack    . Total hip arthroplasty    . Hernia repair    . Cholecystectomy    . Abdominal hysterectomy    . Appendectomy    . Cardiac catheterization  10/25/14    70% LM stensis, normal EF  . Coronary artery bypass graft N/A 10/29/2014    Procedure: CORONARY ARTERY BYPASS GRAFTING (CABG) TIMES TWO USING LEFT INTERNAL MAMMARY ARTERY AND RIGHT LEG GREATER SAPHENOUS VEIN HARVESTED ENDOSCOPICALLY.;  Surgeon: Karina Poot, MD;  Location: [place];  Service: Open Heart Surgery;  Laterality: N/A;  . Intraoperative transesophageal echocardiogram N/A 10/29/2014    Procedure: INTRAOPERATIVE TRANSESOPHAGEAL ECHOCARDIOGRAM;  Surgeon: Karina Poot, MD;  Location: [place];  Service: Open Heart Surgery;  Laterality: N/A;  . Left heart catheterization with coronary angiogram N/A 10/26/2014    Procedure: LEFT HEART CATHETERIZATION WITH CORONARY ANGIOGRAM;  Surgeon: Karina Sine, MD;  Location: [facility] CATH LAB;  Service: Cardiovascular;  Laterality: N/A;     No Known Allergies  Family History  Problem Relation Age of Onset  . Hypertension Mother   . Hypertension Father   . Coronary artery disease Mother     CABG  . CAD Father     CABG  . Coronary artery disease Brother     Stent     Social History Ms. Karina Green reports that she has never smoked. She has never used smokeless tobacco. Ms. Karina Green reports that she does not drink alcohol.   Review of Systems CONSTITUTIONAL: No weight loss, fever, chills, weakness or fatigue.  HEENT: Eyes: No visual loss, blurred vision, double vision or yellow sclerae.No hearing loss, sneezing, congestion, runny nose or sore throat.  SKIN: No rash or itching.  CARDIOVASCULAR: per HPI RESPIRATORY: No shortness of breath, cough or sputum.    GASTROINTESTINAL: No anorexia, nausea, vomiting or diarrhea. No abdominal pain or blood.  GENITOURINARY: No burning on urination, no polyuria NEUROLOGICAL: No headache, dizziness, syncope, paralysis, ataxia, numbness or tingling in the extremities. No change in bowel or bladder control.  MUSCULOSKELETAL: No muscle, back pain, joint pain or stiffness.  LYMPHATICS: No enlarged nodes. No history of splenectomy.  PSYCHIATRIC: No history of depression or anxiety.  ENDOCRINOLOGIC: No reports of sweating, cold or heat intolerance. No polyuria or polydipsia.  Marland Kitchen   Physical Examination Filed Vitals:   03/27/16 1434  BP: 132/66  Pulse: 84   Filed Vitals:   03/27/16 1434  Height: 5\' 3"  (1.6 m)  Weight: 228 lb (103.42 kg)    Gen: resting comfortably, no acute distress HEENT: no scleral icterus, pupils equal round and reactive, no palptable cervical adenopathy,  CV: RRR, 2/6 sysotlic murmur RUSB, no jvd. +bilateral carotid bruits Resp: Clear to auscultation bilaterally GI: abdomen is soft, non-tender, non-distended, normal bowel sounds, no hepatosplenomegaly MSK: extremities are warm, no edema.  Skin: warm, no rash Neuro:  no focal deficits Psych: appropriate affect   Diagnostic Studies 10/2014 Cath HEMODYNAMICS:   Central Aorta: 140/66  Left Ventricle: 140/18  ANGIOGRAPHY:   The left main coronary artery was a large-caliber vessel which bifurcated into the LAD and left circumflex coronary artery. There was a 70% distal left main stenosis prior to its bifurcation with a suggestion of a focal napkin ring like appearance. This did not improve following IC nitroglycerin administration.  The LAD was angiographically normal and gave rise to 2 major diagonal vessels and several septal perforating arteries. The vessel extended to the LV apex.   The left circumflex coronary artery was angiographically normal and gave rise to one major bifurcating obtuse marginal Karina Green.   The RCA was  angiographically normal it gave rise to a PDA and PLA vessel.   Left ventriculography revealed normal global LV contractility without focal segmental wall motion abnormalities. There was no evidence for mitral regurgitation. Ejection fraction was 60%.   Total contrast used: 85 cc Omnipaque  IMPRESSION:  Significant coronary obstructive disease with 70% distal left main stenosis.  Normal LV function.   RECOMMENDATION:  Surgical consultation for consideration for CABG.  10/2014 Echo Study Conclusions  - Left ventricle: The cavity size was normal. Wall thickness was increased in a pattern of mild LVH. Systolic function was normal. The estimated ejection fraction was in the range of 60% to 65%. Diastolic function is abnormal, indeterminant grade. Wall motion was normal; there were no regional wall motion abnormalities. - Aortic valve: Moderately calcified annulus. Trileaflet; mildly thickened leaflets. There is aortic sclerosis without significant stenosis. There was trivial regurgitation. Valve area (VTI): 1.79 cm^2. Valve  area (Vmax): 1.82 cm^2. - Mitral valve: Mildly calcified annulus. Mildly thickened leaflets . - Atrial septum: No defect or patent foramen ovale was identified. - Technically adequate study.  10/2014 Carotid US Summary:  - The vertebral arteries appear patent with antegrade flow. - Findings consistent with 1- 39 percent stenosis involving the right internal carotid artery and the left internal carotid artery. - ICA/CCA ratio. right = 0.81. left =1.36. - Palpable pedal pulses.  05/2015 echo Study Conclusions  - Left ventricle: The cavity size was normal. Wall thickness was  increased in a pattern of moderate LVH. Systolic function was  normal. The estimated ejection fraction was in the range of 60%  to 65%. Wall motion was normal; there were no regional wall  motion abnormalities. Doppler parameters are consistent with   abnormal left ventricular relaxation (grade 1 diastolic  dysfunction). - Aortic valve: Trileaflet; moderately calcified leaflets. Left  coronary cusp mobility was restricted. There was mild stenosis.  Mean gradient (S): 16 mm Hg. Peak gradient (S): 27 mm Hg. VTI  ratio of LVOT to aortic valve: 0.48. Valve area (VTI): 1.5 cm^2. - Mitral valve: Calcified annulus. There was trivial regurgitation. - Right atrium: Central venous pressure (est): 3 mm Hg. - Atrial septum: No defect or patent foramen ovale was identified. - Tricuspid valve: There was trivial regurgitation. - Pulmonary arteries: Systolic pressure could not be accurately  estimated. - Pericardium, extracardiac: There was no pericardial effusion.  Impressions:  - Moderate LVH with LVEF 60-65% and grade 1 diastolic dysfunction.  Mild calcific aortic stenosis as outlined. Trivial tricuspid  regurgitation, unable to assess PASP. Assessment and Plan  1. CAD - no current symptoms - continue current meds  2. HTN - bp at goal. In setting of CAD, DM2 and cough on ACE-I we will start losartan 12.5mg  daily for additional cardiovacular benefit. Check BMET in 2 weeks.   3. HL - request pcp labs.  - continue current statin   4. Carotid bruits - order carotid US  5. Preoperative evaluation - patient is being considered for hip replacement. From cardiac standpoint recommend proceeding with planned surgery, continue cardiac meds in perioperative period. Ok to hold ASA if needed.   Arnoldo Lenis, M.D.

## 2016-03-30 ENCOUNTER — Telehealth: Payer: Self-pay

## 2016-03-30 NOTE — Telephone Encounter (Signed)
-----   Message from Arnoldo Lenis, MD sent at 03/29/2016  9:23 AM EDT ----- Can we forward my last clinic not to her orthopedic Dr Sherral Hammers MD

## 2016-03-30 NOTE — Telephone Encounter (Signed)
Faxed last office notes to office.

## 2016-04-01 ENCOUNTER — Ambulatory Visit (HOSPITAL_COMMUNITY)
Admission: RE | Admit: 2016-04-01 | Discharge: 2016-04-01 | Disposition: A | Discharge status: Home / Self Care | Payer: BLUE CROSS/BLUE SHIELD | Source: Ambulatory Visit | Attending: Cardiology | Admitting: Cardiology

## 2016-04-01 DIAGNOSIS — I6523 Occlusion and stenosis of bilateral carotid arteries: Secondary | ICD-10-CM | POA: Insufficient documentation

## 2016-04-01 DIAGNOSIS — R0989 Other specified symptoms and signs involving the circulatory and respiratory systems: Secondary | ICD-10-CM | POA: Insufficient documentation

## 2016-04-03 ENCOUNTER — Telehealth: Payer: Self-pay | Admitting: Cardiology

## 2016-04-03 ENCOUNTER — Telehealth: Payer: Self-pay

## 2016-04-03 NOTE — Telephone Encounter (Signed)
Pt lvm returning Lisa's call

## 2016-04-03 NOTE — Telephone Encounter (Signed)
Pt was not at home, so I left message with her daughter to have her call the office for test results.

## 2016-04-06 NOTE — Telephone Encounter (Signed)
Called pt. No answer, left lvm to return call.

## 2016-04-06 NOTE — Progress Notes (Signed)
Quick Note:  Pt made aware. Copy to PCP. ______

## 2016-04-14 ENCOUNTER — Ambulatory Visit: Payer: Self-pay | Admitting: Orthopedic Surgery

## 2016-04-21 ENCOUNTER — Ambulatory Visit: Payer: Self-pay | Admitting: Orthopedic Surgery

## 2016-04-21 NOTE — H&P (Signed)
TOTAL HIP ADMISSION H&P  Patient is admitted for left total hip arthroplasty.  Subjective:  Chief Complaint: left hip pain  HPI: Karina Green, 60 y.o. female, has a history of pain and functional disability in the left hip(s) due to arthritis and patient has failed non-surgical conservative treatments for greater than 12 weeks to include NSAID's and/or analgesics, flexibility and strengthening excercises, use of assistive devices, weight reduction as appropriate and activity modification.  Onset of symptoms was gradual starting 5 years ago with gradually worsening course since that time.The patient noted no past surgery on the left hip(s).  Patient currently rates pain in the left hip at 10 out of 10 with activity. Patient has night pain, worsening of pain with activity and weight bearing, pain that interfers with activities of daily living, pain with passive range of motion and crepitus. Patient has evidence of subchondral cysts, subchondral sclerosis, periarticular osteophytes, joint subluxation and joint space narrowing by imaging studies. This condition presents safety issues increasing the risk of falls.There is no current active infection.  Patient Active Problem List   Diagnosis Date Noted  . Chronic diastolic heart failure ([place]) 06/20/2015  . Aortic valve stenosis, mild 06/20/2015  . CKD (chronic kidney disease), stage III 06/20/2015  . Hypokalemia 06/20/2015  . Sepsis ([place]) 06/20/2015  . Acute respiratory failure with hypoxia ([place]) 06/20/2015  . CAP (community acquired pneumonia) 06/17/2015  . CAD (coronary artery disease), native coronary artery-70% LM stenosis 10/26/2014  . DM2 (diabetes mellitus, type 2) ([place]) 10/26/2014  . Unstable angina ([place]) 10/25/2014  . Essential hypertension 10/24/2014  . Hypercholesteremia 10/24/2014   Past Medical History  Diagnosis Date  . Hypertension   . Back pain   . Arthritis   . Hyperlipidemia   . CAD (coronary artery disease), native  coronary artery-70% LM stenosis 10/25/2014  . DM type 2 (diabetes mellitus, type 2) ([place]) 10/25/14  . Gout     Past Surgical History  Procedure Laterality Date  . Tonsillectomy    . Bladder tack    . Total hip arthroplasty    . Hernia repair    . Cholecystectomy    . Abdominal hysterectomy    . Appendectomy    . Cardiac catheterization  10/25/14    70% LM stensis, normal EF  . Coronary artery bypass graft N/A 10/29/2014    Procedure: CORONARY ARTERY BYPASS GRAFTING (CABG) TIMES TWO USING LEFT INTERNAL MAMMARY ARTERY AND RIGHT LEG GREATER SAPHENOUS VEIN HARVESTED ENDOSCOPICALLY.;  Surgeon: Ivin Poot, MD;  Location: [place];  Service: Open Heart Surgery;  Laterality: N/A;  . Intraoperative transesophageal echocardiogram N/A 10/29/2014    Procedure: INTRAOPERATIVE TRANSESOPHAGEAL ECHOCARDIOGRAM;  Surgeon: Ivin Poot, MD;  Location: [place];  Service: Open Heart Surgery;  Laterality: N/A;  . Left heart catheterization with coronary angiogram N/A 10/26/2014    Procedure: LEFT HEART CATHETERIZATION WITH CORONARY ANGIOGRAM;  Surgeon: Troy Sine, MD;  Location: [facility] CATH LAB;  Service: Cardiovascular;  Laterality: N/A;     (Not in a hospital admission) No Known Allergies  Social History  Substance Use Topics  . Smoking status: Never Smoker   . Smokeless tobacco: Never Used  . Alcohol Use: No    Family History  Problem Relation Age of Onset  . Hypertension Mother   . Hypertension Father   . Coronary artery disease Mother     CABG  . CAD Father     CABG  . Coronary artery disease Brother     Stent  Review of Systems  Constitutional: Negative.   Eyes: Negative.   Respiratory: Positive for cough.   Gastrointestinal: Negative.   Genitourinary: Positive for urgency.  Musculoskeletal: Positive for joint pain.  Skin: Negative.   Neurological: Negative.   Endo/Heme/Allergies: Negative.   Psychiatric/Behavioral: Negative.     Objective:  Physical Exam  Vitals  reviewed. Constitutional: She is oriented to person, place, and time. She appears well-developed and well-nourished.  HENT:  Head: Normocephalic and atraumatic.  Eyes: Conjunctivae and EOM are normal. Pupils are equal, round, and reactive to light.  Neck: Normal range of motion. Neck supple.  Cardiovascular: Normal rate and regular rhythm.   Respiratory: Effort normal and breath sounds normal.  GI: Soft. Bowel sounds are normal. She exhibits no distension.  Genitourinary:  deferred  Musculoskeletal:       Left hip: She exhibits decreased range of motion and crepitus.  Skin clear  Neurological: She is alert and oriented to person, place, and time. She has normal reflexes.  Skin: Skin is warm and dry.  Psychiatric: She has a normal mood and affect. Her behavior is normal. Judgment and thought content normal.    Vital signs in last 24 hours: @VSRANGES @  Labs:   Estimated body mass index is 40.40 kg/(m^2) as calculated from the following:   Height as of 03/27/16: 5\' 3"  (1.6 m).   Weight as of 03/27/16: 103.42 kg (228 lb).   Imaging Review Plain radiographs demonstrate severe degenerative joint disease of the left hip(s). The bone quality appears to be adequate for age and reported activity level.  Assessment/Plan:  End stage arthritis, left hip(s)  The patient history, physical examination, clinical judgement of the provider and imaging studies are consistent with end stage degenerative joint disease of the left hip(s) and total hip arthroplasty is deemed medically necessary. The treatment options including medical management, injection therapy, arthroscopy and arthroplasty were discussed at length. The risks and benefits of total hip arthroplasty were presented and reviewed. The risks due to aseptic loosening, infection, stiffness, dislocation/subluxation,  thromboembolic complications and other imponderables were discussed.  The patient acknowledged the explanation, agreed to proceed  with the plan and consent was signed. Patient is being admitted for inpatient treatment for surgery, pain control, PT, OT, prophylactic antibiotics, VTE prophylaxis, progressive ambulation and ADL's and discharge planning.The patient is planning to be discharged home with home health services

## 2016-04-30 ENCOUNTER — Other Ambulatory Visit (HOSPITAL_COMMUNITY): Payer: Self-pay | Admitting: *Deleted

## 2016-04-30 NOTE — Patient Instructions (Addendum)
Karina Green  04/30/2016   Your procedure is scheduled on: 05-07-16  Report to [facility]  Entrance take [facility]  elevators to 3rd floor to  [place] at 1045 AM.  Call this number if you have problems the morning of surgery [phone]   Remember: ONLY 1 PERSON MAY GO WITH YOU TO SHORT STAY TO GET  READY MORNING OF [place].  Do not eat food or drink liquids :After Midnight.     Take these medicines the morning of surgery with A SIP OF WATER: ALPRAZOLAM (XANAX) if neede, METOPROLOL TARTRATE , es tylenol IF NEEDED                               You may not have any metal on your body including hair pins and              piercings  Do not wear jewelry, make-up, lotions, powders or perfumes, deodorant             Do not wear nail polish.  Do not shave  48 hours prior to surgery.              Men may shave face and neck.   Do not bring valuables to the hospital. [place].  Contacts, dentures or bridgework may not be worn into surgery.  Leave suitcase in the car. After surgery it may be brought to your room.                  Please read over the following fact sheets you were given: _____________________________________________________________________             [facility] - Preparing for Surgery Before surgery, you can play an important role.  Because skin is not sterile, your skin needs to be as free of germs as possible.  You can reduce the number of germs on your skin by washing with CHG (chlorahexidine gluconate) soap before surgery.  CHG is an antiseptic cleaner which kills germs and bonds with the skin to continue killing germs even after washing. Please DO NOT use if you have an allergy to CHG or antibacterial soaps.  If your skin becomes reddened/irritated stop using the CHG and inform your nurse when you arrive at Short Stay. Do not shave (including legs and underarms) for at least 48  hours prior to the first CHG shower.  You may shave your face/neck. Please follow these instructions carefully:  1.  Shower with CHG Soap the night before surgery and the  morning of Surgery.  2.  If you choose to wash your hair, wash your hair first as usual with your  normal  shampoo.  3.  After you shampoo, rinse your hair and body thoroughly to remove the  shampoo.                           4.  Use CHG as you would any other liquid soap.  You can apply chg directly  to the skin and wash                       Gently with a scrungie or  clean washcloth.  5.  Apply the CHG Soap to your body ONLY FROM THE NECK DOWN.   Do not use on face/ open                           Wound or open sores. Avoid contact with eyes, ears mouth and genitals (private parts).                       Wash face,  Genitals (private parts) with your normal soap.             6.  Wash thoroughly, paying special attention to the area where your surgery  will be performed.  7.  Thoroughly rinse your body with warm water from the neck down.  8.  DO NOT shower/wash with your normal soap after using and rinsing off  the CHG Soap.                9.  Pat yourself dry with a clean towel.            10.  Wear clean pajamas.            11.  Place clean sheets on your bed the night of your first shower and do not  sleep with pets. Day of Surgery : Do not apply any lotions/deodorants the morning of surgery.  Please wear clean clothes to the hospital/surgery center.  FAILURE TO FOLLOW THESE INSTRUCTIONS MAY RESULT IN THE CANCELLATION OF YOUR SURGERY PATIENT SIGNATURE_________________________________  NURSE SIGNATURE__________________________________  ________________________________________________________________________   Adam Phenix  An incentive spirometer is a tool that can help keep your lungs clear and active. This tool measures how well you are filling your lungs with each breath. Taking long deep breaths may help  reverse or decrease the chance of developing breathing (pulmonary) problems (especially infection) following:  A long period of time when you are unable to move or be active. BEFORE THE PROCEDURE   If the spirometer includes an indicator to show your best effort, your nurse or respiratory therapist will set it to a desired goal.  If possible, sit up straight or lean slightly forward. Try not to slouch.  Hold the incentive spirometer in an upright position. INSTRUCTIONS FOR USE   Sit on the edge of your bed if possible, or sit up as far as you can in bed or on a chair.  Hold the incentive spirometer in an upright position.  Breathe out normally.  Place the mouthpiece in your mouth and seal your lips tightly around it.  Breathe in slowly and as deeply as possible, raising the piston or the ball toward the top of the column.  Hold your breath for 3-5 seconds or for as long as possible. Allow the piston or ball to fall to the bottom of the column.  Remove the mouthpiece from your mouth and breathe out normally.  Rest for a few seconds and repeat Steps 1 through 7 at least 10 times every 1-2 hours when you are awake. Take your time and take a few normal breaths between deep breaths.  The spirometer may include an indicator to show your best effort. Use the indicator as a goal to work toward during each repetition.  After each set of 10 deep breaths, practice coughing to be sure your lungs are clear. If you have an incision (the cut made at the time of surgery), support your incision when coughing  by placing a pillow or rolled up towels firmly against it. Once you are able to get out of bed, walk around indoors and cough well. You may stop using the incentive spirometer when instructed by your caregiver.  RISKS AND COMPLICATIONS  Take your time so you do not get dizzy or light-headed.  If you are in pain, you may need to take or ask for pain medication before doing incentive spirometry.  It is harder to take a deep breath if you are having pain. AFTER USE  Rest and breathe slowly and easily.  It can be helpful to keep track of a log of your progress. Your caregiver can provide you with a simple table to help with this. If you are using the spirometer at home, follow these instructions: [place] IF:   You are having difficultly using the spirometer.  You have trouble using the spirometer as often as instructed.  Your pain medication is not giving enough relief while using the spirometer.  You develop fever of 100.5 F (38.1 C) or higher. SEEK IMMEDIATE MEDICAL CARE IF:   You cough up bloody sputum that had not been present before.  You develop fever of 102 F (38.9 C) or greater.  You develop worsening pain at or near the incision site. MAKE SURE YOU:   Understand these instructions.  Will watch your condition.  Will get help right away if you are not doing well or get worse. Document Released: 04/19/2007 Document Revised: 02/29/2012 Document Reviewed: 06/20/2007 ExitCare Patient Information 2014 ExitCare, [place].   ________________________________________________________________________  WHAT IS A BLOOD TRANSFUSION? Blood Transfusion Information  A transfusion is the replacement of blood or some of its parts. Blood is made up of multiple cells which provide different functions.  Red blood cells carry oxygen and are used for blood loss replacement.  White blood cells fight against infection.  Platelets control bleeding.  Plasma helps clot blood.  Other blood products are available for specialized needs, such as hemophilia or other clotting disorders. BEFORE THE TRANSFUSION  Who gives blood for transfusions?   Healthy volunteers who are fully evaluated to make sure their blood is safe. This is blood bank blood. Transfusion therapy is the safest it has ever been in the practice of medicine. Before blood is taken from a donor, a complete  history is taken to make sure that person has no history of diseases nor engages in risky social behavior (examples are intravenous drug use or sexual activity with multiple partners). The donor's travel history is screened to minimize risk of transmitting infections, such as malaria. The donated blood is tested for signs of infectious diseases, such as HIV and hepatitis. The blood is then tested to be sure it is compatible with you in order to minimize the chance of a transfusion reaction. If you or a relative donates blood, this is often done in anticipation of surgery and is not appropriate for emergency situations. It takes many days to process the donated blood. RISKS AND COMPLICATIONS Although transfusion therapy is very safe and saves many lives, the main dangers of transfusion include:   Getting an infectious disease.  Developing a transfusion reaction. This is an allergic reaction to something in the blood you were given. Every precaution is taken to prevent this. The decision to have a blood transfusion has been considered carefully by your caregiver before blood is given. Blood is not given unless the benefits outweigh the risks. AFTER THE TRANSFUSION  Right after receiving a  blood transfusion, you will usually feel much better and more energetic. This is especially true if your red blood cells have gotten low (anemic). The transfusion raises the level of the red blood cells which carry oxygen, and this usually causes an energy increase.  The nurse administering the transfusion will monitor you carefully for complications. HOME CARE INSTRUCTIONS  No special instructions are needed after a transfusion. You may find your energy is better. Speak with your caregiver about any limitations on activity for underlying diseases you may have. SEEK MEDICAL CARE IF:   Your condition is not improving after your transfusion.  You develop redness or irritation at the intravenous (IV) site. SEEK  IMMEDIATE MEDICAL CARE IF:  Any of the following symptoms occur over the next 12 hours:  Shaking chills.  You have a temperature by mouth above 102 F (38.9 C), not controlled by medicine.  Chest, back, or muscle pain.  People around you feel you are not acting correctly or are confused.  Shortness of breath or difficulty breathing.  Dizziness and fainting.  You get a rash or develop hives.  You have a decrease in urine output.  Your urine turns a dark color or changes to pink, red, or brown. Any of the following symptoms occur over the next 10 days:  You have a temperature by mouth above 102 F (38.9 C), not controlled by medicine.  Shortness of breath.  Weakness after normal activity.  The white part of the eye turns yellow (jaundice).  You have a decrease in the amount of urine or are urinating less often.  Your urine turns a dark color or changes to pink, red, or brown. Document Released: 12/04/2000 Document Revised: 02/29/2012 Document Reviewed: 07/23/2008 [facility] Patient Information 2014 [place], [place].  _______________________________________________________________________

## 2016-04-30 NOTE — Progress Notes (Addendum)
CARDIAC CLEARCNE NOTE DR [facility] 03-27-16 EPIC  CAROTID DUPLEX 04-01-16 EPIC ECHO 06-18-15 EPIC PF TEST 10-26-14 EPIC EKG 06-18-15 EPIC Dental clearance dr Abel Presto on chart Medical clearance dr Percell Belt 01-21-16 on chart

## 2016-05-01 ENCOUNTER — Encounter (HOSPITAL_COMMUNITY): Payer: Self-pay

## 2016-05-01 ENCOUNTER — Encounter (HOSPITAL_COMMUNITY)
Admission: RE | Admit: 2016-05-01 | Discharge: 2016-05-01 | Disposition: A | Discharge status: Home / Self Care | Payer: BLUE CROSS/BLUE SHIELD | Source: Ambulatory Visit | Attending: Orthopedic Surgery | Admitting: Orthopedic Surgery

## 2016-05-01 DIAGNOSIS — Z01812 Encounter for preprocedural laboratory examination: Secondary | ICD-10-CM | POA: Diagnosis present

## 2016-05-01 DIAGNOSIS — Z01818 Encounter for other preprocedural examination: Secondary | ICD-10-CM | POA: Diagnosis present

## 2016-05-01 HISTORY — DX: Cough, unspecified: R05.9

## 2016-05-01 HISTORY — DX: Cough: R05

## 2016-05-01 LAB — CBC
HEMATOCRIT: 41.3 % (ref 36.0–46.0)
HEMOGLOBIN: 12.8 g/dL (ref 12.0–15.0)
MCH: 25.7 pg — ABNORMAL LOW (ref 26.0–34.0)
MCHC: 31 g/dL (ref 30.0–36.0)
MCV: 82.9 fL (ref 78.0–100.0)
Platelets: 264 10*3/uL (ref 150–400)
RBC: 4.98 MIL/uL (ref 3.87–5.11)
RDW: 16.7 % — ABNORMAL HIGH (ref 11.5–15.5)
WBC: 11.1 10*3/uL — AB (ref 4.0–10.5)

## 2016-05-01 LAB — BASIC METABOLIC PANEL
ANION GAP: 10 (ref 5–15)
BUN: 24 mg/dL — ABNORMAL HIGH (ref 6–20)
CHLORIDE: 108 mmol/L (ref 101–111)
CO2: 24 mmol/L (ref 22–32)
CREATININE: 1.09 mg/dL — AB (ref 0.44–1.00)
Calcium: 9.3 mg/dL (ref 8.9–10.3)
GFR calc non Af Amer: 54 mL/min — ABNORMAL LOW (ref >60)
Glucose, Bld: 109 mg/dL — ABNORMAL HIGH (ref 65–99)
Potassium: 4.5 mmol/L (ref 3.5–5.1)
SODIUM: 142 mmol/L (ref 135–145)

## 2016-05-01 LAB — SURGICAL PCR SCREEN
MRSA, PCR: NEGATIVE
STAPHYLOCOCCUS AUREUS: POSITIVE — AB

## 2016-05-01 LAB — TYPE AND SCREEN
ABO/RH(D): A POS
ANTIBODY SCREEN: NEGATIVE

## 2016-05-01 LAB — ABO/RH: ABO/RH(D): A POS

## 2016-05-01 NOTE — Progress Notes (Signed)
Spoke with dr Smith Robert and made aware of 06-20-15 chest xray results and pt medicak history, no chest xray needed with pre op today.

## 2016-05-02 LAB — HEMOGLOBIN A1C
Hgb A1c MFr Bld: 6.7 % — ABNORMAL HIGH (ref 4.8–5.6)
Mean Plasma Glucose: 146 mg/dL

## 2016-05-07 ENCOUNTER — Inpatient Hospital Stay (HOSPITAL_COMMUNITY): Payer: BLUE CROSS/BLUE SHIELD | Admitting: Certified Registered"

## 2016-05-07 ENCOUNTER — Inpatient Hospital Stay (HOSPITAL_COMMUNITY): Payer: BLUE CROSS/BLUE SHIELD

## 2016-05-07 ENCOUNTER — Encounter (HOSPITAL_COMMUNITY): Payer: Self-pay | Admitting: *Deleted

## 2016-05-07 ENCOUNTER — Inpatient Hospital Stay (HOSPITAL_COMMUNITY)
Admission: AD | Admit: 2016-05-07 | Discharge: 2016-05-08 | DRG: 470 | Disposition: A | Discharge status: Home Health | Payer: BLUE CROSS/BLUE SHIELD | Source: Ambulatory Visit | Attending: Orthopedic Surgery | Admitting: Orthopedic Surgery

## 2016-05-07 ENCOUNTER — Encounter (HOSPITAL_COMMUNITY)
Admission: AD | Disposition: A | Discharge status: Home Health | Payer: Self-pay | Source: Ambulatory Visit | Attending: Orthopedic Surgery

## 2016-05-07 DIAGNOSIS — Z951 Presence of aortocoronary bypass graft: Secondary | ICD-10-CM | POA: Diagnosis not present

## 2016-05-07 DIAGNOSIS — E1122 Type 2 diabetes mellitus with diabetic chronic kidney disease: Secondary | ICD-10-CM | POA: Diagnosis present

## 2016-05-07 DIAGNOSIS — N183 Chronic kidney disease, stage 3 (moderate): Secondary | ICD-10-CM | POA: Diagnosis present

## 2016-05-07 DIAGNOSIS — Z79899 Other long term (current) drug therapy: Secondary | ICD-10-CM

## 2016-05-07 DIAGNOSIS — Z8249 Family history of ischemic heart disease and other diseases of the circulatory system: Secondary | ICD-10-CM

## 2016-05-07 DIAGNOSIS — I130 Hypertensive heart and chronic kidney disease with heart failure and stage 1 through stage 4 chronic kidney disease, or unspecified chronic kidney disease: Secondary | ICD-10-CM | POA: Diagnosis present

## 2016-05-07 DIAGNOSIS — I5032 Chronic diastolic (congestive) heart failure: Secondary | ICD-10-CM | POA: Diagnosis present

## 2016-05-07 DIAGNOSIS — M25552 Pain in left hip: Secondary | ICD-10-CM

## 2016-05-07 DIAGNOSIS — I2510 Atherosclerotic heart disease of native coronary artery without angina pectoris: Secondary | ICD-10-CM | POA: Diagnosis present

## 2016-05-07 DIAGNOSIS — M1612 Unilateral primary osteoarthritis, left hip: Secondary | ICD-10-CM | POA: Diagnosis present

## 2016-05-07 DIAGNOSIS — Z09 Encounter for follow-up examination after completed treatment for conditions other than malignant neoplasm: Secondary | ICD-10-CM

## 2016-05-07 DIAGNOSIS — E785 Hyperlipidemia, unspecified: Secondary | ICD-10-CM | POA: Diagnosis present

## 2016-05-07 HISTORY — PX: TOTAL HIP ARTHROPLASTY: SHX124

## 2016-05-07 LAB — GLUCOSE, CAPILLARY
GLUCOSE-CAPILLARY: 128 mg/dL — AB (ref 65–99)
Glucose-Capillary: 110 mg/dL — ABNORMAL HIGH (ref 65–99)

## 2016-05-07 SURGERY — ARTHROPLASTY, HIP, TOTAL, ANTERIOR APPROACH
Anesthesia: Monitor Anesthesia Care | Site: Hip | Laterality: Left

## 2016-05-07 MED ORDER — ASPIRIN EC 81 MG PO TBEC
81.0000 mg | DELAYED_RELEASE_TABLET | Freq: Two times a day (BID) | ORAL | Status: DC
  Administered 2016-05-08: 81 mg via ORAL
  Filled 2016-05-07: qty 1

## 2016-05-07 MED ORDER — KETOROLAC TROMETHAMINE 30 MG/ML IJ SOLN
INTRAMUSCULAR | Status: DC | PRN
  Administered 2016-05-07: 30 mg

## 2016-05-07 MED ORDER — ISOPROPYL ALCOHOL 70 % SOLN
Status: AC
  Filled 2016-05-07: qty 480

## 2016-05-07 MED ORDER — PHENYLEPHRINE 40 MCG/ML (10ML) SYRINGE FOR IV PUSH (FOR BLOOD PRESSURE SUPPORT)
PREFILLED_SYRINGE | INTRAVENOUS | Status: AC
Start: 2016-05-07 — End: 2016-05-07
  Filled 2016-05-07: qty 20

## 2016-05-07 MED ORDER — ACETAMINOPHEN 650 MG RE SUPP: 650.0000 mg | Freq: Four times a day (QID) | RECTAL | Status: DC | PRN

## 2016-05-07 MED ORDER — PROPOFOL 10 MG/ML IV BOLUS
INTRAVENOUS | Status: AC
  Filled 2016-05-07: qty 20

## 2016-05-07 MED ORDER — METHOCARBAMOL 500 MG PO TABS: 500.0000 mg | ORAL_TABLET | Freq: Four times a day (QID) | ORAL | Status: DC | PRN

## 2016-05-07 MED ORDER — SODIUM CHLORIDE 0.9 % IR SOLN
Status: DC | PRN
  Administered 2016-05-07: 1000 mL

## 2016-05-07 MED ORDER — PHENYLEPHRINE HCL 10 MG/ML IJ SOLN
INTRAMUSCULAR | Status: AC
  Filled 2016-05-07: qty 1

## 2016-05-07 MED ORDER — LIDOCAINE HCL (CARDIAC) 20 MG/ML IV SOLN
INTRAVENOUS | Status: AC
  Filled 2016-05-07: qty 5

## 2016-05-07 MED ORDER — FUROSEMIDE 20 MG PO TABS
20.0000 mg | ORAL_TABLET | Freq: Every day | ORAL | Status: DC
  Administered 2016-05-08: 20 mg via ORAL
  Filled 2016-05-07 (×2): qty 1

## 2016-05-07 MED ORDER — ACETAMINOPHEN 10 MG/ML IV SOLN
INTRAVENOUS | Status: AC
  Filled 2016-05-07: qty 100

## 2016-05-07 MED ORDER — METHOCARBAMOL 1000 MG/10ML IJ SOLN
500.0000 mg | Freq: Four times a day (QID) | INTRAVENOUS | Status: DC | PRN
  Filled 2016-05-07: qty 5

## 2016-05-07 MED ORDER — SODIUM CHLORIDE 0.9 % IV SOLN
INTRAVENOUS | Status: DC
  Administered 2016-05-07: 17:00:00 via INTRAVENOUS

## 2016-05-07 MED ORDER — ISOPROPYL ALCOHOL 70 % SOLN
Status: DC | PRN
  Administered 2016-05-07: 1 via TOPICAL

## 2016-05-07 MED ORDER — BUPIVACAINE IN DEXTROSE 0.75-8.25 % IT SOLN
INTRATHECAL | Status: DC | PRN
  Administered 2016-05-07: 2 mL via INTRATHECAL

## 2016-05-07 MED ORDER — LACTATED RINGERS IV SOLN
INTRAVENOUS | Status: DC
  Administered 2016-05-07 (×2): via INTRAVENOUS

## 2016-05-07 MED ORDER — ONDANSETRON HCL 4 MG/2ML IJ SOLN
INTRAMUSCULAR | Status: DC | PRN
  Administered 2016-05-07 (×2): 2 mg via INTRAVENOUS

## 2016-05-07 MED ORDER — KETOROLAC TROMETHAMINE 15 MG/ML IJ SOLN
7.5000 mg | Freq: Four times a day (QID) | INTRAMUSCULAR | Status: DC
  Administered 2016-05-07 – 2016-05-08 (×2): 7.5 mg via INTRAVENOUS
  Filled 2016-05-07 (×2): qty 1

## 2016-05-07 MED ORDER — WATER FOR IRRIGATION, STERILE IR SOLN
Status: DC | PRN
  Administered 2016-05-07: 2000 mL

## 2016-05-07 MED ORDER — SODIUM CHLORIDE 0.9 % IJ SOLN
INTRAMUSCULAR | Status: AC
  Filled 2016-05-07: qty 50

## 2016-05-07 MED ORDER — PROPOFOL 500 MG/50ML IV EMUL
INTRAVENOUS | Status: DC | PRN
  Administered 2016-05-07: 100 ug/kg/min via INTRAVENOUS

## 2016-05-07 MED ORDER — MENTHOL 3 MG MT LOZG: 1.0000 | LOZENGE | OROMUCOSAL | Status: DC | PRN

## 2016-05-07 MED ORDER — HYDROGEN PEROXIDE 3 % EX SOLN
CUTANEOUS | Status: DC | PRN
  Administered 2016-05-07: 1

## 2016-05-07 MED ORDER — PHENYLEPHRINE HCL 10 MG/ML IJ SOLN
INTRAMUSCULAR | Status: DC | PRN
  Administered 2016-05-07: 40 ug via INTRAVENOUS
  Administered 2016-05-07: 80 ug via INTRAVENOUS

## 2016-05-07 MED ORDER — ONDANSETRON HCL 4 MG PO TABS: 4.0000 mg | ORAL_TABLET | Freq: Four times a day (QID) | ORAL | Status: DC | PRN

## 2016-05-07 MED ORDER — LIDOCAINE HCL (CARDIAC) 20 MG/ML IV SOLN
INTRAVENOUS | Status: DC | PRN
  Administered 2016-05-07: 60 mg via INTRAVENOUS

## 2016-05-07 MED ORDER — BUPIVACAINE-EPINEPHRINE (PF) 0.25% -1:200000 IJ SOLN
INTRAMUSCULAR | Status: AC
  Filled 2016-05-07: qty 30

## 2016-05-07 MED ORDER — PROPOFOL 10 MG/ML IV BOLUS
INTRAVENOUS | Status: DC | PRN
  Administered 2016-05-07: 20 mg via INTRAVENOUS
  Administered 2016-05-07 (×2): 30 mg via INTRAVENOUS

## 2016-05-07 MED ORDER — PHENOL 1.4 % MT LIQD: 1.0000 | OROMUCOSAL | Status: DC | PRN

## 2016-05-07 MED ORDER — DOCUSATE SODIUM 100 MG PO CAPS
100.0000 mg | ORAL_CAPSULE | Freq: Two times a day (BID) | ORAL | Status: DC
  Administered 2016-05-08: 100 mg via ORAL
  Filled 2016-05-07 (×2): qty 1

## 2016-05-07 MED ORDER — EPHEDRINE SULFATE 50 MG/ML IJ SOLN: INTRAMUSCULAR | Status: DC | PRN

## 2016-05-07 MED ORDER — SODIUM CHLORIDE 0.9 % IV SOLN: INTRAVENOUS | Status: DC

## 2016-05-07 MED ORDER — METOCLOPRAMIDE HCL 5 MG PO TABS: 5.0000 mg | ORAL_TABLET | Freq: Three times a day (TID) | ORAL | Status: DC | PRN

## 2016-05-07 MED ORDER — HYDROGEN PEROXIDE 3 % EX SOLN
CUTANEOUS | Status: AC
  Filled 2016-05-07: qty 473

## 2016-05-07 MED ORDER — POVIDONE-IODINE 10 % EX SWAB: 2.0000 "application " | Freq: Once | CUTANEOUS | Status: DC

## 2016-05-07 MED ORDER — BUPIVACAINE-EPINEPHRINE 0.25% -1:200000 IJ SOLN
INTRAMUSCULAR | Status: DC | PRN
  Administered 2016-05-07: 30 mL

## 2016-05-07 MED ORDER — ACETAMINOPHEN 325 MG PO TABS: 650.0000 mg | ORAL_TABLET | Freq: Four times a day (QID) | ORAL | Status: DC | PRN

## 2016-05-07 MED ORDER — HYDROMORPHONE HCL 1 MG/ML IJ SOLN: 0.5000 mg | INTRAMUSCULAR | Status: DC | PRN

## 2016-05-07 MED ORDER — KETOROLAC TROMETHAMINE 30 MG/ML IJ SOLN
INTRAMUSCULAR | Status: AC
  Filled 2016-05-07: qty 1

## 2016-05-07 MED ORDER — HYDROMORPHONE HCL 1 MG/ML IJ SOLN: 0.2500 mg | INTRAMUSCULAR | Status: DC | PRN

## 2016-05-07 MED ORDER — HYDROCODONE-ACETAMINOPHEN 5-325 MG PO TABS
1.0000 | ORAL_TABLET | ORAL | Status: DC | PRN
  Administered 2016-05-08 (×3): 1 via ORAL
  Filled 2016-05-07 (×3): qty 1

## 2016-05-07 MED ORDER — CEFAZOLIN SODIUM-DEXTROSE 2-4 GM/100ML-% IV SOLN
2.0000 g | INTRAVENOUS | Status: AC
  Administered 2016-05-07: 2 g via INTRAVENOUS
  Filled 2016-05-07: qty 100

## 2016-05-07 MED ORDER — MORPHINE SULFATE (PF) 2 MG/ML IV SOLN
INTRAVENOUS | Status: AC
  Administered 2016-05-07: 1 mg via INTRAVENOUS
  Filled 2016-05-07: qty 1

## 2016-05-07 MED ORDER — DEXAMETHASONE SODIUM PHOSPHATE 10 MG/ML IJ SOLN
10.0000 mg | Freq: Once | INTRAMUSCULAR | Status: AC
  Administered 2016-05-08: 10 mg via INTRAVENOUS
  Filled 2016-05-07: qty 1

## 2016-05-07 MED ORDER — ONDANSETRON HCL 4 MG/2ML IJ SOLN
INTRAMUSCULAR | Status: AC
  Filled 2016-05-07: qty 2

## 2016-05-07 MED ORDER — METOPROLOL TARTRATE 12.5 MG HALF TABLET
12.5000 mg | ORAL_TABLET | Freq: Two times a day (BID) | ORAL | Status: DC
  Administered 2016-05-08: 12.5 mg via ORAL
  Filled 2016-05-07: qty 1

## 2016-05-07 MED ORDER — METOCLOPRAMIDE HCL 5 MG/ML IJ SOLN: 5.0000 mg | Freq: Three times a day (TID) | INTRAMUSCULAR | Status: DC | PRN

## 2016-05-07 MED ORDER — ATORVASTATIN CALCIUM 20 MG PO TABS
80.0000 mg | ORAL_TABLET | Freq: Every day | ORAL | Status: DC
  Filled 2016-05-07 (×2): qty 4

## 2016-05-07 MED ORDER — PHENYLEPHRINE HCL 10 MG/ML IJ SOLN
10.0000 mg | INTRAVENOUS | Status: DC | PRN
  Administered 2016-05-07: 25 ug/min via INTRAVENOUS

## 2016-05-07 MED ORDER — ONDANSETRON HCL 4 MG/2ML IJ SOLN: 4.0000 mg | Freq: Four times a day (QID) | INTRAMUSCULAR | Status: DC | PRN

## 2016-05-07 MED ORDER — SODIUM CHLORIDE 0.9 % IJ SOLN
INTRAMUSCULAR | Status: DC | PRN
  Administered 2016-05-07: 30 mL

## 2016-05-07 MED ORDER — FENTANYL CITRATE (PF) 100 MCG/2ML IJ SOLN
INTRAMUSCULAR | Status: DC | PRN
  Administered 2016-05-07 (×2): 50 ug via INTRAVENOUS

## 2016-05-07 MED ORDER — CEFAZOLIN SODIUM-DEXTROSE 2-4 GM/100ML-% IV SOLN
2.0000 g | Freq: Four times a day (QID) | INTRAVENOUS | Status: AC
  Administered 2016-05-07 – 2016-05-08 (×2): 2 g via INTRAVENOUS
  Filled 2016-05-07 (×2): qty 100

## 2016-05-07 MED ORDER — TRANEXAMIC ACID 1000 MG/10ML IV SOLN
1000.0000 mg | Freq: Once | INTRAVENOUS | Status: AC
  Administered 2016-05-07: 1000 mg via INTRAVENOUS
  Filled 2016-05-07: qty 10

## 2016-05-07 MED ORDER — MIDAZOLAM HCL 5 MG/5ML IJ SOLN
INTRAMUSCULAR | Status: DC | PRN
  Administered 2016-05-07: 2 mg via INTRAVENOUS

## 2016-05-07 MED ORDER — ACETAMINOPHEN 10 MG/ML IV SOLN
1000.0000 mg | INTRAVENOUS | Status: AC
  Administered 2016-05-07: 1000 mg via INTRAVENOUS
  Filled 2016-05-07: qty 100

## 2016-05-07 MED ORDER — CEFAZOLIN SODIUM-DEXTROSE 2-4 GM/100ML-% IV SOLN
INTRAVENOUS | Status: AC
  Filled 2016-05-07: qty 100

## 2016-05-07 MED ORDER — ALPRAZOLAM 0.5 MG PO TABS: 0.5000 mg | ORAL_TABLET | Freq: Every day | ORAL | Status: DC | PRN

## 2016-05-07 MED ORDER — SODIUM CHLORIDE 0.9 % IV SOLN
1000.0000 mg | INTRAVENOUS | Status: AC
  Administered 2016-05-07: 1000 mg via INTRAVENOUS
  Filled 2016-05-07: qty 10

## 2016-05-07 MED ORDER — LOSARTAN POTASSIUM 25 MG PO TABS: 12.5000 mg | ORAL_TABLET | Freq: Every day | ORAL | Status: DC

## 2016-05-07 MED ORDER — SENNA 8.6 MG PO TABS
2.0000 | ORAL_TABLET | Freq: Every day | ORAL | Status: DC
  Filled 2016-05-07: qty 2

## 2016-05-07 MED ORDER — FENTANYL CITRATE (PF) 100 MCG/2ML IJ SOLN
INTRAMUSCULAR | Status: AC
  Filled 2016-05-07: qty 2

## 2016-05-07 MED ORDER — MIDAZOLAM HCL 2 MG/2ML IJ SOLN
INTRAMUSCULAR | Status: AC
  Filled 2016-05-07: qty 2

## 2016-05-07 MED ORDER — ALBUTEROL SULFATE HFA 108 (90 BASE) MCG/ACT IN AERS
INHALATION_SPRAY | RESPIRATORY_TRACT | Status: AC
  Filled 2016-05-07: qty 6.7

## 2016-05-07 SURGICAL SUPPLY — 47 items
BAG DECANTER FOR FLEXI CONT (MISCELLANEOUS) IMPLANT
BAG SPEC THK2 15X12 ZIP CLS (MISCELLANEOUS)
BAG ZIPLOCK 12X15 (MISCELLANEOUS) IMPLANT
CAPT HIP TOTAL 2 ×1 IMPLANT
CHLORAPREP W/TINT 26ML (MISCELLANEOUS) ×2 IMPLANT
CLOTH BEACON ORANGE TIMEOUT ST (SAFETY) ×2 IMPLANT
COVER PERINEAL POST (MISCELLANEOUS) ×2 IMPLANT
DECANTER SPIKE VIAL GLASS SM (MISCELLANEOUS) ×2 IMPLANT
DRAPE LG THREE QUARTER DISP (DRAPES) ×4 IMPLANT
DRAPE STERI IOBAN 125X83 (DRAPES) ×2 IMPLANT
DRAPE U-SHAPE 47X51 STRL (DRAPES) ×4 IMPLANT
DRSG AQUACEL AG ADV 3.5X10 (GAUZE/BANDAGES/DRESSINGS) ×2 IMPLANT
ELECT REM PT RETURN 15FT ADLT (MISCELLANEOUS) ×2 IMPLANT
GAUZE SPONGE 4X4 12PLY STRL (GAUZE/BANDAGES/DRESSINGS) ×2 IMPLANT
GLOVE BIO SURGEON STRL SZ8.5 (GLOVE) ×4 IMPLANT
GLOVE BIOGEL M STRL SZ7.5 (GLOVE) ×1 IMPLANT
GLOVE BIOGEL PI IND STRL 7.5 (GLOVE) IMPLANT
GLOVE BIOGEL PI IND STRL 8.5 (GLOVE) ×1 IMPLANT
GLOVE BIOGEL PI INDICATOR 7.5 (GLOVE) ×1
GLOVE BIOGEL PI INDICATOR 8.5 (GLOVE) ×1
GOWN SPEC L3 XXLG W/TWL (GOWN DISPOSABLE) ×2 IMPLANT
GOWN STRL REUS W/ TWL XL LVL3 (GOWN DISPOSABLE) IMPLANT
GOWN STRL REUS W/TWL XL LVL3 (GOWN DISPOSABLE) ×2
HANDPIECE INTERPULSE COAX TIP (DISPOSABLE) ×2
HOLDER FOLEY CATH W/STRAP (MISCELLANEOUS) ×2 IMPLANT
HOOD PEEL AWAY FLYTE STAYCOOL (MISCELLANEOUS) ×4 IMPLANT
LIQUID BAND (GAUZE/BANDAGES/DRESSINGS) ×4 IMPLANT
MARKER SKIN DUAL TIP RULER LAB (MISCELLANEOUS) ×2 IMPLANT
NDL SPNL 18GX3.5 QUINCKE PK (NEEDLE) ×1 IMPLANT
NEEDLE SPNL 18GX3.5 QUINCKE PK (NEEDLE) ×2 IMPLANT
PACK ANTERIOR HIP CUSTOM (KITS) ×2 IMPLANT
SAW OSC TIP CART 19.5X105X1.3 (SAW) ×2 IMPLANT
SEALER BIPOLAR AQUA 6.0 (INSTRUMENTS) ×2 IMPLANT
SET HNDPC FAN SPRY TIP SCT (DISPOSABLE) ×1 IMPLANT
SOL PREP POV-IOD 4OZ 10% (MISCELLANEOUS) ×2 IMPLANT
SUT ETHIBOND NAB CT1 #1 30IN (SUTURE) ×4 IMPLANT
SUT MNCRL AB 3-0 PS2 18 (SUTURE) ×2 IMPLANT
SUT MON AB 2-0 CT1 36 (SUTURE) ×4 IMPLANT
SUT VIC AB 1 CT1 36 (SUTURE) ×2 IMPLANT
SUT VIC AB 2-0 CT1 27 (SUTURE) ×2
SUT VIC AB 2-0 CT1 TAPERPNT 27 (SUTURE) ×1 IMPLANT
SUT VLOC 180 0 24IN GS25 (SUTURE) ×2 IMPLANT
SYR 50ML LL SCALE MARK (SYRINGE) ×2 IMPLANT
TRAY FOLEY W/METER SILVER 14FR (SET/KITS/TRAYS/PACK) ×1 IMPLANT
TRAY FOLEY W/METER SILVER 16FR (SET/KITS/TRAYS/PACK) IMPLANT
WATER STERILE IRR 1500ML POUR (IV SOLUTION) ×3 IMPLANT
YANKAUER SUCT BULB TIP 10FT TU (MISCELLANEOUS) ×2 IMPLANT

## 2016-05-07 NOTE — Anesthesia Procedure Notes (Signed)
Spinal Patient location during procedure: OR Start time: 05/07/2016 12:47 PM End time: 05/07/2016 12:48 PM Reason for block: at surgeon's request Staffing Resident/CRNA: Freddie Breech Performed by: resident/CRNA  Preanesthetic Checklist Completed: patient identified, site marked, surgical consent, pre-op evaluation, timeout performed, IV checked, risks and benefits discussed, monitors and equipment checked and at surgeon's request Spinal Block Patient position: sitting Prep: ChloraPrep Patient monitoring: heart rate, continuous pulse ox and blood pressure Approach: midline Location: L3-4 Injection technique: single-shot Needle Needle type: Sprotte  Needle gauge: 24 G Needle length: 12.7 cm Assessment Sensory level: T6 Additional Notes Expiration date of kit checked and confirmed. Patient tolerated procedure well, without complications. X 1 attempt with noted clear CSF return. Loss of motor and sensory on exam post injection.

## 2016-05-07 NOTE — H&P (View-Only) (Signed)
TOTAL HIP ADMISSION H&P  Patient is admitted for left total hip arthroplasty.  Subjective:  Chief Complaint: left hip pain  HPI: Karina Green, 60 y.o. female, has a history of pain and functional disability in the left hip(s) due to arthritis and patient has failed non-surgical conservative treatments for greater than 12 weeks to include NSAID's and/or analgesics, flexibility and strengthening excercises, use of assistive devices, weight reduction as appropriate and activity modification.  Onset of symptoms was gradual starting 5 years ago with gradually worsening course since that time.The patient noted no past surgery on the left hip(s).  Patient currently rates pain in the left hip at 10 out of 10 with activity. Patient has night pain, worsening of pain with activity and weight bearing, pain that interfers with activities of daily living, pain with passive range of motion and crepitus. Patient has evidence of subchondral cysts, subchondral sclerosis, periarticular osteophytes, joint subluxation and joint space narrowing by imaging studies. This condition presents safety issues increasing the risk of falls.There is no current active infection.  Patient Active Problem List   Diagnosis Date Noted  . Chronic diastolic heart failure ([place]) 06/20/2015  . Aortic valve stenosis, mild 06/20/2015  . CKD (chronic kidney disease), stage III 06/20/2015  . Hypokalemia 06/20/2015  . Sepsis ([place]) 06/20/2015  . Acute respiratory failure with hypoxia ([place]) 06/20/2015  . CAP (community acquired pneumonia) 06/17/2015  . CAD (coronary artery disease), native coronary artery-70% LM stenosis 10/26/2014  . DM2 (diabetes mellitus, type 2) ([place]) 10/26/2014  . Unstable angina ([place]) 10/25/2014  . Essential hypertension 10/24/2014  . Hypercholesteremia 10/24/2014   Past Medical History  Diagnosis Date  . Hypertension   . Back pain   . Arthritis   . Hyperlipidemia   . CAD (coronary artery disease), native  coronary artery-70% LM stenosis 10/25/2014  . DM type 2 (diabetes mellitus, type 2) ([place]) 10/25/14  . Gout     Past Surgical History  Procedure Laterality Date  . Tonsillectomy    . Bladder tack    . Total hip arthroplasty    . Hernia repair    . Cholecystectomy    . Abdominal hysterectomy    . Appendectomy    . Cardiac catheterization  10/25/14    70% LM stensis, normal EF  . Coronary artery bypass graft N/A 10/29/2014    Procedure: CORONARY ARTERY BYPASS GRAFTING (CABG) TIMES TWO USING LEFT INTERNAL MAMMARY ARTERY AND RIGHT LEG GREATER SAPHENOUS VEIN HARVESTED ENDOSCOPICALLY.;  Surgeon: Ivin Poot, MD;  Location: [place];  Service: Open Heart Surgery;  Laterality: N/A;  . Intraoperative transesophageal echocardiogram N/A 10/29/2014    Procedure: INTRAOPERATIVE TRANSESOPHAGEAL ECHOCARDIOGRAM;  Surgeon: Ivin Poot, MD;  Location: [place];  Service: Open Heart Surgery;  Laterality: N/A;  . Left heart catheterization with coronary angiogram N/A 10/26/2014    Procedure: LEFT HEART CATHETERIZATION WITH CORONARY ANGIOGRAM;  Surgeon: Troy Sine, MD;  Location: [facility];  Service: Cardiovascular;  Laterality: N/A;     (Not in a hospital admission) No Known Allergies  Social History  Substance Use Topics  . Smoking status: Never Smoker   . Smokeless tobacco: Never Used  . Alcohol Use: No    Family History  Problem Relation Age of Onset  . Hypertension Mother   . Hypertension Father   . Coronary artery disease Mother     CABG  . CAD Father     CABG  . Coronary artery disease Brother     Stent  Review of Systems  Constitutional: Negative.   Eyes: Negative.   Respiratory: Positive for cough.   Gastrointestinal: Negative.   Genitourinary: Positive for urgency.  Musculoskeletal: Positive for joint pain.  Skin: Negative.   Neurological: Negative.   Endo/Heme/Allergies: Negative.   Psychiatric/Behavioral: Negative.     Objective:  Physical Exam  Vitals  reviewed. Constitutional: She is oriented to person, place, and time. She appears well-developed and well-nourished.  HENT:  Head: Normocephalic and atraumatic.  Eyes: Conjunctivae and EOM are normal. Pupils are equal, round, and reactive to light.  Neck: Normal range of motion. Neck supple.  Cardiovascular: Normal rate and regular rhythm.   Respiratory: Effort normal and breath sounds normal.  GI: Soft. Bowel sounds are normal. She exhibits no distension.  Genitourinary:  deferred  Musculoskeletal:       Left hip: She exhibits decreased range of motion and crepitus.  Skin clear  Neurological: She is alert and oriented to person, place, and time. She has normal reflexes.  Skin: Skin is warm and dry.  Psychiatric: She has a normal mood and affect. Her behavior is normal. Judgment and thought content normal.    Vital signs in last 24 hours: @VSRANGES @  Labs:   Estimated body mass index is 40.40 kg/(m^2) as calculated from the following:   Height as of 03/27/16: 5\' 3"  (1.6 m).   Weight as of 03/27/16: 103.42 kg (228 lb).   Imaging Review Plain radiographs demonstrate severe degenerative joint disease of the left hip(s). The bone quality appears to be adequate for age and reported activity level.  Assessment/Plan:  End stage arthritis, left hip(s)  The patient history, physical examination, clinical judgement of the provider and imaging studies are consistent with end stage degenerative joint disease of the left hip(s) and total hip arthroplasty is deemed medically necessary. The treatment options including medical management, injection therapy, arthroscopy and arthroplasty were discussed at length. The risks and benefits of total hip arthroplasty were presented and reviewed. The risks due to aseptic loosening, infection, stiffness, dislocation/subluxation,  thromboembolic complications and other imponderables were discussed.  The patient acknowledged the explanation, agreed to proceed  with the plan and consent was signed. Patient is being admitted for inpatient treatment for surgery, pain control, PT, OT, prophylactic antibiotics, VTE prophylaxis, progressive ambulation and ADL's and discharge planning.The patient is planning to be discharged home with home health services

## 2016-05-07 NOTE — Anesthesia Postprocedure Evaluation (Signed)
Anesthesia Post Note  Patient: Karina Green  Procedure(s) Performed: Procedure(s) (LRB): TOTAL HIP ARTHROPLASTY ANTERIOR APPROACH (Left)  Patient location during evaluation: PACU Anesthesia Type: Spinal Level of consciousness: awake and alert and oriented Pain management: pain level controlled Vital Signs Assessment: post-procedure vital signs reviewed and stable Respiratory status: spontaneous breathing, nonlabored ventilation and respiratory function stable Cardiovascular status: blood pressure returned to baseline and stable Postop Assessment: no headache, no backache, spinal receding, patient able to bend at knees and no signs of nausea or vomiting Anesthetic complications: no    Last Vitals:  Filed Vitals:   05/07/16 1545 05/07/16 1555  BP: 114/69   Pulse: 87 71  Temp:    Resp: 19 16    Last Pain:  Filed Vitals:   05/07/16 1556  PainSc: Asleep    LLE Motor Response: Purposeful movement (05/07/16 1545)   RLE Motor Response: Purposeful movement (05/07/16 1545)   L Sensory Level: L3-Anterior knee, lower leg (05/07/16 1545) R Sensory Level: L4-Anterior knee, lower leg (05/07/16 1545)  Criston Chancellor A.

## 2016-05-07 NOTE — Transfer of Care (Signed)
Immediate Anesthesia Transfer of Care Note  Patient: Karina Green  Procedure(s) Performed: Procedure(s): TOTAL HIP ARTHROPLASTY ANTERIOR APPROACH (Left)  Patient Location: PACU  Anesthesia Type:Spinal  Level of Consciousness:  sedated, patient cooperative and responds to stimulation  Airway & Oxygen Therapy:Patient Spontanous Breathing and Patient connected to face mask oxgen  Post-op Assessment:  Report given to PACU RN and Post -op Vital signs reviewed and stable  Post vital signs:  Reviewed and stable  Last Vitals:  Filed Vitals:   05/07/16 1533 05/07/16 1539  BP: 104/57   Pulse: 87 89  Temp: 36.6 C   Resp: 15 17    Complications: No apparent anesthesia complications

## 2016-05-07 NOTE — Anesthesia Preprocedure Evaluation (Signed)
Anesthesia Evaluation  Patient identified by MRN, date of birth, ID band Patient awake    Reviewed: Allergy & Precautions, H&P , NPO status , Patient's Chart, lab work & pertinent test results  Airway Mallampati: II  TM Distance: <3 FB Neck ROM: Full    Dental no notable dental hx. (+) Teeth Intact, Dental Advisory Given   Pulmonary neg pulmonary ROS,    Pulmonary exam normal breath sounds clear to auscultation       Cardiovascular hypertension, Pt. on medications + angina + CAD   Rhythm:Regular Rate:Normal + Systolic murmurs Study Conclusions  - Left ventricle: The cavity size was normal. Wall thickness was increased in a pattern of mild LVH. Systolic function was normal. The estimated ejection fraction was in the range of 60% to 65%. Diastolic function is abnormal, indeterminant grade. Wall motion was normal; there were no regional wall motion abnormalities. - Aortic valve: Moderately calcified annulus. Trileaflet; mildly thickened leaflets. There is aortic sclerosis without significant stenosis. There was trivial regurgitation. Valve area (VTI): 1.79 cm^2. Valve area (Vmax): 1.82 cm^2. - Mitral valve: Mildly calcified annulus. Mildly thickened leaflets   Neuro/Psych negative neurological ROS  negative psych ROS   GI/Hepatic negative GI ROS, Neg liver ROS,   Endo/Other  diabetes, Insulin DependentMorbid obesity  Renal/GU Renal InsufficiencyRenal disease  negative genitourinary   Musculoskeletal negative musculoskeletal ROS (+)   Abdominal   Peds negative pediatric ROS (+)  Hematology  (+) anemia ,   Anesthesia Other Findings   Reproductive/Obstetrics negative OB ROS                             Anesthesia Physical  Anesthesia Plan  ASA: III  Anesthesia Plan: MAC and Spinal   Post-op Pain Management:    Induction:   Airway Management Planned: Natural  Airway  Additional Equipment:   Intra-op Plan:   Post-operative Plan:   Informed Consent: I have reviewed the patients History and Physical, chart, labs and discussed the procedure including the risks, benefits and alternatives for the proposed anesthesia with the patient or authorized representative who has indicated his/her understanding and acceptance.   Dental advisory given  Plan Discussed with: CRNA and Surgeon  Anesthesia Plan Comments:         Anesthesia Quick Evaluation

## 2016-05-07 NOTE — Discharge Instructions (Signed)
°Dr. Jaimi Belle °Joint Replacement Specialist °[facility] °[address] °[place], [place] [address] °[phone] ° ° °TOTAL HIP REPLACEMENT POSTOPERATIVE DIRECTIONS ° ° ° °Hip Rehabilitation, Guidelines Following Surgery  ° °WEIGHT BEARING °Weight bearing as tolerated with assist device (walker, cane, etc) as directed, use it as long as suggested by your surgeon or therapist, typically at least 4-6 weeks. ° °The results of a hip operation are greatly improved after range of motion and muscle strengthening exercises. Follow all safety measures which are given to protect your hip. If any of these exercises cause increased pain or swelling in your joint, decrease the amount until you are comfortable again. Then slowly increase the exercises. Call your caregiver if you have problems or questions.  ° °HOME CARE INSTRUCTIONS  °Most of the following instructions are designed to prevent the dislocation of your new hip.  °Remove items at home which could result in a fall. This includes throw rugs or furniture in walking pathways.  °Continue medications as instructed at time of discharge. °· You may have some home medications which will be placed on hold until you complete the course of blood thinner medication. °· You may start showering once you are discharged home. Do not remove your dressing. °Do not put on socks or shoes without following the instructions of your caregivers.   °Sit on chairs with arms. Use the chair arms to help push yourself up when arising.  °Arrange for the use of a toilet seat elevator so you are not sitting low.  °· Walk with walker as instructed.  °You may resume a sexual relationship in one month or when given the OK by your caregiver.  °Use walker as long as suggested by your caregivers.  °You may put full weight on your legs and walk as much as is comfortable. °Avoid periods of inactivity such as sitting longer than an hour when not asleep. This helps prevent  blood clots.  °You may return to work once you are cleared by your surgeon.  °Do not drive a car for 6 weeks or until released by your surgeon.  °Do not drive while taking narcotics.  °Wear elastic stockings for two weeks following surgery during the day but you may remove then at night.  °Make sure you keep all of your appointments after your operation with all of your doctors and caregivers. You should call the office at the above phone number and make an appointment for approximately two weeks after the date of your surgery. °Please pick up a stool softener and laxative for home use as long as you are requiring pain medications. °· ICE to the affected hip every three hours for 30 minutes at a time and then as needed for pain and swelling. Continue to use ice on the hip for pain and swelling from surgery. You may notice swelling that will progress down to the foot and ankle.  This is normal after surgery.  Elevate the leg when you are not up walking on it.   °It is important for you to complete the blood thinner medication as prescribed by your doctor. °· Continue to use the breathing machine which will help keep your temperature down.  It is common for your temperature to cycle up and down following surgery, especially at night when you are not up moving around and exerting yourself.  The breathing machine keeps your lungs expanded and your temperature down. ° °RANGE OF MOTION AND STRENGTHENING EXERCISES  °These exercises are   designed to help you keep full movement of your hip joint. Follow your caregiver's or physical therapist's instructions. Perform all exercises about fifteen times, three times per day or as directed. Exercise both hips, even if you have had only one joint replacement. These exercises can be done on a training (exercise) mat, on the floor, on a table or on a bed. Use whatever works the best and is most comfortable for you. Use music or television while you are exercising so that the exercises  are a pleasant break in your day. This will make your life better with the exercises acting as a break in routine you can look forward to.  °Lying on your back, slowly slide your foot toward your buttocks, raising your knee up off the floor. Then slowly slide your foot back down until your leg is straight again.  °Lying on your back spread your legs as far apart as you can without causing discomfort.  °Lying on your side, raise your upper leg and foot straight up from the floor as far as is comfortable. Slowly lower the leg and repeat.  °Lying on your back, tighten up the muscle in the front of your thigh (quadriceps muscles). You can do this by keeping your leg straight and trying to raise your heel off the floor. This helps strengthen the largest muscle supporting your knee.  °Lying on your back, tighten up the muscles of your buttocks both with the legs straight and with the knee bent at a comfortable angle while keeping your heel on the floor.  ° °SKILLED REHAB INSTRUCTIONS: °If the patient is transferred to a skilled rehab facility following release from the hospital, a list of the current medications will be sent to the facility for the patient to continue.  When discharged from the skilled rehab facility, please have the facility set up the patient's Home Health Physical Therapy prior to being released. Also, the skilled facility will be responsible for providing the patient with their medications at time of release from the facility to include their pain medication and their blood thinner medication. If the patient is still at the rehab facility at time of the two week follow up appointment, the skilled rehab facility will also need to assist the patient in arranging follow up appointment in our office and any transportation needs. ° °MAKE SURE YOU:  °Understand these instructions.  °Will watch your condition.  °Will get help right away if you are not doing well or get worse. ° °Pick up stool softner and  laxative for home use following surgery while on pain medications. °Do not remove your dressing. °The dressing is waterproof--it is OK to take showers. °Continue to use ice for pain and swelling after surgery. °Do not use any lotions or creams on the incision until instructed by your surgeon. °Total Hip Protocol. ° ° °

## 2016-05-07 NOTE — Interval H&P Note (Signed)
History and Physical Interval Note:  05/07/2016 12:34 PM  Karina Green  has presented today for surgery, with the diagnosis of [name]  The various methods of treatment have been discussed with the patient and family. After consideration of risks, benefits and other options for treatment, the patient has consented to  Procedure(s): TOTAL HIP ARTHROPLASTY ANTERIOR APPROACH (Left) as a surgical intervention .  The patient's history has been reviewed, patient examined, no change in status, stable for surgery.  I have reviewed the patient's chart and labs.  Questions were answered to the patient's satisfaction.     Brayleigh Rybacki, Horald Pollen

## 2016-05-07 NOTE — Op Note (Signed)
OPERATIVE REPORT  SURGEON: Rod Can, MD   ASSISTANT: Nehemiah Massed, PA-C.   PREOPERATIVE DIAGNOSIS: Left hip arthritis.   POSTOPERATIVE DIAGNOSIS: Left hip arthritis.   PROCEDURE: Left total hip arthroplasty, anterior approach.   IMPLANTS: DePuy Tri Lock stem, size 5, std offset. DePuy Pinnacle Cup, size 58 mm. DePuy Altrx liner, size 36 by 58 mm, +4 neutral. DePuy Biolox ceramic head ball, size 36 + 12 mm. 6.5 mm cancellous bone screw 1.  ANESTHESIA:  Spinal  ESTIMATED BLOOD LOSS: 350 mL.  ANTIBIOTICS: 2 g Ancef.  DRAINS: None.  COMPLICATIONS: None.   CONDITION: PACU - hemodynamically stable.  BRIEF CLINICAL NOTE: Karina Green is a 60 y.o. female with a long-standing history of Left hip arthritis. After failing conservative management, the patient was indicated for total hip arthroplasty. The risks, benefits, and alternatives to the procedure were explained, and the patient elected to proceed.  PROCEDURE IN DETAIL: Surgical site was marked by myself. Spinal anesthesia was obtained in the pre-op holding area. Once inside the operative room, a foley catheter was inserted. The patient was then positioned on the Hana table. All bony prominences were well padded. The hip was prepped and draped in the normal sterile surgical fashion. A time-out was called verifying side and site of surgery. The patient received IV antibiotics within 60 minutes of beginning the procedure.  The direct anterior approach to the hip was performed through the Hueter interval. Lateral femoral circumflex vessels were treated with the Auqumantys. The anterior capsule was exposed and an inverted T capsulotomy was made.The femoral neck cut was made to the level of the templated cut. A corkscrew was placed into the head and the head was removed. The femoral head was found to have eburnated bone. The head was passed to the back table and was measured.  Acetabular exposure was achieved, and  the pulvinar and labrum were excised. Sequental reaming of the acetabulum was then performed up to a size 57 mm reamer. A 58 mm cup was then opened and impacted into place at approximately 40 degrees of abduction and 20 degrees of anteversion. I chose to augment the already acceptable fixation with one 6.5 mm cancellus bone screw. The final polyethylene liner was impacted into place and acetabular osteophytes were removed.   I then gained femoral exposure taking care to protect the abductors and greater trochanter. This was performed using standard external rotation, extension, and adduction. The capsule was peeled off the inner aspect of the greater trochanter, taking care to preserve the short external rotators. A cookie cutter was used to enter the femoral canal, and then the femoral canal finder was placed. Sequential broaching was performed up to a size 5. Calcar planer was used on the femoral neck remnant. I placed a standard offset neck and a trial head ball. The hip was reduced. Leg lengths and offset were checked fluoroscopically. The hip was dislocated and trial components were removed. The final implants were placed, and the hip was reduced.  Fluoroscopy was used to confirm component position and leg lengths. At 90 degrees of external rotation and full extension, the hip was stable to an anterior directed force.  The wound was copiously irrigated with a dilute betadine solution followed by normal saline. Marcaine solution was injected into the periarticular soft tissue. The wound was closed in layers using #1 Vicryl and V-Loc for the fascia, 2-0 Vicryl for the subcutaneous fat, 2-0 Monocryl for the deep dermal layer, 3-0 running Monocryl subcuticular stitch, and Dermabond  for the skin. Once the glue was fully dried, an Aquacell Ag dressing was applied. The patient was transported to the recovery room in stable condition. Sponge, needle, and instrument counts were correct at the end of  the case x2. The patient tolerated the procedure well and there were no known complications.  Please note that a surgical assistant was a medical necessity for this procedure to perform it in a safe and expeditious manner. Assistant was necessary to provide appropriate retraction of vital neurovascular structures, to prevent femoral fracture, and to allow for anatomic placement of the prosthesis.

## 2016-05-08 LAB — BASIC METABOLIC PANEL
Anion gap: 8 (ref 5–15)
BUN: 23 mg/dL — AB (ref 6–20)
CALCIUM: 8 mg/dL — AB (ref 8.9–10.3)
CO2: 26 mmol/L (ref 22–32)
Chloride: 106 mmol/L (ref 101–111)
Creatinine, Ser: 1.17 mg/dL — ABNORMAL HIGH (ref 0.44–1.00)
GFR calc Af Amer: 57 mL/min — ABNORMAL LOW (ref >60)
GFR, EST NON AFRICAN AMERICAN: 49 mL/min — AB (ref >60)
GLUCOSE: 145 mg/dL — AB (ref 65–99)
Potassium: 3.5 mmol/L (ref 3.5–5.1)
Sodium: 140 mmol/L (ref 135–145)

## 2016-05-08 LAB — CBC
HCT: 28.1 % — ABNORMAL LOW (ref 36.0–46.0)
Hemoglobin: 9 g/dL — ABNORMAL LOW (ref 12.0–15.0)
MCH: 26.2 pg (ref 26.0–34.0)
MCHC: 32 g/dL (ref 30.0–36.0)
MCV: 81.7 fL (ref 78.0–100.0)
PLATELETS: 183 10*3/uL (ref 150–400)
RBC: 3.44 MIL/uL — ABNORMAL LOW (ref 3.87–5.11)
RDW: 16.2 % — AB (ref 11.5–15.5)
WBC: 9.6 10*3/uL (ref 4.0–10.5)

## 2016-05-08 MED ORDER — ASPIRIN 81 MG PO TABS: 81.0000 mg | ORAL_TABLET | Freq: Two times a day (BID) | ORAL | Status: DC

## 2016-05-08 MED ORDER — METHOCARBAMOL 500 MG PO TABS: 500.0000 mg | ORAL_TABLET | Freq: Four times a day (QID) | ORAL | Status: DC | PRN

## 2016-05-08 MED ORDER — DOCUSATE SODIUM 100 MG PO CAPS: 100.0000 mg | ORAL_CAPSULE | Freq: Two times a day (BID) | ORAL | Status: DC

## 2016-05-08 MED ORDER — HYDROCODONE-ACETAMINOPHEN 5-325 MG PO TABS: 1.0000 | ORAL_TABLET | ORAL | Status: DC | PRN

## 2016-05-08 MED ORDER — SENNA 8.6 MG PO TABS: 2.0000 | ORAL_TABLET | Freq: Every day | ORAL | Status: DC

## 2016-05-08 MED ORDER — ONDANSETRON HCL 4 MG PO TABS: 4.0000 mg | ORAL_TABLET | Freq: Four times a day (QID) | ORAL | Status: DC | PRN

## 2016-05-08 NOTE — Progress Notes (Signed)
Pt to d/c home with [facility]. DME (RW) delivered to room prior to d/c. AVS reviewed and "My Chart" discussed with pt. Pt capable of verbalizing medications, signs and symptoms of infection, and follow-up appointments. Remains hemodynamically stable. No signs and symptoms of distress. Educated pt to return to ER in the case of SOB, dizziness, or chest pain.

## 2016-05-08 NOTE — Progress Notes (Signed)
[facility]    [facility] is providing the following services: Gilford Rile  If patient discharges after hours, please call [phone].   Linward Headland 05/08/2016, 11:53 AM

## 2016-05-08 NOTE — Progress Notes (Signed)
   Subjective:  Patient reports pain as mild to moderate.  No c/o. Denies N/V/CP/SOB.  Objective:   VITALS:   Filed Vitals:   05/07/16 1910 05/07/16 2123 05/08/16 0241 05/08/16 0603  BP: 134/49 114/46 122/63 93/56  Pulse: 62 59 65 66  Temp: 98 F (36.7 C) 98 F (36.7 C) 98.6 F (37 C) 98.6 F (37 C)  TempSrc: Oral Oral Oral Oral  Resp: 16 16 16 16   Height:      Weight:      SpO2: 100% 100% 99% 100%    ABD soft Sensation intact distally Intact pulses distally Dorsiflexion/Plantar flexion intact Incision: dressing C/D/I Compartment soft   Lab Results  Component Value Date   WBC 9.6 05/08/2016   HGB 9.0* 05/08/2016   HCT 28.1* 05/08/2016   MCV 81.7 05/08/2016   PLT 183 05/08/2016   BMET    Component Value Date/Time   NA 140 05/08/2016 0350   K 3.5 05/08/2016 0350   CL 106 05/08/2016 0350   CO2 26 05/08/2016 0350   GLUCOSE 145* 05/08/2016 0350   BUN 23* 05/08/2016 0350   CREATININE 1.17* 05/08/2016 0350   CALCIUM 8.0* 05/08/2016 0350   GFRNONAA 49* 05/08/2016 0350   GFRAA 57* 05/08/2016 0350     Assessment/Plan: 1 Day Post-Op   Principal Problem:   Primary osteoarthritis of left hip   WBAT with walker DVT ppx: ASA, SCDs, TEDs CKD III: stable, monitor PO pain control PT/OT D/C home after clears PT    Kalila Adkison, Horald Pollen 05/08/2016, 7:34 AM   Rod Can, MD Cell [phone]

## 2016-05-08 NOTE — Progress Notes (Signed)
Physical Therapy Treatment Patient Details Name: Karina Green MRN: [id number] DOB: May 29, 1956 Today's Date: 05/08/2016    History of Present Illness s/p L DA THA, h/o THA '09, posterior approach    PT Comments    Pt progressing well with mobility and eager for dc home.  Reviewed stairs and car transfers with pt and friend.  Follow Up Recommendations  Home health PT     Equipment Recommendations  Rolling walker with 5" wheels    Recommendations for Other Services OT consult     Precautions / Restrictions Precautions Precautions: Fall Restrictions Weight Bearing Restrictions: No Other Position/Activity Restrictions: WBAT    Mobility  Bed Mobility               General bed mobility comments: NT - pt states she sleeps in recliner  Transfers Overall transfer level: Needs assistance Equipment used: Rolling walker (2 wheeled) Transfers: Sit to/from Stand Sit to Stand: Supervision         General transfer comment: cues for UE/LE placement  Ambulation/Gait Ambulation/Gait assistance: Min guard;Supervision Ambulation Distance (Feet): 125 Feet Assistive device: Rolling walker (2 wheeled) Gait Pattern/deviations: Step-to pattern;Step-through pattern;Decreased step length - right;Decreased step length - left;Shuffle;Trunk flexed Gait velocity: decr   General Gait Details: cues for posture, position from RW and initial sequence   Stairs Stairs: Yes Stairs assistance: Min assist Stair Management: No rails;Step to pattern;Backwards;With walker Number of Stairs: 4 General stair comments: 2 steps twice bkwd with RW and cues for sequence and foot/RW placement.  Reviewed additional time with friend on arrival and provided written instructions  Wheelchair Mobility    Modified Rankin (Stroke Patients Only)       Balance                                    Cognition Arousal/Alertness: Awake/alert Behavior During Therapy: WFL for tasks  assessed/performed Overall Cognitive Status: Within Functional Limits for tasks assessed                      Exercises      General Comments        Pertinent Vitals/Pain Pain Assessment: 0-10 Pain Score: 3  Pain Location: L hip Pain Descriptors / Indicators: Aching;Sore Pain Intervention(s): Monitored during session;Limited activity within patient's tolerance;Premedicated before session    Home Living                      Prior Function            PT Goals (current goals can now be found in the care plan section) Acute Rehab PT Goals Patient Stated Goal: regain independence PT Goal Formulation: With patient Time For Goal Achievement: 05/08/16 Potential to Achieve Goals: Good Progress towards PT goals: Progressing toward goals    Frequency  7X/week    PT Plan Current plan remains appropriate    Co-evaluation             End of Session Equipment Utilized During Treatment: Gait belt Activity Tolerance: Patient tolerated treatment well Patient left: in chair;with call bell/phone within reach     Time: 1341-1405 PT Time Calculation (min) (ACUTE ONLY): 24 min  Charges:  $Gait Training: 8-22 mins $Therapeutic Activity: 8-22 mins                    G Codes:      Auguste Tebbetts  05/08/2016, 3:05 PM

## 2016-05-08 NOTE — Discharge Summary (Signed)
Physician Discharge Summary  Patient ID: Karina Green MRN: [id number] DOB/AGE: 62/19/1957 62 y.o.  Admit date: 05/07/2016 Discharge date: 05/08/2016  Admission Diagnoses:  Primary osteoarthritis of left hip  Discharge Diagnoses:  Principal Problem:   Primary osteoarthritis of left hip   Past Medical History  Diagnosis Date  . Hypertension   . Back pain   . Arthritis   . Hyperlipidemia   . CAD (coronary artery disease), native coronary artery-70% LM stenosis 10/25/2014  . DM type 2 (diabetes mellitus, type 2) ([place]) 10/25/14  . Gout   . Cough last 30 yrs     nonproductive, no fevers    Surgeries: Procedure(s): TOTAL HIP ARTHROPLASTY ANTERIOR APPROACH on 05/07/2016   Consultants (if any):    Discharged Condition: Improved  Hospital Course: Karina Green is an 60 y.o. female who was admitted 05/07/2016 with a diagnosis of Primary osteoarthritis of left hip and went to the operating room on 05/07/2016 and underwent the above named procedures.    She was given perioperative antibiotics:      Anti-infectives    Start     Dose/Rate Route Frequency Ordered Stop   05/07/16 1900  ceFAZolin (ANCEF) IVPB 2g/100 mL premix     2 g 200 mL/hr over 30 Minutes Intravenous Every 6 hours 05/07/16 1623 05/08/16 0313   05/07/16 1100  ceFAZolin (ANCEF) IVPB 2g/100 mL premix     2 g 200 mL/hr over 30 Minutes Intravenous On call to O.R. 05/07/16 1044 05/07/16 1300    .  She was given sequential compression devices, early ambulation, and ASA for DVT prophylaxis.  She benefited maximally from the hospital stay and there were no complications.    Recent vital signs:  Filed Vitals:   05/08/16 0840 05/08/16 1101  BP: 126/63 95/48  Pulse: 70 66  Temp:    Resp:  18    Recent laboratory studies:  Lab Results  Component Value Date   HGB 9.0* 05/08/2016   HGB 12.8 05/01/2016   HGB 10.2* 06/20/2015   Lab Results  Component Value Date   WBC 9.6 05/08/2016   PLT 183 05/08/2016   Lab  Results  Component Value Date   INR 1.46 10/29/2014   Lab Results  Component Value Date   NA 140 05/08/2016   K 3.5 05/08/2016   CL 106 05/08/2016   CO2 26 05/08/2016   BUN 23* 05/08/2016   CREATININE 1.17* 05/08/2016   GLUCOSE 145* 05/08/2016    Discharge Medications:     Medication List    STOP taking these medications        acetaminophen 500 MG tablet  Commonly known as:  TYLENOL     aspirin EC 81 MG tablet  Replaced by:  aspirin 81 MG tablet     naproxen sodium 220 MG tablet  Commonly known as:  ANAPROX     traMADol 50 MG tablet  Commonly known as:  ULTRAM      TAKE these medications        ALPRAZolam 0.5 MG tablet  Commonly known as:  XANAX  Take 0.5 mg by mouth daily as needed. Panic attacks/anxiety.     aspirin 81 MG tablet  Take 1 tablet (81 mg total) by mouth 2 (two) times daily after a meal.     atorvastatin 80 MG tablet  Commonly known as:  LIPITOR  TAKE ONE TABLET BY MOUTH ONCE DAILY AT 6 P.M.     docusate sodium 100 MG capsule  Commonly  known as:  COLACE  Take 1 capsule (100 mg total) by mouth 2 (two) times daily.     furosemide 20 MG tablet  Commonly known as:  LASIX  Take 1 tablet (20 mg total) by mouth daily.     HYDROcodone-acetaminophen 5-325 MG tablet  Commonly known as:  NORCO  Take 1-2 tablets by mouth every 4 (four) hours as needed for moderate pain.     losartan 25 MG tablet  Commonly known as:  COZAAR  Take 0.5 tablets (12.5 mg total) by mouth daily.     methocarbamol 500 MG tablet  Commonly known as:  ROBAXIN  Take 1 tablet (500 mg total) by mouth every 6 (six) hours as needed for muscle spasms.     metoprolol tartrate 25 MG tablet  Commonly known as:  LOPRESSOR  Take 0.5 tablets (12.5 mg total) by mouth 2 (two) times daily.     ondansetron 4 MG tablet  Commonly known as:  ZOFRAN  Take 1 tablet (4 mg total) by mouth every 6 (six) hours as needed for nausea.     senna 8.6 MG Tabs tablet  Commonly known as:  SENOKOT   Take 2 tablets (17.2 mg total) by mouth at bedtime.        Diagnostic Studies: Dg Pelvis Portable  05/07/2016  CLINICAL DATA:  Status post left hip replacement today. Postoperative film. EXAM: PORTABLE PELVIS 1-2 VIEWS COMPARISON:  Plain films left hip 08/05/2015. FINDINGS: The patient has a new left total hip replacement. The device is located. No fracture is seen. Gas in the soft tissues from surgery is noted. Right hip replacement seen on the prior exam is again noted. IMPRESSION: Status post left hip replacement without evidence of complication. No acute abnormality. Electronically Signed   By: Inge Rise M.D.   On: 05/07/2016 15:53   Dg C-arm 61-120 Min-no Report  05/07/2016  CLINICAL DATA: hip C-ARM 61-120 MINUTES Fluoroscopy was utilized by the requesting physician.  No radiographic interpretation.   Dg Hip Operative Unilat With Pelvis Left  05/07/2016  CLINICAL DATA:  Left hip arthroplasty. EXAM: OPERATIVE LEFT HIP (WITH PELVIS IF PERFORMED)  VIEWS TECHNIQUE: Fluoroscopic spot image(s) were submitted for interpretation post-operatively. COMPARISON:  08/05/2015 FINDINGS: There has been a total left hip arthroplasty with screwed in acetabular component and short stem femoral component. The alignment of the hardware is normal. There is no evidence of immediate complications. Fluoroscopy time is reported at 0.5 minutes. IMPRESSION: Status post total left hip arthroplasty without evidence of immediate complications. Electronically Signed   By: Fidela Salisbury M.D.   On: 05/07/2016 15:11    Disposition: 01-Home or Self Care  Discharge Instructions    Call MD / Call 911    Complete by:  As directed   If you experience chest pain or shortness of breath, CALL 911 and be transported to the hospital emergency room.  If you develope a fever above 101 F, pus (white drainage) or increased drainage or redness at the wound, or calf pain, call your surgeon's office.     Constipation  Prevention    Complete by:  As directed   Drink plenty of fluids.  Prune juice may be helpful.  You may use a stool softener, such as Colace (over the counter) 100 mg twice a day.  Use MiraLax (over the counter) for constipation as needed.     Diet - low sodium heart healthy    Complete by:  As directed  Driving restrictions    Complete by:  As directed   No driving for 6 weeks     Increase activity slowly as tolerated    Complete by:  As directed      Lifting restrictions    Complete by:  As directed   No lifting for 6 weeks     TED hose    Complete by:  As directed   Use stockings (TED hose) for 2 weeks on both leg(s).  You may remove them at night for sleeping.           Follow-up Information    Follow up with Dangela How, Horald Pollen, MD. Schedule an appointment as soon as possible for a visit in 2 weeks.   Specialty:  Orthopedic Surgery   Why:  For wound re-check   Contact information:   [address] [address] [phone]       Follow up with [facility].   Why:  physical therapy   Contact information:   [place]  [id number] [phone]       Follow up with [facility].   Why:  rolling walker   Contact information:   [place] [address] [phone]        Signed: Elie Goody 05/08/2016, 8:32 PM

## 2016-05-08 NOTE — Care Management Note (Signed)
Case Management Note  Patient Details  Name: Karina Green MRN: [id number] Date of Birth: 09/17/1956  Subjective/Objective:      S/p Left total hip arthroplasty, anterior approach              Action/Plan: Discharge planning, spoke with patient and spouse at bedside. Have chosen [facility] for [facility]. Contacted [facility] for referral. Needs RW, declines 3-n-1, contacted [facility] to deliver to room  Expected Discharge Date:                  Expected Discharge Plan:  [place]  In-House Referral:  NA  Discharge planning Services  CM Consult  Post Acute Care Choice:  Durable Medical Equipment, Home Health Choice offered to:  Patient  DME Arranged:  Walker rolling DME Agency:  [place] Arranged:  PT [place]:  [place]  Status of Service:  Completed, signed off  Medicare Important Message Given:    Date Medicare IM Given:    Medicare IM give by:    Date Additional Medicare IM Given:    Additional Medicare Important Message give by:     If discussed at [place] of Stay Meetings, dates discussed:    Additional Comments:  Guadalupe Maple, RN 05/08/2016, 10:24 AM [phone]

## 2016-05-08 NOTE — Evaluation (Signed)
Physical Therapy Evaluation Patient Details Name: Karina Green MRN: [id number] DOB: [birth year] Today's Date: 05/08/2016   History of Present Illness  s/p L DA THA, h/o THA '09, posterior approach  Clinical Impression  Pt s/p L THR presents with decreased L LE strength/ROM and post op pain limiting functional mobility.  Pt should progress to dc home with assist of friends and HHPT follow up.    Follow Up Recommendations Home health PT    Equipment Recommendations  Rolling walker with 5" wheels    Recommendations for Other Services OT consult     Precautions / Restrictions Precautions Precautions: Fall Restrictions Weight Bearing Restrictions: No Other Position/Activity Restrictions: WBAT      Mobility  Bed Mobility Overal bed mobility: Needs Assistance Bed Mobility: Supine to Sit     Supine to sit: Min assist     General bed mobility comments: cues for sequence and use of R LE to self assist  Transfers Overall transfer level: Needs assistance Equipment used: Rolling walker (2 wheeled) Transfers: Sit to/from Stand Sit to Stand: Min assist         General transfer comment: cues for LE management and use of UEs to self assist  Ambulation/Gait Ambulation/Gait assistance: Min assist;Min guard Ambulation Distance (Feet): 95 Feet Assistive device: Rolling walker (2 wheeled) Gait Pattern/deviations: Step-to pattern;Step-through pattern;Decreased step length - right;Decreased step length - left;Shuffle;Trunk flexed Gait velocity: decr   General Gait Details: cues for posture, position from RW and initial sequence  Stairs            Wheelchair Mobility    Modified Rankin (Stroke Patients Only)       Balance                                             Pertinent Vitals/Pain Pain Assessment: 0-10 Pain Score: 3  Pain Location: L hip Pain Descriptors / Indicators: Aching Pain Intervention(s): Limited activity within patient's  tolerance;Monitored during session;Repositioned;Ice applied    Home Living Family/patient expects to be discharged to:: Private residence Living Arrangements: Non-relatives/Friends Available Help at Discharge: Friend(s) Type of Home: House Home Access: Stairs to enter Entrance Stairs-Rails: None Entrance Stairs-Number of Steps: 2 Home Layout: One level Home Equipment: Bedside commode Additional Comments: had borrowed tub bench last time, no longer has this    Prior Function Level of Independence: Independent               Hand Dominance        Extremity/Trunk Assessment   Upper Extremity Assessment: Overall WFL for tasks assessed           Lower Extremity Assessment: LLE deficits/detail   LLE Deficits / Details: Strength at hip 2+/5 with AAROM at hip to 80 flex and 15 abd  Cervical / Trunk Assessment: Normal  Communication   Communication: No difficulties  Cognition Arousal/Alertness: Awake/alert Behavior During Therapy: WFL for tasks assessed/performed Overall Cognitive Status: Within Functional Limits for tasks assessed                      General Comments      Exercises Total Joint Exercises Ankle Circles/Pumps: AROM;Both;15 reps;Supine Quad Sets: AROM;Both;10 reps;Supine Heel Slides: AAROM;Right;20 reps;Supine Hip ABduction/ADduction: AAROM;Right;15 reps;Supine      Assessment/Plan    PT Assessment Patient needs continued PT services  PT Diagnosis Difficulty walking  PT Problem List Decreased strength;Decreased range of motion;Decreased activity tolerance;Decreased mobility;Decreased knowledge of use of DME;Pain;Obesity  PT Treatment Interventions DME instruction;Gait training;Stair training;Functional mobility training;Therapeutic activities;Therapeutic exercise;Patient/family education   PT Goals (Current goals can be found in the Care Plan section) Acute Rehab PT Goals Patient Stated Goal: Regain IND  PT Goal Formulation: With  patient Time For Goal Achievement: May 20, 2016 Potential to Achieve Goals: Good    Frequency 7X/week   Barriers to discharge        Co-evaluation               End of Session Equipment Utilized During Treatment: Gait belt Activity Tolerance: Patient tolerated treatment well;Patient limited by fatigue Patient left: in chair;with call bell/phone within reach Nurse Communication: Mobility status         Time: [id number] PT Time Calculation (min) (ACUTE ONLY): 31 min   Charges:   PT Evaluation $PT Eval Low Complexity: 1 Procedure PT Treatments $Therapeutic Exercise: 8-22 mins   PT G Codes:        Mikiya Nebergall 05/20/2016, 11:04 AM

## 2016-05-08 NOTE — Evaluation (Signed)
Occupational Therapy Evaluation Patient Details Name: Karina Green MRN: [id number] DOB: September 14, 1956 Today's Date: 05/08/2016    History of Present Illness s/p L DA THA, h/o THA '09, posterior approach   Clinical Impression   This 60 year old female was admitted for the above surgery. All education was completed. No further OT is needed at this time    Follow Up Recommendations  No OT follow up    Equipment Recommendations  None recommended by OT    Recommendations for Other Services       Precautions / Restrictions Precautions Precautions: Fall Restrictions Weight Bearing Restrictions: No Other Position/Activity Restrictions: WBAT      Mobility Bed Mobility Overal bed mobility: Needs Assistance Bed Mobility: Supine to Sit     Supine to sit: Min assist     General bed mobility comments: oob  Transfers Overall transfer level: Needs assistance Equipment used: Rolling walker (2 wheeled) Transfers: Sit to/from Stand Sit to Stand: Min guard         General transfer comment: cues for UE/LE placement    Balance                                            ADL Overall ADL's : Needs assistance/impaired     Grooming: Oral care;Supervision/safety;Standing                   Toilet Transfer: Min guard;Ambulation;BSC;RW             General ADL Comments: pt has catheter in place; practiced transfer onto commode.  She can perform UB adls with set up and has reacher for LB ADLs:  min guard for LB bathing and mod A for LB dressing as she will have friend help with socks.  DIscussed tub transfer:  she cannot recall who she borrowed tub bench from, but she is fine with sponge bathing until she can tolerate stepping over tub     Vision     Perception     Praxis      Pertinent Vitals/Pain Pain Assessment: 0-10 Pain Score: 3  Pain Location: L hip Pain Descriptors / Indicators: Aching Pain Intervention(s): Limited activity within  patient's tolerance;Monitored during session;Repositioned;Ice applied     Hand Dominance     Extremity/Trunk Assessment Upper Extremity Assessment Upper Extremity Assessment: Overall WFL for tasks assessed      Cervical / Trunk Assessment Cervical / Trunk Assessment: Normal   Communication Communication Communication: No difficulties   Cognition Arousal/Alertness: Awake/alert Behavior During Therapy: WFL for tasks assessed/performed Overall Cognitive Status: Within Functional Limits for tasks assessed                     General Comments       Exercises      Shoulder Instructions      Home Living Family/patient expects to be discharged to:: Private residence Living Arrangements: Non-relatives/Friends Available Help at Discharge: Friend(s) Type of Home: House Home Access: Stairs to enter Technical brewer of Steps: 2 Entrance Stairs-Rails: None Home Layout: One level     Bathroom Shower/Tub: Teacher, early years/pre: Standard     Home Equipment: Bedside commode   Additional Comments: had borrowed tub bench last time, no longer has this      Prior Functioning/Environment Level of Independence: Independent  OT Diagnosis: Acute pain   OT Problem List:     OT Treatment/Interventions:      OT Goals(Current goals can be found in the care plan section) Acute Rehab OT Goals Patient Stated Goal: regain independence OT Goal Formulation: All assessment and education complete, DC therapy  OT Frequency:     Barriers to D/C:            Co-evaluation              End of Session    Activity Tolerance: Patient tolerated treatment well Patient left: in chair;with call bell/phone within reach;with family/visitor present   Time: [id number] OT Time Calculation (min): 17 min Charges:  OT General Charges $OT Visit: 1 Procedure OT Evaluation $OT Eval Low Complexity: 1 Procedure G-Codes:     Karina Green 2016/05/22, Karina Green  Karina Green, OTR/L [id number] May 22, 2016

## 2016-11-30 ENCOUNTER — Ambulatory Visit: Payer: BLUE CROSS/BLUE SHIELD | Admitting: Cardiology

## 2016-12-10 ENCOUNTER — Ambulatory Visit: Payer: BLUE CROSS/BLUE SHIELD | Admitting: Cardiology

## 2016-12-29 ENCOUNTER — Ambulatory Visit (INDEPENDENT_AMBULATORY_CARE_PROVIDER_SITE_OTHER): Payer: BLUE CROSS/BLUE SHIELD | Admitting: Cardiology

## 2016-12-29 ENCOUNTER — Encounter: Payer: Self-pay | Admitting: *Deleted

## 2016-12-29 VITALS — BP 138/78 | HR 63 | Ht 63.0 in | Wt 235.0 lb

## 2016-12-29 DIAGNOSIS — I350 Nonrheumatic aortic (valve) stenosis: Secondary | ICD-10-CM

## 2016-12-29 DIAGNOSIS — E782 Mixed hyperlipidemia: Secondary | ICD-10-CM

## 2016-12-29 DIAGNOSIS — I10 Essential (primary) hypertension: Secondary | ICD-10-CM | POA: Diagnosis not present

## 2016-12-29 DIAGNOSIS — I2510 Atherosclerotic heart disease of native coronary artery without angina pectoris: Secondary | ICD-10-CM

## 2016-12-29 NOTE — Progress Notes (Signed)
Clinical Summary Karina Green is a 61 y.o.female seen for the following medical problems.  1. CAD - hx of CABG Nov 2015 (LIMA-LAD,SVG-OM) - 10/2014 echo LVEF 60-65%, abnormal diastolic function indeterminant grade,   - denies any chest pain. No SOB or DOE - compliant with meds  2. HTN - does not check at home reguarly - compliant with meds.    3. Hyperlipidemia Jan 2017 TC 181 TG 243 HDL 41 LDL 91 - reports upcoming panel with pcp  4. Aortic stenosis - echo 05/2015 mean grad 16, AVA 1.5. Overall mild AS - denies any significant symptoms since last visit.    Past Medical History:  Diagnosis Date  . Arthritis   . Back pain   . CAD (coronary artery disease), native coronary artery-70% LM stenosis 10/25/2014  . Cough last 30 yrs    nonproductive, no fevers  . DM type 2 (diabetes mellitus, type 2) ([place]) 10/25/14  . Gout   . Hyperlipidemia   . Hypertension      No Known Allergies   Current Outpatient Prescriptions  Medication Sig Dispense Refill  . ALPRAZolam (XANAX) 0.5 MG tablet Take 0.5 mg by mouth daily as needed. Panic attacks/anxiety.    Marland Kitchen aspirin 81 MG tablet Take 1 tablet (81 mg total) by mouth 2 (two) times daily after a meal. 60 tablet 1  . atorvastatin (LIPITOR) 80 MG tablet TAKE ONE TABLET BY MOUTH ONCE DAILY AT 6 P.M. 90 tablet 01  . docusate sodium (COLACE) 100 MG capsule Take 1 capsule (100 mg total) by mouth 2 (two) times daily. 60 capsule 3  . furosemide (LASIX) 20 MG tablet Take 1 tablet (20 mg total) by mouth daily. 90 tablet 1  . HYDROcodone-acetaminophen (NORCO) 5-325 MG tablet Take 1-2 tablets by mouth every 4 (four) hours as needed for moderate pain. 90 tablet 0  . losartan (COZAAR) 25 MG tablet Take 0.5 tablets (12.5 mg total) by mouth daily. 45 tablet 3  . methocarbamol (ROBAXIN) 500 MG tablet Take 1 tablet (500 mg total) by mouth every 6 (six) hours as needed for muscle spasms. 20 tablet 0  . metoprolol tartrate (LOPRESSOR) 25 MG tablet  Take 0.5 tablets (12.5 mg total) by mouth 2 (two) times daily. 90 tablet 1  . ondansetron (ZOFRAN) 4 MG tablet Take 1 tablet (4 mg total) by mouth every 6 (six) hours as needed for nausea. 20 tablet 0  . senna (SENOKOT) 8.6 MG TABS tablet Take 2 tablets (17.2 mg total) by mouth at bedtime. 60 each 3   No current facility-administered medications for this visit.      Past Surgical History:  Procedure Laterality Date  . ABDOMINAL HYSTERECTOMY     complete  . APPENDECTOMY    . bladder tack    . CARDIAC CATHETERIZATION  10/25/14   70% LM stensis, normal EF  . CHOLECYSTECTOMY    . CORONARY ARTERY BYPASS GRAFT N/A 10/29/2014   Procedure: CORONARY ARTERY BYPASS GRAFTING (CABG) TIMES TWO USING LEFT INTERNAL MAMMARY ARTERY AND RIGHT LEG GREATER SAPHENOUS VEIN HARVESTED ENDOSCOPICALLY.;  Surgeon: Ivin Poot, MD;  Location: [place];  Service: Open Heart Surgery;  Laterality: N/A;  . HERNIA REPAIR    . INTRAOPERATIVE TRANSESOPHAGEAL ECHOCARDIOGRAM N/A 10/29/2014   Procedure: INTRAOPERATIVE TRANSESOPHAGEAL ECHOCARDIOGRAM;  Surgeon: Ivin Poot, MD;  Location: [place];  Service: Open Heart Surgery;  Laterality: N/A;  . JOINT REPLACEMENT Right 2009   hip total  . LEFT HEART CATHETERIZATION WITH CORONARY  ANGIOGRAM N/A 10/26/2014   Procedure: LEFT HEART CATHETERIZATION WITH CORONARY ANGIOGRAM;  Surgeon: Troy Sine, MD;  Location: [facility] CATH LAB;  Service: Cardiovascular;  Laterality: N/A;  . TONSILLECTOMY    . TOTAL HIP ARTHROPLASTY    . TOTAL HIP ARTHROPLASTY Left 05/07/2016   Procedure: TOTAL HIP ARTHROPLASTY ANTERIOR APPROACH;  Surgeon: Rod Can, MD;  Location: [facility];  Service: Orthopedics;  Laterality: Left;     No Known Allergies    Family History  Problem Relation Age of Onset  . Hypertension Mother   . Coronary artery disease Mother     CABG  . Hypertension Father   . CAD Father     CABG  . Coronary artery disease Brother     Stent     Social History Ms. Thammavong reports  that she has never smoked. She has never used smokeless tobacco. Ms. Doelling reports that she does not drink alcohol.   Review of Systems CONSTITUTIONAL: No weight loss, fever, chills, weakness or fatigue.  HEENT: Eyes: No visual loss, blurred vision, double vision or yellow sclerae.No hearing loss, sneezing, congestion, runny nose or sore throat.  SKIN: No rash or itching.  CARDIOVASCULAR: per HPI RESPIRATORY: No shortness of breath, cough or sputum.  GASTROINTESTINAL: No anorexia, nausea, vomiting or diarrhea. No abdominal pain or blood.  GENITOURINARY: No burning on urination, no polyuria NEUROLOGICAL: No headache, dizziness, syncope, paralysis, ataxia, numbness or tingling in the extremities. No change in bowel or bladder control.  MUSCULOSKELETAL: No muscle, back pain, joint pain or stiffness.  LYMPHATICS: No enlarged nodes. No history of splenectomy.  PSYCHIATRIC: No history of depression or anxiety.  ENDOCRINOLOGIC: No reports of sweating, cold or heat intolerance. No polyuria or polydipsia.  Marland Kitchen   Physical Examination Vitals:   12/29/16 1348  BP: 138/78  Pulse: 63   Vitals:   12/29/16 1348  Weight: 235 lb (106.6 kg)  Height: 5\' 3"  (1.6 m)    Gen: resting comfortably, no acute distress HEENT: no scleral icterus, pupils equal round and reactive, no palptable cervical adenopathy,  CV: RRR, 2/6 systolic murmur RUSB, no jvd Resp: Clear to auscultation bilaterally GI: abdomen is soft, non-tender, non-distended, normal bowel sounds, no hepatosplenomegaly MSK: extremities are warm, no edema.  Skin: warm, no rash Neuro:  no focal deficits Psych: appropriate affect   Diagnostic Studies 10/2014 Cath HEMODYNAMICS:   Central Aorta: 140/66  Left Ventricle: 140/18  ANGIOGRAPHY:   The left main coronary artery was a large-caliber vessel which bifurcated into the LAD and left circumflex coronary artery. There was a 70% distal left main stenosis prior to its bifurcation  with a suggestion of a focal napkin ring like appearance. This did not improve following IC nitroglycerin administration.  The LAD was angiographically normal and gave rise to 2 major diagonal vessels and several septal perforating arteries. The vessel extended to the LV apex.   The left circumflex coronary artery was angiographically normal and gave rise to one major bifurcating obtuse marginal Shey Yott.   The RCA was angiographically normal it gave rise to a PDA and PLA vessel.   Left ventriculography revealed normal global LV contractility without focal segmental wall motion abnormalities. There was no evidence for mitral regurgitation. Ejection fraction was 60%.   Total contrast used: 85 cc Omnipaque  IMPRESSION:  Significant coronary obstructive disease with 70% distal left main stenosis.  Normal LV function.   RECOMMENDATION:  Surgical consultation for consideration for CABG.  10/2014 Echo Study Conclusions  - Left ventricle:  The cavity size was normal. Wall thickness was increased in a pattern of mild LVH. Systolic function was normal. The estimated ejection fraction was in the range of 60% to 65%. Diastolic function is abnormal, indeterminant grade. Wall motion was normal; there were no regional wall motion abnormalities. - Aortic valve: Moderately calcified annulus. Trileaflet; mildly thickened leaflets. There is aortic sclerosis without significant stenosis. There was trivial regurgitation. Valve area (VTI): 1.79 cm^2. Valve area (Vmax): 1.82 cm^2. - Mitral valve: Mildly calcified annulus. Mildly thickened leaflets . - Atrial septum: No defect or patent foramen ovale was identified. - Technically adequate study.  10/2014 Carotid US Summary:  - The vertebral arteries appear patent with antegrade flow. - Findings consistent with 1- 39 percent stenosis involving the right internal carotid artery and the left internal  carotid artery. - ICA/CCA ratio. right = 0.81. left =1.36. - Palpable pedal pulses.  05/2015 echo Study Conclusions  - Left ventricle: The cavity size was normal. Wall thickness was  increased in a pattern of moderate LVH. Systolic function was  normal. The estimated ejection fraction was in the range of 60%  to 65%. Wall motion was normal; there were no regional wall  motion abnormalities. Doppler parameters are consistent with  abnormal left ventricular relaxation (grade 1 diastolic  dysfunction). - Aortic valve: Trileaflet; moderately calcified leaflets. Left  coronary cusp mobility was restricted. There was mild stenosis.  Mean gradient (S): 16 mm Hg. Peak gradient (S): 27 mm Hg. VTI  ratio of LVOT to aortic valve: 0.48. Valve area (VTI): 1.5 cm^2. - Mitral valve: Calcified annulus. There was trivial regurgitation. - Right atrium: Central venous pressure (est): 3 mm Hg. - Atrial septum: No defect or patent foramen ovale was identified. - Tricuspid valve: There was trivial regurgitation. - Pulmonary arteries: Systolic pressure could not be accurately  estimated. - Pericardium, extracardiac: There was no pericardial effusion.  Impressions:  - Moderate LVH with LVEF 60-65% and grade 1 diastolic dysfunction.  Mild calcific aortic stenosis as outlined. Trivial tricuspid  regurgitation, unable to assess PASP.   03/2016 Carotid US Mild bilateral disease   Assessment and Plan   1. CAD - no current symptoms - she will continue current meds  2. HTN - bp at goal, she will continue current meds   3. HL  - continue current statin - obtain labs from pcp  4. Aortic stenosis - mild by last echo, no symptoms. Continue to monitor       Arnoldo Lenis, M.D.

## 2016-12-29 NOTE — Patient Instructions (Signed)

## 2017-01-04 ENCOUNTER — Ambulatory Visit (HOSPITAL_COMMUNITY)
Admission: RE | Admit: 2017-01-04 | Discharge: 2017-01-04 | Disposition: A | Discharge status: Home / Self Care | Payer: BLUE CROSS/BLUE SHIELD | Source: Ambulatory Visit | Attending: Registered Nurse | Admitting: Registered Nurse

## 2017-01-04 ENCOUNTER — Other Ambulatory Visit (HOSPITAL_COMMUNITY): Payer: Self-pay | Admitting: Registered Nurse

## 2017-01-04 DIAGNOSIS — R52 Pain, unspecified: Secondary | ICD-10-CM

## 2017-01-04 DIAGNOSIS — Z6841 Body Mass Index (BMI) 40.0 and over, adult: Secondary | ICD-10-CM | POA: Insufficient documentation

## 2017-01-04 DIAGNOSIS — M79644 Pain in right finger(s): Secondary | ICD-10-CM | POA: Insufficient documentation

## 2017-01-04 DIAGNOSIS — R609 Edema, unspecified: Secondary | ICD-10-CM | POA: Diagnosis present

## 2017-01-06 ENCOUNTER — Other Ambulatory Visit (HOSPITAL_COMMUNITY): Payer: Self-pay | Admitting: Registered Nurse

## 2017-01-06 DIAGNOSIS — Z1231 Encounter for screening mammogram for malignant neoplasm of breast: Secondary | ICD-10-CM

## 2017-01-13 ENCOUNTER — Ambulatory Visit (HOSPITAL_COMMUNITY)
Admission: RE | Admit: 2017-01-13 | Discharge: 2017-01-13 | Disposition: A | Discharge status: Home / Self Care | Payer: BLUE CROSS/BLUE SHIELD | Source: Ambulatory Visit | Attending: Registered Nurse | Admitting: Registered Nurse

## 2017-01-13 DIAGNOSIS — Z1231 Encounter for screening mammogram for malignant neoplasm of breast: Secondary | ICD-10-CM | POA: Diagnosis not present

## 2017-03-11 ENCOUNTER — Telehealth: Payer: Self-pay

## 2017-03-11 NOTE — Telephone Encounter (Signed)
PATIENT RECEIVED LETTER TO SCHEDULE TCS [phone]

## 2017-03-11 NOTE — Telephone Encounter (Signed)
LMOM to call.

## 2017-03-15 ENCOUNTER — Telehealth: Payer: Self-pay

## 2017-03-18 NOTE — Telephone Encounter (Signed)
See separate triage.  

## 2017-03-23 NOTE — Telephone Encounter (Signed)
Gastroenterology Pre-Procedure Review  Request Date: 03/15/2017 Requesting Physician: Dr. Gerarda Fraction  PATIENT REVIEW QUESTIONS: The patient responded to the following health history questions as indicated:    1. Diabetes Melitis: no 2. Joint replacements in the past 12 months: May 2017  Left hip replacement 3. Major health problems in the past 3 months: no 4. Has an artificial valve or MVP: no 5. Has a defibrillator: no 6. Has been advised in past to take antibiotics in advance of a procedure like teeth cleaning: YES? AFTER KNEE REPLACEMENT 7. Family history of colon cancer: no  8. Alcohol Use: no 9. History of sleep apnea: no  10. History of coronary artery or other vascular stents placed within the last 12 months: no    MEDICATIONS & ALLERGIES:    Patient reports the following regarding taking any blood thinners:   Plavix? no Aspirin? yes Coumadin? no Brilinta? no Xarelto? no Eliquis? no Pradaxa? no Savaysa? no Effient? no  Patient confirms/reports the following medications:  Current Outpatient Prescriptions  Medication Sig Dispense Refill  . ALPRAZolam (XANAX) 0.5 MG tablet Take 0.5 mg by mouth daily as needed. Panic attacks/anxiety.    Marland Kitchen aspirin 81 MG tablet Take 1 tablet (81 mg total) by mouth 2 (two) times daily after a meal. 60 tablet 1  . atorvastatin (LIPITOR) 80 MG tablet TAKE ONE TABLET BY MOUTH ONCE DAILY AT 6 P.M. 90 tablet 01  . furosemide (LASIX) 20 MG tablet Take 1 tablet (20 mg total) by mouth daily. 90 tablet 1  . losartan (COZAAR) 25 MG tablet Take 0.5 tablets (12.5 mg total) by mouth daily. 45 tablet 3  . metoprolol tartrate (LOPRESSOR) 25 MG tablet Take 0.5 tablets (12.5 mg total) by mouth 2 (two) times daily. 90 tablet 1  . docusate sodium (COLACE) 100 MG capsule Take 1 capsule (100 mg total) by mouth 2 (two) times daily. (Patient not taking: Reported on 03/15/2017) 60 capsule 3  . HYDROcodone-acetaminophen (NORCO) 5-325 MG tablet Take 1-2 tablets by mouth every  4 (four) hours as needed for moderate pain. (Patient not taking: Reported on 03/15/2017) 90 tablet 0   No current facility-administered medications for this visit.     Patient confirms/reports the following allergies:  No Known Allergies  No orders of the defined types were placed in this encounter.   AUTHORIZATION INFORMATION Primary Insurance:   ID #:  Group #:  Pre-Cert / Auth required:Pre-Cert / Auth #:   Secondary Insurance:   ID #:   Group #:  Pre-Cert / Auth required:  Pre-Cert / Auth #:   SCHEDULE INFORMATION: Procedure has been scheduled as follows:  Date:              Time:   Location:   This Gastroenterology Pre-Precedure Review Form is being routed to the following provider(s): R. Garfield Cornea, MD

## 2017-03-24 NOTE — Telephone Encounter (Signed)
Approved, Phenergan 12.5 mg IV on call.

## 2017-04-06 NOTE — Telephone Encounter (Signed)
LMOM to call for appt date and time.

## 2017-04-07 NOTE — Telephone Encounter (Signed)
Mailed letter to call for date and time for procedure.

## 2017-05-03 ENCOUNTER — Encounter (HOSPITAL_COMMUNITY): Payer: Self-pay | Admitting: Emergency Medicine

## 2017-05-03 ENCOUNTER — Emergency Department (HOSPITAL_COMMUNITY)
Admission: EM | Admit: 2017-05-03 | Discharge: 2017-05-03 | Disposition: A | Discharge status: Home / Self Care | Payer: Medicare HMO | Attending: Emergency Medicine | Admitting: Emergency Medicine

## 2017-05-03 DIAGNOSIS — I10 Essential (primary) hypertension: Secondary | ICD-10-CM | POA: Insufficient documentation

## 2017-05-03 DIAGNOSIS — E119 Type 2 diabetes mellitus without complications: Secondary | ICD-10-CM | POA: Diagnosis not present

## 2017-05-03 DIAGNOSIS — Z79899 Other long term (current) drug therapy: Secondary | ICD-10-CM | POA: Diagnosis not present

## 2017-05-03 DIAGNOSIS — Z7982 Long term (current) use of aspirin: Secondary | ICD-10-CM | POA: Insufficient documentation

## 2017-05-03 DIAGNOSIS — R42 Dizziness and giddiness: Secondary | ICD-10-CM | POA: Insufficient documentation

## 2017-05-03 DIAGNOSIS — I2510 Atherosclerotic heart disease of native coronary artery without angina pectoris: Secondary | ICD-10-CM | POA: Insufficient documentation

## 2017-05-03 DIAGNOSIS — R55 Syncope and collapse: Secondary | ICD-10-CM | POA: Diagnosis not present

## 2017-05-03 LAB — BASIC METABOLIC PANEL
Anion gap: 8 (ref 5–15)
BUN: 18 mg/dL (ref 6–20)
CO2: 22 mmol/L (ref 22–32)
Calcium: 8.7 mg/dL — ABNORMAL LOW (ref 8.9–10.3)
Chloride: 111 mmol/L (ref 101–111)
Creatinine, Ser: 0.98 mg/dL (ref 0.44–1.00)
Glucose, Bld: 157 mg/dL — ABNORMAL HIGH (ref 65–99)
POTASSIUM: 3.8 mmol/L (ref 3.5–5.1)
SODIUM: 141 mmol/L (ref 135–145)

## 2017-05-03 LAB — CBC WITH DIFFERENTIAL/PLATELET
BASOS ABS: 0 10*3/uL (ref 0.0–0.1)
BASOS PCT: 0 %
EOS ABS: 0.2 10*3/uL (ref 0.0–0.7)
EOS PCT: 2 %
HCT: 40.2 % (ref 36.0–46.0)
Hemoglobin: 12.5 g/dL (ref 12.0–15.0)
LYMPHS PCT: 29 %
Lymphs Abs: 2.7 10*3/uL (ref 0.7–4.0)
MCH: 25.6 pg — AB (ref 26.0–34.0)
MCHC: 31.1 g/dL (ref 30.0–36.0)
MCV: 82.2 fL (ref 78.0–100.0)
MONOS PCT: 7 %
Monocytes Absolute: 0.6 10*3/uL (ref 0.1–1.0)
NEUTROS ABS: 5.7 10*3/uL (ref 1.7–7.7)
NEUTROS PCT: 62 %
PLATELETS: 229 10*3/uL (ref 150–400)
RBC: 4.89 MIL/uL (ref 3.87–5.11)
RDW: 18 % — ABNORMAL HIGH (ref 11.5–15.5)
WBC: 9.2 10*3/uL (ref 4.0–10.5)

## 2017-05-03 NOTE — Discharge Instructions (Signed)
You were seen today for lightheadedness and near syncope. Your workup is reassuring. You are not anemic. You do not appear significantly dehydrated. Your EKG shows no evidence of arrhythmia. The cause of your symptoms is unknown at this time some: However, it could be related to just waking up and quickly sitting up. If you are having worsening symptoms you need to be reevaluated immediately. Follow-up with her primary physician in one to 2 days.

## 2017-05-03 NOTE — ED Provider Notes (Signed)
[facility] DEPT Provider Note   CSN: [id number] Arrival date & time: 05/03/17  0522     History   Chief Complaint Chief Complaint  Patient presents with  . Near Syncope    HPI Karina Green is a 61 y.o. female.  HPI  This is a 61 year old female with a history of coronary artery disease, diabetes, hypertension, hyperlipidemia who presents with dizziness. Patient reports that she woke up from sleep and got up to go the restroom. She states that she had acute onset of dizziness while standing. Denies room spinning dizziness. States that she felt more off balance and like "I might pass out." She denies any difficulty walking, weakness, numbness, tingling, speech difficulties. She denies any chest pain or shortness of breath during this time. Currently she states she feels much improved. She's never had anything like this before. Reports that she feels she has been hydrated over the last several days.  Past Medical History:  Diagnosis Date  . Arthritis   . Back pain   . CAD (coronary artery disease), native coronary artery-70% LM stenosis 10/25/2014  . Cough last 30 yrs    nonproductive, no fevers  . DM type 2 (diabetes mellitus, type 2) ([place]) 10/25/14  . Gout   . Hyperlipidemia   . Hypertension     Patient Active Problem List   Diagnosis Date Noted  . Primary osteoarthritis of left hip 05/07/2016  . Chronic diastolic heart failure ([place]) 06/20/2015  . Aortic valve stenosis, mild 06/20/2015  . CKD (chronic kidney disease), stage III 06/20/2015  . Hypokalemia 06/20/2015  . Sepsis ([place]) 06/20/2015  . Acute respiratory failure with hypoxia ([place]) 06/20/2015  . CAP (community acquired pneumonia) 06/17/2015  . CAD (coronary artery disease), native coronary artery-70% LM stenosis 10/26/2014  . DM2 (diabetes mellitus, type 2) ([place]) 10/26/2014  . Unstable angina ([place]) 10/25/2014  . Essential hypertension 10/24/2014  . Hypercholesteremia 10/24/2014    Past Surgical History:    Procedure Laterality Date  . ABDOMINAL HYSTERECTOMY     complete  . APPENDECTOMY    . bladder tack    . CARDIAC CATHETERIZATION  10/25/14   70% LM stensis, normal EF  . CHOLECYSTECTOMY    . CORONARY ARTERY BYPASS GRAFT N/A 10/29/2014   Procedure: CORONARY ARTERY BYPASS GRAFTING (CABG) TIMES TWO USING LEFT INTERNAL MAMMARY ARTERY AND RIGHT LEG GREATER SAPHENOUS VEIN HARVESTED ENDOSCOPICALLY.;  Surgeon: Ivin Poot, MD;  Location: [facility];  Service: Open Heart Surgery;  Laterality: N/A;  . HERNIA REPAIR    . INTRAOPERATIVE TRANSESOPHAGEAL ECHOCARDIOGRAM N/A 10/29/2014   Procedure: INTRAOPERATIVE TRANSESOPHAGEAL ECHOCARDIOGRAM;  Surgeon: Ivin Poot, MD;  Location: [facility];  Service: Open Heart Surgery;  Laterality: N/A;  . JOINT REPLACEMENT Right 2009   hip total  . LEFT HEART CATHETERIZATION WITH CORONARY ANGIOGRAM N/A 10/26/2014   Procedure: LEFT HEART CATHETERIZATION WITH CORONARY ANGIOGRAM;  Surgeon: Troy Sine, MD;  Location: [facility] CATH LAB;  Service: Cardiovascular;  Laterality: N/A;  . TONSILLECTOMY    . TOTAL HIP ARTHROPLASTY    . TOTAL HIP ARTHROPLASTY Left 05/07/2016   Procedure: TOTAL HIP ARTHROPLASTY ANTERIOR APPROACH;  Surgeon: Rod Can, MD;  Location: [facility];  Service: Orthopedics;  Laterality: Left;    OB History    No data available       Home Medications    Prior to Admission medications   Medication Sig Start Date End Date Taking? Authorizing Provider  ALPRAZolam Duanne Moron) 0.5 MG tablet Take 0.5 mg by mouth  daily as needed. Panic attacks/anxiety.    [provider]  aspirin 81 MG tablet Take 1 tablet (81 mg total) by mouth 2 (two) times daily after a meal. 05/08/16   Swinteck, Aaron Edelman, MD  atorvastatin (LIPITOR) 80 MG tablet TAKE ONE TABLET BY MOUTH ONCE DAILY AT 6 P.M. 03/27/16   Arnoldo Lenis, MD  docusate sodium (COLACE) 100 MG capsule Take 1 capsule (100 mg total) by mouth 2 (two) times daily. Patient not taking: Reported on 03/15/2017 05/08/16    Rod Can, MD  furosemide (LASIX) 20 MG tablet Take 1 tablet (20 mg total) by mouth daily. 03/27/16   Arnoldo Lenis, MD  HYDROcodone-acetaminophen (NORCO) 5-325 MG tablet Take 1-2 tablets by mouth every 4 (four) hours as needed for moderate pain. Patient not taking: Reported on 03/15/2017 05/08/16   Rod Can, MD  losartan (COZAAR) 25 MG tablet Take 0.5 tablets (12.5 mg total) by mouth daily. 03/27/16   Arnoldo Lenis, MD  metoprolol tartrate (LOPRESSOR) 25 MG tablet Take 0.5 tablets (12.5 mg total) by mouth 2 (two) times daily. 03/27/16   Arnoldo Lenis, MD    Family History Family History  Problem Relation Age of Onset  . Hypertension Mother   . Coronary artery disease Mother        CABG  . Hypertension Father   . CAD Father        CABG  . Coronary artery disease Brother        Stent    Social History Social History  Substance Use Topics  . Smoking status: Never Smoker  . Smokeless tobacco: Never Used  . Alcohol use No     Allergies   Patient has no known allergies.   Review of Systems Review of Systems  Constitutional: Negative for fever.  Respiratory: Negative for shortness of breath.   Cardiovascular: Negative for chest pain.  Gastrointestinal: Negative for abdominal pain, nausea and vomiting.  Neurological: Positive for dizziness and light-headedness. Negative for syncope, speech difficulty, weakness and numbness.  All other systems reviewed and are negative.    Physical Exam Updated Vital Signs BP (!) 146/64   Pulse (!) 59   Temp 97.8 F (36.6 C)   Resp 16   Ht 5\' 3"  (1.6 m)   Wt 235 lb (106.6 kg)   SpO2 98%   BMI 41.63 kg/m   Physical Exam  Constitutional: She is oriented to person, place, and time. She appears well-developed and well-nourished. No distress.  Overweight  HENT:  Head: Normocephalic and atraumatic.  Cardiovascular: Normal rate, regular rhythm and normal heart sounds.   No murmur heard. Pulmonary/Chest: Effort  normal and breath sounds normal. No respiratory distress. She has no wheezes.  Abdominal: Soft. Bowel sounds are normal. There is no tenderness.  Neurological: She is alert and oriented to person, place, and time.  Cranial nerves II through XII intact, 5 out of 5 strength in all 4 extremities, no dysmetria to finger-nose-finger, normal gait  Skin: Skin is warm and dry.  Psychiatric: She has a normal mood and affect.  Nursing note and vitals reviewed.    ED Treatments / Results  Labs (all labs ordered are listed, but only abnormal results are displayed) Labs Reviewed  CBC WITH DIFFERENTIAL/PLATELET - Abnormal; Notable for the following:       Result Value   MCH 25.6 (*)    RDW 18.0 (*)    All other components within normal limits  BASIC METABOLIC PANEL - Abnormal; Notable  for the following:    Glucose, Bld 157 (*)    Calcium 8.7 (*)    All other components within normal limits    EKG  EKG Interpretation  Date/Time:  Monday May 03 2017 05:48:43 EDT Ventricular Rate:  66 PR Interval:    QRS Duration: 102 QT Interval:  429 QTC Calculation: 450 R Axis:   30 Text Interpretation:  Sinus rhythm Low voltage, precordial leads Abnrm T, consider ischemia, anterolateral lds Confirmed by Thayer Jew [phone]) on 05/03/2017 6:12:53 AM       Radiology No results found.  Procedures Procedures (including critical care time)  Medications Ordered in ED Medications - No data to display   Initial Impression / Assessment and Plan / ED Course  I have reviewed the triage vital signs and the nursing notes.  Pertinent labs & imaging results that were available during my care of the patient were reviewed by me and considered in my medical decision making (see chart for details).     Patient presents with lightheadedness and near syncope upon wakening this morning. Currently she feels much better. Denies any associated symptoms. Denies vertigo or room spinning dizziness. She is  neurologically intact. Doubt central etiology. Vital signs reassuring. Orthostatics are negative. EKG is without arrhythmia. Basic labwork obtained. No evidence of anemia. Sugar is 157. She is able to ambulate without difficulty and reports being at her baseline. Suspect she may have gotten up too quickly from sleeping causing her symptoms. If she has recurrence or worsening of symptoms she is encouraged to follow-up closely with her primary physician or return here for any new or worsening symptoms.  Final Clinical Impressions(s) / ED Diagnoses   Final diagnoses:  Near syncope    New Prescriptions New Prescriptions   No medications on file     Merryl Hacker, MD 05/03/17 [phone]

## 2017-05-03 NOTE — ED Notes (Signed)
Pt wheeled to car. Pt verbalized understanding of discharge instructions.  

## 2017-05-03 NOTE — ED Notes (Signed)
Pt walked around nurses station with NAD. Pt stated she "felt a little dizzy but nothing like before."

## 2017-05-03 NOTE — ED Triage Notes (Signed)
Pt C/O of dizziness upon waking this morning. Pt states when she stood up from her recliner she had to sit back down because she "felt like she was going to pass out"

## 2017-05-05 DIAGNOSIS — Z1389 Encounter for screening for other disorder: Secondary | ICD-10-CM | POA: Diagnosis not present

## 2017-05-05 DIAGNOSIS — E782 Mixed hyperlipidemia: Secondary | ICD-10-CM | POA: Diagnosis not present

## 2017-05-05 DIAGNOSIS — Z1211 Encounter for screening for malignant neoplasm of colon: Secondary | ICD-10-CM | POA: Diagnosis not present

## 2017-05-05 DIAGNOSIS — I10 Essential (primary) hypertension: Secondary | ICD-10-CM | POA: Diagnosis not present

## 2017-05-05 DIAGNOSIS — E119 Type 2 diabetes mellitus without complications: Secondary | ICD-10-CM | POA: Diagnosis not present

## 2017-05-05 DIAGNOSIS — Z6841 Body Mass Index (BMI) 40.0 and over, adult: Secondary | ICD-10-CM | POA: Diagnosis not present

## 2017-05-12 DIAGNOSIS — J209 Acute bronchitis, unspecified: Secondary | ICD-10-CM | POA: Diagnosis not present

## 2017-05-12 DIAGNOSIS — J029 Acute pharyngitis, unspecified: Secondary | ICD-10-CM | POA: Diagnosis not present

## 2017-05-12 DIAGNOSIS — J019 Acute sinusitis, unspecified: Secondary | ICD-10-CM | POA: Diagnosis not present

## 2017-06-02 DIAGNOSIS — L409 Psoriasis, unspecified: Secondary | ICD-10-CM | POA: Diagnosis not present

## 2017-06-07 DIAGNOSIS — H5200 Hypermetropia, unspecified eye: Secondary | ICD-10-CM | POA: Diagnosis not present

## 2017-06-07 DIAGNOSIS — I10 Essential (primary) hypertension: Secondary | ICD-10-CM | POA: Diagnosis not present

## 2017-06-07 DIAGNOSIS — E119 Type 2 diabetes mellitus without complications: Secondary | ICD-10-CM | POA: Diagnosis not present

## 2017-08-30 DIAGNOSIS — Z0000 Encounter for general adult medical examination without abnormal findings: Secondary | ICD-10-CM | POA: Diagnosis not present

## 2017-08-30 DIAGNOSIS — Z6841 Body Mass Index (BMI) 40.0 and over, adult: Secondary | ICD-10-CM | POA: Diagnosis not present

## 2017-08-30 DIAGNOSIS — I10 Essential (primary) hypertension: Secondary | ICD-10-CM | POA: Diagnosis not present

## 2017-08-30 DIAGNOSIS — E782 Mixed hyperlipidemia: Secondary | ICD-10-CM | POA: Diagnosis not present

## 2017-08-30 DIAGNOSIS — Z1389 Encounter for screening for other disorder: Secondary | ICD-10-CM | POA: Diagnosis not present

## 2017-08-30 DIAGNOSIS — E119 Type 2 diabetes mellitus without complications: Secondary | ICD-10-CM | POA: Diagnosis not present

## 2017-09-09 DIAGNOSIS — L409 Psoriasis, unspecified: Secondary | ICD-10-CM | POA: Diagnosis not present

## 2017-09-09 DIAGNOSIS — Z23 Encounter for immunization: Secondary | ICD-10-CM | POA: Diagnosis not present

## 2017-09-16 DIAGNOSIS — M1000 Idiopathic gout, unspecified site: Secondary | ICD-10-CM | POA: Diagnosis not present

## 2017-09-16 DIAGNOSIS — Z6841 Body Mass Index (BMI) 40.0 and over, adult: Secondary | ICD-10-CM | POA: Diagnosis not present

## 2017-10-07 DIAGNOSIS — M25561 Pain in right knee: Secondary | ICD-10-CM | POA: Diagnosis not present

## 2017-10-07 DIAGNOSIS — M1000 Idiopathic gout, unspecified site: Secondary | ICD-10-CM | POA: Diagnosis not present

## 2017-10-07 DIAGNOSIS — Z6841 Body Mass Index (BMI) 40.0 and over, adult: Secondary | ICD-10-CM | POA: Diagnosis not present

## 2017-10-20 ENCOUNTER — Observation Stay (HOSPITAL_BASED_OUTPATIENT_CLINIC_OR_DEPARTMENT_OTHER): Payer: Medicare HMO

## 2017-10-20 ENCOUNTER — Observation Stay (HOSPITAL_COMMUNITY)
Admission: EM | Admit: 2017-10-20 | Discharge: 2017-10-21 | Disposition: A | Discharge status: Home / Self Care | Payer: Medicare HMO | Attending: Internal Medicine | Admitting: Internal Medicine

## 2017-10-20 ENCOUNTER — Emergency Department (HOSPITAL_COMMUNITY): Payer: Medicare HMO

## 2017-10-20 ENCOUNTER — Encounter (HOSPITAL_COMMUNITY): Payer: Self-pay | Admitting: *Deleted

## 2017-10-20 DIAGNOSIS — I130 Hypertensive heart and chronic kidney disease with heart failure and stage 1 through stage 4 chronic kidney disease, or unspecified chronic kidney disease: Secondary | ICD-10-CM | POA: Insufficient documentation

## 2017-10-20 DIAGNOSIS — Z23 Encounter for immunization: Secondary | ICD-10-CM | POA: Insufficient documentation

## 2017-10-20 DIAGNOSIS — N183 Chronic kidney disease, stage 3 (moderate): Secondary | ICD-10-CM | POA: Insufficient documentation

## 2017-10-20 DIAGNOSIS — E119 Type 2 diabetes mellitus without complications: Secondary | ICD-10-CM

## 2017-10-20 DIAGNOSIS — D72829 Elevated white blood cell count, unspecified: Secondary | ICD-10-CM | POA: Diagnosis present

## 2017-10-20 DIAGNOSIS — E1122 Type 2 diabetes mellitus with diabetic chronic kidney disease: Secondary | ICD-10-CM | POA: Insufficient documentation

## 2017-10-20 DIAGNOSIS — R079 Chest pain, unspecified: Secondary | ICD-10-CM

## 2017-10-20 DIAGNOSIS — Z79899 Other long term (current) drug therapy: Secondary | ICD-10-CM | POA: Insufficient documentation

## 2017-10-20 DIAGNOSIS — I2510 Atherosclerotic heart disease of native coronary artery without angina pectoris: Secondary | ICD-10-CM | POA: Insufficient documentation

## 2017-10-20 DIAGNOSIS — E7800 Pure hypercholesterolemia, unspecified: Secondary | ICD-10-CM | POA: Diagnosis present

## 2017-10-20 DIAGNOSIS — R9431 Abnormal electrocardiogram [ECG] [EKG]: Secondary | ICD-10-CM | POA: Diagnosis not present

## 2017-10-20 DIAGNOSIS — I10 Essential (primary) hypertension: Secondary | ICD-10-CM | POA: Diagnosis present

## 2017-10-20 DIAGNOSIS — Z7982 Long term (current) use of aspirin: Secondary | ICD-10-CM | POA: Diagnosis not present

## 2017-10-20 DIAGNOSIS — E876 Hypokalemia: Secondary | ICD-10-CM | POA: Diagnosis present

## 2017-10-20 DIAGNOSIS — I5032 Chronic diastolic (congestive) heart failure: Secondary | ICD-10-CM | POA: Diagnosis not present

## 2017-10-20 DIAGNOSIS — Z7984 Long term (current) use of oral hypoglycemic drugs: Secondary | ICD-10-CM | POA: Diagnosis not present

## 2017-10-20 LAB — HEPATIC FUNCTION PANEL
ALK PHOS: 84 U/L (ref 38–126)
ALT: 43 U/L (ref 14–54)
AST: 23 U/L (ref 15–41)
Albumin: 3.8 g/dL (ref 3.5–5.0)
BILIRUBIN TOTAL: 0.7 mg/dL (ref 0.3–1.2)
Bilirubin, Direct: 0.1 mg/dL (ref 0.1–0.5)
Indirect Bilirubin: 0.6 mg/dL (ref 0.3–0.9)
TOTAL PROTEIN: 7.2 g/dL (ref 6.5–8.1)

## 2017-10-20 LAB — CBC WITH DIFFERENTIAL/PLATELET
Basophils Absolute: 0 10*3/uL (ref 0.0–0.1)
Basophils Relative: 0 %
EOS PCT: 1 %
Eosinophils Absolute: 0.2 10*3/uL (ref 0.0–0.7)
HCT: 40.3 % (ref 36.0–46.0)
Hemoglobin: 12.9 g/dL (ref 12.0–15.0)
LYMPHS ABS: 3.2 10*3/uL (ref 0.7–4.0)
LYMPHS PCT: 25 %
MCH: 25.9 pg — AB (ref 26.0–34.0)
MCHC: 32 g/dL (ref 30.0–36.0)
MCV: 80.8 fL (ref 78.0–100.0)
MONO ABS: 1.1 10*3/uL — AB (ref 0.1–1.0)
Monocytes Relative: 8 %
Neutro Abs: 8.6 10*3/uL — ABNORMAL HIGH (ref 1.7–7.7)
Neutrophils Relative %: 66 %
PLATELETS: 246 10*3/uL (ref 150–400)
RBC: 4.99 MIL/uL (ref 3.87–5.11)
RDW: 17.7 % — AB (ref 11.5–15.5)
WBC: 13.1 10*3/uL — ABNORMAL HIGH (ref 4.0–10.5)

## 2017-10-20 LAB — ECHOCARDIOGRAM COMPLETE
AO mean calculated velocity dopler: 189 cm/s
AOPV: 0.44 m/s
AOVTI: 66.7 cm
AV Area VTI index: 0.84 cm2/m2
AV Area VTI: 1.81 cm2
AV Mean grad: 16 mmHg
AV Peak grad: 28 mmHg
AV pk vel: 266 cm/s
AVAREAMEANV: 1.66 cm2
AVAREAMEANVIN: 0.77 cm2/m2
AVCELMEANRAT: 0.4
CHL CUP AV PEAK INDEX: 0.84
CHL CUP AV VALUE AREA INDEX: 0.84
CHL CUP AV VEL: 1.8
CHL CUP DOP CALC LVOT VTI: 28.9 cm
CHL CUP RV SYS PRESS: 27 mmHg
CHL CUP TV REG PEAK VELOCITY: 246 cm/s
E decel time: 331 msec
EERAT: 13.29
FS: 40 % (ref 28–44)
HEIGHTINCHES: 63 in
IVS/LV PW RATIO, ED: 1.06
LA vol A4C: 56.1 ml
LA vol index: 21.2 mL/m2
LADIAMINDEX: 1.72 cm/m2
LASIZE: 37 mm
LAVOL: 45.7 mL
LEFT ATRIUM END SYS DIAM: 37 mm
LV E/e' medial: 13.29
LV E/e'average: 13.29
LV PW d: 10.9 mm — AB (ref 0.6–1.1)
LV dias vol index: 27 mL/m2
LV dias vol: 59 mL (ref 46–106)
LV e' LATERAL: 7.51 cm/s
LVOT SV: 120 mL
LVOT area: 4.15 cm2
LVOT diameter: 23 mm
LVOT peak grad rest: 5 mmHg
LVOTPV: 116 cm/s
LVOTVTI: 0.43 cm
LVSYSVOL: 24 mL
LVSYSVOLIN: 11 mL/m2
MV Dec: 331
MV Peak grad: 4 mmHg
MVPKAVEL: 113 m/s
MVPKEVEL: 99.8 m/s
RV LATERAL S' VELOCITY: 10.1 cm/s
Simpson's disk: 60
Stroke v: 36 ml
TAPSE: 16.6 mm
TDI e' lateral: 7.51
TDI e' medial: 4.9
TRMAXVEL: 246 cm/s
Valve area: 1.8 cm2
Weight: 3510.4 oz

## 2017-10-20 LAB — BASIC METABOLIC PANEL
ANION GAP: 10 (ref 5–15)
Anion gap: 12 (ref 5–15)
BUN: 29 mg/dL — ABNORMAL HIGH (ref 6–20)
BUN: 24 mg/dL — ABNORMAL HIGH (ref 6–20)
CALCIUM: 8.7 mg/dL — AB (ref 8.9–10.3)
CHLORIDE: 102 mmol/L (ref 101–111)
CO2: 23 mmol/L (ref 22–32)
CO2: 23 mmol/L (ref 22–32)
Calcium: 9 mg/dL (ref 8.9–10.3)
Chloride: 108 mmol/L (ref 101–111)
Creatinine, Ser: 1.13 mg/dL — ABNORMAL HIGH (ref 0.44–1.00)
Creatinine, Ser: 0.95 mg/dL (ref 0.44–1.00)
GFR calc non Af Amer: 51 mL/min — ABNORMAL LOW (ref >60)
GFR, EST AFRICAN AMERICAN: 59 mL/min — AB (ref >60)
Glucose, Bld: 134 mg/dL — ABNORMAL HIGH (ref 65–99)
Glucose, Bld: 105 mg/dL — ABNORMAL HIGH (ref 65–99)
POTASSIUM: 2.9 mmol/L — AB (ref 3.5–5.1)
Potassium: 3.5 mmol/L (ref 3.5–5.1)
Sodium: 137 mmol/L (ref 135–145)
Sodium: 141 mmol/L (ref 135–145)

## 2017-10-20 LAB — CBC
HEMATOCRIT: 41.4 % (ref 36.0–46.0)
Hemoglobin: 13.6 g/dL (ref 12.0–15.0)
MCH: 26.2 pg (ref 26.0–34.0)
MCHC: 32.9 g/dL (ref 30.0–36.0)
MCV: 79.8 fL (ref 78.0–100.0)
Platelets: 255 10*3/uL (ref 150–400)
RBC: 5.19 MIL/uL — AB (ref 3.87–5.11)
RDW: 17.3 % — ABNORMAL HIGH (ref 11.5–15.5)
WBC: 19.3 10*3/uL — ABNORMAL HIGH (ref 4.0–10.5)

## 2017-10-20 LAB — URINALYSIS, ROUTINE W REFLEX MICROSCOPIC
BILIRUBIN URINE: NEGATIVE
Glucose, UA: NEGATIVE mg/dL
Hgb urine dipstick: NEGATIVE
KETONES UR: NEGATIVE mg/dL
LEUKOCYTES UA: NEGATIVE
NITRITE: NEGATIVE
PH: 6 (ref 5.0–8.0)
Protein, ur: NEGATIVE mg/dL
SPECIFIC GRAVITY, URINE: 1.012 (ref 1.005–1.030)

## 2017-10-20 LAB — TROPONIN I
Troponin I: <0.03 ng/mL (ref <0.03)
Troponin I: 0.03 ng/mL (ref <0.03)
Troponin I: <0.03 ng/mL (ref <0.03)

## 2017-10-20 LAB — I-STAT TROPONIN, ED: Troponin i, poc: 0.01 ng/mL (ref 0.00–0.08)

## 2017-10-20 LAB — GLUCOSE, CAPILLARY
Glucose-Capillary: 108 mg/dL — ABNORMAL HIGH (ref 65–99)
Glucose-Capillary: 109 mg/dL — ABNORMAL HIGH (ref 65–99)
Glucose-Capillary: 98 mg/dL (ref 65–99)

## 2017-10-20 LAB — MAGNESIUM: MAGNESIUM: 1.9 mg/dL (ref 1.7–2.4)

## 2017-10-20 MED ORDER — ONDANSETRON HCL 4 MG PO TABS: 4.0000 mg | ORAL_TABLET | Freq: Four times a day (QID) | ORAL | Status: DC | PRN

## 2017-10-20 MED ORDER — POTASSIUM CHLORIDE CRYS ER 20 MEQ PO TBCR
40.0000 meq | EXTENDED_RELEASE_TABLET | Freq: Once | ORAL | Status: AC
  Administered 2017-10-20: 40 meq via ORAL
  Filled 2017-10-20: qty 2

## 2017-10-20 MED ORDER — POTASSIUM CHLORIDE CRYS ER 20 MEQ PO TBCR
40.0000 meq | EXTENDED_RELEASE_TABLET | Freq: Two times a day (BID) | ORAL | Status: DC
  Administered 2017-10-20 – 2017-10-21 (×3): 40 meq via ORAL
  Filled 2017-10-20 (×3): qty 2

## 2017-10-20 MED ORDER — FUROSEMIDE 40 MG PO TABS
20.0000 mg | ORAL_TABLET | Freq: Every day | ORAL | Status: DC
  Administered 2017-10-20 – 2017-10-21 (×2): 20 mg via ORAL
  Filled 2017-10-20 (×2): qty 1

## 2017-10-20 MED ORDER — ENOXAPARIN SODIUM 40 MG/0.4ML ~~LOC~~ SOLN: 40.0000 mg | SUBCUTANEOUS | Status: DC

## 2017-10-20 MED ORDER — LOSARTAN POTASSIUM 25 MG PO TABS
12.5000 mg | ORAL_TABLET | Freq: Every day | ORAL | Status: DC
  Administered 2017-10-20 – 2017-10-21 (×2): 12.5 mg via ORAL
  Filled 2017-10-20 (×2): qty 1

## 2017-10-20 MED ORDER — ASPIRIN EC 81 MG PO TBEC
81.0000 mg | DELAYED_RELEASE_TABLET | Freq: Two times a day (BID) | ORAL | Status: DC
  Administered 2017-10-20 – 2017-10-21 (×3): 81 mg via ORAL
  Filled 2017-10-20 (×3): qty 1

## 2017-10-20 MED ORDER — ATORVASTATIN CALCIUM 40 MG PO TABS
80.0000 mg | ORAL_TABLET | Freq: Every day | ORAL | Status: DC
  Administered 2017-10-20: 80 mg via ORAL
  Filled 2017-10-20 (×2): qty 1
  Filled 2017-10-20: qty 2

## 2017-10-20 MED ORDER — INFLUENZA VAC SPLIT QUAD 0.5 ML IM SUSY
0.5000 mL | PREFILLED_SYRINGE | INTRAMUSCULAR | Status: AC
Start: 2017-10-21 — End: 2017-10-21
  Administered 2017-10-21: 0.5 mL via INTRAMUSCULAR
  Filled 2017-10-20: qty 0.5

## 2017-10-20 MED ORDER — NITROGLYCERIN 2 % TD OINT
1.0000 [in_us] | TOPICAL_OINTMENT | Freq: Once | TRANSDERMAL | Status: AC
  Administered 2017-10-20: 1 [in_us] via TOPICAL
  Filled 2017-10-20: qty 1

## 2017-10-20 MED ORDER — ACETAMINOPHEN 325 MG PO TABS
650.0000 mg | ORAL_TABLET | Freq: Four times a day (QID) | ORAL | Status: DC | PRN
  Administered 2017-10-21: 650 mg via ORAL
  Filled 2017-10-20: qty 2

## 2017-10-20 MED ORDER — ASPIRIN 325 MG PO TABS
325.0000 mg | ORAL_TABLET | Freq: Once | ORAL | Status: AC
  Administered 2017-10-20: 325 mg via ORAL
  Filled 2017-10-20: qty 1

## 2017-10-20 MED ORDER — GLIMEPIRIDE 2 MG PO TABS
1.0000 mg | ORAL_TABLET | Freq: Every day | ORAL | Status: DC
  Administered 2017-10-21: 1 mg via ORAL
  Filled 2017-10-20: qty 1

## 2017-10-20 MED ORDER — INSULIN ASPART 100 UNIT/ML ~~LOC~~ SOLN
0.0000 [IU] | Freq: Three times a day (TID) | SUBCUTANEOUS | Status: DC
  Administered 2017-10-21 (×2): 1 [IU] via SUBCUTANEOUS

## 2017-10-20 MED ORDER — ONDANSETRON HCL 4 MG/2ML IJ SOLN: 4.0000 mg | Freq: Four times a day (QID) | INTRAMUSCULAR | Status: DC | PRN

## 2017-10-20 MED ORDER — METOPROLOL TARTRATE 25 MG PO TABS
12.5000 mg | ORAL_TABLET | Freq: Two times a day (BID) | ORAL | Status: DC
  Administered 2017-10-20 – 2017-10-21 (×3): 12.5 mg via ORAL
  Filled 2017-10-20 (×3): qty 1

## 2017-10-20 MED ORDER — GLIMEPIRIDE 1 MG PO TABS
1.0000 mg | ORAL_TABLET | Freq: Every day | ORAL | Status: DC
  Administered 2017-10-20: 1 mg via ORAL
  Filled 2017-10-20 (×3): qty 1

## 2017-10-20 MED ORDER — ENOXAPARIN SODIUM 40 MG/0.4ML ~~LOC~~ SOLN
40.0000 mg | SUBCUTANEOUS | Status: DC
  Administered 2017-10-20 – 2017-10-21 (×2): 40 mg via SUBCUTANEOUS
  Filled 2017-10-20 (×2): qty 0.4

## 2017-10-20 NOTE — Consult Note (Signed)
Cardiology Consultation:   Patient ID: MADDALENA LINAREZ; [id number]; 07/25/1956   Admit date: 10/20/2017 Date of Consult: 10/20/2017  Primary Care Provider: [facility] Primary Cardiologist: Dr. Harl Bowie Primary Electrophysiologist: N/A  Patient Profile:   JACK MINEAU is a 61 y.o. female with a hx of CAD status post CABG who is being seen today for the evaluation of chest pain at the request of Dr. Roderic Palau.  History of Present Illness:   Ms. Beery is a 61 year old patient of Dr. Harl Bowie who had CABG x2 10/2014 for left main disease.  Normal LVEF on echo 2016 with mild aortic stenosis mean gradient 16 AVA 1.5, hypertension, HLD, DM type II.  Patient presented to the emergency room last night with chest pain while lying down radiating her back and both shoulders associated with nausea.  EKG ST elevation inferiorly and deep T wave inversion anterior lateral unchanged from previous tracings and troponins negative x2.  WBC was 19.3 potassium 2.9.  Patient states she was laying down at 10pm and had sudden dull ache across her entire chest and shoulders. Lasted about 3 hrs and relieved with 1 NTG in ER. Hasn't had since CABG. Didn't eat dinner last night but drank 4-5 cups of coffee b/c she was cold and that's unusual. Over the past couple of weeks she's noticed her heart racing and when she sits down it normalizes. 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 She doesn't exercise regularly but does her own house work without chest pain. Just finished steroids for gout.  Past Medical History:  Diagnosis Date  . Arthritis   . Back pain   . CAD (coronary artery disease), native coronary artery-70% LM stenosis 10/25/2014  . Cough last 30 yrs    nonproductive, no fevers  . DM type 2 (diabetes mellitus, type 2) ([place]) 10/25/14  . Gout   . Hyperlipidemia   . Hypertension     Past Surgical History:  Procedure Laterality Date  . ABDOMINAL HYSTERECTOMY      complete  . APPENDECTOMY    . bladder tack    . CARDIAC CATHETERIZATION  10/25/14   70% LM stensis, normal EF  . CHOLECYSTECTOMY    . CORONARY ARTERY BYPASS GRAFT N/A 10/29/2014   Procedure: CORONARY ARTERY BYPASS GRAFTING (CABG) TIMES TWO USING LEFT INTERNAL MAMMARY ARTERY AND RIGHT LEG GREATER SAPHENOUS VEIN HARVESTED ENDOSCOPICALLY.;  Surgeon: Ivin Poot, MD;  Location: [facility];  Service: Open Heart Surgery;  Laterality: N/A;  . HERNIA REPAIR    . INTRAOPERATIVE TRANSESOPHAGEAL ECHOCARDIOGRAM N/A 10/29/2014   Procedure: INTRAOPERATIVE TRANSESOPHAGEAL ECHOCARDIOGRAM;  Surgeon: Ivin Poot, MD;  Location: [facility];  Service: Open Heart Surgery;  Laterality: N/A;  . JOINT REPLACEMENT Right 2009   hip total  . LEFT HEART CATHETERIZATION WITH CORONARY ANGIOGRAM N/A 10/26/2014   Procedure: LEFT HEART CATHETERIZATION WITH CORONARY ANGIOGRAM;  Surgeon: Troy Sine, MD;  Location[facility] CATH LAB;  Service: Cardiovascular;  Laterality: N/A;  . TONSILLECTOMY    . TOTAL HIP ARTHROPLASTY    . TOTAL HIP ARTHROPLASTY Left 05/07/2016   Procedure: TOTAL HIP ARTHROPLASTY ANTERIOR APPROACH;  Surgeon: Rod Can, MD;  [facility];  Service: Orthopedics;  Laterality: Left;     Home Medications:  Prior to Admission medications   Medication Sig Start Date End Date Taking? Authorizing Provider  aspirin 81 MG tablet Take 1 tablet (81 mg total) by mouth 2 (two) times daily after a meal.  05/08/16  Yes Swinteck, Aaron Edelman, MD  atorvastatin (LIPITOR) 80 MG tablet TAKE ONE TABLET BY MOUTH ONCE DAILY AT 6 P.M. 03/27/16  Yes Branch, Alphonse Guild, MD  furosemide (LASIX) 20 MG tablet Take 1 tablet (20 mg total) by mouth daily. 03/27/16  Yes Branch, Alphonse Guild, MD  glimepiride (AMARYL) 1 MG tablet Take 1 mg by mouth daily with breakfast.   Yes [provider]  losartan (COZAAR) 25 MG tablet Take 0.5 tablets (12.5 mg total) by mouth daily. 03/27/16  Yes Branch, Alphonse Guild, MD  metoprolol tartrate (LOPRESSOR) 25  MG tablet Take 0.5 tablets (12.5 mg total) by mouth 2 (two) times daily. 03/27/16  Yes Branch, Alphonse Guild, MD  ALPRAZolam Duanne Moron) 0.5 MG tablet Take 0.5 mg by mouth daily as needed. Panic attacks/anxiety.    [provider]  docusate sodium (COLACE) 100 MG capsule Take 1 capsule (100 mg total) by mouth 2 (two) times daily. Patient not taking: Reported on 03/15/2017 05/08/16   Rod Can, MD  HYDROcodone-acetaminophen (NORCO) 5-325 MG tablet Take 1-2 tablets by mouth every 4 (four) hours as needed for moderate pain. Patient not taking: Reported on 03/15/2017 05/08/16   Rod Can, MD    Inpatient Medications: Scheduled Meds: . aspirin EC  81 mg Oral BID PC  . atorvastatin  80 mg Oral q1800  . enoxaparin (LOVENOX) injection  40 mg Subcutaneous Q24H  . furosemide  20 mg Oral Daily  . glimepiride  1 mg Oral Q breakfast  . [START ON 10/21/2017] Influenza vac split quadrivalent PF  0.5 mL Intramuscular Tomorrow-1000  . insulin aspart  0-9 Units Subcutaneous TID WC  . losartan  12.5 mg Oral Daily  . metoprolol tartrate  12.5 mg Oral BID   Continuous Infusions:  PRN Meds: acetaminophen, ondansetron **OR** ondansetron (ZOFRAN) IV, ondansetron (ZOFRAN) IV  Allergies:   No Known Allergies  Social History:   Social History   Social History  . Marital status: Divorced    Spouse name: N/A  . Number of children: N/A  . Years of education: N/A   Occupational History  . Not on file.   Social History Main Topics  . Smoking status: Never Smoker  . Smokeless tobacco: Never Used  . Alcohol use No  . Drug use: No  . Sexual activity: Not on file   Other Topics Concern  . Not on file   Social History Narrative  . No narrative on file    Family History:    Family History  Problem Relation Age of Onset  . Hypertension Mother   . Coronary artery disease Mother        CABG  . Hypertension Father   . CAD Father        CABG  . Coronary artery disease Brother        Stent      ROS:  Please see the history of present illness.  Review of Systems  Constitution: Negative.  HENT: Negative.   Eyes: Negative.   Cardiovascular: Positive for chest pain and palpitations.  Respiratory: Negative.   Hematologic/Lymphatic: Negative.   Musculoskeletal: Positive for gout. Negative for joint pain.  Gastrointestinal: Negative.   Genitourinary: Negative.   Neurological: Negative.      All other ROS reviewed and negative.     Physical Exam/Data:   Vitals:   10/20/17 0430 10/20/17 0500 10/20/17 0530 10/20/17 0632  BP: (!) 137/55 (!) 119/99 135/63 138/66  Pulse: 63 70 60 65  Resp: 19 16 13  16  Temp:    98.2 F (36.8 C)  TempSrc:    Oral  SpO2: 96% 93% 94% 100%  Weight:    219 lb 6.4 oz (99.5 kg)  Height:    5\' 3"  (1.6 m)   No intake or output data in the 24 hours ending 10/20/17 0753 Filed Weights   10/20/17 0123 10/20/17 0632  Weight: 224 lb (101.6 kg) 219 lb 6.4 oz (99.5 kg)   Body mass index is 38.86 kg/m.  General:  Obese in no acute distress  HEENT: normal Lymph: no adenopathy Neck: no JVD Endocrine:  No thryomegaly Vascular: bilateral carotid bruits; FA pulses 2+ bilaterally without bruits  Cardiac:  normal S1, S2; RRR;3/6 harsh sys murmur LSB Lungs:  clear to auscultation bilaterally, no wheezing, rhonchi or rales  Abd: soft, nontender, no hepatomegaly  Ext: no edema Musculoskeletal:  No deformities, BUE and BLE strength normal and equal Skin: warm and dry  Neuro:  CNs 2-12 intact, no focal abnormalities noted Psych:  Normal affect   EKG:  The EKG was personally reviewed and demonstrates:   Normal sinus rhythm with ST elevation inferiorly and deep T wave inversion anterior lateral, unchanged from prior tracings Telemetry:  Telemetry was personally reviewed and demonstrates:  NSR without arrhythmia   Relevant CV Studies: 10/2014 Cath HEMODYNAMICS:    Central Aorta: 140/66   Left Ventricle: 140/18   ANGIOGRAPHY:    The left main  coronary artery was a large-caliber vessel which bifurcated into the LAD and left circumflex coronary artery.  There was a 70% distal left main stenosis prior to its bifurcation with a suggestion of a focal napkin ring like appearance. This did not improve following IC nitroglycerin administration.   The LAD was angiographically normal and gave rise to 2 major diagonal vessels and several septal perforating arteries. The vessel extended to the LV apex.    The left circumflex coronary artery was angiographically normal and gave rise to one major bifurcating obtuse marginal branch.    The RCA was angiographically normal it gave rise to a  PDA and PLA vessel.    Left ventriculography revealed normal global LV contractility without focal segmental wall motion abnormalities. There was no evidence for mitral regurgitation. Ejection fraction was 60%.     Total contrast used: 85 cc Omnipaque   IMPRESSION:   Significant coronary obstructive disease with 70% distal left main stenosis.   Normal LV function.     RECOMMENDATION:   Surgical consultation for consideration for CABG.      05/2015 echo Study Conclusions   - Left ventricle: The cavity size was normal. Wall thickness was   increased in a pattern of moderate LVH. Systolic function was   normal. The estimated ejection fraction was in the range of 60%   to 65%. Wall motion was normal; there were no regional wall   motion abnormalities. Doppler parameters are consistent with   abnormal left ventricular relaxation (grade 1 diastolic   dysfunction). - Aortic valve: Trileaflet; moderately calcified leaflets. Left   coronary cusp mobility was restricted. There was mild stenosis.   Mean gradient (S): 16 mm Hg. Peak gradient (S): 27 mm Hg. VTI   ratio of LVOT to aortic valve: 0.48. Valve area (VTI): 1.5 cm^2. - Mitral valve: Calcified annulus. There was trivial regurgitation. - Right atrium: Central venous pressure (est): 3 mm Hg. -  Atrial septum: No defect or patent foramen ovale was identified. - Tricuspid valve: There was trivial regurgitation. - Pulmonary  arteries: Systolic pressure could not be accurately   estimated. - Pericardium, extracardiac: There was no pericardial effusion.   Impressions:   - Moderate LVH with LVEF 60-65% and grade 1 diastolic dysfunction.   Mild calcific aortic stenosis as outlined. Trivial tricuspid   regurgitation, unable to assess PASP.     03/2016 Carotid US Mild bilateral disease      Laboratory Data:  Chemistry Recent Labs Lab 10/20/17 0124  NA 137  K 2.9*  CL 102  CO2 23  GLUCOSE 134*  BUN 29*  CREATININE 1.13*  CALCIUM 9.0  GFRNONAA 51*  GFRAA 59*  ANIONGAP 12     Recent Labs Lab 10/20/17 0311  PROT 7.2  ALBUMIN 3.8  AST 23  ALT 43  ALKPHOS 84  BILITOT 0.7   Hematology Recent Labs Lab 10/20/17 0124  WBC 19.3*  RBC 5.19*  HGB 13.6  HCT 41.4  MCV 79.8  MCH 26.2  MCHC 32.9  RDW 17.3*  PLT 255   Cardiac Enzymes Recent Labs Lab 10/20/17 0311  TROPONINI <0.03    Recent Labs Lab 10/20/17 0144  TROPIPOC 0.01    BNPNo results for input(s): BNP, PROBNP in the last 168 hours.  DDimer No results for input(s): DDIMER in the last 168 hours.  Radiology/Studies:  Dg Chest Port 1 View  Result Date: 10/20/2017 CLINICAL DATA:  Chest pain EXAM: PORTABLE CHEST 1 VIEW COMPARISON:  Chest radiograph 06/20/2015 Chest CT 06/17/2015 FINDINGS: Cardiomegaly is unchanged. There are median sternotomy wires and a prosthetic valve. There is no focal airspace consolidation or pulmonary edema. No pneumothorax or sizable pleural effusion. IMPRESSION: Mild cardiomegaly without acute cardiopulmonary disease. Electronically Signed   By: Ulyses Jarred M.D.   On: 10/20/2017 02:22    Assessment and Plan:   1. Chest pain worrisome and relieved with NTG. Abnormal EKG at baseline. Will need cath vs stress test. Will discuss with Dr. Harl Bowie 2. Tachypalpitations in past  several weeks-will need event monitor. 3. CAD status post CABG x2 in 2015 for left main disease with normal LV function 4. Mild aortic stenosis on echo 2016 needs repeat echo today 5. Hypertension controlled on cozaar and metoprolol 6. Hyperlipidemia 7. Diabetes mellitus type 2 8. Elevated WBC but on steroids for gout 9. Hypokalemia-replace and repeat bmet   For questions or updates, please contact [place] Please consult [email or web address] for contact info under Cardiology/STEMI.   Sumner Boast, PA-C  10/20/2017 7:53 AM Attending note  Patient seen and discussed with PA Bonnell Public, I agree with her documentation above. 61 yo female history of CAD with prior CABG in 2015, HTN, HL, mild AS admitted with chest pain early this morning.  She reports symptoms started last night around 10pm while laying in bed. Pressure across upper chest between both shoulders, 7/10 in severity. No other associated symptoms. Not positional. Lasted just a few minutes, but had multiple episodes at home. Also reports intermittent palpitations over the last few weeks, though does endorse heavy caffeine intake.    K 2.9, Cr 1.13, WBC 19.3, Plt 255,  Trop neg x 1 EKG SR, chronic ST/T changes CXR mild cardiomegaly 05/2015 Echo LVEF 60-65%, no WMAs, grade I diastolic dysfunction, mild AS  Patient admitted early this AM with chest pain, has not completed her rule out. Cycle enzymes and EKG today, obtain echo. EKG abnormal but unchanged from baseline. Pending initial workup we will decide on noninvasive vs invasive ischemic testing tomorrow morning. If recurrence of chest pain would start heparin  gtt. Make npo at midnight.  Carlyle Dolly MD

## 2017-10-20 NOTE — Care Management (Signed)
Pt seen to deliver MOON. Pt is from home, ind with ADL's, has PCP, transportation and insurance with drug coverage. She communicates no DC needs or concerns.

## 2017-10-20 NOTE — ED Provider Notes (Signed)
[facility] EMERGENCY DEPARTMENT Provider Note   CSN: [id number] Arrival date & time: 10/20/17  0114  Time seen 01:45 AM  History   Chief Complaint Chief Complaint  Patient presents with  . Chest Pain    HPI Karina Green is a 61 y.o. female.  HPI patient states about 10 PM she was laying in bed with her granddaughter and she started having anterior chest pain that she states goes from one shoulder to the other.  She states the pain is dull.  She states the pain comes and goes and last about a minute had it multiple times.  She currently is not having any discomfort.  She states nothing she does makes it come on her feel worse and nothing she does makes it feel better.  She also notes that this was the first time in 3 years that she has laid flat in a bed since she had her open heart surgery.  Normally she sits up in a recliner.  She denies shortness of breath, diaphoresis, but she does get some nausea with the discomfort without vomiting.  She states this is different from when she had her chest pain when she had her open heart surgery done, she states that was pressure like someone sitting on her chest.  She states she had double bypass surgery and she did not have any stents before or after her surgery.  She states she took some Benadryl for allergies tonight otherwise nothing was new.  She has taken no other medications.  She states she is followed by Dr. Harl Bowie, cardiologist.  She states that they are following her for a heart murmur.  [facility] Cardiology Dr Harl Bowie  Past Medical History:  Diagnosis Date  . Arthritis   . Back pain   . CAD (coronary artery disease), native coronary artery-70% LM stenosis 10/25/2014  . Cough last 30 yrs    nonproductive, no fevers  . DM type 2 (diabetes mellitus, type 2) ([place]) 10/25/14  . Gout   . Hyperlipidemia   . Hypertension     Patient Active Problem List   Diagnosis Date Noted  . Chest pain 10/20/2017  .  Primary osteoarthritis of left hip 05/07/2016  . Chronic diastolic heart failure ([place]) 06/20/2015  . Aortic valve stenosis, mild 06/20/2015  . CKD (chronic kidney disease), stage III ([place]) 06/20/2015  . Hypokalemia 06/20/2015  . Sepsis ([place]) 06/20/2015  . Acute respiratory failure with hypoxia ([place]) 06/20/2015  . CAP (community acquired pneumonia) 06/17/2015  . CAD (coronary artery disease), native coronary artery-70% LM stenosis 10/26/2014  . DM2 (diabetes mellitus, type 2) ([place]) 10/26/2014  . Unstable angina ([place]) 10/25/2014  . Essential hypertension 10/24/2014  . Hypercholesteremia 10/24/2014    Past Surgical History:  Procedure Laterality Date  . ABDOMINAL HYSTERECTOMY     complete  . APPENDECTOMY    . bladder tack    . CARDIAC CATHETERIZATION  10/25/14   70% LM stensis, normal EF  . CHOLECYSTECTOMY    . CORONARY ARTERY BYPASS GRAFT N/A 10/29/2014   Procedure: CORONARY ARTERY BYPASS GRAFTING (CABG) TIMES TWO USING LEFT INTERNAL MAMMARY ARTERY AND RIGHT LEG GREATER SAPHENOUS VEIN HARVESTED ENDOSCOPICALLY.;  Surgeon: Ivin Poot, MD;  Location: [place];  Service: Open Heart Surgery;  Laterality: N/A;  . HERNIA REPAIR    . INTRAOPERATIVE TRANSESOPHAGEAL ECHOCARDIOGRAM N/A 10/29/2014   Procedure: INTRAOPERATIVE TRANSESOPHAGEAL ECHOCARDIOGRAM;  Surgeon: Ivin Poot, MD;  Location: [place];  Service: Open Heart Surgery;  Laterality: N/A;  . JOINT REPLACEMENT Right 2009   hip total  . LEFT HEART CATHETERIZATION WITH CORONARY ANGIOGRAM N/A 10/26/2014   Procedure: LEFT HEART CATHETERIZATION WITH CORONARY ANGIOGRAM;  Surgeon: Troy Sine, MD;  Location: [facility] CATH LAB;  Service: Cardiovascular;  Laterality: N/A;  . TONSILLECTOMY    . TOTAL HIP ARTHROPLASTY    . TOTAL HIP ARTHROPLASTY Left 05/07/2016   Procedure: TOTAL HIP ARTHROPLASTY ANTERIOR APPROACH;  Surgeon: Rod Can, MD;  Location: [facility];  Service: Orthopedics;  Laterality: Left;    OB History    No data available        Home Medications    Prior to Admission medications   Medication Sig Start Date End Date Taking? Authorizing Provider  aspirin 81 MG tablet Take 1 tablet (81 mg total) by mouth 2 (two) times daily after a meal. 05/08/16  Yes Swinteck, Aaron Edelman, MD  atorvastatin (LIPITOR) 80 MG tablet TAKE ONE TABLET BY MOUTH ONCE DAILY AT 6 P.M. 03/27/16  Yes Branch, Alphonse Guild, MD  furosemide (LASIX) 20 MG tablet Take 1 tablet (20 mg total) by mouth daily. 03/27/16  Yes Branch, Alphonse Guild, MD  glimepiride (AMARYL) 1 MG tablet Take 1 mg by mouth daily with breakfast.   Yes [provider]  losartan (COZAAR) 25 MG tablet Take 0.5 tablets (12.5 mg total) by mouth daily. 03/27/16  Yes Branch, Alphonse Guild, MD  metoprolol tartrate (LOPRESSOR) 25 MG tablet Take 0.5 tablets (12.5 mg total) by mouth 2 (two) times daily. 03/27/16  Yes Branch, Alphonse Guild, MD  ALPRAZolam Duanne Moron) 0.5 MG tablet Take 0.5 mg by mouth daily as needed. Panic attacks/anxiety.    [provider]  docusate sodium (COLACE) 100 MG capsule Take 1 capsule (100 mg total) by mouth 2 (two) times daily. Patient not taking: Reported on 03/15/2017 05/08/16   Rod Can, MD  HYDROcodone-acetaminophen (NORCO) 5-325 MG tablet Take 1-2 tablets by mouth every 4 (four) hours as needed for moderate pain. Patient not taking: Reported on 03/15/2017 05/08/16   Rod Can, MD    Family History Family History  Problem Relation Age of Onset  . Hypertension Mother   . Coronary artery disease Mother        CABG  . Hypertension Father   . CAD Father        CABG  . Coronary artery disease Brother        Stent    Social History Social History  Substance Use Topics  . Smoking status: Never Smoker  . Smokeless tobacco: Never Used  . Alcohol use No     Allergies   Patient has no known allergies.   Review of Systems Review of Systems  All other systems reviewed and are negative.    Physical Exam Updated Vital Signs BP (!) 142/79 (BP  Location: Right Arm)   Pulse 65   Temp 98.1 F (36.7 C) (Oral)   Resp 16   Ht 5\' 3"  (1.6 m)   Wt 101.6 kg (224 lb)   SpO2 100%   BMI 39.68 kg/m   Vital signs normal    Physical Exam  Constitutional: She is oriented to person, place, and time. She appears well-developed and well-nourished.  Non-toxic appearance. She does not appear ill. No distress.  HENT:  Head: Normocephalic and atraumatic.  Right Ear: External ear normal.  Left Ear: External ear normal.  Nose: Nose normal. No mucosal edema or rhinorrhea.  Mouth/Throat: Oropharynx is clear and moist and mucous membranes are  normal. No dental abscesses or uvula swelling.  Eyes: Pupils are equal, round, and reactive to light. Conjunctivae and EOM are normal.  Neck: Normal range of motion and full passive range of motion without pain. Neck supple.  Cardiovascular: Normal rate and regular rhythm.  Exam reveals no gallop and no friction rub.   Murmur heard.  Systolic murmur is present  Heard best in the RUSB and LUSB  Pulmonary/Chest: Effort normal and breath sounds normal. No respiratory distress. She has no wheezes. She has no rhonchi. She has no rales. She exhibits no tenderness and no crepitus.    Area of chest pain noted  Abdominal: Soft. Normal appearance and bowel sounds are normal. She exhibits no distension. There is no tenderness. There is no rebound and no guarding.  Musculoskeletal: Normal range of motion. She exhibits no edema or tenderness.  Moves all extremities well.   Neurological: She is alert and oriented to person, place, and time. She has normal strength. No cranial nerve deficit.  Skin: Skin is warm, dry and intact. No rash noted. No erythema. No pallor.  Psychiatric: She has a normal mood and affect. Her speech is normal and behavior is normal. Her mood appears not anxious.  Nursing note and vitals reviewed.    ED Treatments / Results  Labs (all labs ordered are listed, but only abnormal results are  displayed) Results for orders placed or performed during the hospital encounter of 29/52/84  Basic metabolic panel  Result Value Ref Range   Sodium 137 135 - 145 mmol/L   Potassium 2.9 (L) 3.5 - 5.1 mmol/L   Chloride 102 101 - 111 mmol/L   CO2 23 22 - 32 mmol/L   Glucose, Bld 134 (H) 65 - 99 mg/dL   BUN 29 (H) 6 - 20 mg/dL   Creatinine, Ser 1.13 (H) 0.44 - 1.00 mg/dL   Calcium 9.0 8.9 - 10.3 mg/dL   GFR calc non Af Amer 51 (L) >60 mL/min   GFR calc Af Amer 59 (L) >60 mL/min   Anion gap 12 5 - 15  CBC  Result Value Ref Range   WBC 19.3 (H) 4.0 - 10.5 K/uL   RBC 5.19 (H) 3.87 - 5.11 MIL/uL   Hemoglobin 13.6 12.0 - 15.0 g/dL   HCT 41.4 36.0 - 46.0 %   MCV 79.8 78.0 - 100.0 fL   MCH 26.2 26.0 - 34.0 pg   MCHC 32.9 30.0 - 36.0 g/dL   RDW 17.3 (H) 11.5 - 15.5 %   Platelets 255 150 - 400 K/uL  Troponin I  Result Value Ref Range   Troponin I <0.03 <0.03 ng/mL  I-stat troponin, ED  Result Value Ref Range   Troponin i, poc 0.01 0.00 - 0.08 ng/mL   Comment 3           Laboratory interpretation all normal except Hypokalemia, leukocytosis, renal insufficiency    EKG  EKG Interpretation  Date/Time:  Wednesday October 20 2017 01:40:45 EDT Ventricular Rate:  65 PR Interval:    QRS Duration: 85 QT Interval:  430 QTC Calculation: 448 R Axis:   3 Text Interpretation:  Sinus rhythm Inferoposterior infarct, acute (RCA) Probable RV involvement, suggest recording right precordial leads No significant change since last tracing 03 May 2017 Confirmed by Rolland Porter [phone]) on 10/20/2017 1:44:51 AM       Radiology Dg Chest Port 1 View  Result Date: 10/20/2017 CLINICAL DATA:  Chest pain EXAM: PORTABLE CHEST 1 VIEW COMPARISON:  Chest radiograph  06/20/2015 Chest CT 06/17/2015 FINDINGS: Cardiomegaly is unchanged. There are median sternotomy wires and a prosthetic valve. There is no focal airspace consolidation or pulmonary edema. No pneumothorax or sizable pleural effusion. IMPRESSION: Mild  cardiomegaly without acute cardiopulmonary disease. Electronically Signed   By: Ulyses Jarred M.D.   On: 10/20/2017 02:22    Procedures Procedures (including critical care time)  Medications Ordered in ED Medications  potassium chloride SA (K-DUR,KLOR-CON) CR tablet 40 mEq (40 mEq Oral Given 10/20/17 0221)  nitroGLYCERIN (NITROGLYN) 2 % ointment 1 inch (1 inch Topical Given 10/20/17 0225)  aspirin tablet 325 mg (325 mg Oral Given 10/20/17 0222)     Initial Impression / Assessment and Plan / ED Course  I have reviewed the triage vital signs and the nursing notes.  Pertinent labs & imaging results that were available during my care of the patient were reviewed by me and considered in my medical decision making (see chart for details).     When I first get patient's EKG I was going to call code STEMI however when I compare it to her last EKG in May it is very similar with may be some slight increased depression in her septal leads.  After talking to patient she was given aspirin to chew and nitroglycerin paste.  4:39 AM patient's delta troponin is negative although it was drawn 1/2-hour early.  I talked to Dr.Wosik, cardiologist at call at [facility].  I was concerned about her EKG which look like an acute inferior posterior MI however it was similar to EKG done in May.  He feels that she has enough risk factors that she should be admitted for observation and see the cardiologist here in the morning.  I did not start her on heparin and he agrees at this time, if her pain returns that should be considered.  I am going to talk to the patient to see if she is agreeable for admission.  04:45 AM patient is agreeable to admission, hospitalist consulted. She states she has had about 4 episodes of chest pain since I interviewed her.   04:58 AM Dr Olevia Bowens, hospitalist, will admit.   Final Clinical Impressions(s) / ED Diagnoses   Final diagnoses:  Chest pain, unspecified type  Abnormal EKG     Plan admission  Rolland Porter, MD, Barbette Or, MD 10/20/17 [phone]

## 2017-10-20 NOTE — Care Management Obs Status (Signed)
[place] NOTIFICATION   Patient Details  Name: Karina Green MRN: [id number] Date of Birth: [birth year]   Medicare Observation Status Notification Given:  Yes    Sherald Barge, RN 10/20/2017, 1:58 PM

## 2017-10-20 NOTE — Progress Notes (Signed)
*  PRELIMINARY RESULTS* Echocardiogram 2D Echocardiogram has been performed.  Karina Green 10/20/2017, 12:44 PM

## 2017-10-20 NOTE — Progress Notes (Addendum)
CRITICAL VALUE ALERT  Critical Value:  tropi 0.03  Date & Time Notied: 10/20/2017 1030  Provider Notified: Memon  Orders Received/Actions taken: pt asymptomatic will continue to follow up per MD

## 2017-10-20 NOTE — ED Notes (Signed)
Pt reports having pain across her chest that started around 10pm. Pt denies having chest pain at this time.

## 2017-10-20 NOTE — Progress Notes (Signed)
Patient admitted to the hospital earlier this morning by Dr. Olevia Bowens.  Patient seen and examined.  She is no longer having any chest pain.  No shortness of breath.  Physical exam is unremarkable.  Patient was admitted to the hospital with complaints of chest discomfort.  She is being followed by cardiology.  Plans are to rule out for ACS by cycling cardiac markers.  She will be kept n.p.o. after midnight.  Possibly invasive versus noninvasive ischemic workup to be performed in a.m.  She has not had any chest discomfort since nitroglycerin has been applied.  If the patient does have any recurrent pain or has elevated troponin, she will need to be started on heparin infusion.  Echocardiogram is also been ordered.  Karina Green

## 2017-10-20 NOTE — ED Triage Notes (Signed)
Pt states chest pains that started about an hour ago. Pt says laying down when pain started,

## 2017-10-20 NOTE — H&P (Signed)
History and Physical    Karina Green MCN:[id number] DOB: 04-17-1956 DOA: 10/20/2017  PCP: [place], [place]   Patient coming from: Home.  I have personally briefly reviewed patient's old medical records in [place]  Chief Complaint: Chest pain.  HPI: Karina Green is a 61 y.o. female with medical history significant of osteoarthritis, back pain, CAD, type 2 diabetes, gout, hyperlipidemia, hypertension, chronic diastolic heart failure who is coming to the emergency department with complaints of chest pain since late yesterday evening while she was lying down, radiates to her back and both of her shoulders, pain was dull, associated with nausea, but denies emesis, dyspnea, diaphoresis, palpitations, dizziness, recent lower extremity edema, PND or orthopnea. She denies abdominal pain, constipation, diarrhea, melena or hematochezia. No GU symptoms. Denies blurry vision, polyuria or polydipsia.  ED Course: Initial vital signs in the emergency department 98.17F, pulse 64, respirations 19, blood pressure 149/79 and O2 sat 99%. She received aspirin 324 mg and K Dur 40 mEq by mouth in the emergency department.   Her workup shows an EKG with ST segment abnormalities and T-wave abnormalities on aVL, V1, V2, V3 similar to previous tracings. Troponin level was negative 2. Her urinalysis was normal. CBC showed WBC of 19.3, hemoglobin 13.6 g/dL and platelets 137. Chemistry significant for hypokalemia with a potassium of 2.9 mmol/L, BUN was 29, creatinine 1.13 and glucose 134 mg/dL. LFTs were normal. Her chest radiograph does not show any acute cardiopulmonary pathology.  Review of Systems: As per HPI otherwise 10 point review of systems negative.    Past Medical History:  Diagnosis Date  . Arthritis   . Back pain   . CAD (coronary artery disease), native coronary artery-70% LM stenosis 10/25/2014  . Cough last 30 yrs    nonproductive, no fevers  . DM type 2 (diabetes mellitus,  type 2) ([place]) 10/25/14  . Gout   . Hyperlipidemia   . Hypertension     Past Surgical History:  Procedure Laterality Date  . ABDOMINAL HYSTERECTOMY     complete  . APPENDECTOMY    . bladder tack    . CARDIAC CATHETERIZATION  10/25/14   70% LM stensis, normal EF  . CHOLECYSTECTOMY    . CORONARY ARTERY BYPASS GRAFT N/A 10/29/2014   Procedure: CORONARY ARTERY BYPASS GRAFTING (CABG) TIMES TWO USING LEFT INTERNAL MAMMARY ARTERY AND RIGHT LEG GREATER SAPHENOUS VEIN HARVESTED ENDOSCOPICALLY.;  Surgeon: Ivin Poot, MD;  Location: [facility];  Service: Open Heart Surgery;  Laterality: N/A;  . HERNIA REPAIR    . INTRAOPERATIVE TRANSESOPHAGEAL ECHOCARDIOGRAM N/A 10/29/2014   Procedure: INTRAOPERATIVE TRANSESOPHAGEAL ECHOCARDIOGRAM;  Surgeon: Ivin Poot, MD;  Location: [facility];  Service: Open Heart Surgery;  Laterality: N/A;  . JOINT REPLACEMENT Right 2009   hip total  . LEFT HEART CATHETERIZATION WITH CORONARY ANGIOGRAM N/A 10/26/2014   Procedure: LEFT HEART CATHETERIZATION WITH CORONARY ANGIOGRAM;  Surgeon: Troy Sine, MD;  Location: [facility] CATH LAB;  Service: Cardiovascular;  Laterality: N/A;  . TONSILLECTOMY    . TOTAL HIP ARTHROPLASTY    . TOTAL HIP ARTHROPLASTY Left 05/07/2016   Procedure: TOTAL HIP ARTHROPLASTY ANTERIOR APPROACH;  Surgeon: Rod Can, MD;  Location: [facility];  Service: Orthopedics;  Laterality: Left;     reports that she has never smoked. She has never used smokeless tobacco. She reports that she does not drink alcohol or use drugs.  No Known Allergies  Family History  Problem Relation Age of Onset  . Hypertension  Mother   . Coronary artery disease Mother        CABG  . Hypertension Father   . CAD Father        CABG  . Coronary artery disease Brother        Stent    Prior to Admission medications   Medication Sig Start Date End Date Taking? Authorizing Provider  aspirin 81 MG tablet Take 1 tablet (81 mg total) by mouth 2 (two) times daily after a meal.  05/08/16  Yes Swinteck, Aaron Edelman, MD  atorvastatin (LIPITOR) 80 MG tablet TAKE ONE TABLET BY MOUTH ONCE DAILY AT 6 P.M. 03/27/16  Yes Branch, Alphonse Guild, MD  furosemide (LASIX) 20 MG tablet Take 1 tablet (20 mg total) by mouth daily. 03/27/16  Yes Branch, Alphonse Guild, MD  glimepiride (AMARYL) 1 MG tablet Take 1 mg by mouth daily with breakfast.   Yes [provider]  losartan (COZAAR) 25 MG tablet Take 0.5 tablets (12.5 mg total) by mouth daily. 03/27/16  Yes Branch, Alphonse Guild, MD  metoprolol tartrate (LOPRESSOR) 25 MG tablet Take 0.5 tablets (12.5 mg total) by mouth 2 (two) times daily. 03/27/16  Yes Branch, Alphonse Guild, MD  ALPRAZolam Duanne Moron) 0.5 MG tablet Take 0.5 mg by mouth daily as needed. Panic attacks/anxiety.    [provider]  docusate sodium (COLACE) 100 MG capsule Take 1 capsule (100 mg total) by mouth 2 (two) times daily. Patient not taking: Reported on 03/15/2017 05/08/16   Rod Can, MD  HYDROcodone-acetaminophen (NORCO) 5-325 MG tablet Take 1-2 tablets by mouth every 4 (four) hours as needed for moderate pain. Patient not taking: Reported on 03/15/2017 05/08/16   Rod Can, MD    Physical Exam: Vitals:   10/20/17 0400 10/20/17 0430 10/20/17 0500 10/20/17 0530  BP: 128/68 (!) 137/55 (!) 119/99 135/63  Pulse: 68 63 70 60  Resp: 20 19 16 13   Temp:      TempSrc:      SpO2: 93% 96% 93% 94%  Weight:      Height:        Constitutional: NAD, calm, comfortable Eyes: PERRL, lids and conjunctivae normal ENMT: Mucous membranes are moist. Posterior pharynx clear of any exudate or lesions. Neck: normal, supple, no masses, no thyromegaly Respiratory: clear to auscultation bilaterally, no wheezing, no crackles. Normal respiratory effort. No accessory muscle use.  Cardiovascular: Bradycardic at 58 BPM, positive 2/6 systolic murmur, no rubs / gallops. No extremity edema. 2+ pedal pulses. No carotid bruits.  Abdomen: no tenderness, no masses palpated. No hepatosplenomegaly.  Bowel sounds positive.  Musculoskeletal: no clubbing / cyanosis.  Good ROM, no contractures. Normal muscle tone.  Skin: no rashes, lesions, ulcers. No induration Neurologic: CN 2-12 grossly intact. Sensation intact, DTR normal. Strength 5/5 in all 4.  Psychiatric: Normal judgment and insight. Alert and oriented x 4. Normal mood.    Labs on Admission: I have personally reviewed following labs and imaging studies  CBC:  Recent Labs Lab 10/20/17 0124  WBC 19.3*  HGB 13.6  HCT 41.4  MCV 79.8  PLT 026   Basic Metabolic Panel:  Recent Labs Lab 10/20/17 0124 10/20/17 0311  NA 137  --   K 2.9*  --   CL 102  --   CO2 23  --   GLUCOSE 134*  --   BUN 29*  --   CREATININE 1.13*  --   CALCIUM 9.0  --   MG  --  1.9   GFR: Estimated Creatinine  Clearance: 58 mL/min (A) (by C-G formula based on SCr of 1.13 mg/dL (H)). Liver Function Tests:  Recent Labs Lab 10/20/17 0311  AST 23  ALT 43  ALKPHOS 84  BILITOT 0.7  PROT 7.2  ALBUMIN 3.8   No results for input(s): LIPASE, AMYLASE in the last 168 hours. No results for input(s): AMMONIA in the last 168 hours. Coagulation Profile: No results for input(s): INR, PROTIME in the last 168 hours. Cardiac Enzymes:  Recent Labs Lab 10/20/17 0311  TROPONINI <0.03   BNP (last 3 results) No results for input(s): PROBNP in the last 8760 hours. HbA1C: No results for input(s): HGBA1C in the last 72 hours. CBG: No results for input(s): GLUCAP in the last 168 hours. Lipid Profile: No results for input(s): CHOL, HDL, LDLCALC, TRIG, CHOLHDL, LDLDIRECT in the last 72 hours. Thyroid Function Tests: No results for input(s): TSH, T4TOTAL, FREET4, T3FREE, THYROIDAB in the last 72 hours. Anemia Panel: No results for input(s): VITAMINB12, FOLATE, FERRITIN, TIBC, IRON, RETICCTPCT in the last 72 hours. Urine analysis:    Component Value Date/Time   COLORURINE YELLOW 10/20/2017 [place] 10/20/2017 0459   LABSPEC 1.012  10/20/2017 0459   PHURINE 6.0 10/20/2017 0459   GLUCOSEU NEGATIVE 10/20/2017 0459   HGBUR NEGATIVE 10/20/2017 [place] 10/20/2017 [place] 10/20/2017 0459   PROTEINUR NEGATIVE 10/20/2017 0459   UROBILINOGEN 0.2 06/17/2015 2028   NITRITE NEGATIVE 10/20/2017 0459   LEUKOCYTESUR NEGATIVE 10/20/2017 0459    Radiological Exams on Admission: Dg Chest Port 1 View  Result Date: 10/20/2017 CLINICAL DATA:  Chest pain EXAM: PORTABLE CHEST 1 VIEW COMPARISON:  Chest radiograph 06/20/2015 Chest CT 06/17/2015 FINDINGS: Cardiomegaly is unchanged. There are median sternotomy wires and a prosthetic valve. There is no focal airspace consolidation or pulmonary edema. No pneumothorax or sizable pleural effusion. IMPRESSION: Mild cardiomegaly without acute cardiopulmonary disease. Electronically Signed   By: Ulyses Jarred M.D.   On: 10/20/2017 02:22  Echocardiogram 06/18/2015 ------------------------------------------------------------------- LV EF: 60% -   65%  ------------------------------------------------------------------- History:   PMH:  Assess LV Function.  Coronary artery disease. Risk factors:  Hypertension. Diabetes mellitus.  ------------------------------------------------------------------- Study Conclusions  - Left ventricle: The cavity size was normal. Wall thickness was   increased in a pattern of moderate LVH. Systolic function was   normal. The estimated ejection fraction was in the range of 60%   to 65%. Wall motion was normal; there were no regional wall   motion abnormalities. Doppler parameters are consistent with   abnormal left ventricular relaxation (grade 1 diastolic   dysfunction). - Aortic valve: Trileaflet; moderately calcified leaflets. Left   coronary cusp mobility was restricted. There was mild stenosis.   Mean gradient (S): 16 mm Hg. Peak gradient (S): 27 mm Hg. VTI   ratio of LVOT to aortic valve: 0.48. Valve area (VTI): 1.5  cm^2. - Mitral valve: Calcified annulus. There was trivial regurgitation. - Right atrium: Central venous pressure (est): 3 mm Hg. - Atrial septum: No defect or patent foramen ovale was identified. - Tricuspid valve: There was trivial regurgitation. - Pulmonary arteries: Systolic pressure could not be accurately   estimated. - Pericardium, extracardiac: There was no pericardial effusion.  Impressions:  - Moderate LVH with LVEF 60-65% and grade 1 diastolic dysfunction.   Mild calcific aortic stenosis as outlined. Trivial tricuspid   regurgitation, unable to assess PASP.   EKG: Independently reviewed.  Vent. rate 65 BPM PR interval * ms QRS duration  85 ms QT/QTc 430/448 ms P-R-T axes 38 3 121 Sinus rhythm Inferoposterior infarct, acute (RCA) Probable RV involvement, suggest recording right precordial leads No significant change since May 2018 tracing.  Assessment/Plan Principal Problem:   Chest pain Telemetry /observation. EKG is very impressive, but unchanged. Trend troponin levels. Cardiology on-call request a cardiology evaluation in the morning.  Active Problems:   Essential hypertension Continue losartan 12.5 mg by mouth daily. Continue metoprolol 12.5 mg by mouth twice a day. Continue furosemide 20 mg by mouth daily. Monitor blood pressure, electrolytes and renal function. She will need regular potassium supplementation on discharge.    Hypercholesteremia Continue atorvastatin 80 mg by mouth daily. Monitor LFTs as needed.    CAD (coronary artery disease), native coronary artery-70% LM stenosis Continue aspirin, atorvastatin, losartan and metoprolol.    Chronic diastolic heart failure (HCC) No signs of decompensation at this time. Continue beta blocker, ARB and diuretics.    Hypokalemia Replacing. Magnesium was supplemented. The patient will need potassium supplementation prescription on discharge.    DM2 (diabetes mellitus, type 2) (HCC) Last  hemoglobin A1c 6.7% in May 2017. Carbohydrate modified diet. Continue Amaryl 1 mg by mouth daily. CBG monitoring with regular insulin sliding scale in the hospital.    Leukocytosis. No fever, no chills or signs of infection. Chest radiograph and urinalysis did not show signs of infection. The patient recently had an exacerbation of gout arthropathy and was given prednisone. Follow-up CBC later today.   DVT prophylaxis: Lovenox SQ. Code Status: Full code. Family Communication:  Disposition Plan: Telemetry observation, troponin level trending and cardiology evaluation. Consults called: Routine cardiology consult. Admission status: Observation/telemetry.   Reubin Milan MD [facility] Pager [phone].  If 7PM-7AM, please contact night-coverage [email or web address] Password [email or web address]  10/20/2017, 6:18 AM    This document was prepared using dragon voice recognition software and may contain some unintended errors.

## 2017-10-21 ENCOUNTER — Encounter (HOSPITAL_COMMUNITY): Payer: Self-pay

## 2017-10-21 ENCOUNTER — Observation Stay (HOSPITAL_BASED_OUTPATIENT_CLINIC_OR_DEPARTMENT_OTHER): Payer: Medicare HMO

## 2017-10-21 DIAGNOSIS — I2510 Atherosclerotic heart disease of native coronary artery without angina pectoris: Secondary | ICD-10-CM

## 2017-10-21 DIAGNOSIS — E1122 Type 2 diabetes mellitus with diabetic chronic kidney disease: Secondary | ICD-10-CM

## 2017-10-21 DIAGNOSIS — I10 Essential (primary) hypertension: Secondary | ICD-10-CM | POA: Diagnosis not present

## 2017-10-21 DIAGNOSIS — E7800 Pure hypercholesterolemia, unspecified: Secondary | ICD-10-CM | POA: Diagnosis not present

## 2017-10-21 DIAGNOSIS — R079 Chest pain, unspecified: Secondary | ICD-10-CM | POA: Diagnosis not present

## 2017-10-21 DIAGNOSIS — I5032 Chronic diastolic (congestive) heart failure: Secondary | ICD-10-CM | POA: Diagnosis not present

## 2017-10-21 LAB — NM MYOCAR MULTI W/SPECT W/WALL MOTION / EF
CHL CUP NUCLEAR SDS: 2
CHL CUP RESTING HR STRESS: 57 {beats}/min
CSEPPHR: 82 {beats}/min
LHR: 0.34
LVDIAVOL: 45 mL (ref 46–106)
LVSYSVOL: 17 mL
SRS: 2
SSS: 4
TID: 1.3

## 2017-10-21 LAB — GLUCOSE, CAPILLARY
GLUCOSE-CAPILLARY: 125 mg/dL — AB (ref 65–99)
Glucose-Capillary: 123 mg/dL — ABNORMAL HIGH (ref 65–99)

## 2017-10-21 LAB — HIV ANTIBODY (ROUTINE TESTING W REFLEX): HIV Screen 4th Generation wRfx: NONREACTIVE

## 2017-10-21 MED ORDER — SODIUM CHLORIDE 0.9% FLUSH
INTRAVENOUS | Status: AC
  Administered 2017-10-21: 10 mL via INTRAVENOUS
  Filled 2017-10-21: qty 10

## 2017-10-21 MED ORDER — TECHNETIUM TC 99M TETROFOSMIN IV KIT
30.0000 | PACK | Freq: Once | INTRAVENOUS | Status: AC | PRN
  Administered 2017-10-21: 32 via INTRAVENOUS

## 2017-10-21 MED ORDER — ISOSORBIDE MONONITRATE ER 30 MG PO TB24: 15.0000 mg | ORAL_TABLET | Freq: Every day | ORAL | 0 refills | Status: DC

## 2017-10-21 MED ORDER — REGADENOSON 0.4 MG/5ML IV SOLN
INTRAVENOUS | Status: AC
  Administered 2017-10-21: 0.4 mg via INTRAVENOUS
  Filled 2017-10-21: qty 5

## 2017-10-21 MED ORDER — TECHNETIUM TC 99M TETROFOSMIN IV KIT
10.0000 | PACK | Freq: Once | INTRAVENOUS | Status: AC | PRN
  Administered 2017-10-21: 10.6 via INTRAVENOUS

## 2017-10-21 MED ORDER — SODIUM CHLORIDE 0.9% FLUSH
INTRAVENOUS | Status: AC
  Administered 2017-10-21: 09:00:00
  Filled 2017-10-21: qty 180

## 2017-10-21 MED ORDER — POTASSIUM CHLORIDE CRYS ER 20 MEQ PO TBCR: 20.0000 meq | EXTENDED_RELEASE_TABLET | Freq: Every day | ORAL | 0 refills | Status: DC

## 2017-10-21 MED ORDER — ISOSORBIDE MONONITRATE ER 30 MG PO TB24
15.0000 mg | ORAL_TABLET | Freq: Every day | ORAL | Status: DC
  Administered 2017-10-21: 15 mg via ORAL
  Filled 2017-10-21: qty 1

## 2017-10-21 NOTE — Discharge Summary (Signed)
Physician Discharge Summary  Karina Green ENI:[id number] DOB: Feb 27, 1956 DOA: 10/20/2017  PCP: [facility]  Admit date: 10/20/2017 Discharge date: 10/21/2017  Admitted From: Home Disposition: Home  Recommendations for Outpatient Follow-up:  1. Follow up with PCP in 1-2 weeks 2. Please obtain BMP/CBC in one week 3. Follow-up with cardiology in 2 weeks  Home Health: Equipment/Devices:  Discharge Condition: stable CODE STATUS: full code Diet recommendation: Heart Healthy / Carb Modified  Brief/Interim Summary: 61 year old female with a history of hypertension, diabetes, chronic diastolic heart failure, admitted to the hospital with complaints of chest discomfort.  She was seen by cardiology and underwent stress testing.  This was found to be a low risk study.  She ruled out for ACS with negative cardiac markers.  It was recommended that she continue on current medical regimen including aspirin, ARB, beta blockers and statin.  She is also been started on low-dose Imdur.  Patient will follow up with cardiology in the next 2 weeks.  She is otherwise pain-free at this time and feels ready for discharge home.  Discharge Diagnoses:  Principal Problem:   Chest pain Active Problems:   Essential hypertension   Hypercholesteremia   CAD (coronary artery disease), native coronary artery-70% LM stenosis   DM2 (diabetes mellitus, type 2) (HCC)   Chronic diastolic heart failure (HCC)   Hypokalemia   Leukocytosis    Discharge Instructions  Discharge Instructions    Diet - low sodium heart healthy    Complete by:  As directed    Increase activity slowly    Complete by:  As directed      Allergies as of 10/21/2017   No Known Allergies     Medication List    STOP taking these medications   ALPRAZolam 0.5 MG tablet Commonly known as:  XANAX   docusate sodium 100 MG capsule Commonly known as:  COLACE   HYDROcodone-acetaminophen 5-325 MG tablet Commonly known  as:  NORCO   predniSONE 10 MG tablet Commonly known as:  DELTASONE     TAKE these medications   allopurinol 100 MG tablet Commonly known as:  ZYLOPRIM Take 1 tablet by mouth daily.   aspirin 81 MG tablet Take 1 tablet (81 mg total) by mouth 2 (two) times daily after a meal. What changed:  when to take this   atorvastatin 80 MG tablet Commonly known as:  LIPITOR TAKE ONE TABLET BY MOUTH ONCE DAILY AT 6 P.M.   colchicine 0.6 MG tablet Take 1 tablet by mouth daily as needed.   furosemide 20 MG tablet Commonly known as:  LASIX Take 1 tablet (20 mg total) by mouth daily.   glimepiride 1 MG tablet Commonly known as:  AMARYL Take 1 mg by mouth daily with breakfast.   isosorbide mononitrate 30 MG 24 hr tablet Commonly known as:  IMDUR Take 0.5 tablets (15 mg total) by mouth daily.   losartan 25 MG tablet Commonly known as:  COZAAR Take 0.5 tablets (12.5 mg total) by mouth daily.   metoprolol tartrate 25 MG tablet Commonly known as:  LOPRESSOR Take 0.5 tablets (12.5 mg total) by mouth 2 (two) times daily.   multivitamin with minerals Tabs tablet Take 1 tablet by mouth daily.   potassium chloride SA 20 MEQ tablet Commonly known as:  K-DUR,KLOR-CON Take 1 tablet (20 mEq total) by mouth daily.       No Known Allergies  Consultations: cardiology  Procedures/Studies: Nm Myocar Multi W/spect W/wall Motion / Ef  Result  Date: 10/21/2017  There was no ST segment deviation noted during stress.  This is a low risk study.  The left ventricular ejection fraction is normal (55-65%).  Small basal inferolateral defect with mild reversibility. This may represent a small prior infarct with mild ischemia, however there is significant adjacent radiotracer uptake which may affect the defect. Either finding would support low risk    Dg Chest Port 1 View  Result Date: 10/20/2017 CLINICAL DATA:  Chest pain EXAM: PORTABLE CHEST 1 VIEW COMPARISON:  Chest radiograph 06/20/2015 Chest  CT 06/17/2015 FINDINGS: Cardiomegaly is unchanged. There are median sternotomy wires and a prosthetic valve. There is no focal airspace consolidation or pulmonary edema. No pneumothorax or sizable pleural effusion. IMPRESSION: Mild cardiomegaly without acute cardiopulmonary disease. Electronically Signed   By: Ulyses Jarred M.D.   On: 10/20/2017 02:22    Echo: - Left ventricle: The cavity size was normal. Wall thickness was   increased in a pattern of mild LVH. Systolic function was normal.   The estimated ejection fraction was in the range of 60% to 65%.   Wall motion was normal; there were no regional wall motion   abnormalities. Doppler parameters are consistent with abnormal   left ventricular relaxation (grade 1 diastolic dysfunction). - Aortic valve: There was mild stenosis. Mean gradient (S): 16 mm   Hg. Mean gradient of 19 by pedhoff probe. Valve area (VTI): 1.8   cm^2. Valve area (Vmax): 1.81 cm^2. Valve area (Vmean): 1.66   cm^2. - Mitral valve: Mildly calcified annulus. Normal thickness leaflets   Subjective: No further chest pain  Discharge Exam: Vitals:   10/21/17 0539 10/21/17 1400  BP: (!) 147/66 135/71  Pulse: (!) 59 66  Resp: 16 18  Temp: 97.6 F (36.4 C) 97.6 F (36.4 C)  SpO2: 96% 97%   Vitals:   10/20/17 2115 10/20/17 2200 10/21/17 0539 10/21/17 1400  BP:  (!) 125/43 (!) 147/66 135/71  Pulse:  68 (!) 59 66  Resp:  20 16 18   Temp:  98.2 F (36.8 C) 97.6 F (36.4 C) 97.6 F (36.4 C)  TempSrc:   Oral   SpO2: 97% 93% 96% 97%  Weight:      Height:        General: Pt is alert, awake, not in acute distress Cardiovascular: RRR, S1/S2 +, no rubs, no gallops Respiratory: CTA bilaterally, no wheezing, no rhonchi Abdominal: Soft, NT, ND, bowel sounds + Extremities: no edema, no cyanosis    The results of significant diagnostics from this hospitalization (including imaging, microbiology, ancillary and laboratory) are listed below for reference.      Microbiology: No results found for this or any previous visit (from the past 240 hour(s)).   Labs: BNP (last 3 results) No results for input(s): BNP in the last 8760 hours. Basic Metabolic Panel:  Recent Labs Lab 10/20/17 0124 10/20/17 0311 10/20/17 0903  NA 137  --  141  K 2.9*  --  3.5  CL 102  --  108  CO2 23  --  23  GLUCOSE 134*  --  105*  BUN 29*  --  24*  CREATININE 1.13*  --  0.95  CALCIUM 9.0  --  8.7*  MG  --  1.9  --    Liver Function Tests:  Recent Labs Lab 10/20/17 0311  AST 23  ALT 43  ALKPHOS 84  BILITOT 0.7  PROT 7.2  ALBUMIN 3.8   No results for input(s): LIPASE, AMYLASE in the last  168 hours. No results for input(s): AMMONIA in the last 168 hours. CBC:  Recent Labs Lab 10/20/17 0124 10/20/17 1440  WBC 19.3* 13.1*  NEUTROABS  --  8.6*  HGB 13.6 12.9  HCT 41.4 40.3  MCV 79.8 80.8  PLT 255 246   Cardiac Enzymes:  Recent Labs Lab 10/20/17 0311 10/20/17 0903 10/20/17 1440  TROPONINI <0.03 0.03* <0.03   BNP: Invalid input(s): POCBNP CBG:  Recent Labs Lab 10/20/17 0740 10/20/17 1109 10/20/17 1710 10/21/17 0736 10/21/17 1123  GLUCAP 108* 109* 98 123* 125*   D-Dimer No results for input(s): DDIMER in the last 72 hours. Hgb A1c No results for input(s): HGBA1C in the last 72 hours. Lipid Profile No results for input(s): CHOL, HDL, LDLCALC, TRIG, CHOLHDL, LDLDIRECT in the last 72 hours. Thyroid function studies No results for input(s): TSH, T4TOTAL, T3FREE, THYROIDAB in the last 72 hours.  Invalid input(s): FREET3 Anemia work up No results for input(s): VITAMINB12, FOLATE, FERRITIN, TIBC, IRON, RETICCTPCT in the last 72 hours. Urinalysis    Component Value Date/Time   COLORURINE YELLOW 10/20/2017 0459   APPEARANCEUR CLEAR 10/20/2017 0459   LABSPEC 1.012 10/20/2017 0459   PHURINE 6.0 10/20/2017 0459   GLUCOSEU NEGATIVE 10/20/2017 0459   HGBUR NEGATIVE 10/20/2017 [place] 10/20/2017 [place] 10/20/2017 0459   PROTEINUR NEGATIVE 10/20/2017 0459   UROBILINOGEN 0.2 06/17/2015 2028   NITRITE NEGATIVE 10/20/2017 0459   LEUKOCYTESUR NEGATIVE 10/20/2017 0459   Sepsis Labs Invalid input(s): PROCALCITONIN,  WBC,  LACTICIDVEN Microbiology No results found for this or any previous visit (from the past 240 hour(s)).   Time coordinating discharge: Over 30 minutes  SIGNED:   Kathie Dike, MD  [facility] 10/21/2017, 6:47 PM Pager   If 7PM-7AM, please contact night-coverage [email or web address] Password [email or web address]

## 2017-10-21 NOTE — Plan of Care (Signed)
Problem: Education: Goal: Knowledge of  General Education information/materials will improve Outcome: Progressing Discussed general education/information for this hospitalization

## 2017-10-21 NOTE — Progress Notes (Signed)
Patient is to be discharged home and in stable condition. Patient given discharge instructions and verbalized understanding. Patient escorted out by staff via wheelchair.  Celestia Khat, RN

## 2017-10-21 NOTE — Plan of Care (Signed)
Problem: Safety: Goal: Ability to remain free from injury will improve Outcome: Progressing Pt will remain free from injury this hospitalization-reminder for call before you fall; call bell within reach; side rails up x2; bed in low position; hourly rounding for additional safety precautions.

## 2017-10-21 NOTE — Progress Notes (Signed)
Progress Note  Patient Name: Karina Green Date of Encounter: 10/21/2017   Subjective   No chest pain overnight  Inpatient Medications    Scheduled Meds: . aspirin EC  81 mg Oral BID PC  . atorvastatin  80 mg Oral q1800  . enoxaparin (LOVENOX) injection  40 mg Subcutaneous Q24H  . furosemide  20 mg Oral Daily  . glimepiride  1 mg Oral Q breakfast  . insulin aspart  0-9 Units Subcutaneous TID WC  . losartan  12.5 mg Oral Daily  . metoprolol tartrate  12.5 mg Oral BID  . potassium chloride  40 mEq Oral BID   Continuous Infusions:  PRN Meds: acetaminophen, ondansetron **OR** ondansetron (ZOFRAN) IV, ondansetron (ZOFRAN) IV   Vital Signs    Vitals:   10/20/17 1830 10/20/17 2115 10/20/17 2200 10/21/17 0539  BP: (!) 102/42  (!) 125/43 (!) 147/66  Pulse: 62  68 (!) 59  Resp: 20  20 16   Temp: 98.3 F (36.8 C)  98.2 F (36.8 C) 97.6 F (36.4 C)  TempSrc: Oral   Oral  SpO2: 98% 97% 93% 96%  Weight:      Height:        Intake/Output Summary (Last 24 hours) at 10/21/17 1144 Last data filed at 10/20/17 2220  Gross per 24 hour  Intake              100 ml  Output              100 ml  Net                0 ml   Filed Weights   10/20/17 0123 10/20/17 1610  Weight: 224 lb (101.6 kg) 219 lb 6.4 oz (99.5 kg)    Telemetry    NSR  ECG    n/a  Physical Exam   GEN: No acute distress.   Neck: No JVD Cardiac: RRR, 2/6 systolic murmur rusb, rubs, or gallops.  Respiratory: Clear to auscultation bilaterally. GI: Soft, nontender, non-distended  MS: No edema; No deformity. Neuro:  Nonfocal  Psych: Normal affect   Labs    Chemistry Recent Labs Lab 10/20/17 0124 10/20/17 0311 10/20/17 0903  NA 137  --  141  K 2.9*  --  3.5  CL 102  --  108  CO2 23  --  23  GLUCOSE 134*  --  105*  BUN 29*  --  24*  CREATININE 1.13*  --  0.95  CALCIUM 9.0  --  8.7*  PROT  --  7.2  --   ALBUMIN  --  3.8  --   AST  --  23  --   ALT  --  43  --   ALKPHOS  --  84  --     BILITOT  --  0.7  --   GFRNONAA 51*  --  >60  GFRAA 59*  --  >60  ANIONGAP 12  --  10     Hematology Recent Labs Lab 10/20/17 0124 10/20/17 1440  WBC 19.3* 13.1*  RBC 5.19* 4.99  HGB 13.6 12.9  HCT 41.4 40.3  MCV 79.8 80.8  MCH 26.2 25.9*  MCHC 32.9 32.0  RDW 17.3* 17.7*  PLT 255 246    Cardiac Enzymes Recent Labs Lab 10/20/17 0311 10/20/17 0903 10/20/17 1440  TROPONINI <0.03 0.03* <0.03    Recent Labs Lab 10/20/17 0144  TROPIPOC 0.01     BNPNo results for input(s): BNP, PROBNP in  the last 168 hours.   DDimer No results for input(s): DDIMER in the last 168 hours.   Radiology    Nm Myocar Multi W/spect W/wall Motion / Ef  Result Date: 10/21/2017  There was no ST segment deviation noted during stress.  This is a low risk study.  The left ventricular ejection fraction is normal (55-65%).  Small basal inferolateral defect with mild reversibility. This may represent a small prior infarct with mild ischemia, however there is significant adjacent radiotracer uptake which may affect the defect. Either finding would support low risk    Dg Chest Port 1 View  Result Date: 10/20/2017 CLINICAL DATA:  Chest pain EXAM: PORTABLE CHEST 1 VIEW COMPARISON:  Chest radiograph 06/20/2015 Chest CT 06/17/2015 FINDINGS: Cardiomegaly is unchanged. There are median sternotomy wires and a prosthetic valve. There is no focal airspace consolidation or pulmonary edema. No pneumothorax or sizable pleural effusion. IMPRESSION: Mild cardiomegaly without acute cardiopulmonary disease. Electronically Signed   By: Ulyses Jarred M.D.   On: 10/20/2017 02:22    Cardiac Studies    Patient Profile     61 yo female history of CAD with prior CABG in 2015, HTN, HL, mild AS admitted with chest pain   Assessment & Plan    1. CAD/Chest pain - troponin trend not consistent with ACS, chronic ST/T changes on EKG. Echo with normal LVEF and no new WMAs. Nuclear stress test this AM low risk, basal  inferolateral infarct with mild ischemia vs artifact due to gut uptake - overall evidence not consistent with significant new obstructive CAD - continue medical therapy. Currently on ASA 81, atorva 80, losartan 12.5, lopressor 12.5mg  bid. We will try imdur 15mg  daily for additional antianginal effects  Ok for discharge, we will arrange f/u in 2 weeks  For questions or updates, please contact [place] Please consult [email or web address] for contact info under Cardiology/STEMI.      Merrily Pew, MD  10/21/2017, 11:44 AM

## 2017-11-04 NOTE — Progress Notes (Signed)
Cardiology Office Note   Date:  11/05/2017   ID:  Landis, Cassaro [birth year], MRN [id number]  PCP:  [facility]  Cardiologist:  Carlyle Dolly, MD  Chief Complaint  Patient presents with  . Hospitalization Follow-up  . Coronary Artery Disease  . Congestive Heart Failure   History of Present Illness: Karina Green is a 61 y.o. female who presents for post hospitalization follow up after admission for chest pain, with known history of CAD, hypertension, diabetes, and chronic diastolic CHF. She has stress test and echocardiogram.   NM Stress Test 10/21/2017  There was no ST segment deviation noted during stress.  This is a low risk study.  The left ventricular ejection fraction is normal (55-65%).  Small basal inferolateral defect with mild reversibility. This may represent a small prior infarct with mild ischemia, however there is significant adjacent radiotracer uptake which may affect the defect. Either finding would support low risk  Echocardiogram 10/20/2017 Left ventricle: The cavity size was normal. Wall thickness was   increased in a pattern of mild LVH. Systolic function was normal.   The estimated ejection fraction was in the range of 60% to 65%.   Wall motion was normal; there were no regional wall motion   abnormalities. Doppler parameters are consistent with abnormal   left ventricular relaxation (grade 1 diastolic dysfunction). - Aortic valve: There was mild stenosis. Mean gradient (S): 16 mm   Hg. Mean gradient of 19 by pedhoff probe. Valve area (VTI): 1.8   cm^2. Valve area (Vmax): 1.81 cm^2. Valve area (Vmean): 1.66   cm^2. - Mitral valve: Mildly calcified annulus. Normal thickness leaflets  She is here today without any further complaints.  She now believes this was stress related as she was going through a difficult time.  She is feeling much better she is denying any recurrent symtpoms  Past Medical History:  Diagnosis Date  .  Arthritis   . Back pain   . CAD (coronary artery disease), native coronary artery-70% LM stenosis 10/25/2014  . Cough last 30 yrs    nonproductive, no fevers  . DM type 2 (diabetes mellitus, type 2) ([place]) 10/25/14  . Gout   . Hyperlipidemia   . Hypertension     Past Surgical History:  Procedure Laterality Date  . ABDOMINAL HYSTERECTOMY     complete  . APPENDECTOMY    . bladder tack    . CARDIAC CATHETERIZATION  10/25/14   70% LM stensis, normal EF  . CHOLECYSTECTOMY    . CORONARY ARTERY BYPASS GRAFTING (CABG) TIMES TWO USING LEFT INTERNAL MAMMARY ARTERY AND RIGHT LEG GREATER SAPHENOUS VEIN HARVESTED ENDOSCOPICALLY. N/A 10/29/2014   Performed by Ivin Poot, MD at [facility]  . HERNIA REPAIR    . INTRAOPERATIVE TRANSESOPHAGEAL ECHOCARDIOGRAM N/A 10/29/2014   Performed by Prescott Gum, Collier Salina, MD at [facility] Right 2009   hip total  . LEFT HEART CATHETERIZATION WITH CORONARY ANGIOGRAM N/A 10/26/2014   Performed by Troy Sine, MD at [facility] CATH LAB  . TONSILLECTOMY    . TOTAL HIP ARTHROPLASTY    . TOTAL HIP ARTHROPLASTY ANTERIOR APPROACH Left 05/07/2016   Performed by Rod Can, MD at [facility] ORS     Current Outpatient Medications  Medication Sig Dispense Refill  . allopurinol (ZYLOPRIM) 100 MG tablet Take 1 tablet by mouth daily.    Marland Kitchen aspirin 81 MG tablet Take 1 tablet (81 mg total) by mouth 2 (two) times daily  after a meal. (Patient taking differently: Take 81 mg by mouth daily. ) 60 tablet 1  . atorvastatin (LIPITOR) 80 MG tablet TAKE ONE TABLET BY MOUTH ONCE DAILY AT 6 P.M. 90 tablet 01  . colchicine 0.6 MG tablet Take 1 tablet by mouth daily as needed.    . furosemide (LASIX) 20 MG tablet Take 1 tablet (20 mg total) by mouth daily. 90 tablet 1  . glimepiride (AMARYL) 1 MG tablet Take 1 mg by mouth daily with breakfast.    . isosorbide mononitrate (IMDUR) 30 MG 24 hr tablet Take 0.5 tablets (15 mg total) by mouth daily. 30 tablet 0  . losartan (COZAAR) 25 MG  tablet Take 0.5 tablets (12.5 mg total) by mouth daily. 45 tablet 3  . metoprolol tartrate (LOPRESSOR) 25 MG tablet Take 0.5 tablets (12.5 mg total) by mouth 2 (two) times daily. 90 tablet 1  . Multiple Vitamin (MULTIVITAMIN WITH MINERALS) TABS tablet Take 1 tablet by mouth daily.    . potassium chloride SA (K-DUR,KLOR-CON) 20 MEQ tablet Take 1 tablet (20 mEq total) by mouth daily. 30 tablet 0   No current facility-administered medications for this visit.     Allergies:   Patient has no known allergies.    Social History:  The patient  reports that  has never smoked. she has never used smokeless tobacco. She reports that she does not drink alcohol or use drugs.   Family History:  The patient's family history includes CAD in her father; Coronary artery disease in her brother and mother; Hypertension in her father and mother.    ROS: All other systems are reviewed and negative. Unless otherwise mentioned in H&P    PHYSICAL EXAM: VS:  BP 132/70   Pulse 78   Ht _0  (1.6 m)   Wt 216 lb (98 kg)   SpO2 97%   BMI 38.26 kg/m  , BMI Body mass index is 38.26 kg/m. GEN: Well nourished, well developed, in no acute distress  HEENT: normal  Neck: no JVD, carotid bruits, or masses Cardiac: RRR; 1/6 systolic murmurs, rubs, or gallops,no edema  Respiratory:  clear to auscultation bilaterally, normal work of breathing GI: soft, nontender, nondistended, + BS MS: no deformity or atrophy  Skin: warm and dry, no rash Neuro:  Strength and sensation are intact Psych: euthymic mood, full affect  Recent Labs: 10/20/2017: ALT 43; BUN 24; Creatinine, Ser 0.95; Hemoglobin 12.9; Magnesium 1.9; Platelets 246; Potassium 3.5; Sodium 141    Lipid Panel    Component Value Date/Time   CHOL 177 10/25/2014 0407   TRIG 235 (H) 10/25/2014 0407   HDL 30 (L) 10/25/2014 0407   CHOLHDL 5.9 10/25/2014 0407   VLDL 47 (H) 10/25/2014 0407   LDLCALC 100 (H) 10/25/2014 0407      Wt Readings from Last 3  Encounters:  11/05/17 216 lb (98 kg)  10/20/17 219 lb 6.4 oz (99.5 kg)  05/03/17 235 lb (106.6 kg)      ASSESSMENT AND PLAN:  1.  Coronary artery disease: Above stress test and echocardiogram were normal.  No further plans for invasive testing.  She is asymptomatic.  Will be given refills on metoprolol, losartan, she will continue aspirin,  2.  Hypokalemia: She has been placed on potassium replacement.  We will check a be met for status.  3.  Hypertension: Films are provided for metoprolol losartan and potassium.  Labs as above.  She will continue Lasix.   Current medicines are reviewed at length  with the patient today.    Labs/ tests ordered today include: BMET Phill Myron. [place] Pugh, ANP, AACC   11/05/2017 2:41 PM    [facility] [address], [place], [place] [address] Phone: [phone]; Fax: [phone]

## 2017-11-05 ENCOUNTER — Ambulatory Visit: Payer: Medicare HMO | Admitting: Adult Health

## 2017-11-05 ENCOUNTER — Encounter: Payer: Self-pay | Admitting: Adult Health

## 2017-11-05 VITALS — BP 132/70 | HR 78 | Ht 63.0 in | Wt 216.0 lb

## 2017-11-05 DIAGNOSIS — I10 Essential (primary) hypertension: Secondary | ICD-10-CM

## 2017-11-05 DIAGNOSIS — I2510 Atherosclerotic heart disease of native coronary artery without angina pectoris: Secondary | ICD-10-CM | POA: Diagnosis not present

## 2017-11-05 MED ORDER — LOSARTAN POTASSIUM 25 MG PO TABS: 12.5000 mg | ORAL_TABLET | Freq: Every day | ORAL | 3 refills | Status: DC

## 2017-11-05 MED ORDER — POTASSIUM CHLORIDE CRYS ER 20 MEQ PO TBCR
20.0000 meq | EXTENDED_RELEASE_TABLET | Freq: Every day | ORAL | 3 refills | Status: DC
Start: 2017-11-05 — End: 2018-10-15

## 2017-11-05 NOTE — Patient Instructions (Signed)
Medication Instructions:  Your physician recommends that you continue on your current medications as directed. Please refer to the Current Medication list given to you today.   Labwork: Your physician recommends that you return for lab work in: Today    Testing/Procedures: NONE    Follow-Up: Your physician wants you to follow-up in: 6 Months.  You will receive a reminder letter in the mail two months in advance. If you don't receive a letter, please call our office to schedule the follow-up appointment.   Any Other Special Instructions Will Be Listed Below (If Applicable).   Thank you for choosing [facility]!    If you need a refill on your cardiac medications before your next appointment, please call your pharmacy.

## 2017-11-05 NOTE — Addendum Note (Signed)
Addended by: Levonne Hubert on: 11/05/2017 03:09 PM   Modules accepted: Orders

## 2017-11-08 ENCOUNTER — Other Ambulatory Visit (HOSPITAL_COMMUNITY)
Admission: RE | Admit: 2017-11-08 | Discharge: 2017-11-08 | Disposition: A | Discharge status: Home / Self Care | Payer: Medicare HMO | Source: Ambulatory Visit | Attending: Adult Health | Admitting: Adult Health

## 2017-11-08 DIAGNOSIS — E876 Hypokalemia: Secondary | ICD-10-CM | POA: Diagnosis not present

## 2017-11-08 LAB — BASIC METABOLIC PANEL
Anion gap: 8 (ref 5–15)
BUN: 29 mg/dL — AB (ref 6–20)
CO2: 26 mmol/L (ref 22–32)
Calcium: 9.3 mg/dL (ref 8.9–10.3)
Chloride: 105 mmol/L (ref 101–111)
Creatinine, Ser: 1.17 mg/dL — ABNORMAL HIGH (ref 0.44–1.00)
GFR calc Af Amer: 56 mL/min — ABNORMAL LOW (ref >60)
GFR, EST NON AFRICAN AMERICAN: 49 mL/min — AB (ref >60)
GLUCOSE: 83 mg/dL (ref 65–99)
POTASSIUM: 3.2 mmol/L — AB (ref 3.5–5.1)
Sodium: 139 mmol/L (ref 135–145)

## 2017-11-09 ENCOUNTER — Telehealth: Payer: Self-pay | Admitting: *Deleted

## 2017-11-09 DIAGNOSIS — E876 Hypokalemia: Secondary | ICD-10-CM

## 2017-11-09 NOTE — Telephone Encounter (Signed)
-----   Message from Lendon Colonel, NP sent at 11/09/2017  8:13 AM EST ----- Please have her increase her potassium to 40 mEq for two days and then go back to normal dose of potassium 20 mEq daily. Please repeat her BMET and add a magnesium in two days   Thank you.

## 2017-11-16 ENCOUNTER — Other Ambulatory Visit (HOSPITAL_COMMUNITY)
Admission: RE | Admit: 2017-11-16 | Discharge: 2017-11-16 | Disposition: A | Discharge status: Home / Self Care | Payer: Medicare HMO | Source: Ambulatory Visit | Attending: Adult Health | Admitting: Adult Health

## 2017-11-16 DIAGNOSIS — E876 Hypokalemia: Secondary | ICD-10-CM | POA: Insufficient documentation

## 2017-11-16 LAB — BASIC METABOLIC PANEL
Anion gap: 8 (ref 5–15)
BUN: 20 mg/dL (ref 6–20)
CO2: 24 mmol/L (ref 22–32)
CREATININE: 1.19 mg/dL — AB (ref 0.44–1.00)
Calcium: 9.3 mg/dL (ref 8.9–10.3)
Chloride: 110 mmol/L (ref 101–111)
GFR calc Af Amer: 55 mL/min — ABNORMAL LOW (ref >60)
GFR, EST NON AFRICAN AMERICAN: 48 mL/min — AB (ref >60)
GLUCOSE: 119 mg/dL — AB (ref 65–99)
Potassium: 4.3 mmol/L (ref 3.5–5.1)
SODIUM: 142 mmol/L (ref 135–145)

## 2017-11-16 LAB — MAGNESIUM: MAGNESIUM: 1.8 mg/dL (ref 1.7–2.4)

## 2017-12-15 ENCOUNTER — Other Ambulatory Visit: Payer: Self-pay | Admitting: *Deleted

## 2017-12-15 MED ORDER — ISOSORBIDE MONONITRATE ER 30 MG PO TB24: 15.0000 mg | ORAL_TABLET | Freq: Every day | ORAL | 3 refills | Status: DC

## 2017-12-20 DIAGNOSIS — R69 Illness, unspecified: Secondary | ICD-10-CM | POA: Diagnosis not present

## 2017-12-21 DIAGNOSIS — R69 Illness, unspecified: Secondary | ICD-10-CM | POA: Diagnosis not present

## 2018-01-13 DIAGNOSIS — R69 Illness, unspecified: Secondary | ICD-10-CM | POA: Diagnosis not present

## 2018-01-17 DIAGNOSIS — E119 Type 2 diabetes mellitus without complications: Secondary | ICD-10-CM | POA: Diagnosis not present

## 2018-01-17 DIAGNOSIS — Z1389 Encounter for screening for other disorder: Secondary | ICD-10-CM | POA: Diagnosis not present

## 2018-01-17 DIAGNOSIS — E782 Mixed hyperlipidemia: Secondary | ICD-10-CM | POA: Diagnosis not present

## 2018-01-17 DIAGNOSIS — M109 Gout, unspecified: Secondary | ICD-10-CM | POA: Diagnosis not present

## 2018-01-17 DIAGNOSIS — I10 Essential (primary) hypertension: Secondary | ICD-10-CM | POA: Diagnosis not present

## 2018-01-17 DIAGNOSIS — Z6841 Body Mass Index (BMI) 40.0 and over, adult: Secondary | ICD-10-CM | POA: Diagnosis not present

## 2018-01-26 DIAGNOSIS — R69 Illness, unspecified: Secondary | ICD-10-CM | POA: Diagnosis not present

## 2018-02-04 DIAGNOSIS — R69 Illness, unspecified: Secondary | ICD-10-CM | POA: Diagnosis not present

## 2018-03-06 DIAGNOSIS — R69 Illness, unspecified: Secondary | ICD-10-CM | POA: Diagnosis not present

## 2018-04-05 DIAGNOSIS — R69 Illness, unspecified: Secondary | ICD-10-CM | POA: Diagnosis not present

## 2018-04-25 DIAGNOSIS — R69 Illness, unspecified: Secondary | ICD-10-CM | POA: Diagnosis not present

## 2018-04-29 DIAGNOSIS — R69 Illness, unspecified: Secondary | ICD-10-CM | POA: Diagnosis not present

## 2018-07-12 DIAGNOSIS — M79672 Pain in left foot: Secondary | ICD-10-CM | POA: Diagnosis not present

## 2018-07-12 DIAGNOSIS — M19072 Primary osteoarthritis, left ankle and foot: Secondary | ICD-10-CM | POA: Diagnosis not present

## 2018-07-19 DIAGNOSIS — I10 Essential (primary) hypertension: Secondary | ICD-10-CM | POA: Diagnosis not present

## 2018-07-19 DIAGNOSIS — Z0100 Encounter for examination of eyes and vision without abnormal findings: Secondary | ICD-10-CM | POA: Diagnosis not present

## 2018-08-12 DIAGNOSIS — M19072 Primary osteoarthritis, left ankle and foot: Secondary | ICD-10-CM | POA: Diagnosis not present

## 2018-08-12 DIAGNOSIS — M79672 Pain in left foot: Secondary | ICD-10-CM | POA: Diagnosis not present

## 2018-08-30 ENCOUNTER — Encounter (HOSPITAL_COMMUNITY): Payer: Self-pay | Admitting: Physical Therapy

## 2018-08-30 ENCOUNTER — Other Ambulatory Visit: Payer: Self-pay

## 2018-08-30 ENCOUNTER — Ambulatory Visit (HOSPITAL_COMMUNITY): Payer: Medicare HMO | Attending: Sports Medicine | Admitting: Physical Therapy

## 2018-08-30 DIAGNOSIS — M25572 Pain in left ankle and joints of left foot: Secondary | ICD-10-CM | POA: Diagnosis not present

## 2018-08-30 DIAGNOSIS — M25672 Stiffness of left ankle, not elsewhere classified: Secondary | ICD-10-CM

## 2018-08-30 DIAGNOSIS — R262 Difficulty in walking, not elsewhere classified: Secondary | ICD-10-CM

## 2018-08-30 NOTE — Patient Instructions (Addendum)
ROM: Plantar / Dorsiflexion    With left leg relaxed, gently flex and extend ankle. Move through full range of motion. Avoid pain. Repeat ___10_ times per set. Do _1___ sets per session. Do __2__ sessions per day.  http://orth.exer.us/34   Copyright  VHI. All rights reserved.  ROM: Inversion / Eversion    With left leg relaxed, gently turn ankle and foot in and out. Move through full range of motion. Avoid pain. Repeat _10___ times per set. Do __1__ sets per session. Do __2__ sessions per day.  http://orth.exer.us/36   Copyright  VHI. All rights reserved.  Dorsiflexion: Resisted    Facing anchor, tubing around left foot, pull toward face.  Repeat 10____ times per set. Do __1__ sets per session. Do __2__ sessions per day.  http://orth.exer.us/8   Copyright  VHI. All rights reserved.  Plantar Flexion: Resisted    Anchor behind, tubing around left foot, press down. Repeat _10___ times per set. Do _1___ sets per session. Do __2__ sessions per day.  http://orth.exer.us/10   Copyright  VHI. All rights reserved.  Eversion: Resisted    With right foot in tubing loop, hold tubing around other foot to resist and turn foot out. Repeat __10__ times per set. Do _1___ sets per session. Do 2____ sessions per day. 2 http://orth.exer.us/14   Copyright  VHI. All rights reserved.  Inversion: Resisted    Cross legs with right leg underneath, foot in tubing loop. Hold tubing around other foot to resist and turn foot in. Repeat _10___ times per set. Do _1___ sets per session. Do _2___ sessions per day.  http://orth.exer.us/12   Copyright  VHI. All rights reserved.

## 2018-08-30 NOTE — Therapy (Signed)
[facility] [address] [place], [place], [address] Phone: [phone]   Fax:  [phone]  Physical Therapy Evaluation  Patient Details  Name: Karina Green MRN: [id number] Date of Birth: Mar 10, 1956 Referring Provider: Vickki Hearing   Encounter Date: 08/30/2018  PT End of Session - 08/30/18 1524    Visit Number  1    Number of Visits  8    Date for PT Re-Evaluation  09/29/18    PT Start Time  1324    PT Stop Time  1515    PT Time Calculation (min)  40 min    Activity Tolerance  Patient tolerated treatment well    Behavior During Therapy  [facility] for tasks assessed/performed       Past Medical History:  Diagnosis Date  . Arthritis   . Back pain   . CAD (coronary artery disease), native coronary artery-70% LM stenosis 10/25/2014  . Cough last 30 yrs    nonproductive, no fevers  . DM type 2 (diabetes mellitus, type 2) ([place]) 10/25/14  . Gout   . Hyperlipidemia   . Hypertension     Past Surgical History:  Procedure Laterality Date  . ABDOMINAL HYSTERECTOMY     complete  . APPENDECTOMY    . bladder tack    . CARDIAC CATHETERIZATION  10/25/14   70% LM stensis, normal EF  . CHOLECYSTECTOMY    . CORONARY ARTERY BYPASS GRAFT N/A 10/29/2014   Procedure: CORONARY ARTERY BYPASS GRAFTING (CABG) TIMES TWO USING LEFT INTERNAL MAMMARY ARTERY AND RIGHT LEG GREATER SAPHENOUS VEIN HARVESTED ENDOSCOPICALLY.;  Surgeon: Ivin Poot, MD;  Location: [facility];  Service: Open Heart Surgery;  Laterality: N/A;  . HERNIA REPAIR    . INTRAOPERATIVE TRANSESOPHAGEAL ECHOCARDIOGRAM N/A 10/29/2014   Procedure: INTRAOPERATIVE TRANSESOPHAGEAL ECHOCARDIOGRAM;  Surgeon: Ivin Poot, MD;  Location: [facility];  Service: Open Heart Surgery;  Laterality: N/A;  . JOINT REPLACEMENT Right 2009   hip total  . LEFT HEART CATHETERIZATION WITH CORONARY ANGIOGRAM N/A 10/26/2014   Procedure: LEFT HEART CATHETERIZATION WITH CORONARY ANGIOGRAM;  Surgeon: Troy Sine, MD;   Location: [facility] CATH LAB;  Service: Cardiovascular;  Laterality: N/A;  . TONSILLECTOMY    . TOTAL HIP ARTHROPLASTY    . TOTAL HIP ARTHROPLASTY Left 05/07/2016   Procedure: TOTAL HIP ARTHROPLASTY ANTERIOR APPROACH;  Surgeon: Rod Can, MD;  Location: [facility] ORS;  Service: Orthopedics;  Laterality: Left;    There were no vitals filed for this visit.   Subjective Assessment - 08/30/18 1446    Subjective  Ms. Karina Green states that she fell approximately three months ago which started her this current exacerbation of her Left foot pain.  She is having most of her pain on the inside of her forefoot and her ankle.  She has been icing with no imporvement.      Pertinent History  OA, B THR, DM, CAD     Limitations  Standing;House hold activities    How long can you sit comfortably?  no pain or problem     How long can you stand comfortably?  pain after 20 minutes.     How long can you walk comfortably?  increased pain almost immediate but keeps going despite the pain     Patient Stated Goals  less pain,     Currently in Pain?  No/denies   worst in the past week is an 8/10         [facility] PT Assessment -  08/30/18 0001      Assessment   Medical Diagnosis  Lt ankle pain     Referring Provider  Vickki Hearing    Onset Date/Surgical Date  05/30/18    Next MD Visit  09/13/2018    Prior Therapy  none      Precautions   Precautions  None      Restrictions   Weight Bearing Restrictions  No      Balance Screen   Has the patient fallen in the past 6 months  Yes    How many times?  1   granddaughter stepped on foot   Has the patient had a decrease in activity level because of a fear of falling?   No    Is the patient reluctant to leave their home because of a fear of falling?   No      Home Environment   Living Environment  Private residence    Home Access  Stairs to enter    Additional Comments  goes one step at a time since hip surgery.      Prior Function   Level of Independence  Independent     Vocation  Retired    Leisure  read and Engineer, agricultural   Overall Cognitive Status  Within Functional Limits for tasks assessed      Observation/Other Assessments   Focus on Therapeutic Outcomes (FOTO)   44      Observation/Other Assessments-Edema    Edema  Figure 8      Figure 8 Edema   Figure 8 - Right   54.5    Figure 8 - Left   56      Functional Tests   Functional tests  Single leg stance;Sit to Stand      Single Leg Stance   Comments  RT:  7 seconds : LT 3       Sit to Stand   Comments  5x 16.85      ROM / Strength   AROM / PROM / Strength  AROM;Strength      AROM   AROM Assessment Site  Ankle    Right/Left Ankle  Right;Left    Right Ankle Dorsiflexion  12    Right Ankle Plantar Flexion  60    Right Ankle Inversion  25    Right Ankle Eversion  15    Left Ankle Dorsiflexion  3    Left Ankle Plantar Flexion  60    Left Ankle Inversion  18    Left Ankle Eversion  12      Strength   Strength Assessment Site  Ankle    Right/Left Ankle  Right;Left    Right Ankle Dorsiflexion  5/5    Right Ankle Plantar Flexion  5/5    Right Ankle Inversion  5/5    Right Ankle Eversion  5/5    Left Ankle Dorsiflexion  4-/5    Left Ankle Plantar Flexion  3-/5    Left Ankle Inversion  4-/5    Left Ankle Eversion  4+/5                Objective measurements completed on examination: See above findings.      [facility] Adult PT Treatment/Exercise - 08/30/18 0001      Exercises   Exercises  Ankle      Ankle Exercises: Supine   T-Band  green all x 10     Other Supine Ankle Exercises  ROM             PT Education - 08/30/18 1523    Education Details  HEP    Person(s) Educated  Patient    Methods  Explanation;Handout;Verbal cues;Demonstration    Comprehension  Verbalized understanding;Returned demonstration       PT Short Term Goals - 08/30/18 1531      PT SHORT TERM GOAL #1   Title  Pt ROM for dorsiflexion and plantarflexion on left ankle to improve  by 5 degrees to allow pt to have a more normalized heel toe gait,    Time  2    Period  Weeks    Status  New    Target Date  09/13/18      PT SHORT TERM GOAL #2   Title  Pt strength in her left ankle to improve by 1/2 grade to allow pt to be able to stand for 40 minutes without having increased pain to be able to cook without increased pain.     Time  2    Period  Weeks    Status  New      PT SHORT TERM GOAL #3   Title  Pt to be able to single leg stance on both LE for at least 15 seconds to reduce risk of falling.     Time  2    Period  Weeks    Status  New        PT Long Term Goals - 08/30/18 1533      PT LONG TERM GOAL #1   Title  Pt pain in her left foot to be no greater than a 2/10 to allow pt to walk for over 30 mintue painfre for grocery shopping and household duties.     Time  4    Period  Weeks    Status  New    Target Date  09/27/18      PT LONG TERM GOAL #2   Title  Pt swelling in her left ankle to have decreased by 1 cm to allow shoes to be more comfortable.     Time  4    Period  Weeks    Status  New      PT LONG TERM GOAL #3   Title  Pt to be I in an advanced HEP to continue to work on strengthening and balance at home until pt is at previous functioning level    Time  4    Period  [place] - 08/30/18 1525    Clinical Impression Statement  Ms. Devargas is a 62 yo female who was walking when her granddaughter caught the back of her flip flop and caused her to lose her balance and fall.  She has had increased pain in her left ankle and foot ever since with immediate pain upon weightbearing.  She currently has been referred to skilled physical therapy by her orthopedic physician.  Evaluation demonstrates increased pain, decreased ROM, decreased strengh and decreased balance.  Ms. Beretta will benefit from skilled physical therapy to address these issues and maximize her functional ability.     History and Personal Factors relevant  to plan of care:  OA, B THR     Clinical Presentation  Stable    Clinical Decision Making  Moderate    Rehab Potential  Good    PT Frequency  2x / week    PT Duration  4 weeks    PT Treatment/Interventions  ADLs/Self Care Home Management;Patient/family education;Balance training;Neuromuscular re-education;Therapeutic exercise;Therapeutic activities;Gait training;Stair training;Functional mobility training;Passive range of motion;Manual techniques;Iontophoresis 4mg /ml Dexamethasone;Dry needling    PT Next Visit Plan  Begin sitting BAPS, ankle alphabet, towel crunch towel push away and to exercises, manual to reduce edema in LE and ankle; may use ionto if pain is at a high level.  Progress to B weight bearing actiivity then single leg as pain allows.     PT Home Exercise Plan  ROM and green tband exercises.     Consulted and Agree with Plan of Care  Patient       Patient will benefit from skilled therapeutic intervention in order to improve the following deficits and impairments:  Abnormal gait, Decreased activity tolerance, Decreased balance, Decreased range of motion, Decreased strength, Difficulty walking, Pain  Visit Diagnosis: Stiffness of left ankle, not elsewhere classified  Pain in left ankle and joints of left foot  Difficulty in walking, not elsewhere classified     Problem List Patient Active Problem List   Diagnosis Date Noted  . Chest pain 10/20/2017  . Leukocytosis 10/20/2017  . Primary osteoarthritis of left hip 05/07/2016  . Chronic diastolic heart failure ([place]) 06/20/2015  . Aortic valve stenosis, mild 06/20/2015  . CKD (chronic kidney disease), stage III ([place]) 06/20/2015  . Hypokalemia 06/20/2015  . Sepsis ([place]) 06/20/2015  . Acute respiratory failure with hypoxia ([place]) 06/20/2015  . CAP (community acquired pneumonia) 06/17/2015  . CAD (coronary artery disease), native coronary artery-70% LM stenosis 10/26/2014  . DM2 (diabetes mellitus, type 2) ([place]) 10/26/2014   . Unstable angina ([place]) 10/25/2014  . Essential hypertension 10/24/2014  . Hypercholesteremia 10/24/2014    Rayetta Humphrey, PT CLT [phone] 08/30/2018, 3:37 PM  [facility] [address] [place], [place], [address] Phone: [phone]   Fax:  [phone]  Name: Karina Green MRN: [id number] Date of Birth: [birth year]

## 2018-08-31 ENCOUNTER — Encounter (HOSPITAL_COMMUNITY): Payer: Self-pay | Admitting: Physical Therapy

## 2018-08-31 ENCOUNTER — Ambulatory Visit (HOSPITAL_COMMUNITY): Payer: Medicare HMO | Admitting: Physical Therapy

## 2018-08-31 DIAGNOSIS — M25672 Stiffness of left ankle, not elsewhere classified: Secondary | ICD-10-CM | POA: Diagnosis not present

## 2018-08-31 DIAGNOSIS — R262 Difficulty in walking, not elsewhere classified: Secondary | ICD-10-CM | POA: Diagnosis not present

## 2018-08-31 DIAGNOSIS — M25572 Pain in left ankle and joints of left foot: Secondary | ICD-10-CM | POA: Diagnosis not present

## 2018-08-31 NOTE — Therapy (Signed)
[place], [place], [address] Phone: [phone]   Fax:  [phone]  Physical Therapy Treatment  Patient Details  Name: Karina Green MRN: [id number] Date of Birth: February 04, 1956 Referring Provider: Vickki Hearing   Encounter Date: 08/31/2018  PT End of Session - 08/31/18 1713    Visit Number  2    Number of Visits  8    Date for PT Re-Evaluation  09/29/18    PT Start Time  9326   Patient arrived late   PT Stop Time  1730    PT Time Calculation (min)  32 min    Activity Tolerance  Patient tolerated treatment well    Behavior During Therapy  [facility] for tasks assessed/performed       Past Medical History:  Diagnosis Date  . Arthritis   . Back pain   . CAD (coronary artery disease), native coronary artery-70% LM stenosis 10/25/2014  . Cough last 30 yrs    nonproductive, no fevers  . DM type 2 (diabetes mellitus, type 2) ([place]) 10/25/14  . Gout   . Hyperlipidemia   . Hypertension     Past Surgical History:  Procedure Laterality Date  . ABDOMINAL HYSTERECTOMY     complete  . APPENDECTOMY    . bladder tack    . CARDIAC CATHETERIZATION  10/25/14   70% LM stensis, normal EF  . CHOLECYSTECTOMY    . CORONARY ARTERY BYPASS GRAFT N/A 10/29/2014   Procedure: CORONARY ARTERY BYPASS GRAFTING (CABG) TIMES TWO USING LEFT INTERNAL MAMMARY ARTERY AND RIGHT LEG GREATER SAPHENOUS VEIN HARVESTED ENDOSCOPICALLY.;  Surgeon: Ivin Poot, MD;  Location: [place];  Service: Open Heart Surgery;  Laterality: N/A;  . HERNIA REPAIR    . INTRAOPERATIVE TRANSESOPHAGEAL ECHOCARDIOGRAM N/A 10/29/2014   Procedure: INTRAOPERATIVE TRANSESOPHAGEAL ECHOCARDIOGRAM;  Surgeon: Ivin Poot, MD;  Location: [place];  Service: Open Heart Surgery;  Laterality: N/A;  . JOINT REPLACEMENT Right 2009   hip total  . LEFT HEART CATHETERIZATION WITH CORONARY ANGIOGRAM N/A 10/26/2014   Procedure: LEFT HEART CATHETERIZATION WITH CORONARY ANGIOGRAM;  Surgeon:  Troy Sine, MD;  Location: [facility] C[facility] LAB;  Service: Cardiovascular;  Laterality: N/A;  . TONSILLECTOMY    . TOTAL HIP ARTHROPLASTY    . TOTAL HIP ARTHROPLASTY Left 05/07/2016   Procedure: TOTAL HIP ARTHROPLASTY ANTERIOR APPROACH;  Surgeon: Rod Can, MD;  Location[facility];  Service: Orthopedics;  Laterality: Left;    There were no vitals filed for this visit.  Subjective Assessment - 08/31/18 1702    Subjective  Patient stated she tried to perform HEP yesterday after her evaluation. She denied pain, but reported some soreness.     Pertinent History  OA, B THR, DM, CAD     Limitations  Standing;House hold activities    How long can you sit comfortably?  no pain or problem     How long can you stand comfortably?  pain after 20 minutes.     How long can you walk comfortably?  increased pain almost immediate but keeps going despite the pain     Patient Stated Goals  less pain,     Currently in Pain?  No/denies                       [facility] Adult PT Treatment/Exercise - 08/31/18 0001      Manual Therapy   Manual Therapy  Edema management    Manual therapy  comments  Completed separate from all other skilled interventions    Edema Management  Patient supine with both legs elevated retro massage to decrease edema in left foot and ankle      Ankle Exercises: Seated   ABC's  1 rep   Left   Towel Crunch  Limitations   10 repetitions   BAPS  Level 2;Limitations    BAPS Limitations  x10 left/right, forward/back, CW/CCW             PT Education - 08/31/18 1713    Education Details  Discussed purpose and technique of interventions throughout session.     Person(s) Educated  Patient    Methods  Explanation    Comprehension  Verbalized understanding       PT Short Term Goals - 08/31/18 1715      PT SHORT TERM GOAL #1   Title  Pt ROM for dorsiflexion and plantarflexion on left ankle to improve by 5 degrees to allow pt to have a more normalized heel toe gait,     Time  2    Period  Weeks    Status  On-going      PT SHORT TERM GOAL #2   Title  Pt strength in her left ankle to improve by 1/2 grade to allow pt to be able to stand for 40 minutes without having increased pain to be able to cook without increased pain.     Time  2    Period  Weeks    Status  On-going      PT SHORT TERM GOAL #3   Title  Pt to be able to single leg stance on both LE for at least 15 seconds to reduce risk of falling.     Time  2    Period  Weeks    Status  On-going        PT Long Term Goals - 08/31/18 1716      PT LONG TERM GOAL #1   Title  Pt pain in her left foot to be no greater than a 2/10 to allow pt to walk for over 30 mintue painfre for grocery shopping and household duties.     Time  4    Period  Weeks    Status  On-going      PT LONG TERM GOAL #2   Title  Pt swelling in her left ankle to have decreased by 1 cm to allow shoes to be more comfortable.     Time  4    Period  Weeks    Status  On-going      PT LONG TERM GOAL #3   Title  Pt to be I in an advanced HEP to continue to work on strengthening and balance at home until pt is at previous functioning level    Time  4    Period  Weeks    Status  On-going            Plan - 08/31/18 1715    Clinical Impression Statement  Patient arrived 10 minutes late and therefore session was limited by this. Began session by reviewing patient's evaluation and goals. Then progressed to ankle mobility exercises including left ankle ABC's, BAPS board seated, and towel crunches. Then session progressed to performing manual therapy to reduce edema in patient's left ankle. Plan to continue with ankle mobility exercises next session and to progress to standing with mobility exercises as able.     Rehab Potential  Good    PT Frequency  2x / week    PT Duration  4 weeks    PT Treatment/Interventions  ADLs/Self Care Home Management;Patient/family education;Balance training;Neuromuscular re-education;Therapeutic  exercise;Therapeutic activities;Gait training;Stair training;Functional mobility training;Passive range of motion;Manual techniques;Iontophoresis 4mg /ml Dexamethasone;Dry needling    PT Next Visit Plan  Continue sitting BAPS, ankle alphabet, towel crunch towel push away and to exercises, manual to reduce edema in LE and ankle; may use ionto if pain is at a high level.  Progress to B weight bearing actiivity then single leg as pain allows.     PT Home Exercise Plan  ROM and green tband exercises.     Consulted and Agree with Plan of Care  Patient       Patient will benefit from skilled therapeutic intervention in order to improve the following deficits and impairments:  Abnormal gait, Decreased activity tolerance, Decreased balance, Decreased range of motion, Decreased strength, Difficulty walking, Pain  Visit Diagnosis: Stiffness of left ankle, not elsewhere classified  Pain in left ankle and joints of left foot  Difficulty in walking, not elsewhere classified     Problem List Patient Active Problem List   Diagnosis Date Noted  . Chest pain 10/20/2017  . Leukocytosis 10/20/2017  . Primary osteoarthritis of left hip 05/07/2016  . Chronic diastolic heart failure ([place]) 06/20/2015  . Aortic valve stenosis, mild 06/20/2015  . CKD (chronic kidney disease), stage III ([place]) 06/20/2015  . Hypokalemia 06/20/2015  . Sepsis ([place]) 06/20/2015  . Acute respiratory failure with hypoxia ([place]) 06/20/2015  . CAP (community acquired pneumonia) 06/17/2015  . CAD (coronary artery disease), native coronary artery-70% LM stenosis 10/26/2014  . DM2 (diabetes mellitus, type 2) ([place]) 10/26/2014  . Unstable angina ([place]) 10/25/2014  . Essential hypertension 10/24/2014  . Hypercholesteremia 10/24/2014   Clarene Critchley PT, DPT 5:36 PM, 08/31/18 [place], [place], [address] Phone: [phone]   Fax:  [phone]  Name: Karina Green MRN: [id number] Date of Birth: 09-25-1956

## 2018-09-07 ENCOUNTER — Encounter (HOSPITAL_COMMUNITY): Payer: Self-pay

## 2018-09-07 ENCOUNTER — Ambulatory Visit (HOSPITAL_COMMUNITY): Payer: Medicare HMO

## 2018-09-07 DIAGNOSIS — R262 Difficulty in walking, not elsewhere classified: Secondary | ICD-10-CM

## 2018-09-07 DIAGNOSIS — M25672 Stiffness of left ankle, not elsewhere classified: Secondary | ICD-10-CM

## 2018-09-07 DIAGNOSIS — M25572 Pain in left ankle and joints of left foot: Secondary | ICD-10-CM

## 2018-09-07 NOTE — Therapy (Signed)
[place], [place], [address] Phone: [phone]   Fax:  [phone]  Physical Therapy Treatment  Patient Details  Name: Karina Green MRN: [id number] Date of Birth: 1956-06-01 Referring Provider: Vickki Hearing   Encounter Date: 09/07/2018  PT End of Session - 09/07/18 1726    Visit Number  3    Number of Visits  8    Date for PT Re-Evaluation  09/29/18    Authorization Type  [employer]/Medicare    Authorization Time Period  Cert: 5/63-->89/37/34    Authorization - Visit Number  3    Authorization - Number of Visits  8    PT Start Time  2876    PT Stop Time  1728    PT Time Calculation (min)  42 min    Activity Tolerance  Patient limited by pain;Patient tolerated treatment well;No increased pain   Pain reduced to 4/10, was 7/10 initially   Behavior During Therapy  [facility] for tasks assessed/performed       Past Medical History:  Diagnosis Date  . Arthritis   . Back pain   . CAD (coronary artery disease), native coronary artery-70% LM stenosis 10/25/2014  . Cough last 30 yrs    nonproductive, no fevers  . DM type 2 (diabetes mellitus, type 2) ([place]) 10/25/14  . Gout   . Hyperlipidemia   . Hypertension     Past Surgical History:  Procedure Laterality Date  . ABDOMINAL HYSTERECTOMY     complete  . APPENDECTOMY    . bladder tack    . CARDIAC CATHETERIZATION  10/25/14   70% LM stensis, normal EF  . CHOLECYSTECTOMY    . CORONARY ARTERY BYPASS GRAFT N/A 10/29/2014   Procedure: CORONARY ARTERY BYPASS GRAFTING (CABG) TIMES TWO USING LEFT INTERNAL MAMMARY ARTERY AND RIGHT LEG GREATER SAPHENOUS VEIN HARVESTED ENDOSCOPICALLY.;  Surgeon: Ivin Poot, MD;  Location: [place];  Service: Open Heart Surgery;  Laterality: N/A;  . HERNIA REPAIR    . INTRAOPERATIVE TRANSESOPHAGEAL ECHOCARDIOGRAM N/A 10/29/2014   Procedure: INTRAOPERATIVE TRANSESOPHAGEAL ECHOCARDIOGRAM;  Surgeon: Ivin Poot, MD;  Location: [place];  Service:  Open Heart Surgery;  Laterality: N/A;  . JOINT REPLACEMENT Right 2009   hip total  . LEFT HEART CATHETERIZATION WITH CORONARY ANGIOGRAM N/A 10/26/2014   Procedure: LEFT HEART CATHETERIZATION WITH CORONARY ANGIOGRAM;  Surgeon: Troy Sine, MD;  [facility];  Service: Cardiovascular;  Laterality: N/A;  . TONSILLECTOMY    . TOTAL HIP ARTHROPLASTY    . TOTAL HIP ARTHROPLASTY Left 05/07/2016   Procedure: TOTAL HIP ARTHROPLASTY ANTERIOR APPROACH;  Surgeon: Rod Can, MD;  [facility];  Service: Orthopedics;  Laterality: Left;    There were no vitals filed for this visit.  Subjective Assessment - 09/07/18 1653    Subjective  Pt stated she has increased pain today, especially with weight bearing.  Pain scale 7/10 achey pain.      Pertinent History  OA, B THR, DM, CAD     Patient Stated Goals  less pain,     Currently in Pain?  Yes    Pain Score  7     Pain Location  Ankle    Pain Orientation  Medial    Pain Descriptors / Indicators  Aching    Pain Onset  More than a month ago    Pain Frequency  Intermittent    Aggravating Factors   weight bearing    Pain Relieving  Factors  unsure    Effect of Pain on Daily Activities  limits                        [facility] Adult PT Treatment/Exercise - 09/07/18 0001      Manual Therapy   Manual Therapy  Edema management    Manual therapy comments  Completed separate from all other skilled interventions    Edema Management  Patient supine with both legs elevated retro massage to decrease edema in left foot and ankle      Ankle Exercises: Seated   Ankle Circles/Pumps  10 reps    Towel Crunch  1 rep    Towel Inversion/Eversion  3 reps    Heel Raises  10 reps    Toe Raise  10 reps    BAPS  Level 2;Limitations    BAPS Limitations  x10 left/right, forward/back, CW/CCW               PT Short Term Goals - 08/31/18 1715      PT SHORT TERM GOAL #1   Title  Pt ROM for dorsiflexion and plantarflexion on left  ankle to improve by 5 degrees to allow pt to have a more normalized heel toe gait,    Time  2    Period  Weeks    Status  On-going      PT SHORT TERM GOAL #2   Title  Pt strength in her left ankle to improve by 1/2 grade to allow pt to be able to stand for 40 minutes without having increased pain to be able to cook without increased pain.     Time  2    Period  Weeks    Status  On-going      PT SHORT TERM GOAL #3   Title  Pt to be able to single leg stance on both LE for at least 15 seconds to reduce risk of falling.     Time  2    Period  Weeks    Status  On-going        PT Long Term Goals - 08/31/18 1716      PT LONG TERM GOAL #1   Title  Pt pain in her left foot to be no greater than a 2/10 to allow pt to walk for over 30 mintue painfre for grocery shopping and household duties.     Time  4    Period  Weeks    Status  On-going      PT LONG TERM GOAL #2   Title  Pt swelling in her left ankle to have decreased by 1 cm to allow shoes to be more comfortable.     Time  4    Period  Weeks    Status  On-going      PT LONG TERM GOAL #3   Title  Pt to be I in an advanced HEP to continue to work on strengthening and balance at home until pt is at previous functioning level    Time  4    Period  Weeks    Status  On-going            Plan - 09/07/18 1727    Clinical Impression Statement  Pt arrived with reports of increased pain and edema present proximal ankle today.  Held standing exercises due to pain with weight bearing.  Session focus on ankle mobility, intrinsic and ankle musculature strengthening and  manual technqiues to assist with edema and pain control.  Pt able to complete proper form with all exercises with minimal cueing to reduce compensation of knee with BAPS board.  Reports pain reduced to 4/10 at EOS (was 7/10 to begin with.)  If pain scale if high next session, consider ionto for pain control.      Rehab Potential  Good    PT Frequency  2x / week    PT  Duration  4 weeks    PT Treatment/Interventions  ADLs/Self Care Home Management;Patient/family education;Balance training;Neuromuscular re-education;Therapeutic exercise;Therapeutic activities;Gait training;Stair training;Functional mobility training;Passive range of motion;Manual techniques;Iontophoresis 4mg /ml Dexamethasone;Dry needling    PT Next Visit Plan  Continue sitting BAPS, ankle alphabet, towel crunch towel push away and to exercises, manual to reduce edema in LE and ankle; may use ionto if pain is at a high level.  Progress to B weight bearing actiivity then single leg as pain allows.        Patient will benefit from skilled therapeutic intervention in order to improve the following deficits and impairments:  Abnormal gait, Decreased activity tolerance, Decreased balance, Decreased range of motion, Decreased strength, Difficulty walking, Pain  Visit Diagnosis: Stiffness of left ankle, not elsewhere classified  Pain in left ankle and joints of left foot  Difficulty in walking, not elsewhere classified     Problem List Patient Active Problem List   Diagnosis Date Noted  . Chest pain 10/20/2017  . Leukocytosis 10/20/2017  . Primary osteoarthritis of left hip 05/07/2016  . Chronic diastolic heart failure ([place]) 06/20/2015  . Aortic valve stenosis, mild 06/20/2015  . CKD (chronic kidney disease), stage III ([place]) 06/20/2015  . Hypokalemia 06/20/2015  . Sepsis ([place]) 06/20/2015  . Acute respiratory failure with hypoxia ([place]) 06/20/2015  . CAP (community acquired pneumonia) 06/17/2015  . CAD (coronary artery disease), native coronary artery-70% LM stenosis 10/26/2014  . DM2 (diabetes mellitus, type 2) ([place]) 10/26/2014  . Unstable angina ([place]) 10/25/2014  . Essential hypertension 10/24/2014  . Hypercholesteremia 10/24/2014   Ihor Austin, [place]; [place]  Aldona Lento 09/07/2018, 6:28 PM  [place] [address] [place], [place], [address] Phone: [phone]   Fax:  [phone]  Name: Karina Green MRN: [id number] Date of Birth: May 24, 1956

## 2018-09-08 ENCOUNTER — Ambulatory Visit (HOSPITAL_COMMUNITY): Payer: Medicare HMO

## 2018-09-08 ENCOUNTER — Telehealth (HOSPITAL_COMMUNITY): Payer: Self-pay

## 2018-09-08 NOTE — Telephone Encounter (Signed)
She is sick on the stomach and can not come in today

## 2018-09-14 ENCOUNTER — Telehealth (HOSPITAL_COMMUNITY): Payer: Self-pay | Admitting: Physical Therapy

## 2018-09-14 ENCOUNTER — Ambulatory Visit (HOSPITAL_COMMUNITY): Payer: Medicare HMO | Admitting: Physical Therapy

## 2018-09-14 NOTE — Telephone Encounter (Signed)
Therapist called regarding patient not showing up for appointment scheduled at 4:45 this afternoon. Patient stated that she had forgotten the appointment. Therapist reminded patient of her next scheduled appointment.   Clarene Critchley PT, DPT 5:17 PM, 09/14/18 [phone]

## 2018-09-16 ENCOUNTER — Telehealth (HOSPITAL_COMMUNITY): Payer: Self-pay

## 2018-09-16 ENCOUNTER — Encounter (HOSPITAL_COMMUNITY): Payer: Self-pay | Admitting: Physical Therapy

## 2018-09-16 ENCOUNTER — Ambulatory Visit (HOSPITAL_COMMUNITY): Payer: Medicare HMO

## 2018-09-16 NOTE — Therapy (Signed)
[place] [address] [place], [place], [address] Phone: [phone]   Fax:  [phone]  Patient Details  Name: Karina Green MRN: [id number] Date of Birth: 1956-06-25 Referring Provider:  Jaynee Eagles Encounter Date: 09/16/2018    PHYSICAL THERAPY DISCHARGE SUMMARY  Visits from Start of Care: 3  Current functional level related to goals / functional outcomes: Unknown as pt did not return    Remaining deficits: Assume all    Education / Equipment: For strengthening/ ROM Plan: Patient agrees to discharge.  Patient goals were not met. Patient is being discharged due to the patient's request.  ?????    PT called and stated that she was not returning. Karina Green, PT CLT [phone]   Karina Green, PT CLT [phone] 09/16/2018, 3:22 PM  [place] [address] [place], [place], [address] Phone: [phone]   Fax:  [phone]

## 2018-09-16 NOTE — Telephone Encounter (Signed)
09/16/18  pt called and cx remaining appts... she said that she wanted to be discharged she wasn't coming back.

## 2018-09-20 ENCOUNTER — Encounter (HOSPITAL_COMMUNITY): Payer: Medicare HMO | Admitting: Physical Therapy

## 2018-09-22 ENCOUNTER — Encounter (HOSPITAL_COMMUNITY): Payer: Medicare HMO | Admitting: Physical Therapy

## 2018-09-23 ENCOUNTER — Encounter (HOSPITAL_COMMUNITY): Payer: Self-pay

## 2018-10-14 DIAGNOSIS — Z1389 Encounter for screening for other disorder: Secondary | ICD-10-CM | POA: Diagnosis not present

## 2018-10-14 DIAGNOSIS — M1612 Unilateral primary osteoarthritis, left hip: Secondary | ICD-10-CM | POA: Diagnosis not present

## 2018-10-14 DIAGNOSIS — E782 Mixed hyperlipidemia: Secondary | ICD-10-CM | POA: Diagnosis not present

## 2018-10-14 DIAGNOSIS — I2510 Atherosclerotic heart disease of native coronary artery without angina pectoris: Secondary | ICD-10-CM | POA: Diagnosis not present

## 2018-10-14 DIAGNOSIS — I10 Essential (primary) hypertension: Secondary | ICD-10-CM | POA: Diagnosis not present

## 2018-10-14 DIAGNOSIS — R69 Illness, unspecified: Secondary | ICD-10-CM | POA: Diagnosis not present

## 2018-10-14 DIAGNOSIS — E119 Type 2 diabetes mellitus without complications: Secondary | ICD-10-CM | POA: Diagnosis not present

## 2018-10-14 DIAGNOSIS — I5032 Chronic diastolic (congestive) heart failure: Secondary | ICD-10-CM | POA: Diagnosis not present

## 2018-10-14 DIAGNOSIS — M109 Gout, unspecified: Secondary | ICD-10-CM | POA: Diagnosis not present

## 2018-10-14 DIAGNOSIS — Z23 Encounter for immunization: Secondary | ICD-10-CM | POA: Diagnosis not present

## 2018-10-14 DIAGNOSIS — Z6841 Body Mass Index (BMI) 40.0 and over, adult: Secondary | ICD-10-CM | POA: Diagnosis not present

## 2018-10-15 ENCOUNTER — Other Ambulatory Visit: Payer: Self-pay | Admitting: Adult Health

## 2018-11-28 DIAGNOSIS — Z6841 Body Mass Index (BMI) 40.0 and over, adult: Secondary | ICD-10-CM | POA: Diagnosis not present

## 2018-11-28 DIAGNOSIS — J069 Acute upper respiratory infection, unspecified: Secondary | ICD-10-CM | POA: Diagnosis not present

## 2018-11-28 DIAGNOSIS — Z0001 Encounter for general adult medical examination with abnormal findings: Secondary | ICD-10-CM | POA: Diagnosis not present

## 2019-02-15 ENCOUNTER — Other Ambulatory Visit (HOSPITAL_COMMUNITY): Payer: Self-pay | Admitting: Family Medicine

## 2019-02-15 DIAGNOSIS — Z1231 Encounter for screening mammogram for malignant neoplasm of breast: Secondary | ICD-10-CM

## 2019-02-17 ENCOUNTER — Encounter: Payer: Self-pay | Admitting: *Deleted

## 2019-02-17 ENCOUNTER — Encounter: Payer: Self-pay | Admitting: Cardiology

## 2019-02-17 ENCOUNTER — Ambulatory Visit: Payer: Medicare HMO | Admitting: Cardiology

## 2019-02-17 VITALS — BP 130/74 | HR 70 | Ht 63.0 in | Wt 234.0 lb

## 2019-02-17 DIAGNOSIS — I10 Essential (primary) hypertension: Secondary | ICD-10-CM | POA: Diagnosis not present

## 2019-02-17 DIAGNOSIS — E782 Mixed hyperlipidemia: Secondary | ICD-10-CM | POA: Diagnosis not present

## 2019-02-17 DIAGNOSIS — I2510 Atherosclerotic heart disease of native coronary artery without angina pectoris: Secondary | ICD-10-CM | POA: Diagnosis not present

## 2019-02-17 DIAGNOSIS — I350 Nonrheumatic aortic (valve) stenosis: Secondary | ICD-10-CM

## 2019-02-17 NOTE — Patient Instructions (Signed)
Medication Instructions:  Continue all current medications.   Follow-Up: Your physician wants you to follow up in:  1 year.  You will receive a reminder letter in the mail one-two months in advance.  If you don't receive a letter, please call our office to schedule the follow up appointment    Any Other Special Instructions Will Be Listed Below (If Applicable).  If you need a refill on your cardiac medications before your next appointment, please call your pharmacy.

## 2019-02-17 NOTE — Progress Notes (Signed)
Clinical Summary Ms. Napierala is a 63 y.o.female seen for the following medical problems.  1. CAD - hx of CABG Nov 2015 (LIMA-LAD,SVG-OM) - 10/2014 echo LVEF 60-65%, abnormal diastolic function indeterminant grade,    09/2017 echo LVEF 60-65%, no WMAs, grade I diastolic dysfunction, mild AS 10/2017 nuclear stress: low risk, gut artifact vs small basal inferolateral defect with mild reversibility   - no recent chest pain. No SOB or DOE - compliant with meds  2. HTN - compliant with meds   3. Hyperlipidemia - compliant with statin, labs followed by pcp  4. Aortic stenosis  - 09/2017 echo mean grad 16, AVA VTI 1.66 consistent with mild AS - no symptoms Past Medical History:  Diagnosis Date  . Arthritis   . Back pain   . CAD (coronary artery disease), native coronary artery-70% LM stenosis 10/25/2014  . Cough last 30 yrs    nonproductive, no fevers  . DM type 2 (diabetes mellitus, type 2) ([place]) 10/25/14  . Gout   . Hyperlipidemia   . Hypertension      No Known Allergies   Current Outpatient Medications  Medication Sig Dispense Refill  . allopurinol (ZYLOPRIM) 100 MG tablet Take 1 tablet by mouth daily.    Marland Kitchen aspirin 81 MG tablet Take 1 tablet (81 mg total) by mouth 2 (two) times daily after a meal. (Patient taking differently: Take 81 mg by mouth daily. ) 60 tablet 1  . atorvastatin (LIPITOR) 80 MG tablet TAKE ONE TABLET BY MOUTH ONCE DAILY AT 6 P.M. 90 tablet 01  . colchicine 0.6 MG tablet Take 1 tablet by mouth daily as needed.    . furosemide (LASIX) 20 MG tablet Take 1 tablet (20 mg total) by mouth daily. 90 tablet 1  . glimepiride (AMARYL) 1 MG tablet Take 1 mg by mouth daily with breakfast.    . isosorbide mononitrate (IMDUR) 30 MG 24 hr tablet Take 0.5 tablets (15 mg total) by mouth daily. 45 tablet 3  . losartan (COZAAR) 25 MG tablet Take 0.5 tablets (12.5 mg total) daily by mouth. 45 tablet 3  . metoprolol tartrate (LOPRESSOR) 25 MG tablet Take 0.5  tablets (12.5 mg total) by mouth 2 (two) times daily. 90 tablet 1  . Multiple Vitamin (MULTIVITAMIN WITH MINERALS) TABS tablet Take 1 tablet by mouth daily.    . potassium chloride SA (K-DUR,KLOR-CON) 20 MEQ tablet Take 1 tablet (20 mEq total) by mouth daily. NEED OV. 90 tablet 0   No current facility-administered medications for this visit.      Past Surgical History:  Procedure Laterality Date  . ABDOMINAL HYSTERECTOMY     complete  . APPENDECTOMY    . bladder tack    . CARDIAC CATHETERIZATION  10/25/14   70% LM stensis, normal EF  . CHOLECYSTECTOMY    . CORONARY ARTERY BYPASS GRAFT N/A 10/29/2014   Procedure: CORONARY ARTERY BYPASS GRAFTING (CABG) TIMES TWO USING LEFT INTERNAL MAMMARY ARTERY AND RIGHT LEG GREATER SAPHENOUS VEIN HARVESTED ENDOSCOPICALLY.;  Surgeon: Ivin Poot, MD;  Location: [facility];  Service: Open Heart Surgery;  Laterality: N/A;  . HERNIA REPAIR    . INTRAOPERATIVE TRANSESOPHAGEAL ECHOCARDIOGRAM N/A 10/29/2014   Procedure: INTRAOPERATIVE TRANSESOPHAGEAL ECHOCARDIOGRAM;  Surgeon: Ivin Poot, MD;  Location: [facility];  Service: Open Heart Surgery;  Laterality: N/A;  . JOINT REPLACEMENT Right 2009   hip total  . LEFT HEART CATHETERIZATION WITH CORONARY ANGIOGRAM N/A 10/26/2014   Procedure: LEFT HEART CATHETERIZATION WITH CORONARY  ANGIOGRAM;  Surgeon: Troy Sine, MD;  Location: [facility] CATH LAB;  Service: Cardiovascular;  Laterality: N/A;  . TONSILLECTOMY    . TOTAL HIP ARTHROPLASTY    . TOTAL HIP ARTHROPLASTY Left 05/07/2016   Procedure: TOTAL HIP ARTHROPLASTY ANTERIOR APPROACH;  Surgeon: Rod Can, MD;  Location: [facility];  Service: Orthopedics;  Laterality: Left;     No Known Allergies    Family History  Problem Relation Age of Onset  . Hypertension Mother   . Coronary artery disease Mother        CABG  . Hypertension Father   . CAD Father        CABG  . Coronary artery disease Brother        Stent     Social History Ms. Nayak reports that she  has never smoked. She has never used smokeless tobacco. Ms. Mercer reports no history of alcohol use.   Review of Systems CONSTITUTIONAL: No weight loss, fever, chills, weakness or fatigue.  HEENT: Eyes: No visual loss, blurred vision, double vision or yellow sclerae.No hearing loss, sneezing, congestion, runny nose or sore throat.  SKIN: No rash or itching.  CARDIOVASCULAR: per hpi RESPIRATORY: No shortness of breath, cough or sputum.  GASTROINTESTINAL: No anorexia, nausea, vomiting or diarrhea. No abdominal pain or blood.  GENITOURINARY: No burning on urination, no polyuria NEUROLOGICAL: No headache, dizziness, syncope, paralysis, ataxia, numbness or tingling in the extremities. No change in bowel or bladder control.  MUSCULOSKELETAL: No muscle, back pain, joint pain or stiffness.  LYMPHATICS: No enlarged nodes. No history of splenectomy.  PSYCHIATRIC: No history of depression or anxiety.  ENDOCRINOLOGIC: No reports of sweating, cold or heat intolerance. No polyuria or polydipsia.  Marland Kitchen   Physical Examination Today's Vitals   02/17/19 1557  BP: 130/74  Pulse: 70  SpO2: 99%  Weight: 234 lb (106.1 kg)  Height: 5\' 3"  (1.6 m)   Body mass index is 41.45 kg/m.  Gen: resting comfortably, no acute distress HEENT: no scleral icterus, pupils equal round and reactive, no palptable cervical adenopathy,  CV: RRR, 2/6 systolic murmur rusb, no jvd Resp: Clear to auscultation bilaterally GI: abdomen is soft, non-tender, non-distended, normal bowel sounds, no hepatosplenomegaly MSK: extremities are warm, no edema.  Skin: warm, no rash Neuro:  no focal deficits Psych: appropriate affect   Diagnostic Studies 10/2014 Cath HEMODYNAMICS:  Central Aorta: 140/66  Left Ventricle: 140/18  ANGIOGRAPHY:  The left main coronary artery was a large-caliber vessel which bifurcated into the LAD and left circumflex coronary artery. There was a 70% distal left main stenosis prior to its  bifurcation with a suggestion of a focal napkin ring like appearance. This did not improve following IC nitroglycerin administration.  The LAD was angiographically normal and gave rise to 2 major diagonal vessels and several septal perforating arteries. The vessel extended to the LV apex.   The left circumflex coronary artery was angiographically normal and gave rise to one major bifurcating obtuse marginal branch.   The RCA was angiographically normal it gave rise to a PDA and PLA vessel.   Left ventriculography revealed normal global LV contractility without focal segmental wall motion abnormalities. There was no evidence for mitral regurgitation. Ejection fraction was 60%.   Total contrast used: 85 cc Omnipaque  IMPRESSION:  Significant coronary obstructive disease with 70% distal left main stenosis.  Normal LV function.   RECOMMENDATION:  Surgical consultation for consideration for CABG.  10/2014 Echo Study Conclusions  - Left ventricle: The cavity  size was normal. Wall thickness was increased in a pattern of mild LVH. Systolic function was normal. The estimated ejection fraction was in the range of 60% to 65%. Diastolic function is abnormal, indeterminant grade. Wall motion was normal; there were no regional wall motion abnormalities. - Aortic valve: Moderately calcified annulus. Trileaflet; mildly thickened leaflets. There is aortic sclerosis without significant stenosis. There was trivial regurgitation. Valve area (VTI): 1.79 cm^2. Valve area (Vmax): 1.82 cm^2. - Mitral valve: Mildly calcified annulus. Mildly thickened leaflets . - Atrial septum: No defect or patent foramen ovale was identified. - Technically adequate study. 10/2014 Carotid US Summary:  - The vertebral arteries appear patent with antegrade flow. - Findings consistent with 1- 39 percent stenosis involving the right internal carotid artery and the left internal  carotid artery. - ICA/CCA ratio. right = 0.81. left =1.36. - Palpable pedal pulses.  05/2015 echo Study Conclusions  - Left ventricle: The cavity size was normal. Wall thickness was  increased in a pattern of moderate LVH. Systolic function was  normal. The estimated ejection fraction was in the range of 60%  to 65%. Wall motion was normal; there were no regional wall  motion abnormalities. Doppler parameters are consistent with  abnormal left ventricular relaxation (grade 1 diastolic  dysfunction). - Aortic valve: Trileaflet; moderately calcified leaflets. Left  coronary cusp mobility was restricted. There was mild stenosis.  Mean gradient (S): 16 mm Hg. Peak gradient (S): 27 mm Hg. VTI  ratio of LVOT to aortic valve: 0.48. Valve area (VTI): 1.5 cm^2. - Mitral valve: Calcified annulus. There was trivial regurgitation. - Right atrium: Central venous pressure (est): 3 mm Hg. - Atrial septum: No defect or patent foramen ovale was identified. - Tricuspid valve: There was trivial regurgitation. - Pulmonary arteries: Systolic pressure could not be accurately  estimated. - Pericardium, extracardiac: There was no pericardial effusion.  Impressions:  - Moderate LVH with LVEF 60-65% and grade 1 diastolic dysfunction.  Mild calcific aortic stenosis as outlined. Trivial tricuspid  regurgitation, unable to assess PASP.   03/2016 Carotid US Mild bilateral disease   10/2017 nuclear stress  There was no ST segment deviation noted during stress.  This is a low risk study.  The left ventricular ejection fraction is normal (55-65%).  Small basal inferolateral defect with mild reversibility. This may represent a small prior infarct with mild ischemia, however there is significant adjacent radiotracer uptake which may affect the defect. Either finding would support low risk   09/2017 echo Study Conclusions  - Left ventricle: The cavity size was normal. Wall  thickness was   increased in a pattern of mild LVH. Systolic function was normal.   The estimated ejection fraction was in the range of 60% to 65%.   Wall motion was normal; there were no regional wall motion   abnormalities. Doppler parameters are consistent with abnormal   left ventricular relaxation (grade 1 diastolic dysfunction). - Aortic valve: There was mild stenosis. Mean gradient (S): 16 mm   Hg. Mean gradient of 19 by pedhoff probe. Valve area (VTI): 1.8   cm^2. Valve area (Vmax): 1.81 cm^2. Valve area (Vmean): 1.66   cm^2. - Mitral valve: Mildly calcified annulus. Normal thickness leaflets   . - Technically adequate study.  Assessment and Plan   1. CAD -no symptoms, continue current meds - EKG SR, no acute ischemic changes  2. HTN - at goal, continue current meds  3. HL  - request labs from pcp, continue statin  4. Aortic  stenosis - mild by echo, likely reperat echo next year   F/u 1 year  Arnoldo Lenis, M.D.

## 2019-02-27 ENCOUNTER — Ambulatory Visit (HOSPITAL_COMMUNITY)
Admission: RE | Admit: 2019-02-27 | Discharge: 2019-02-27 | Disposition: A | Discharge status: Home / Self Care | Payer: Medicare HMO | Source: Ambulatory Visit | Attending: Family Medicine | Admitting: Family Medicine

## 2019-02-27 DIAGNOSIS — Z1231 Encounter for screening mammogram for malignant neoplasm of breast: Secondary | ICD-10-CM | POA: Insufficient documentation

## 2019-03-01 ENCOUNTER — Other Ambulatory Visit (HOSPITAL_COMMUNITY): Payer: Self-pay | Admitting: Family Medicine

## 2019-03-01 DIAGNOSIS — R928 Other abnormal and inconclusive findings on diagnostic imaging of breast: Secondary | ICD-10-CM

## 2019-03-21 ENCOUNTER — Other Ambulatory Visit: Payer: Self-pay

## 2019-03-21 ENCOUNTER — Ambulatory Visit (HOSPITAL_COMMUNITY)
Admission: RE | Admit: 2019-03-21 | Discharge: 2019-03-21 | Disposition: A | Discharge status: Home / Self Care | Payer: Medicare HMO | Source: Ambulatory Visit | Attending: Family Medicine | Admitting: Family Medicine

## 2019-03-21 DIAGNOSIS — R928 Other abnormal and inconclusive findings on diagnostic imaging of breast: Secondary | ICD-10-CM | POA: Diagnosis not present

## 2019-03-21 DIAGNOSIS — N6325 Unspecified lump in the left breast, overlapping quadrants: Secondary | ICD-10-CM | POA: Diagnosis not present

## 2019-07-04 ENCOUNTER — Telehealth: Payer: Self-pay | Admitting: Cardiology

## 2019-07-04 DIAGNOSIS — R002 Palpitations: Secondary | ICD-10-CM

## 2019-07-04 NOTE — Telephone Encounter (Signed)
Patient called stating that for the past several weeks she is having a lot of heart flutters. States that she feels like she is smothering. Spoke with Dr. Armandina Gemma (PCP) and she was advised to contact our office.

## 2019-07-04 NOTE — Telephone Encounter (Signed)
Pt says for the last 3-4 weeks fluttering in her chest has been worse lasting 30 mins to an hour almost daily - denies any dizziness/chest pain - does have some SOB and swelling in her feet but says this is nothing new and doesn't think those symptoms are related to the fluttering - denies any medication changes

## 2019-07-05 NOTE — Telephone Encounter (Signed)
Can we set her up with a 1 week event monitor for palpitations   J Sayid Moll MD

## 2019-07-05 NOTE — Telephone Encounter (Signed)
Pt agreeable to event monitor - will place orders and enroll  

## 2019-07-10 ENCOUNTER — Encounter (INDEPENDENT_AMBULATORY_CARE_PROVIDER_SITE_OTHER): Payer: Medicare HMO

## 2019-07-10 DIAGNOSIS — R002 Palpitations: Secondary | ICD-10-CM

## 2019-07-18 DIAGNOSIS — Z1389 Encounter for screening for other disorder: Secondary | ICD-10-CM | POA: Diagnosis not present

## 2019-07-18 DIAGNOSIS — E7849 Other hyperlipidemia: Secondary | ICD-10-CM | POA: Diagnosis not present

## 2019-07-18 DIAGNOSIS — E119 Type 2 diabetes mellitus without complications: Secondary | ICD-10-CM | POA: Diagnosis not present

## 2019-07-18 DIAGNOSIS — I10 Essential (primary) hypertension: Secondary | ICD-10-CM | POA: Diagnosis not present

## 2019-07-18 DIAGNOSIS — I5032 Chronic diastolic (congestive) heart failure: Secondary | ICD-10-CM | POA: Diagnosis not present

## 2019-07-18 DIAGNOSIS — Z6841 Body Mass Index (BMI) 40.0 and over, adult: Secondary | ICD-10-CM | POA: Diagnosis not present

## 2019-07-18 DIAGNOSIS — I2510 Atherosclerotic heart disease of native coronary artery without angina pectoris: Secondary | ICD-10-CM | POA: Diagnosis not present

## 2019-08-03 DIAGNOSIS — Z6841 Body Mass Index (BMI) 40.0 and over, adult: Secondary | ICD-10-CM | POA: Diagnosis not present

## 2019-08-03 DIAGNOSIS — Z1389 Encounter for screening for other disorder: Secondary | ICD-10-CM | POA: Diagnosis not present

## 2019-08-03 DIAGNOSIS — E119 Type 2 diabetes mellitus without complications: Secondary | ICD-10-CM | POA: Diagnosis not present

## 2019-08-03 DIAGNOSIS — E7849 Other hyperlipidemia: Secondary | ICD-10-CM | POA: Diagnosis not present

## 2019-08-14 ENCOUNTER — Telehealth: Payer: Self-pay | Admitting: *Deleted

## 2019-08-14 NOTE — Telephone Encounter (Signed)
Notes recorded by Arlo Butt T, CMA on 08/14/2019 at 4:35 PM EDT  Pt says palpitations are much better than they were since decrease of caffeine intake (says she doesn't drink alcohol) - aware that if symptoms get worse or bothersome to call us back  ------   Notes recorded by Arnoldo Lenis, MD on 08/11/2019 at 1:36 PM EDT  Normal heart monitor, no significant abnormal rhythms? Is she still having palpitations? Has she tried cutting back on any caffeine or alcohol intake. If ongoing symptoms and already tried weaning caffeine/EtOH we may need to adjust her metoprolol. Please update me on her symptoms    Zandra Abts MD

## 2020-02-05 DIAGNOSIS — H52223 Regular astigmatism, bilateral: Secondary | ICD-10-CM | POA: Diagnosis not present

## 2020-02-05 DIAGNOSIS — H524 Presbyopia: Secondary | ICD-10-CM | POA: Diagnosis not present

## 2020-02-05 DIAGNOSIS — E119 Type 2 diabetes mellitus without complications: Secondary | ICD-10-CM | POA: Diagnosis not present

## 2020-02-05 DIAGNOSIS — Z0100 Encounter for examination of eyes and vision without abnormal findings: Secondary | ICD-10-CM | POA: Diagnosis not present

## 2020-02-05 DIAGNOSIS — H2513 Age-related nuclear cataract, bilateral: Secondary | ICD-10-CM | POA: Diagnosis not present

## 2020-02-05 DIAGNOSIS — H5203 Hypermetropia, bilateral: Secondary | ICD-10-CM | POA: Diagnosis not present

## 2020-03-20 NOTE — Progress Notes (Addendum)
Cardiology Office Note  Date: 03/21/2020   ID: Alekhya, Zucco Jun 05, 1956, MRN [id number]  PCP:  [facility]  Cardiologist:  Carlyle Dolly, MD Electrophysiologist:  None   Chief Complaint: CAD, HTN, HLD, Palpitations   History of Present Illness: Karina Green is a 64 y.o. female with a history of CAD w/CABG Nov 2015 (LIMA-LAD,SVG-OM), HTN, HLD, AS, palpitations.   Last office visit 03/18/2019 with Dr. Harl Bowie.  Patient was having no anginal or exertional symptoms.  Blood pressure was at goal, aortic stenosis was mild by echo.  There was mention of likely repeating the echo next year.  Patient was having palpitations last year in July and she  had an event monitor placed.  The results were normal.  Patient had no significant abnormal rhythms.  After cutting down on caffeine patient stated palpitations were much better.  She was advised to call back if symptoms became worse.  She presents today with complaints of progressively increasing dyspnea on exertion when bringing her garbage container from the street up the hill to her house.  She states this has been worsening over the last 6 months.  Also continues to complain of sporadic palpitations which have no particular pattern occurring several times per week associated with and without exertion.  She denies CVA or TIA-like symptoms, orthostatic symptoms, bleeding issues, claudication-like symptoms, DVT or PE-like symptoms, or lower extremity edema.  Blood pressure is elevated on arrival today at 144/80.  Heart rate of 76  Past Medical History:  Diagnosis Date  . Arthritis   . Back pain   . CAD (coronary artery disease), native coronary artery-70% LM stenosis 10/25/2014  . Cough last 30 yrs    nonproductive, no fevers  . DM type 2 (diabetes mellitus, type 2) ([place]) 10/25/14  . Gout   . Hyperlipidemia   . Hypertension     Past Surgical History:  Procedure Laterality Date  . ABDOMINAL HYSTERECTOMY     complete  . APPENDECTOMY    . bladder tack    . CARDIAC CATHETERIZATION  10/25/14   70% LM stensis, normal EF  . CHOLECYSTECTOMY    . CORONARY ARTERY BYPASS GRAFT N/A 10/29/2014   Procedure: CORONARY ARTERY BYPASS GRAFTING (CABG) TIMES TWO USING LEFT INTERNAL MAMMARY ARTERY AND RIGHT LEG GREATER SAPHENOUS VEIN HARVESTED ENDOSCOPICALLY.;  Surgeon: Ivin Poot, MD;  Location: [facility];  Service: Open Heart Surgery;  Laterality: N/A;  . HERNIA REPAIR    . INTRAOPERATIVE TRANSESOPHAGEAL ECHOCARDIOGRAM N/A 10/29/2014   Procedure: INTRAOPERATIVE TRANSESOPHAGEAL ECHOCARDIOGRAM;  Surgeon: Ivin Poot, MD;  Location: [facility];  Service: Open Heart Surgery;  Laterality: N/A;  . JOINT REPLACEMENT Right 2009   hip total  . LEFT HEART CATHETERIZATION WITH CORONARY ANGIOGRAM N/A 10/26/2014   Procedure: LEFT HEART CATHETERIZATION WITH CORONARY ANGIOGRAM;  Surgeon: Troy Sine, MD;  Location: [facility] CATH LAB;  Service: Cardiovascular;  Laterality: N/A;  . TONSILLECTOMY    . TOTAL HIP ARTHROPLASTY    . TOTAL HIP ARTHROPLASTY Left 05/07/2016   Procedure: TOTAL HIP ARTHROPLASTY ANTERIOR APPROACH;  Surgeon: Rod Can, MD;  Location[facility];  Service: Orthopedics;  Laterality: Left;    Current Outpatient Medications  Medication Sig Dispense Refill  . allopurinol (ZYLOPRIM) 100 MG tablet Take 1 tablet by mouth daily.    Marland Kitchen aspirin EC 81 MG tablet Take 81 mg by mouth 2 (two) times daily.    Marland Kitchen atorvastatin (LIPITOR) 80 MG tablet TAKE ONE TABLET BY MOUTH  ONCE DAILY AT 6 P.M. 90 tablet 01  . colchicine 0.6 MG tablet Take 1 tablet by mouth daily as needed.    . furosemide (LASIX) 20 MG tablet Take 1 tablet (20 mg total) by mouth daily. 90 tablet 1  . isosorbide mononitrate (IMDUR) 30 MG 24 hr tablet Take 0.5 tablets (15 mg total) by mouth daily. 45 tablet 3  . losartan (COZAAR) 25 MG tablet Take 0.5 tablets (12.5 mg total) daily by mouth. 45 tablet 3  . metoprolol tartrate (LOPRESSOR) 25 MG tablet Take 0.5  tablets (12.5 mg total) by mouth 2 (two) times daily. 90 tablet 1  . Multiple Vitamin (MULTIVITAMIN WITH MINERALS) TABS tablet Take 1 tablet by mouth daily.    . Omega-3 Fatty Acids (FISH OIL) 1000 MG CAPS Take 1 capsule by mouth daily.    . potassium chloride SA (K-DUR,KLOR-CON) 20 MEQ tablet Take 1 tablet (20 mEq total) by mouth daily. NEED OV. 90 tablet 0   No current facility-administered medications for this visit.   Allergies:  Patient has no known allergies.   Social History: The patient  reports that she has never smoked. She has never used smokeless tobacco. She reports that she does not drink alcohol or use drugs.   Family History: The patient's family history includes CAD in her father; Coronary artery disease in her brother and mother; Hypertension in her father and mother.   ROS:  Please see the history of present illness. Otherwise, complete review of systems is positive for none.  All other systems are reviewed and negative.   Physical Exam: VS:  BP (!) 144/80   Pulse 76   Ht 5\' 3"  (1.6 m)   Wt 231 lb 12.8 oz (105.1 kg)   SpO2 97%   BMI 41.06 kg/m , BMI Body mass index is 41.06 kg/m.  Wt Readings from Last 3 Encounters:  03/21/20 231 lb 12.8 oz (105.1 kg)  02/17/19 234 lb (106.1 kg)  11/05/17 216 lb (98 kg)    General: Patient appears comfortable at rest. Neck: Supple, no elevated JVP or carotid bruits, no thyromegaly. Lungs: Clear to auscultation, nonlabored breathing at rest. Cardiac: Regular rate and rhythm, no S3 or significant systolic murmur, no pericardial rub. Extremities: No pitting edema, distal pulses 2+. Skin: Warm and dry. Musculoskeletal: No kyphosis. Neuropsychiatric: Alert and oriented x3, affect grossly appropriate.  ECG:  An ECG dated 03/21/2020 was personally reviewed today and demonstrated:  Sinus rhythm with occasional PVC rate of 63, ST and T wave abnormality, consider anterior ischemia.  Recent Labwork: No results found for requested labs  within last 8760 hours.     Component Value Date/Time   CHOL 177 10/25/2014 0407   TRIG 235 (H) 10/25/2014 0407   HDL 30 (L) 10/25/2014 0407   CHOLHDL 5.9 10/25/2014 0407   VLDL 47 (H) 10/25/2014 0407   LDLCALC 100 (H) 10/25/2014 0407    Other Studies Reviewed Today:  Event Monitor  06/2019 Study Highlights   7 day event monitor  Min HR 49, Max HR 120, average HR 73. Min HR occurred in early AM hours presumably while sleeping  No symptoms reported  Telemetry tracings show normal sinus rhythm       10/2014 Echo Study Conclusions  - Left ventricle: The cavity size was normal. Wall thickness was increased in a pattern of mild LVH. Systolic function was normal. The estimated ejection fraction was in the range of 60% to 65%. Diastolic function is abnormal, indeterminant grade. Wall motion  was normal; there were no regional wall motion abnormalities. - Aortic valve: Moderately calcified annulus. Trileaflet; mildly thickened leaflets. There is aortic sclerosis without significant stenosis. There was trivial regurgitation. Valve area (VTI): 1.79 cm^2. Valve area (Vmax): 1.82 cm^2. - Mitral valve: Mildly calcified annulus. Mildly thickened leaflets . - Atrial septum: No defect or patent foramen ovale was identified. - Technically adequate study.  10/2014 Carotid US Summary:  - The vertebral arteries appear patent with antegrade flow. - Findings consistent with 1- 39 percent stenosis involving the right internal carotid artery and the left internal carotid artery. - ICA/CCA ratio. right = 0.81. left =1.36. - Palpable pedal pulses.  05/2015 echo Study Conclusions  - Left ventricle: The cavity size was normal. Wall thickness was  increased in a pattern of moderate LVH. Systolic function was  normal. The estimated ejection fraction was in the range of 60%  to 65%. Wall motion was normal; there were no regional wall  motion abnormalities.  Doppler parameters are consistent with  abnormal left ventricular relaxation (grade 1 diastolic  dysfunction). - Aortic valve: Trileaflet; moderately calcified leaflets. Left  coronary cusp mobility was restricted. There was mild stenosis.  Mean gradient (S): 16 mm Hg. Peak gradient (S): 27 mm Hg. VTI  ratio of LVOT to aortic valve: 0.48. Valve area (VTI): 1.5 cm^2. - Mitral valve: Calcified annulus. There was trivial regurgitation. - Right atrium: Central venous pressure (est): 3 mm Hg. - Atrial septum: No defect or patent foramen ovale was identified. - Tricuspid valve: There was trivial regurgitation. - Pulmonary arteries: Systolic pressure could not be accurately  estimated. - Pericardium, extracardiac: There was no pericardial effusion.  Impressions:  - Moderate LVH with LVEF 60-65% and grade 1 diastolic dysfunction.  Mild calcific aortic stenosis as outlined. Trivial tricuspid  regurgitation, unable to assess PASP.   03/2016 Carotid US Mild bilateral disease   10/2017 nuclear stress  There was no ST segment deviation noted during stress.  This is a low risk study.  The left ventricular ejection fraction is normal (55-65%).  Small basal inferolateral defect with mild reversibility. This may represent a small prior infarct with mild ischemia, however there is significant adjacent radiotracer uptake which may affect the defect. Either finding would support low risk   09/2017 echo Study Conclusions  - Left ventricle: The cavity size was normal. Wall thickness was increased in a pattern of mild LVH. Systolic function was normal. The estimated ejection fraction was in the range of 60% to 65%. Wall motion was normal; there were no regional wall motion abnormalities. Doppler parameters are consistent with abnormal left ventricular relaxation (grade 1 diastolic dysfunction). - Aortic valve: There was mild stenosis. Mean gradient (S): 16 mm Hg.  Mean gradient of 19 by pedhoff probe. Valve area (VTI): 1.8 cm^2. Valve area (Vmax): 1.81 cm^2. Valve area (Vmean): 1.66 cm^2. - Mitral valve: Mildly calcified annulus. Normal thickness leaflets . - Technically adequate study.  Assessment and Plan:  1. CAD in native artery   2. Palpitations   3. Essential hypertension   4. Mixed hyperlipidemia   5. Aortic valve stenosis, mild    1. CAD in native artery  Hx CABG Nov 2015 (LIMA-LAD,SVG-OM) complaining of increasing dyspnea on exertion when bringing trash container from street up the hill to her house.  This could be an anginal equivalent.  She denies any anginal symptoms such as chest, neck, back, arm pain, nausea, diaphoresis on exertional effort.  Continue aspirin 81 mg, Imdur 15 mg daily,  2. Palpitations Previous event monitor for palpitations July 2020 showed no significant arrhythmias. She reduced caffeine intake and palpitations improved.  She continues to complain of sporadic palpitations.  Increase metoprolol tartrate to 25 mg p.o. twice daily.  3. Essential hypertension Blood pressure elevated at 144/80 on arrival.  Recheck in left arm 140/80.  Continue Lasix 20 mg daily.  4. Mixed hyperlipidemia Last recorded lipid profile 10/14/2018 showed cholesterol 154, triglycerides 164, HDL 37, LDL 84.  Continue atorvastatin 80 mg daily .Last lipid panel at PCP August 03, 2019 showed total cholesterol 177, triglyceride 222, HDL 33, LDL 100  5. Aortic valve stenosis, mild Last echo performed in 2018 showed mild LVH, EF 60 to 65%, grade 1 DD, mild aortic stenosis with a mean gradient of 16 mmHg, mean gradient of 19 by pedhoff probe Get a repeat echocardiogram  Medication Adjustments/Labs and Tests Ordered: Current medicines are reviewed at length with the patient today.  Concerns regarding medicines are outlined above.   Disposition: Follow-up with Dr Harl Bowie or APP 2 months  Signed, Levell July, NP 03/21/2020 8:09 AM    [facility] [place], [place], [place] [address] Phone: [phone]; Fax: [phone]

## 2020-03-21 ENCOUNTER — Encounter: Payer: Self-pay | Admitting: *Deleted

## 2020-03-21 ENCOUNTER — Telehealth: Payer: Self-pay | Admitting: Family Medicine

## 2020-03-21 ENCOUNTER — Other Ambulatory Visit: Payer: Self-pay

## 2020-03-21 ENCOUNTER — Ambulatory Visit (INDEPENDENT_AMBULATORY_CARE_PROVIDER_SITE_OTHER): Payer: Medicare HMO | Admitting: Family Medicine

## 2020-03-21 ENCOUNTER — Ambulatory Visit: Payer: Medicare HMO | Admitting: Family Medicine

## 2020-03-21 ENCOUNTER — Encounter: Payer: Self-pay | Admitting: Family Medicine

## 2020-03-21 VITALS — BP 140/80 | HR 76 | Ht 63.0 in | Wt 231.8 lb

## 2020-03-21 DIAGNOSIS — I10 Essential (primary) hypertension: Secondary | ICD-10-CM

## 2020-03-21 DIAGNOSIS — I2510 Atherosclerotic heart disease of native coronary artery without angina pectoris: Secondary | ICD-10-CM

## 2020-03-21 DIAGNOSIS — I350 Nonrheumatic aortic (valve) stenosis: Secondary | ICD-10-CM | POA: Diagnosis not present

## 2020-03-21 DIAGNOSIS — R002 Palpitations: Secondary | ICD-10-CM | POA: Diagnosis not present

## 2020-03-21 DIAGNOSIS — E782 Mixed hyperlipidemia: Secondary | ICD-10-CM | POA: Diagnosis not present

## 2020-03-21 MED ORDER — METOPROLOL TARTRATE 25 MG PO TABS: 25.0000 mg | ORAL_TABLET | Freq: Two times a day (BID) | ORAL | 1 refills | Status: DC

## 2020-03-21 NOTE — Addendum Note (Signed)
Addended by: Julian Hy T on: 03/21/2020 10:53 AM   Modules accepted: Orders

## 2020-03-21 NOTE — Telephone Encounter (Signed)
°  Precert needed for: Echo   Location: [facility]    Date: April 11, 2020

## 2020-03-21 NOTE — Patient Instructions (Signed)
Medication Instructions:   Your physician has recommended you make the following change in your medication:  Increase metoprolol to 25 mg by mouth twice daily  Continue other medications the same  Labwork:  NONE  Testing/Procedures: Your physician has requested that you have an echocardiogram. Echocardiography is a painless test that uses sound waves to create images of your heart. It provides your doctor with information about the size and shape of your heart and how well your heart's chambers and valves are working. This procedure takes approximately one hour. There are no restrictions for this procedure.  Follow-Up:  Your physician recommends that you schedule a follow-up appointment in: 2 months (office).  Any Other Special Instructions Will Be Listed Below (If Applicable).  If you need a refill on your cardiac medications before your next appointment, please call your pharmacy.

## 2020-03-27 ENCOUNTER — Telehealth: Payer: Self-pay | Admitting: *Deleted

## 2020-03-27 MED ORDER — EZETIMIBE 10 MG PO TABS: 10.0000 mg | ORAL_TABLET | Freq: Every day | ORAL | 3 refills | Status: DC

## 2020-03-27 NOTE — Telephone Encounter (Signed)
Patient informed and verbalized understanding of plan. 

## 2020-03-27 NOTE — Telephone Encounter (Signed)
-----   Message from Verta Ellen., NP sent at 03/26/2020  8:58 AM EDT ----- Please call Karina Green and tell her that her LDL is still above goal. Ask her if she be willing to start Zetia 10 mg daily together LDL closer to 70 or less. If she agrees please order that. Thank you

## 2020-04-08 ENCOUNTER — Telehealth: Payer: Self-pay | Admitting: Family Medicine

## 2020-04-08 NOTE — Telephone Encounter (Signed)
Patient called stating that she needs directions on when she is suppose to take the following medication. ezetimibe (ZETIA) 10 MG tablet

## 2020-04-08 NOTE — Telephone Encounter (Signed)
Left message for call back with her boyfriend.  Did not see a designated release in her chart.

## 2020-04-11 ENCOUNTER — Other Ambulatory Visit: Payer: Self-pay

## 2020-04-11 ENCOUNTER — Ambulatory Visit (INDEPENDENT_AMBULATORY_CARE_PROVIDER_SITE_OTHER): Payer: Medicare HMO

## 2020-04-11 ENCOUNTER — Telehealth: Payer: Self-pay | Admitting: *Deleted

## 2020-04-11 DIAGNOSIS — I350 Nonrheumatic aortic (valve) stenosis: Secondary | ICD-10-CM | POA: Diagnosis not present

## 2020-04-11 NOTE — Telephone Encounter (Signed)
-----   Message from Verta Ellen., NP sent at 04/11/2020  3:43 PM EDT ----- Please call the patient and tell her the echo showed her aortic stenosis is moderate now compared to the study done in 2018 when it was mild. Tell her we just need to check it annually with repeat echoes to keep an eye on it. Her pumping function was good.

## 2020-04-11 NOTE — Telephone Encounter (Signed)
Requesting what time of day to take zetia. Reports taking it in the morning. Advised this was fine as long as she took it at the same time each day. Verbalized understanding.

## 2020-04-11 NOTE — Telephone Encounter (Signed)
Patient informed. Copy sent to PCP °

## 2020-05-21 ENCOUNTER — Ambulatory Visit: Payer: Medicare HMO | Admitting: Family Medicine

## 2020-05-27 ENCOUNTER — Ambulatory Visit: Payer: Medicare HMO | Admitting: Family Medicine

## 2020-05-29 NOTE — Progress Notes (Signed)
.    Cardiology Office Note  Date: 05/30/2020   ID: TAQUANNA BORRAS, DOB 1956-03-02, MRN [id number]  PCP:  [facility]  Cardiologist:  Carlyle Dolly, MD Electrophysiologist:  None   Chief Complaint: CAD, HTN, HLD, Palpitations   History of Present Illness: SABRE ROMBERGER is a 64 y.o. female with a history of CAD w/CABG Nov 2015 (LIMA-LAD,SVG-OM), HTN, HLD, AS, palpitations.   Last office visit 03/18/2019 with Dr. Harl Bowie.  Patient was having no anginal or exertional symptoms.  Blood pressure was at goal, aortic stenosis was mild by echo.  There was mention of likely repeating the echo next year.  Patient was having palpitations last year in July and she  had an event monitor placed.  The results were normal.  Patient had no significant abnormal rhythms.  After cutting down on caffeine patient stated palpitations were much better.  She was advised to call back if symptoms became worse.  She presents today with complaints of progressively increasing dyspnea on exertion when bringing her garbage container from the street up the hill to her house.  She states this has been worsening over the last 6 months.  Also continues to complain of sporadic palpitations which have no particular pattern occurring several times per week associated with and without exertion.  She denies CVA or TIA-like symptoms, orthostatic symptoms, bleeding issues, claudication-like symptoms, DVT or PE-like symptoms, or lower extremity edema.  Blood pressure is elevated on arrival today at 144/80.  Heart rate of 76  Patient states she had to stop the metoprolol due to feeling bad after taking it for 2 days.  States she has cut way down on caffeine intake and only drinks decaf coffee now.  States the palpitations are much less frequent.  Continues to complain of some exertional dyspnea.  Recent echo shows moderate aortic stenosis which is increased from last visit with a peak gradient of 41 versus 28 on a  previous echo.  She has symptoms of possible sleep apnea such as snoring.  She is not interested in having a sleep study.  She states she doubts if study were positive she could tolerate wearing the CPAP device.  Past Medical History:  Diagnosis Date  . Arthritis   . Back pain   . CAD (coronary artery disease), native coronary artery-70% LM stenosis 10/25/2014  . Cough last 30 yrs    nonproductive, no fevers  . DM type 2 (diabetes mellitus, type 2) ([place]) 10/25/14  . Gout   . Hyperlipidemia   . Hypertension     Past Surgical History:  Procedure Laterality Date  . ABDOMINAL HYSTERECTOMY     complete  . APPENDECTOMY    . bladder tack    . CARDIAC CATHETERIZATION  10/25/14   70% LM stensis, normal EF  . CHOLECYSTECTOMY    . CORONARY ARTERY BYPASS GRAFT N/A 10/29/2014   Procedure: CORONARY ARTERY BYPASS GRAFTING (CABG) TIMES TWO USING LEFT INTERNAL MAMMARY ARTERY AND RIGHT LEG GREATER SAPHENOUS VEIN HARVESTED ENDOSCOPICALLY.;  Surgeon: Ivin Poot, MD;  Location: [facility];  Service: Open Heart Surgery;  Laterality: N/A;  . HERNIA REPAIR    . INTRAOPERATIVE TRANSESOPHAGEAL ECHOCARDIOGRAM N/A 10/29/2014   Procedure: INTRAOPERATIVE TRANSESOPHAGEAL ECHOCARDIOGRAM;  Surgeon: Ivin Poot, MD;  Location: [facility];  Service: Open Heart Surgery;  Laterality: N/A;  . JOINT REPLACEMENT Right 2009   hip total  . LEFT HEART CATHETERIZATION WITH CORONARY ANGIOGRAM N/A 10/26/2014   Procedure: LEFT HEART CATHETERIZATION WITH CORONARY  ANGIOGRAM;  Surgeon: Troy Sine, MD;  Location: [facility] CATH LAB;  Service: Cardiovascular;  Laterality: N/A;  . TONSILLECTOMY    . TOTAL HIP ARTHROPLASTY    . TOTAL HIP ARTHROPLASTY Left 05/07/2016   Procedure: TOTAL HIP ARTHROPLASTY ANTERIOR APPROACH;  Surgeon: Rod Can, MD;  Location: [facility];  Service: Orthopedics;  Laterality: Left;    Current Outpatient Medications  Medication Sig Dispense Refill  . allopurinol (ZYLOPRIM) 100 MG tablet Take 1 tablet by mouth  daily.    Marland Kitchen aspirin EC 81 MG tablet Take 81 mg by mouth 2 (two) times daily.    Marland Kitchen atorvastatin (LIPITOR) 80 MG tablet TAKE ONE TABLET BY MOUTH ONCE DAILY AT 6 P.M. 90 tablet 01  . colchicine 0.6 MG tablet Take 1 tablet by mouth daily as needed.    . ezetimibe (ZETIA) 10 MG tablet Take 1 tablet (10 mg total) by mouth daily. 90 tablet 3  . furosemide (LASIX) 20 MG tablet Take 1 tablet (20 mg total) by mouth daily. 90 tablet 1  . isosorbide mononitrate (IMDUR) 30 MG 24 hr tablet Take 0.5 tablets (15 mg total) by mouth daily. 45 tablet 3  . losartan (COZAAR) 25 MG tablet Take 0.5 tablets (12.5 mg total) daily by mouth. 45 tablet 3  . metoprolol tartrate (LOPRESSOR) 25 MG tablet Take 1 tablet (25 mg total) by mouth 2 (two) times daily. 180 tablet 1  . Multiple Vitamin (MULTIVITAMIN WITH MINERALS) TABS tablet Take 1 tablet by mouth daily.    . Omega-3 Fatty Acids (FISH OIL) 1000 MG CAPS Take 1 capsule by mouth daily.    . potassium chloride SA (K-DUR,KLOR-CON) 20 MEQ tablet Take 1 tablet (20 mEq total) by mouth daily. NEED OV. 90 tablet 0   No current facility-administered medications for this visit.   Allergies:  Patient has no known allergies.   Social History: The patient  reports that she has never smoked. She has never used smokeless tobacco. She reports that she does not drink alcohol and does not use drugs.   Family History: The patient's family history includes CAD in her father; Coronary artery disease in her brother and mother; Hypertension in her father and mother.   ROS:  Please see the history of present illness. Otherwise, complete review of systems is positive for none.  All other systems are reviewed and negative.   Physical Exam: VS:  BP 140/78   Pulse 68   Ht 5\' 3"  (1.6 m)   Wt 232 lb 9.6 oz (105.5 kg)   SpO2 98%   BMI 41.20 kg/m , BMI Body mass index is 41.2 kg/m.  Wt Readings from Last 3 Encounters:  05/30/20 232 lb 9.6 oz (105.5 kg)  03/21/20 231 lb 12.8 oz (105.1  kg)  02/17/19 234 lb (106.1 kg)    General: Patient appears comfortable at rest. Neck: Supple, no elevated JVP or carotid bruits, no thyromegaly. Lungs: Clear to auscultation, nonlabored breathing at rest. Cardiac: Regular rate and rhythm, no S3 or significant systolic murmur, no pericardial rub. Extremities: No pitting edema, distal pulses 2+. Skin: Warm and dry. Musculoskeletal: No kyphosis. Neuropsychiatric: Alert and oriented x3, affect grossly appropriate.  ECG:  An ECG dated 03/21/2020 was personally reviewed today and demonstrated:  Sinus rhythm with occasional PVC rate of 63, ST and T wave abnormality, consider anterior ischemia.  Recent Labwork: No results found for requested labs within last 8760 hours.     Component Value Date/Time   CHOL 177 10/25/2014 0407  TRIG 235 (H) 10/25/2014 0407   HDL 30 (L) 10/25/2014 0407   CHOLHDL 5.9 10/25/2014 0407   VLDL 47 (H) 10/25/2014 0407   LDLCALC 100 (H) 10/25/2014 0407    Other Studies Reviewed Today:  Echocardiogram 04/11/2020  1. Left ventricular ejection fraction, by estimation, is 60 to 65%. The left ventricle has normal function. The left ventricle has no regional wall motion abnormalities. Left ventricular diastolic parameters are indeterminate. 2. Right ventricular systolic function is normal. The right ventricular size is normal. There is mildly elevated pulmonary artery systolic pressure. 3. Left atrial size was moderately dilated. 4. The mitral valve is normal in structure. No evidence of mitral valve regurgitation. No evidence of mitral stenosis. 5. The aortic valve is tricuspid. Aortic valve regurgitation is trivial. Moderate aortic valve stenosis.Aortic valve mean gradient measures 21.2 mmHg. Aortic valve peak gradient measures 41.4 mmHg. Aortic valve area, by VTI measures 1.10 cm. Comparison(s): Echocardiogram done 10/02/17 showed an EF of 65% with mild AS and an AV Peak Grad of 28 mmHg    Event Monitor   06/2019 Study Highlights   7 day event monitor  Min HR 49, Max HR 120, average HR 73. Min HR occurred in early AM hours presumably while sleeping  No symptoms reported  Telemetry tracings show normal sinus rhythm       10/2014 Echo Study Conclusions  - Left ventricle: The cavity size was normal. Wall thickness was increased in a pattern of mild LVH. Systolic function was normal. The estimated ejection fraction was in the range of 60% to 65%. Diastolic function is abnormal, indeterminant grade. Wall motion was normal; there were no regional wall motion abnormalities. - Aortic valve: Moderately calcified annulus. Trileaflet; mildly thickened leaflets. There is aortic sclerosis without significant stenosis. There was trivial regurgitation. Valve area (VTI): 1.79 cm^2. Valve area (Vmax): 1.82 cm^2. - Mitral valve: Mildly calcified annulus. Mildly thickened leaflets . - Atrial septum: No defect or patent foramen ovale was identified. - Technically adequate study.  10/2014 Carotid US Summary:  - The vertebral arteries appear patent with antegrade flow. - Findings consistent with 1- 39 percent stenosis involving the right internal carotid artery and the left internal carotid artery. - ICA/CCA ratio. right = 0.81. left =1.36. - Palpable pedal pulses.  05/2015 echo Study Conclusions  - Left ventricle: The cavity size was normal. Wall thickness was  increased in a pattern of moderate LVH. Systolic function was  normal. The estimated ejection fraction was in the range of 60%  to 65%. Wall motion was normal; there were no regional wall  motion abnormalities. Doppler parameters are consistent with  abnormal left ventricular relaxation (grade 1 diastolic  dysfunction). - Aortic valve: Trileaflet; moderately calcified leaflets. Left  coronary cusp mobility was restricted. There was mild stenosis.  Mean gradient (S): 16 mm Hg. Peak gradient (S): 27  mm Hg. VTI  ratio of LVOT to aortic valve: 0.48. Valve area (VTI): 1.5 cm^2. - Mitral valve: Calcified annulus. There was trivial regurgitation. - Right atrium: Central venous pressure (est): 3 mm Hg. - Atrial septum: No defect or patent foramen ovale was identified. - Tricuspid valve: There was trivial regurgitation. - Pulmonary arteries: Systolic pressure could not be accurately  estimated. - Pericardium, extracardiac: There was no pericardial effusion.  Impressions:  - Moderate LVH with LVEF 60-65% and grade 1 diastolic dysfunction.  Mild calcific aortic stenosis as outlined. Trivial tricuspid  regurgitation, unable to assess PASP.   03/2016 Carotid US Mild bilateral disease  10/2017 nuclear stress  There was no ST segment deviation noted during stress.  This is a low risk study.  The left ventricular ejection fraction is normal (55-65%).  Small basal inferolateral defect with mild reversibility. This may represent a small prior infarct with mild ischemia, however there is significant adjacent radiotracer uptake which may affect the defect. Either finding would support low risk   09/2017 echo Study Conclusions  - Left ventricle: The cavity size was normal. Wall thickness was increased in a pattern of mild LVH. Systolic function was normal. The estimated ejection fraction was in the range of 60% to 65%. Wall motion was normal; there were no regional wall motion abnormalities. Doppler parameters are consistent with abnormal left ventricular relaxation (grade 1 diastolic dysfunction). - Aortic valve: There was mild stenosis. Mean gradient (S): 16 mm Hg. Mean gradient of 19 by pedhoff probe. Valve area (VTI): 1.8 cm^2. Valve area (Vmax): 1.81 cm^2. Valve area (Vmean): 1.66 cm^2. - Mitral valve: Mildly calcified annulus. Normal thickness leaflets . - Technically adequate study.  Assessment and Plan:   1. CAD in native artery  Hx CABG  Nov 2015 (LIMA-LAD,SVG-OM) complained of increasing dyspnea on exertion when bringing trash container from street up the hill to her house.  This could be an anginal equivalent.  She denies any anginal symptoms such as chest, neck, back, arm pain, nausea, diaphoresis on exertional effort.  Continue aspirin 81 mg, Imdur 15 mg daily,  2. Palpitations Patient states she stopped her metoprolol because it made her feel better after 2 days.  States the frequency of palpitations has decreased since decreasing caffeine intake. Previous event monitor for palpitations July 2020 showed no significant arrhythmias.   3. Essential hypertension Blood pressure 140/78 today.  Continue losartan 25 mg 1/2 tablet daily.  Continue Lasix 20 mg daily continue Lasix 20 mg daily.  4. Mixed hyperlipidemia  Continue atorvastatin 80 mg daily .Last lipid panel at PCP August 03, 2019 showed total cholesterol 177, triglyceride 222, HDL 33, LDL 100  5. Aortic valve stenosis, mild Recent echocardiogram shows moderate aortic stenosis with a peak gradient of 41.4 versus a peak gradient of 28 on previous echo.  We will need to repeat echo yearly for surveillance  Medication Adjustments/Labs and Tests Ordered: Current medicines are reviewed at length with the patient today.  Concerns regarding medicines are outlined above.   Disposition: Follow-up with Dr Harl Bowie or APP 1 year  Signed, Levell July, NP 05/30/2020 3:55 PM    [facility] [address], [place], [place] [address] Phone: [phone]; Fax: [phone]

## 2020-05-30 ENCOUNTER — Ambulatory Visit: Payer: Medicare HMO | Admitting: Family Medicine

## 2020-05-30 ENCOUNTER — Encounter: Payer: Self-pay | Admitting: Family Medicine

## 2020-05-30 ENCOUNTER — Other Ambulatory Visit: Payer: Self-pay

## 2020-05-30 VITALS — BP 140/78 | HR 68 | Ht 63.0 in | Wt 232.6 lb

## 2020-05-30 DIAGNOSIS — R002 Palpitations: Secondary | ICD-10-CM

## 2020-05-30 DIAGNOSIS — I10 Essential (primary) hypertension: Secondary | ICD-10-CM

## 2020-05-30 DIAGNOSIS — E782 Mixed hyperlipidemia: Secondary | ICD-10-CM

## 2020-05-30 DIAGNOSIS — I2510 Atherosclerotic heart disease of native coronary artery without angina pectoris: Secondary | ICD-10-CM | POA: Diagnosis not present

## 2020-05-30 DIAGNOSIS — I350 Nonrheumatic aortic (valve) stenosis: Secondary | ICD-10-CM | POA: Diagnosis not present

## 2020-05-30 NOTE — Patient Instructions (Addendum)

## 2020-09-10 DIAGNOSIS — Z1389 Encounter for screening for other disorder: Secondary | ICD-10-CM | POA: Diagnosis not present

## 2020-09-10 DIAGNOSIS — Z23 Encounter for immunization: Secondary | ICD-10-CM | POA: Diagnosis not present

## 2020-09-10 DIAGNOSIS — I10 Essential (primary) hypertension: Secondary | ICD-10-CM | POA: Diagnosis not present

## 2020-09-10 DIAGNOSIS — Z0001 Encounter for general adult medical examination with abnormal findings: Secondary | ICD-10-CM | POA: Diagnosis not present

## 2020-09-10 DIAGNOSIS — I2510 Atherosclerotic heart disease of native coronary artery without angina pectoris: Secondary | ICD-10-CM | POA: Diagnosis not present

## 2020-09-10 DIAGNOSIS — Z1331 Encounter for screening for depression: Secondary | ICD-10-CM | POA: Diagnosis not present

## 2020-09-10 DIAGNOSIS — E7849 Other hyperlipidemia: Secondary | ICD-10-CM | POA: Diagnosis not present

## 2020-09-10 DIAGNOSIS — Z6841 Body Mass Index (BMI) 40.0 and over, adult: Secondary | ICD-10-CM | POA: Diagnosis not present

## 2020-09-10 DIAGNOSIS — E119 Type 2 diabetes mellitus without complications: Secondary | ICD-10-CM | POA: Diagnosis not present

## 2020-11-01 DIAGNOSIS — Z20822 Contact with and (suspected) exposure to covid-19: Secondary | ICD-10-CM | POA: Diagnosis not present

## 2021-07-01 ENCOUNTER — Other Ambulatory Visit (HOSPITAL_COMMUNITY): Payer: Self-pay | Admitting: Family Medicine

## 2021-07-01 DIAGNOSIS — R928 Other abnormal and inconclusive findings on diagnostic imaging of breast: Secondary | ICD-10-CM

## 2021-07-01 DIAGNOSIS — N63 Unspecified lump in unspecified breast: Secondary | ICD-10-CM

## 2021-07-29 ENCOUNTER — Encounter (HOSPITAL_COMMUNITY): Payer: Medicare HMO

## 2021-07-29 ENCOUNTER — Ambulatory Visit (HOSPITAL_COMMUNITY): Payer: Medicare HMO

## 2021-07-29 ENCOUNTER — Encounter (HOSPITAL_COMMUNITY): Payer: Self-pay

## 2021-08-12 DIAGNOSIS — N390 Urinary tract infection, site not specified: Secondary | ICD-10-CM | POA: Diagnosis not present

## 2021-08-12 DIAGNOSIS — R350 Frequency of micturition: Secondary | ICD-10-CM | POA: Diagnosis not present

## 2021-08-17 DIAGNOSIS — M10041 Idiopathic gout, right hand: Secondary | ICD-10-CM | POA: Diagnosis not present

## 2021-10-03 ENCOUNTER — Ambulatory Visit (INDEPENDENT_AMBULATORY_CARE_PROVIDER_SITE_OTHER): Payer: Medicare HMO | Admitting: Cardiology

## 2021-10-03 ENCOUNTER — Encounter: Payer: Self-pay | Admitting: Cardiology

## 2021-10-03 ENCOUNTER — Other Ambulatory Visit: Payer: Self-pay

## 2021-10-03 VITALS — BP 162/80 | HR 67 | Ht 63.0 in | Wt 233.6 lb

## 2021-10-03 DIAGNOSIS — R079 Chest pain, unspecified: Secondary | ICD-10-CM

## 2021-10-03 DIAGNOSIS — I10 Essential (primary) hypertension: Secondary | ICD-10-CM

## 2021-10-03 DIAGNOSIS — I2510 Atherosclerotic heart disease of native coronary artery without angina pectoris: Secondary | ICD-10-CM | POA: Diagnosis not present

## 2021-10-03 DIAGNOSIS — E782 Mixed hyperlipidemia: Secondary | ICD-10-CM | POA: Diagnosis not present

## 2021-10-03 DIAGNOSIS — I350 Nonrheumatic aortic (valve) stenosis: Secondary | ICD-10-CM

## 2021-10-03 MED ORDER — METOPROLOL TARTRATE 25 MG PO TABS: 12.5000 mg | ORAL_TABLET | Freq: Two times a day (BID) | ORAL | 3 refills | Status: DC

## 2021-10-03 NOTE — Progress Notes (Signed)
Clinical Summary Karina Green is a 65 y.o.femaleseen for the following medical problems.   1. CAD - hx of CABG Nov 2015 (LIMA-LAD,SVG-OM) - 10/2014 echo LVEF 60-65%, abnormal diastolic function indeterminant grade,       09/2017 echo LVEF 60-65%, no WMAs, grade I diastolic dysfunction, mild AS 10/2017 nuclear stress: low risk, gut artifact vs small basal inferolateral defect with mild reversibility     - occasional chest pain, occasional SOB - pressure like pain midchest. Tends to come on activity and stress. 5/10 in severity. +SOB. Lasts a few seconds, resolves with rest. Occurs few times a week.       2. HTN - she is compliant, has not taken yet today however -      4. Hyperlipidemia - compliant with statin, labs followed by pcp - not able to afford zetia.    4. Aortic stenosis  - 09/2017 echo mean grad 16, AVA VTI 1.66 consistent with mild AS  03/2020 echo: LVEF 60-65%, moderate AS mean grad 21, AVA VTI 1.10  5. Palpitations - 06/2019 event monitor was benign - reported side effects on metoprolo,, stopped taking.  - taking metoprolol 25mg  just once a day.  - has cut out caffeine, symptoms have improves Past Medical History:  Diagnosis Date   Arthritis    Back pain    CAD (coronary artery disease), native coronary artery-70% LM stenosis 10/25/2014   Cough last 30 yrs    nonproductive, no fevers   DM type 2 (diabetes mellitus, type 2) ([place]) 10/25/14   Gout    Hyperlipidemia    Hypertension      No Known Allergies   Current Outpatient Medications  Medication Sig Dispense Refill   allopurinol (ZYLOPRIM) 100 MG tablet Take 1 tablet by mouth daily.     aspirin EC 81 MG tablet Take 81 mg by mouth 2 (two) times daily.     atorvastatin (LIPITOR) 80 MG tablet TAKE ONE TABLET BY MOUTH ONCE DAILY AT 6 P.M. 90 tablet 01   colchicine 0.6 MG tablet Take 1 tablet by mouth daily as needed.     ezetimibe (ZETIA) 10 MG tablet Take 1 tablet (10 mg total) by mouth daily. 90  tablet 3   furosemide (LASIX) 20 MG tablet Take 1 tablet (20 mg total) by mouth daily. 90 tablet 1   isosorbide mononitrate (IMDUR) 30 MG 24 hr tablet Take 0.5 tablets (15 mg total) by mouth daily. 45 tablet 3   losartan (COZAAR) 25 MG tablet Take 0.5 tablets (12.5 mg total) daily by mouth. 45 tablet 3   metoprolol tartrate (LOPRESSOR) 25 MG tablet Take 1 tablet (25 mg total) by mouth 2 (two) times daily. 180 tablet 1   Multiple Vitamin (MULTIVITAMIN WITH MINERALS) TABS tablet Take 1 tablet by mouth daily.     Omega-3 Fatty Acids (FISH OIL) 1000 MG CAPS Take 1 capsule by mouth daily.     potassium chloride SA (K-DUR,KLOR-CON) 20 MEQ tablet Take 1 tablet (20 mEq total) by mouth daily. NEED OV. 90 tablet 0   No current facility-administered medications for this visit.     Past Surgical History:  Procedure Laterality Date   ABDOMINAL HYSTERECTOMY     complete   APPENDECTOMY     bladder tack     CARDIAC CATHETERIZATION  10/25/14   70% LM stensis, normal EF   CHOLECYSTECTOMY     CORONARY ARTERY BYPASS GRAFT N/A 10/29/2014   Procedure: CORONARY ARTERY BYPASS GRAFTING (CABG) TIMES  TWO USING LEFT INTERNAL MAMMARY ARTERY AND RIGHT LEG GREATER SAPHENOUS VEIN HARVESTED ENDOSCOPICALLY.;  Surgeon: Ivin Poot, MD;  Location: [facility];  Service: Open Heart Surgery;  Laterality: N/A;   HERNIA REPAIR     INTRAOPERATIVE TRANSESOPHAGEAL ECHOCARDIOGRAM N/A 10/29/2014   Procedure: INTRAOPERATIVE TRANSESOPHAGEAL ECHOCARDIOGRAM;  Surgeon: Ivin Poot, MD;  Location: [place];  Service: Open Heart Surgery;  Laterality: N/A;   JOINT REPLACEMENT Right 2009   hip total   LEFT HEART CATHETERIZATION WITH CORONARY ANGIOGRAM N/A 10/26/2014   Procedure: LEFT HEART CATHETERIZATION WITH CORONARY ANGIOGRAM;  Surgeon: Troy Sine, MD;  Location: [facility] CATH [facility];  Service: Cardiovascular;  Laterality: N/A;   TONSILLECTOMY     TOTAL HIP ARTHROPLASTY     TOTAL HIP ARTHROPLASTY Left 05/07/2016   Procedure: TOTAL HIP  ARTHROPLASTY ANTERIOR APPROACH;  Surgeon: Rod Can, MD;  Location: [facility];  Service: Orthopedics;  Laterality: Left;     No Known Allergies    Family History  Problem Relation Age of Onset   Hypertension Mother    Coronary artery disease Mother        CABG   Hypertension Father    CAD Father        CABG   Coronary artery disease Brother        Stent     Social History Karina Green reports that she has never smoked. She has never used smokeless tobacco. Karina Green reports no history of alcohol use.   Review of Systems CONSTITUTIONAL: No weight loss, fever, chills, weakness or fatigue.  HEENT: Eyes: No visual loss, blurred vision, double vision or yellow sclerae.No hearing loss, sneezing, congestion, runny nose or sore throat.  SKIN: No rash or itching.  CARDIOVASCULAR: per hpi RESPIRATORY: No shortness of breath, cough or sputum.  GASTROINTESTINAL: No anorexia, nausea, vomiting or diarrhea. No abdominal pain or blood.  GENITOURINARY: No burning on urination, no polyuria NEUROLOGICAL: No headache, dizziness, syncope, paralysis, ataxia, numbness or tingling in the extremities. No change in bowel or bladder control.  MUSCULOSKELETAL: No muscle, back pain, joint pain or stiffness.  LYMPHATICS: No enlarged nodes. No history of splenectomy.  PSYCHIATRIC: No history of depression or anxiety.  ENDOCRINOLOGIC: No reports of sweating, cold or heat intolerance. No polyuria or polydipsia.  Marland Kitchen   Physical Examination Today's Vitals   10/03/21 0836  BP: (!) 162/80  Pulse: 67  SpO2: 96%  Weight: 233 lb 9.6 oz (106 kg)  Height: 5\' 3"  (1.6 m)   Body mass index is 41.38 kg/m.  Gen: resting comfortably, no acute distress HEENT: no scleral icterus, pupils equal round and reactive, no palptable cervical adenopathy,  CV: RRR, no m/r/g no jvd Resp: Clear to auscultation bilaterally GI: abdomen is soft, non-tender, non-distended, normal bowel sounds, no hepatosplenomegaly MSK:  extremities are warm, no edema.  Skin: warm, no rash Neuro:  no focal deficits Psych: appropriate affect   Diagnostic Studies  10/2014 Cath HEMODYNAMICS:    Central Aorta: 140/66   Left Ventricle: 140/18   ANGIOGRAPHY:    The left main coronary artery was a large-caliber vessel which bifurcated into the LAD and left circumflex coronary artery.  There was a 70% distal left main stenosis prior to its bifurcation with a suggestion of a focal napkin ring like appearance. This did not improve following IC nitroglycerin administration.   The LAD was angiographically normal and gave rise to 2 major diagonal vessels and several septal perforating arteries. The vessel extended to the LV apex.  The left circumflex coronary artery was angiographically normal and gave rise to one major bifurcating obtuse marginal Carmel Waddington.    The RCA was angiographically normal it gave rise to a  PDA and PLA vessel.    Left ventriculography revealed normal global LV contractility without focal segmental wall motion abnormalities. There was no evidence for mitral regurgitation. Ejection fraction was 60%.     Total contrast used: 85 cc Omnipaque   IMPRESSION:   Significant coronary obstructive disease with 70% distal left main stenosis.   Normal LV function.     RECOMMENDATION:   Surgical consultation for consideration for CABG.   10/2014 Echo Study Conclusions  - Left ventricle: The cavity size was normal. Wall thickness was   increased in a pattern of mild LVH. Systolic function was normal.   The estimated ejection fraction was in the range of 60% to 65%.   Diastolic function is abnormal, indeterminant grade. Wall motion   was normal; there were no regional wall motion abnormalities. - Aortic valve: Moderately calcified annulus. Trileaflet; mildly   thickened leaflets. There is aortic sclerosis without significant   stenosis. There was trivial regurgitation. Valve area (VTI): 1.79   cm^2.  Valve area (Vmax): 1.82 cm^2. - Mitral valve: Mildly calcified annulus. Mildly thickened leaflets   . - Atrial septum: No defect or patent foramen ovale was identified. - Technically adequate study.  10/2014 Carotid US Summary:  - The vertebral arteries appear patent with antegrade flow. - Findings consistent with 1- 39 percent stenosis involving the   right internal carotid artery and the left internal carotid   artery. - ICA/CCA ratio. right = 0.81. left =1.36. - Palpable pedal pulses.   05/2015 echo Study Conclusions   - Left ventricle: The cavity size was normal. Wall thickness was   increased in a pattern of moderate LVH. Systolic function was   normal. The estimated ejection fraction was in the range of 60%   to 65%. Wall motion was normal; there were no regional wall   motion abnormalities. Doppler parameters are consistent with   abnormal left ventricular relaxation (grade 1 diastolic   dysfunction). - Aortic valve: Trileaflet; moderately calcified leaflets. Left   coronary cusp mobility was restricted. There was mild stenosis.   Mean gradient (S): 16 mm Hg. Peak gradient (S): 27 mm Hg. VTI   ratio of LVOT to aortic valve: 0.48. Valve area (VTI): 1.5 cm^2. - Mitral valve: Calcified annulus. There was trivial regurgitation. - Right atrium: Central venous pressure (est): 3 mm Hg. - Atrial septum: No defect or patent foramen ovale was identified. - Tricuspid valve: There was trivial regurgitation. - Pulmonary arteries: Systolic pressure could not be accurately   estimated. - Pericardium, extracardiac: There was no pericardial effusion.   Impressions:   - Moderate LVH with LVEF 60-65% and grade 1 diastolic dysfunction.   Mild calcific aortic stenosis as outlined. Trivial tricuspid   regurgitation, unable to assess PASP.     03/2016 Carotid US Mild bilateral disease     10/2017 nuclear stress There was no ST segment deviation noted during stress. This is a low risk  study. The left ventricular ejection fraction is normal (55-65%). Small basal inferolateral defect with mild reversibility. This may represent a small prior infarct with mild ischemia, however there is significant adjacent radiotracer uptake which may affect the defect. Either finding would support low risk     09/2017 echo Study Conclusions   - Left ventricle: The cavity size was normal. Wall thickness  was   increased in a pattern of mild LVH. Systolic function was normal.   The estimated ejection fraction was in the range of 60% to 65%.   Wall motion was normal; there were no regional wall motion   abnormalities. Doppler parameters are consistent with abnormal   left ventricular relaxation (grade 1 diastolic dysfunction). - Aortic valve: There was mild stenosis. Mean gradient (S): 16 mm   Hg. Mean gradient of 19 by pedhoff probe. Valve area (VTI): 1.8   cm^2. Valve area (Vmax): 1.81 cm^2. Valve area (Vmean): 1.66   cm^2. - Mitral valve: Mildly calcified annulus. Normal thickness leaflets   . - Technically adequate study.   06/2019 event monitor  7 day event monitor Min HR 49, Max HR 120, average HR 73. Min HR occurred in early AM hours presumably while sleeping No symptoms reported Telemetry tracings show normal sinus rhythm   Assessment and Plan   1. CAD -recent chest pain, DOE - will obtain lexiscan to further evaluate - EKG today shows SR, chronic ST/T changes   2. HTN -elevated today but has not taken meds yet - she will call with home bp's. Room to titrate losartan if needed   3. Hyperlipidemia - zetia was expensive, remains on atorva 80 - f/u pcp labs, could try switching to crestor if not at goal.     4. Aortic stenosis - with recent chest pain and DOE repeat echo, from last study she had moderate AS.      Arnoldo Lenis, M.D.

## 2021-10-03 NOTE — Addendum Note (Signed)
Addended by: Christella Scheuermann C on: 10/03/2021 09:12 AM   Modules accepted: Orders

## 2021-10-03 NOTE — Patient Instructions (Signed)
Medication Instructions:  Your physician has recommended you make the following change in your medication:  START Lopressor 12.5 mg tablets twice daily  *If you need a refill on your cardiac medications before your next appointment, please call your pharmacy*   Lab Work: We have requested your most recent labwork from your primary care provider If you have labs (blood work) drawn today and your tests are completely normal, you will receive your results only by: [place] (if you have MyChart) OR A paper copy in the mail If you have any lab test that is abnormal or we need to change your treatment, we will call you to review the results.   Testing/Procedures: Your physician has requested that you have a lexiscan myoview. For further information please visit [email or web address]. Please follow instruction sheet, as given.  Your physician has requested that you have an echocardiogram. Echocardiography is a painless test that uses sound waves to create images of your heart. It provides your doctor with information about the size and shape of your heart and how well your heart's chambers and valves are working. This procedure takes approximately one hour. There are no restrictions for this procedure.    Follow-Up: At [facility], you and your health needs are our priority.  As part of our continuing mission to provide you with exceptional heart care, we have created designated Provider Care Teams.  These Care Teams include your primary Cardiologist (physician) and Advanced Practice Providers (APPs -  Physician Assistants and Nurse Practitioners) who all work together to provide you with the care you need, when you need it.  We recommend signing up for the patient portal called "MyChart".  Sign up information is provided on this After Visit Summary.  MyChart is used to connect with patients for Virtual Visits (Telemedicine).  Patients are able to view lab/test results, encounter notes,  upcoming appointments, etc.  Non-urgent messages can be sent to your provider as well.   To learn more about what you can do with MyChart, go to [email or web address].    Your next appointment:   6 week(s)  The format for your next appointment:   In Person  Provider:   Katina Dung, NP   Other Instructions Call Monday with blood pressure update

## 2021-10-10 ENCOUNTER — Ambulatory Visit (HOSPITAL_COMMUNITY)
Admission: RE | Admit: 2021-10-10 | Discharge: 2021-10-10 | Disposition: A | Discharge status: Home / Self Care | Payer: Medicare HMO | Source: Ambulatory Visit | Attending: Cardiology | Admitting: Cardiology

## 2021-10-10 ENCOUNTER — Encounter (HOSPITAL_COMMUNITY)
Admission: RE | Admit: 2021-10-10 | Discharge: 2021-10-10 | Disposition: A | Discharge status: Home / Self Care | Payer: Medicare HMO | Source: Ambulatory Visit | Attending: Cardiology | Admitting: Cardiology

## 2021-10-10 ENCOUNTER — Other Ambulatory Visit: Payer: Self-pay

## 2021-10-10 ENCOUNTER — Encounter (HOSPITAL_COMMUNITY): Payer: Self-pay

## 2021-10-10 DIAGNOSIS — R079 Chest pain, unspecified: Secondary | ICD-10-CM | POA: Insufficient documentation

## 2021-10-10 HISTORY — DX: Heart failure, unspecified: I50.9

## 2021-10-10 LAB — NM MYOCAR MULTI W/SPECT W/WALL MOTION / EF
LV dias vol: 87 mL (ref 46–106)
LV sys vol: 36 mL
Nuc Stress EF: 58 %
Peak HR: 83 {beats}/min
RATE: 0.6
Rest HR: 62 {beats}/min
Rest Nuclear Isotope Dose: 11 mCi
SDS: 2
SRS: 1
SSS: 3
ST Depression (mm): 0 mm
Stress Nuclear Isotope Dose: 31.8 mCi
TID: 1.06

## 2021-10-10 MED ORDER — TECHNETIUM TC 99M TETROFOSMIN IV KIT
10.0000 | PACK | Freq: Once | INTRAVENOUS | Status: AC | PRN
  Administered 2021-10-10: 11 via INTRAVENOUS

## 2021-10-10 MED ORDER — TECHNETIUM TC 99M TETROFOSMIN IV KIT
30.0000 | PACK | Freq: Once | INTRAVENOUS | Status: AC | PRN
  Administered 2021-10-10: 31.8 via INTRAVENOUS

## 2021-10-10 MED ORDER — SODIUM CHLORIDE FLUSH 0.9 % IV SOLN
INTRAVENOUS | Status: AC
  Filled 2021-10-10: qty 10

## 2021-10-10 MED ORDER — REGADENOSON 0.4 MG/5ML IV SOLN
INTRAVENOUS | Status: AC
  Administered 2021-10-10: 0.4 mg via INTRAVENOUS
  Filled 2021-10-10: qty 5

## 2021-10-22 ENCOUNTER — Telehealth: Payer: Self-pay | Admitting: Cardiology

## 2021-10-22 NOTE — Telephone Encounter (Signed)
Karina Green is calling requesting her Lexiscan results.

## 2021-10-22 NOTE — Telephone Encounter (Signed)
Looks like she had stress test done 10/10/2021.  No disposition attached.

## 2021-10-24 NOTE — Telephone Encounter (Signed)
Laurine Blazer, LPN  [id number][id number]  6:29 AM EDT Back to Top    Notified, copy to pcp.    Arnoldo Lenis, MD  10/24/2021  8:26 AM EDT     Stress test looks fine, no evidence of any significant blockages in the heart. Would discuss noncardiac causes of symptoms with her pcp, f/u with [place] 3 months   Zandra Abts MD   Follow up already scheduled for 11/12/2021 with Katina Dung, NP.

## 2021-11-05 DIAGNOSIS — M1612 Unilateral primary osteoarthritis, left hip: Secondary | ICD-10-CM | POA: Diagnosis not present

## 2021-11-05 DIAGNOSIS — E782 Mixed hyperlipidemia: Secondary | ICD-10-CM | POA: Diagnosis not present

## 2021-11-05 DIAGNOSIS — I2510 Atherosclerotic heart disease of native coronary artery without angina pectoris: Secondary | ICD-10-CM | POA: Diagnosis not present

## 2021-11-05 DIAGNOSIS — R69 Illness, unspecified: Secondary | ICD-10-CM | POA: Diagnosis not present

## 2021-11-05 DIAGNOSIS — E6609 Other obesity due to excess calories: Secondary | ICD-10-CM | POA: Diagnosis not present

## 2021-11-05 DIAGNOSIS — E7849 Other hyperlipidemia: Secondary | ICD-10-CM | POA: Diagnosis not present

## 2021-11-05 DIAGNOSIS — E119 Type 2 diabetes mellitus without complications: Secondary | ICD-10-CM | POA: Diagnosis not present

## 2021-11-05 DIAGNOSIS — I10 Essential (primary) hypertension: Secondary | ICD-10-CM | POA: Diagnosis not present

## 2021-11-05 DIAGNOSIS — Z23 Encounter for immunization: Secondary | ICD-10-CM | POA: Diagnosis not present

## 2021-11-05 DIAGNOSIS — Z6841 Body Mass Index (BMI) 40.0 and over, adult: Secondary | ICD-10-CM | POA: Diagnosis not present

## 2021-11-05 DIAGNOSIS — I5032 Chronic diastolic (congestive) heart failure: Secondary | ICD-10-CM | POA: Diagnosis not present

## 2021-11-05 DIAGNOSIS — Z1331 Encounter for screening for depression: Secondary | ICD-10-CM | POA: Diagnosis not present

## 2021-11-05 DIAGNOSIS — F419 Anxiety disorder, unspecified: Secondary | ICD-10-CM | POA: Diagnosis not present

## 2021-11-05 DIAGNOSIS — M109 Gout, unspecified: Secondary | ICD-10-CM | POA: Diagnosis not present

## 2021-11-11 ENCOUNTER — Telehealth: Payer: Self-pay | Admitting: Internal Medicine

## 2021-11-11 NOTE — Telephone Encounter (Signed)
Appointment cancelled for tomorrow - needs to have echo scheduled first.   Patient aware.

## 2021-11-11 NOTE — Telephone Encounter (Signed)
Patient was returning call. Please advise ?

## 2021-11-12 ENCOUNTER — Ambulatory Visit: Payer: Medicare HMO | Admitting: Family Medicine

## 2021-11-18 ENCOUNTER — Ambulatory Visit: Payer: Medicare HMO | Admitting: Family Medicine

## 2021-12-08 ENCOUNTER — Ambulatory Visit (INDEPENDENT_AMBULATORY_CARE_PROVIDER_SITE_OTHER): Payer: Medicare HMO

## 2021-12-08 DIAGNOSIS — I350 Nonrheumatic aortic (valve) stenosis: Secondary | ICD-10-CM

## 2021-12-08 LAB — ECHOCARDIOGRAM COMPLETE
AR max vel: 0.76 cm2
AV Area VTI: 0.71 cm2
AV Area mean vel: 0.71 cm2
AV Mean grad: 28 mmHg
AV Peak grad: 43.3 mmHg
Ao pk vel: 3.29 m/s
Area-P 1/2: 1.96 cm2
Calc EF: 66.3 %
MV M vel: 4.38 m/s
MV Peak grad: 76.7 mmHg
P 1/2 time: 469 msec
S' Lateral: 3.63 cm
Single Plane A2C EF: 69.2 %
Single Plane A4C EF: 65.7 %

## 2021-12-12 ENCOUNTER — Telehealth: Payer: Self-pay | Admitting: *Deleted

## 2021-12-12 NOTE — Telephone Encounter (Signed)
-----   Message from Merlene Laughter, RN sent at 12/11/2021  4:25 PM EST -----  ----- Message ----- From: Arnoldo Lenis, MD Sent: 12/11/2021   4:24 PM EST To: Merlene Laughter, RN  Her aortic heart valve has become more stiff and now is in the moderate to severe range much closer to the severe end of the spectrum and very likely is the cause of her symptoms. I think she is at the point we need to consider valve replacement. Could we have a virtual appt Dec 27 at [facility] (open slot at this time) to discuss in more detail and discuss some of the next steps  Zandra Abts MD

## 2021-12-12 NOTE — Telephone Encounter (Signed)
Laurine Blazer, LPN  51/98/2429 98:06 PM EST Back to Top    Notified, copy to pcp.

## 2021-12-19 NOTE — Telephone Encounter (Signed)
Patient states she never got a call back to be work into dr branch schedule base off of her echo results. Please advise

## 2021-12-19 NOTE — Telephone Encounter (Signed)
Left message to return call 

## 2021-12-19 NOTE — Telephone Encounter (Signed)
Initially sent staff message to provider to see where to add to his schedule.

## 2021-12-23 NOTE — Telephone Encounter (Signed)
Left message to return call on home number.  Voice mail full on mobile.

## 2021-12-23 NOTE — Telephone Encounter (Signed)
Patient notified and verbalized understanding.   The patient verbally consented for a telehealth phone visit with [facility] and understands that his/her insurance company will be billed for the encounter.

## 2021-12-24 ENCOUNTER — Other Ambulatory Visit: Payer: Self-pay | Admitting: Cardiology

## 2021-12-24 ENCOUNTER — Telehealth: Payer: Self-pay | Admitting: Cardiology

## 2021-12-24 ENCOUNTER — Encounter: Payer: Self-pay | Admitting: Cardiology

## 2021-12-24 ENCOUNTER — Telehealth: Payer: Self-pay

## 2021-12-24 ENCOUNTER — Ambulatory Visit (INDEPENDENT_AMBULATORY_CARE_PROVIDER_SITE_OTHER): Payer: Medicare HMO | Admitting: Cardiology

## 2021-12-24 ENCOUNTER — Telehealth: Payer: Medicare HMO | Admitting: Cardiology

## 2021-12-24 ENCOUNTER — Other Ambulatory Visit: Payer: Self-pay

## 2021-12-24 VITALS — BP 137/64 | HR 61 | Ht 63.0 in

## 2021-12-24 DIAGNOSIS — I350 Nonrheumatic aortic (valve) stenosis: Secondary | ICD-10-CM | POA: Diagnosis not present

## 2021-12-24 DIAGNOSIS — Z01818 Encounter for other preprocedural examination: Secondary | ICD-10-CM

## 2021-12-24 DIAGNOSIS — I2510 Atherosclerotic heart disease of native coronary artery without angina pectoris: Secondary | ICD-10-CM | POA: Diagnosis not present

## 2021-12-24 MED ORDER — SODIUM CHLORIDE 0.9% FLUSH: 3.0000 mL | Freq: Two times a day (BID) | INTRAVENOUS | Status: DC

## 2021-12-24 NOTE — Progress Notes (Signed)
Virtual Visit via Telephone Note   This visit type was conducted due to national recommendations for restrictions regarding the COVID-19 Pandemic (e.g. social distancing) in an effort to limit this patient's exposure and mitigate transmission in our community.  Due to her co-morbid illnesses, this patient is at least at moderate risk for complications without adequate follow up.  This format is felt to be most appropriate for this patient at this time.  The patient did not have access to video technology/had technical difficulties with video requiring transitioning to audio format only (telephone).  All issues noted in this document were discussed and addressed.  No physical exam could be performed with this format.  Please refer to the patient's chart for her  consent to telehealth for [facility]    Date:  12/24/2021   ID:  Karina Green, DOB [birth year], MRN [id number] The patient was identified using 2 identifiers.  Patient Location: Home Provider Location: Office/Clinic   PCP:  [facility], [facility] Cardiologist:  Carlyle Dolly, MD     Evaluation Performed:  Follow-Up Visit  Chief Complaint:  Follow up visit  History of Present Illness:    Karina Green is a 66 y.o. female seen today for follow up of the following medical problems.   1. CAD - hx of CABG Nov 2015 (LIMA-LAD,SVG-OM) - 10/2014 echo LVEF 60-65%, abnormal diastolic function indeterminant grade,       09/2017 echo LVEF 60-65%, no WMAs, grade I diastolic dysfunction, mild AS 10/2017 nuclear stress: low risk, gut artifact vs small basal inferolateral defect with mild reversibility       09/2021 nuclear stress; no clear ischemia, likely variable soft tissue attenuation anterolateral wall.  - occasinal chest pains since last visit, not specifically exertional.      2. HTN -compliant with meds     3. Hyperlipidemia - compliant with statin, labs followed by pcp -  not able to afford zetia.    4. Aortic stenosis  - 09/2017 echo mean grad 16, AVA VTI 1.66 consistent with mild AS   03/2020 echo: LVEF 60-65%, moderate AS mean grad 21, AVA VTI 1.10   At last visit she reported progressing DOE. We repeated her echo.  11/2021 echo: LVEF 55-60%, grade II dd, mod to severe AS mean gradient 28, AVA VTI 0.71, DI 0.25 SVI 33 which is mildly decreased and may explain to some degree lower than expected gradient. - I reviewed the study. Morphologically the valve appears severe. The continous wave Doppler gradients in the apical 5 chamber views appear off axis and likely are underestimated. The CW Doppler just distal to the aortic valve in the proximal aorta in the suprasternal view shows a mean gradient of 42 mmHg       5. Palpitations - 06/2019 event monitor was benign - reported side effects on metoprolo,, stopped taking.  - taking metoprolol 25mg  just once a day.  - symptoms improved with weaning of caffeine    The patient does not have symptoms concerning for COVID-19 infection (fever, chills, cough, or new shortness of breath).    Past Medical History:  Diagnosis Date   Arthritis    Back pain    CAD (coronary artery disease), native coronary artery-70% LM stenosis 10/25/2014   CHF (congestive heart failure) (HCC)    Cough last 30 yrs    nonproductive, no fevers   DM type 2 (diabetes mellitus, type 2) ([place]) 10/25/2014   Gout  Hyperlipidemia    Hypertension    Past Surgical History:  Procedure Laterality Date   ABDOMINAL HYSTERECTOMY     complete   APPENDECTOMY     bladder tack     CARDIAC CATHETERIZATION  10/25/14   70% LM stensis, normal EF   CHOLECYSTECTOMY     CORONARY ARTERY BYPASS GRAFT N/A 10/29/2014   Procedure: CORONARY ARTERY BYPASS GRAFTING (CABG) TIMES TWO USING LEFT INTERNAL MAMMARY ARTERY AND RIGHT LEG GREATER SAPHENOUS VEIN HARVESTED ENDOSCOPICALLY.;  Surgeon: Ivin Poot, MD;  Location: [facility];  Service: Open Heart  Surgery;  Laterality: N/A;   HERNIA REPAIR     INTRAOPERATIVE TRANSESOPHAGEAL ECHOCARDIOGRAM N/A 10/29/2014   Procedure: INTRAOPERATIVE TRANSESOPHAGEAL ECHOCARDIOGRAM;  Surgeon: Ivin Poot, MD;  Location: [facility];  Service: Open Heart Surgery;  Laterality: N/A;   JOINT REPLACEMENT Right 2009   hip total   LEFT HEART CATHETERIZATION WITH CORONARY ANGIOGRAM N/A 10/26/2014   Procedure: LEFT HEART CATHETERIZATION WITH CORONARY ANGIOGRAM;  Surgeon: Troy Sine, MD;  Location: [facility] CATH [facility];  Service: Cardiovascular;  Laterality: N/A;   TONSILLECTOMY     TOTAL HIP ARTHROPLASTY     TOTAL HIP ARTHROPLASTY Left 05/07/2016   Procedure: TOTAL HIP ARTHROPLASTY ANTERIOR APPROACH;  Surgeon: Rod Can, MD;  Location: [facility];  Service: Orthopedics;  Laterality: Left;     Current Meds  Medication Sig   allopurinol (ZYLOPRIM) 100 MG tablet Take 1 tablet by mouth daily.   aspirin EC 81 MG tablet Take 81 mg by mouth 2 (two) times daily.   atorvastatin (LIPITOR) 80 MG tablet TAKE ONE TABLET BY MOUTH ONCE DAILY AT 6 P.M.   furosemide (LASIX) 20 MG tablet Take 1 tablet (20 mg total) by mouth daily.   isosorbide mononitrate (IMDUR) 30 MG 24 hr tablet Take 0.5 tablets (15 mg total) by mouth daily.   losartan (COZAAR) 25 MG tablet Take 0.5 tablets (12.5 mg total) daily by mouth.   metoprolol tartrate (LOPRESSOR) 25 MG tablet Take 0.5 tablets (12.5 mg total) by mouth 2 (two) times daily. (Patient taking differently: Take 25 mg by mouth 2 (two) times daily.)   Multiple Vitamin (MULTIVITAMIN WITH MINERALS) TABS tablet Take 1 tablet by mouth daily.   Omega-3 Fatty Acids (FISH OIL) 1000 MG CAPS Take 1 capsule by mouth daily.   potassium chloride SA (K-DUR,KLOR-CON) 20 MEQ tablet Take 1 tablet (20 mEq total) by mouth daily. NEED OV.     Allergies:   Patient has no known allergies.   Social History   Tobacco Use   Smoking status: Never   Smokeless tobacco: Never  Vaping Use   Vaping Use: Never used   Substance Use Topics   Alcohol use: No    Alcohol/week: 0.0 standard drinks   Drug use: No     Family Hx: The patient's family history includes CAD in her father; Coronary artery disease in her brother and mother; Hypertension in her father and mother.  ROS:   Please see the history of present illness.     All other systems reviewed and are negative.   Prior CV studies:   The following studies were reviewed today:  10/2014 Cath HEMODYNAMICS:    Central Aorta: 140/66   Left Ventricle: 140/18   ANGIOGRAPHY:    The left main coronary artery was a large-caliber vessel which bifurcated into the LAD and left circumflex coronary artery.  There was a 70% distal left main stenosis prior to its bifurcation with a suggestion of a  focal napkin ring like appearance. This did not improve following IC nitroglycerin administration.   The LAD was angiographically normal and gave rise to 2 major diagonal vessels and several septal perforating arteries. The vessel extended to the LV apex.    The left circumflex coronary artery was angiographically normal and gave rise to one major bifurcating obtuse marginal Aunesty Tyson.    The RCA was angiographically normal it gave rise to a  PDA and PLA vessel.    Left ventriculography revealed normal global LV contractility without focal segmental wall motion abnormalities. There was no evidence for mitral regurgitation. Ejection fraction was 60%.     Total contrast used: 85 cc Omnipaque   IMPRESSION:   Significant coronary obstructive disease with 70% distal left main stenosis.   Normal LV function.     RECOMMENDATION:   Surgical consultation for consideration for CABG.   10/2014 Echo Study Conclusions  - Left ventricle: The cavity size was normal. Wall thickness was   increased in a pattern of mild LVH. Systolic function was normal.   The estimated ejection fraction was in the range of 60% to 65%.   Diastolic function is abnormal,  indeterminant grade. Wall motion   was normal; there were no regional wall motion abnormalities. - Aortic valve: Moderately calcified annulus. Trileaflet; mildly   thickened leaflets. There is aortic sclerosis without significant   stenosis. There was trivial regurgitation. Valve area (VTI): 1.79   cm^2. Valve area (Vmax): 1.82 cm^2. - Mitral valve: Mildly calcified annulus. Mildly thickened leaflets   . - Atrial septum: No defect or patent foramen ovale was identified. - Technically adequate study.  10/2014 Carotid US Summary:  - The vertebral arteries appear patent with antegrade flow. - Findings consistent with 1- 39 percent stenosis involving the   right internal carotid artery and the left internal carotid   artery. - ICA/CCA ratio. right = 0.81. left =1.36. - Palpable pedal pulses.   05/2015 echo Study Conclusions   - Left ventricle: The cavity size was normal. Wall thickness was   increased in a pattern of moderate LVH. Systolic function was   normal. The estimated ejection fraction was in the range of 60%   to 65%. Wall motion was normal; there were no regional wall   motion abnormalities. Doppler parameters are consistent with   abnormal left ventricular relaxation (grade 1 diastolic   dysfunction). - Aortic valve: Trileaflet; moderately calcified leaflets. Left   coronary cusp mobility was restricted. There was mild stenosis.   Mean gradient (S): 16 mm Hg. Peak gradient (S): 27 mm Hg. VTI   ratio of LVOT to aortic valve: 0.48. Valve area (VTI): 1.5 cm^2. - Mitral valve: Calcified annulus. There was trivial regurgitation. - Right atrium: Central venous pressure (est): 3 mm Hg. - Atrial septum: No defect or patent foramen ovale was identified. - Tricuspid valve: There was trivial regurgitation. - Pulmonary arteries: Systolic pressure could not be accurately   estimated. - Pericardium, extracardiac: There was no pericardial effusion.   Impressions:   - Moderate LVH  with LVEF 60-65% and grade 1 diastolic dysfunction.   Mild calcific aortic stenosis as outlined. Trivial tricuspid   regurgitation, unable to assess PASP.     03/2016 Carotid US Mild bilateral disease     10/2017 nuclear stress There was no ST segment deviation noted during stress. This is a low risk study. The left ventricular ejection fraction is normal (55-65%). Small basal inferolateral defect with mild reversibility. This may represent a small  prior infarct with mild ischemia, however there is significant adjacent radiotracer uptake which may affect the defect. Either finding would support low risk     09/2017 echo Study Conclusions   - Left ventricle: The cavity size was normal. Wall thickness was   increased in a pattern of mild LVH. Systolic function was normal.   The estimated ejection fraction was in the range of 60% to 65%.   Wall motion was normal; there were no regional wall motion   abnormalities. Doppler parameters are consistent with abnormal   left ventricular relaxation (grade 1 diastolic dysfunction). - Aortic valve: There was mild stenosis. Mean gradient (S): 16 mm   Hg. Mean gradient of 19 by pedhoff probe. Valve area (VTI): 1.8   cm^2. Valve area (Vmax): 1.81 cm^2. Valve area (Vmean): 1.66   cm^2. - Mitral valve: Mildly calcified annulus. Normal thickness leaflets   . - Technically adequate study.     06/2019 event monitor   7 day event monitor Min HR 49, Max HR 120, average HR 73. Min HR occurred in early AM hours presumably while sleeping No symptoms reported Telemetry tracings show normal sinus rhythm  09/2021 nuclear stress   Findings are consistent with no ischemia. The study is low risk.   No ST deviation was noted. The ECG was negative for ischemia.   LV perfusion is equivocal.  Moderate sized, mild intensity, apical to basal anterolateral defect that is partially reversible at the base and most consistent with variable soft tissue attenuation  rather than ischemia.  Wall motion is normal.   Left ventricular function is normal. Nuclear stress EF: 58 %.   Low risk study with evidence of variable breast attenuation, no definite ischemia.  LVEF 58%.  Labs/Other Tests and Data Reviewed:    EKG:  No ECG reviewed.  Recent Labs: No results found for requested labs within last 8760 hours.   Recent Lipid Panel Lab Results  Component Value Date/Time   CHOL 177 10/25/2014 04:07 AM   TRIG 235 (H) 10/25/2014 04:07 AM   HDL 30 (L) 10/25/2014 04:07 AM   CHOLHDL 5.9 10/25/2014 04:07 AM   LDLCALC 100 (H) 10/25/2014 04:07 AM    Wt Readings from Last 3 Encounters:  10/03/21 233 lb 9.6 oz (106 kg)  05/30/20 232 lb 9.6 oz (105.5 kg)  03/21/20 231 lb 12.8 oz (105.1 kg)     Risk Assessment/Calculations:          Objective:    Vital Signs:  BP 137/64    Pulse 61    Ht 5\' 3"  (1.6 m)    BMI 41.38 kg/m    Normal affect. Normal speech pattern and tone. Comfortable, no apparent distress. No audible signs of sob or wheezing.   ASSESSMENT & PLAN:    1.Aortic stenosis - from most recent echo appears to have progressed to severe. I believe the gradients are underestimated based on angle of acquisition, there is a single Doppler gradient measured in the suprasternal view with a mean gradient of 42 mmHg. AVA VTI and DI support severe AS - progressive dyspnea over the last several months - I think she is at the point to consider valve replacement - will refer to [facility] with Dr Burt Knack or Angelena Form and also have her establish with valve clinic    2. CAD - - hx of CABG Nov 2015 (LIMA-LAD,SVG-OM) -recent chest pain, DOE - recent lexiscan showed probable artifact as opposed to ischemia - given consideration for valve interevention will  need cath to verify her coronary status.        Shared Decision Making/Informed Consent The risks [stroke (1 in 1000), death (1 in 1000), kidney failure [usually temporary] (1 in 500), bleeding (1 in 200),  allergic reaction [possibly serious] (1 in 200)], benefits (diagnostic support and management of coronary artery disease) and alternatives of a cardiac catheterization were discussed in detail with Ms. Belarus and she is willing to proceed.       COVID-19 Education: The signs and symptoms of COVID-19 were discussed with the patient and how to seek care for testing (follow up with PCP or arrange E-visit).  The importance of social distancing was discussed today.  Time:   Today, I have spent 19 minutes with the patient with telehealth technology discussing the above problems.     Medication Adjustments/Labs and Tests Ordered: Current medicines are reviewed at length with the patient today.  Concerns regarding medicines are outlined above.   Tests Ordered: No orders of the defined types were placed in this encounter.   Medication Changes: No orders of the defined types were placed in this encounter.   Follow Up:  In Person 2 months  Signed, Carlyle Dolly, MD  12/24/2021 1:23 PM    [facility]

## 2021-12-24 NOTE — Telephone Encounter (Signed)
Pt was seen today via Telehealth, pt is returning call to triage

## 2021-12-24 NOTE — Telephone Encounter (Signed)
Heart cath is scheduled

## 2021-12-24 NOTE — Addendum Note (Signed)
Addended by: Levonne Hubert on: 12/24/2021 02:17 PM   Modules accepted: Orders

## 2021-12-24 NOTE — Telephone Encounter (Signed)
°  Patient Consent for Virtual Visit        Karina Green has provided verbal consent on 12/24/2021 for a virtual visit (video or telephone).   CONSENT FOR VIRTUAL VISIT FOR:  Karina Green  By participating in this virtual visit I agree to the following:  I hereby voluntarily request, consent and authorize [facility] and its employed or contracted physicians, physician assistants, nurse practitioners or other licensed health care professionals (the Practitioner), to provide me with telemedicine health care services (the Services") as deemed necessary by the treating Practitioner. I acknowledge and consent to receive the Services by the Practitioner via telemedicine. I understand that the telemedicine visit will involve communicating with the Practitioner through live audiovisual communication technology and the disclosure of certain medical information by electronic transmission. I acknowledge that I have been given the opportunity to request an in-person assessment or other available alternative prior to the telemedicine visit and am voluntarily participating in the telemedicine visit.  I understand that I have the right to withhold or withdraw my consent to the use of telemedicine in the course of my care at any time, without affecting my right to future care or treatment, and that the Practitioner or I may terminate the telemedicine visit at any time. I understand that I have the right to inspect all information obtained and/or recorded in the course of the telemedicine visit and may receive copies of available information for a reasonable fee.  I understand that some of the potential risks of receiving the Services via telemedicine include:  Delay or interruption in medical evaluation due to technological equipment failure or disruption; Information transmitted may not be sufficient (e.g. poor resolution of images) to allow for appropriate medical decision making by the Practitioner; and/or   In rare instances, security protocols could fail, causing a breach of personal health information.  Furthermore, I acknowledge that it is my responsibility to provide information about my medical history, conditions and care that is complete and accurate to the best of my ability. I acknowledge that Practitioner's advice, recommendations, and/or decision may be based on factors not within their control, such as incomplete or inaccurate data provided by me or distortions of diagnostic images or specimens that may result from electronic transmissions. I understand that the practice of medicine is not an exact science and that Practitioner makes no warranties or guarantees regarding treatment outcomes. I acknowledge that a copy of this consent can be made available to me via my patient portal ([facility]), or I can request a printed copy by calling the office of [facility].    I understand that my insurance will be billed for this visit.   I have read or had this consent read to me. I understand the contents of this consent, which adequately explains the benefits and risks of the Services being provided via telemedicine.  I have been provided ample opportunity to ask questions regarding this consent and the Services and have had my questions answered to my satisfaction. I give my informed consent for the services to be provided through the use of telemedicine in my medical care

## 2021-12-24 NOTE — Patient Instructions (Signed)
Medication Instructions:   Your physician recommends that you continue on your current medications as directed. Please refer to the Current Medication list given to you today.  *If you need a refill on your cardiac medications before your next appointment, please call your pharmacy*   Lab Work: Your physician recommends that you return for lab work in: [place] 12/25/21  If you have labs (blood work) drawn today and your tests are completely normal, you will receive your results only by: [facility] (if you have MyChart) OR A paper copy in the mail If you have any lab test that is abnormal or we need to change your treatment, we will call you to review the results.   Testing/Procedures: Your physician has requested that you have a cardiac catheterization. Cardiac catheterization is used to diagnose and/or treat various heart conditions. Doctors may recommend this procedure for a number of different reasons. The most common reason is to evaluate chest pain. Chest pain can be a symptom of coronary artery disease (CAD), and cardiac catheterization can show whether plaque is narrowing or blocking your hearts arteries. This procedure is also used to evaluate the valves, as well as measure the blood flow and oxygen levels in different parts of your heart. For further information please visit [email or web address]. Please follow instruction sheet, as given.    Follow-Up: At [facility], you and your health needs are our priority.  As part of our continuing mission to provide you with exceptional heart care, we have created designated Provider Care Teams.  These Care Teams include your primary Cardiologist (physician) and Advanced Practice Providers (APPs -  Physician Assistants and Nurse Practitioners) who all work together to provide you with the care you need, when you need it.  We recommend signing up for the patient portal called "MyChart".  Sign up information is provided on this After  Visit Summary.  MyChart is used to connect with patients for Virtual Visits (Telemedicine).  Patients are able to view lab/test results, encounter notes, upcoming appointments, etc.  Non-urgent messages can be sent to your provider as well.   To learn more about what you can do with MyChart, go to [email or web address].    Your next appointment:   2 month(s)  The format for your next appointment:   In Person  Provider:   Carlyle Dolly, MD, Bernerd Pho, PA-C, or Ermalinda Barrios, PA-C    Other Instructions Thank you for choosing [facility]!    [address] [place] [address] Dept: [phone] Loc: [place]  12/24/2021  You are scheduled for a Cardiac Catheterization on Monday, January 9 with Dr. Sherren Mocha.  1. Please arrive at the [facility] (Main Entrance A) at [facility]: [address] [place], [place] [address] at 5:30 AM (This time is two hours before your procedure to ensure your preparation). Free valet parking service is available.   Special note: Every effort is made to have your procedure done on time. Please understand that emergencies sometimes delay scheduled procedures.  2. Diet: Do not eat solid foods after midnight.  The patient may have clear liquids until 5am upon the day of the procedure.  3. Labs: You will need to have blood drawn on Thursday, January 5 at [facility] [address], [place]  Open: 7am - 6pm, Sat 8am - 12 noon   Phone: [phone]. You do not need to be  fasting.  4. Medication instructions in preparation for your procedure:   Contrast Allergy: No  Do not take your Lasix the morning of your procedure   On the morning of your procedure, take your Aspirin and any morning medicines NOT listed above.  You may use sips of water.  5. Plan for one night stay--bring personal belongings. 6. Bring  a current list of your medications and current insurance cards. 7. You MUST have a responsible person to drive you home. 8. Someone MUST be with you the first 24 hours after you arrive home or your discharge will be delayed. 9. Please wear clothes that are easy to get on and off and wear slip-on shoes.  Thank you for allowing [place] to care for you!   -- [facility]

## 2021-12-24 NOTE — H&P (View-Only) (Signed)
Virtual Visit via Telephone Note   This visit type was conducted due to national recommendations for restrictions regarding the COVID-19 Pandemic (e.g. social distancing) in an effort to limit this patient's exposure and mitigate transmission in our community.  Due to her co-morbid illnesses, this patient is at least at moderate risk for complications without adequate follow up.  This format is felt to be most appropriate for this patient at this time.  The patient did not have access to video technology/had technical difficulties with video requiring transitioning to audio format only (telephone).  All issues noted in this document were discussed and addressed.  No physical exam could be performed with this format.  Please refer to the patient's chart for her  consent to telehealth for [facility].    Date:  12/24/2021   ID:  Karina Green, DOB [birth year], MRN [id number] The patient was identified using 2 identifiers.  Patient Location: Home Provider Location: Office/Clinic   PCP:  [facility], [facility] Cardiologist:  Carlyle Dolly, MD     Evaluation Performed:  Follow-Up Visit  Chief Complaint:  Follow up visit  History of Present Illness:    Karina Green is a 66 y.o. female seen today for follow up of the following medical problems.   1. CAD - hx of CABG Nov 2015 (LIMA-LAD,SVG-OM) - 10/2014 echo LVEF 60-65%, abnormal diastolic function indeterminant grade,       09/2017 echo LVEF 60-65%, no WMAs, grade I diastolic dysfunction, mild AS 10/2017 nuclear stress: low risk, gut artifact vs small basal inferolateral defect with mild reversibility       09/2021 nuclear stress; no clear ischemia, likely variable soft tissue attenuation anterolateral wall.  - occasinal chest pains since last visit, not specifically exertional.      2. HTN -compliant with meds     3. Hyperlipidemia - compliant with statin, labs followed by pcp -  not able to afford zetia.    4. Aortic stenosis  - 09/2017 echo mean grad 16, AVA VTI 1.66 consistent with mild AS   03/2020 echo: LVEF 60-65%, moderate AS mean grad 21, AVA VTI 1.10   At last visit she reported progressing DOE. We repeated her echo.  11/2021 echo: LVEF 55-60%, grade II dd, mod to severe AS mean gradient 28, AVA VTI 0.71, DI 0.25 SVI 33 which is mildly decreased and may explain to some degree lower than expected gradient. - I reviewed the study. Morphologically the valve appears severe. The continous wave Doppler gradients in the apical 5 chamber views appear off axis and likely are underestimated. The CW Doppler just distal to the aortic valve in the proximal aorta in the suprasternal view shows a mean gradient of 42 mmHg       5. Palpitations - 06/2019 event monitor was benign - reported side effects on metoprolo,, stopped taking.  - taking metoprolol 25mg  just once a day.  - symptoms improved with weaning of caffeine    The patient does not have symptoms concerning for COVID-19 infection (fever, chills, cough, or new shortness of breath).    Past Medical History:  Diagnosis Date   Arthritis    Back pain    CAD (coronary artery disease), native coronary artery-70% LM stenosis 10/25/2014   CHF (congestive heart failure) (HCC)    Cough last 30 yrs    nonproductive, no fevers   DM type 2 (diabetes mellitus, type 2) ([place]) 10/25/2014   Gout  Hyperlipidemia    Hypertension    Past Surgical History:  Procedure Laterality Date   ABDOMINAL HYSTERECTOMY     complete   APPENDECTOMY     bladder tack     CARDIAC CATHETERIZATION  10/25/14   70% LM stensis, normal EF   CHOLECYSTECTOMY     CORONARY ARTERY BYPASS GRAFT N/A 10/29/2014   Procedure: CORONARY ARTERY BYPASS GRAFTING (CABG) TIMES TWO USING LEFT INTERNAL MAMMARY ARTERY AND RIGHT LEG GREATER SAPHENOUS VEIN HARVESTED ENDOSCOPICALLY.;  Surgeon: Ivin Poot, MD;  Location: [place];  Service: Open Heart  Surgery;  Laterality: N/A;   HERNIA REPAIR     INTRAOPERATIVE TRANSESOPHAGEAL ECHOCARDIOGRAM N/A 10/29/2014   Procedure: INTRAOPERATIVE TRANSESOPHAGEAL ECHOCARDIOGRAM;  Surgeon: Ivin Poot, MD;  Location: [place];  Service: Open Heart Surgery;  Laterality: N/A;   JOINT REPLACEMENT Right 2009   hip total   LEFT HEART CATHETERIZATION WITH CORONARY ANGIOGRAM N/A 10/26/2014   Procedure: LEFT HEART CATHETERIZATION WITH CORONARY ANGIOGRAM;  Surgeon: Troy Sine, MD;  Location: [facility] CATH LAB;  Service: Cardiovascular;  Laterality: N/A;   TONSILLECTOMY     TOTAL HIP ARTHROPLASTY     TOTAL HIP ARTHROPLASTY Left 05/07/2016   Procedure: TOTAL HIP ARTHROPLASTY ANTERIOR APPROACH;  Surgeon: Rod Can, MD;  Location: [facility];  Service: Orthopedics;  Laterality: Left;     Current Meds  Medication Sig   allopurinol (ZYLOPRIM) 100 MG tablet Take 1 tablet by mouth daily.   aspirin EC 81 MG tablet Take 81 mg by mouth 2 (two) times daily.   atorvastatin (LIPITOR) 80 MG tablet TAKE ONE TABLET BY MOUTH ONCE DAILY AT 6 P.M.   furosemide (LASIX) 20 MG tablet Take 1 tablet (20 mg total) by mouth daily.   isosorbide mononitrate (IMDUR) 30 MG 24 hr tablet Take 0.5 tablets (15 mg total) by mouth daily.   losartan (COZAAR) 25 MG tablet Take 0.5 tablets (12.5 mg total) daily by mouth.   metoprolol tartrate (LOPRESSOR) 25 MG tablet Take 0.5 tablets (12.5 mg total) by mouth 2 (two) times daily. (Patient taking differently: Take 25 mg by mouth 2 (two) times daily.)   Multiple Vitamin (MULTIVITAMIN WITH MINERALS) TABS tablet Take 1 tablet by mouth daily.   Omega-3 Fatty Acids (FISH OIL) 1000 MG CAPS Take 1 capsule by mouth daily.   potassium chloride SA (K-DUR,KLOR-CON) 20 MEQ tablet Take 1 tablet (20 mEq total) by mouth daily. NEED OV.     Allergies:   Patient has no known allergies.   Social History   Tobacco Use   Smoking status: Never   Smokeless tobacco: Never  Vaping Use   Vaping Use: Never used   Substance Use Topics   Alcohol use: No    Alcohol/week: 0.0 standard drinks   Drug use: No     Family Hx: The patient's family history includes CAD in her father; Coronary artery disease in her brother and mother; Hypertension in her father and mother.  ROS:   Please see the history of present illness.     All other systems reviewed and are negative.   Prior CV studies:   The following studies were reviewed today:  10/2014 Cath HEMODYNAMICS:    Central Aorta: 140/66   Left Ventricle: 140/18   ANGIOGRAPHY:    The left main coronary artery was a large-caliber vessel which bifurcated into the LAD and left circumflex coronary artery.  There was a 70% distal left main stenosis prior to its bifurcation with a suggestion of a  focal napkin ring like appearance. This did not improve following IC nitroglycerin administration.   The LAD was angiographically normal and gave rise to 2 major diagonal vessels and several septal perforating arteries. The vessel extended to the LV apex.    The left circumflex coronary artery was angiographically normal and gave rise to one major bifurcating obtuse marginal [place]aja Monts.    The RCA was angiographically normal it gave rise to a  PDA and PLA vessel.    Left ventriculography revealed normal global LV contractility without focal segmental wall motion abnormalities. There was no evidence for mitral regurgitation. Ejection fraction was 60%.     Total contrast used: 85 cc Omnipaque   IMPRESSION:   Significant coronary obstructive disease with 70% distal left main stenosis.   Normal LV function.     RECOMMENDATION:   Surgical consultation for consideration for CABG.   10/2014 Echo Study Conclusions  - Left ventricle: The cavity size was normal. Wall thickness was   increased in a pattern of mild LVH. Systolic function was normal.   The estimated ejection fraction was in the range of 60% to 65%.   Diastolic function is abnormal,  indeterminant grade. Wall motion   was normal; there were no regional wall motion abnormalities. - Aortic valve: Moderately calcified annulus. Trileaflet; mildly   thickened leaflets. There is aortic sclerosis without significant   stenosis. There was trivial regurgitation. Valve area (VTI): 1.79   cm^2. Valve area (Vmax): 1.82 cm^2. - Mitral valve: Mildly calcified annulus. Mildly thickened leaflets   . - Atrial septum: No defect or patent foramen ovale was identified. - Technically adequate study.  10/2014 Carotid US Summary:  - The vertebral arteries appear patent with antegrade flow. - Findings consistent with 1- 39 percent stenosis involving the   right internal carotid artery and the left internal carotid   artery. - ICA/CCA ratio. right = 0.81. left =1.36. - Palpable pedal pulses.   05/2015 echo Study Conclusions   - Left ventricle: The cavity size was normal. Wall thickness was   increased in a pattern of moderate LVH. Systolic function was   normal. The estimated ejection fraction was in the range of 60%   to 65%. Wall motion was normal; there were no regional wall   motion abnormalities. Doppler parameters are consistent with   abnormal left ventricular relaxation (grade 1 diastolic   dysfunction). - Aortic valve: Trileaflet; moderately calcified leaflets. Left   coronary cusp mobility was restricted. There was mild stenosis.   Mean gradient (S): 16 mm Hg. Peak gradient (S): 27 mm Hg. VTI   ratio of LVOT to aortic valve: 0.48. Valve area (VTI): 1.5 cm^2. - Mitral valve: Calcified annulus. There was trivial regurgitation. - Right atrium: Central venous pressure (est): 3 mm Hg. - Atrial septum: No defect or patent foramen ovale was identified. - Tricuspid valve: There was trivial regurgitation. - Pulmonary arteries: Systolic pressure could not be accurately   estimated. - Pericardium, extracardiac: There was no pericardial effusion.   Impressions:   - Moderate LVH  with LVEF 60-65% and grade 1 diastolic dysfunction.   Mild calcific aortic stenosis as outlined. Trivial tricuspid   regurgitation, unable to assess PASP.     03/2016 Carotid US Mild bilateral disease     10/2017 nuclear stress There was no ST segment deviation noted during stress. This is a low risk study. The left ventricular ejection fraction is normal (55-65%). Small basal inferolateral defect with mild reversibility. This may represent a small  prior infarct with mild ischemia, however there is significant adjacent radiotracer uptake which may affect the defect. Either finding would support low risk     09/2017 echo Study Conclusions   - Left ventricle: The cavity size was normal. Wall thickness was   increased in a pattern of mild LVH. Systolic function was normal.   The estimated ejection fraction was in the range of 60% to 65%.   Wall motion was normal; there were no regional wall motion   abnormalities. Doppler parameters are consistent with abnormal   left ventricular relaxation (grade 1 diastolic dysfunction). - Aortic valve: There was mild stenosis. Mean gradient (S): 16 mm   Hg. Mean gradient of 19 by pedhoff probe. Valve area (VTI): 1.8   cm^2. Valve area (Vmax): 1.81 cm^2. Valve area (Vmean): 1.66   cm^2. - Mitral valve: Mildly calcified annulus. Normal thickness leaflets   . - Technically adequate study.     06/2019 event monitor   7 day event monitor Min HR 49, Max HR 120, average HR 73. Min HR occurred in early AM hours presumably while sleeping No symptoms reported Telemetry tracings show normal sinus rhythm  09/2021 nuclear stress   Findings are consistent with no ischemia. The study is low risk.   No ST deviation was noted. The ECG was negative for ischemia.   LV perfusion is equivocal.  Moderate sized, mild intensity, apical to basal anterolateral defect that is partially reversible at the base and most consistent with variable soft tissue attenuation  rather than ischemia.  Wall motion is normal.   Left ventricular function is normal. Nuclear stress EF: 58 %.   Low risk study with evidence of variable breast attenuation, no definite ischemia.  LVEF 58%.  Labs/Other Tests and Data Reviewed:    EKG:  No ECG reviewed.  Recent Labs: No results found for requested labs within last 8760 hours.   Recent Lipid Panel Lab Results  Component Value Date/Time   CHOL 177 10/25/2014 04:07 AM   TRIG 235 (H) 10/25/2014 04:07 AM   HDL 30 (L) 10/25/2014 04:07 AM   CHOLHDL 5.9 10/25/2014 04:07 AM   LDLCALC 100 (H) 10/25/2014 04:07 AM    Wt Readings from Last 3 Encounters:  10/03/21 233 lb 9.6 oz (106 kg)  05/30/20 232 lb 9.6 oz (105.5 kg)  03/21/20 231 lb 12.8 oz (105.1 kg)     Risk Assessment/Calculations:          Objective:    Vital Signs:  BP 137/64    Pulse 61    Ht 5\' 3"  (1.6 m)    BMI 41.38 kg/m    Normal affect. Normal speech pattern and tone. Comfortable, no apparent distress. No audible signs of sob or wheezing.   ASSESSMENT & PLAN:    1.Aortic stenosis - from most recent echo appears to have progressed to severe. I believe the gradients are underestimated based on angle of acquisition, there is a single Doppler gradient measured in the suprasternal view with a mean gradient of 42 mmHg. AVA VTI and DI support severe AS - progressive dyspnea over the last several months - I think she is at the point to consider valve replacement - will refer to [facility] with Dr Burt Knack or Angelena Form and also have her establish with valve clinic    2. CAD - - hx of CABG Nov 2015 (LIMA-LAD,SVG-OM) -recent chest pain, DOE - recent lexiscan showed probable artifact as opposed to ischemia - given consideration for valve interevention will  need cath to verify her coronary status.        Shared Decision Making/Informed Consent The risks [stroke (1 in 1000), death (1 in 1000), kidney failure [usually temporary] (1 in 500), bleeding (1 in 200),  allergic reaction [possibly serious] (1 in 200)], benefits (diagnostic support and management of coronary artery disease) and alternatives of a cardiac catheterization were discussed in detail with Karina Green and she is willing to proceed.       COVID-19 Education: The signs and symptoms of COVID-19 were discussed with the patient and how to seek care for testing (follow up with PCP or arrange E-visit).  The importance of social distancing was discussed today.  Time:   Today, I have spent 19 minutes with the patient with telehealth technology discussing the above problems.     Medication Adjustments/Labs and Tests Ordered: Current medicines are reviewed at length with the patient today.  Concerns regarding medicines are outlined above.   Tests Ordered: No orders of the defined types were placed in this encounter.   Medication Changes: No orders of the defined types were placed in this encounter.   Follow Up:  In Person 2 months  Signed, Carlyle Dolly, MD  12/24/2021 1:23 PM    [facility]

## 2021-12-25 ENCOUNTER — Telehealth: Payer: Self-pay | Admitting: *Deleted

## 2021-12-25 ENCOUNTER — Other Ambulatory Visit (HOSPITAL_COMMUNITY)
Admission: RE | Admit: 2021-12-25 | Discharge: 2021-12-25 | Disposition: A | Discharge status: Home / Self Care | Payer: Medicare HMO | Source: Ambulatory Visit | Attending: Cardiology | Admitting: Cardiology

## 2021-12-25 DIAGNOSIS — I350 Nonrheumatic aortic (valve) stenosis: Secondary | ICD-10-CM | POA: Diagnosis present

## 2021-12-25 DIAGNOSIS — I2510 Atherosclerotic heart disease of native coronary artery without angina pectoris: Secondary | ICD-10-CM | POA: Diagnosis present

## 2021-12-25 DIAGNOSIS — Z01818 Encounter for other preprocedural examination: Secondary | ICD-10-CM | POA: Insufficient documentation

## 2021-12-25 LAB — BASIC METABOLIC PANEL
Anion gap: 7 (ref 5–15)
BUN: 19 mg/dL (ref 8–23)
CO2: 25 mmol/L (ref 22–32)
Calcium: 8.9 mg/dL (ref 8.9–10.3)
Chloride: 107 mmol/L (ref 98–111)
Creatinine, Ser: 0.94 mg/dL (ref 0.44–1.00)
GFR, Estimated: >60 mL/min (ref >60)
Glucose, Bld: 138 mg/dL — ABNORMAL HIGH (ref 70–99)
Potassium: 4.4 mmol/L (ref 3.5–5.1)
Sodium: 139 mmol/L (ref 135–145)

## 2021-12-25 LAB — CBC
HCT: 43 % (ref 36.0–46.0)
Hemoglobin: 13.9 g/dL (ref 12.0–15.0)
MCH: 28.8 pg (ref 26.0–34.0)
MCHC: 32.3 g/dL (ref 30.0–36.0)
MCV: 89 fL (ref 80.0–100.0)
Platelets: 233 10*3/uL (ref 150–400)
RBC: 4.83 MIL/uL (ref 3.87–5.11)
RDW: 15.6 % — ABNORMAL HIGH (ref 11.5–15.5)
WBC: 10.5 10*3/uL (ref 4.0–10.5)
nRBC: 0 % (ref 0.0–0.2)

## 2021-12-25 NOTE — Telephone Encounter (Signed)
Cardiac catheterization scheduled at [facility] for: Monday December 29, 2021 7:30 [facility] Main Entrance [facility]) at: 5:30 AM   Diet-no solid food after midnight prior to cath, clear liquids until 5 AM day of procedure.  Medication instructions for procedure: -Hold:  Lasix/KCl-AM of procedure -Except hold medications usual morning medications can be taken pre-cath with sips of water including aspirin 81 mg.    Confirmed patient has responsible adult to drive home post procedure and be with patient first 24 hours after arriving home.  [facility] does allow one visitor to accompany you and wait in the hospital waiting room while you are there for your procedure. You and your visitor will be asked to wear a mask once you enter the hospital.   Patient reports does not currently have any new symptoms concerning for COVID-19 and no household members with COVID-19 like illness.     Reviewed procedure/mask/visitor instructions with patient.

## 2021-12-29 ENCOUNTER — Ambulatory Visit (HOSPITAL_COMMUNITY)
Admission: RE | Admit: 2021-12-29 | Discharge: 2021-12-29 | Disposition: A | Discharge status: Home / Self Care | Payer: Medicare HMO | Source: Ambulatory Visit | Attending: Cardiovascular Disease | Admitting: Cardiovascular Disease

## 2021-12-29 ENCOUNTER — Other Ambulatory Visit: Payer: Self-pay

## 2021-12-29 ENCOUNTER — Encounter (HOSPITAL_COMMUNITY): Payer: Self-pay | Admitting: Cardiovascular Disease

## 2021-12-29 ENCOUNTER — Ambulatory Visit (HOSPITAL_COMMUNITY)
Admission: RE | Disposition: A | Discharge status: Home / Self Care | Payer: Medicare HMO | Source: Ambulatory Visit | Attending: Cardiovascular Disease

## 2021-12-29 DIAGNOSIS — E785 Hyperlipidemia, unspecified: Secondary | ICD-10-CM | POA: Diagnosis not present

## 2021-12-29 DIAGNOSIS — I509 Heart failure, unspecified: Secondary | ICD-10-CM | POA: Diagnosis not present

## 2021-12-29 DIAGNOSIS — I350 Nonrheumatic aortic (valve) stenosis: Secondary | ICD-10-CM | POA: Diagnosis not present

## 2021-12-29 DIAGNOSIS — I110 Hypertensive heart disease with heart failure: Secondary | ICD-10-CM | POA: Diagnosis not present

## 2021-12-29 DIAGNOSIS — R002 Palpitations: Secondary | ICD-10-CM | POA: Diagnosis not present

## 2021-12-29 DIAGNOSIS — R0609 Other forms of dyspnea: Secondary | ICD-10-CM | POA: Insufficient documentation

## 2021-12-29 DIAGNOSIS — I2510 Atherosclerotic heart disease of native coronary artery without angina pectoris: Secondary | ICD-10-CM

## 2021-12-29 DIAGNOSIS — I25810 Atherosclerosis of coronary artery bypass graft(s) without angina pectoris: Secondary | ICD-10-CM | POA: Diagnosis not present

## 2021-12-29 DIAGNOSIS — Z79899 Other long term (current) drug therapy: Secondary | ICD-10-CM | POA: Insufficient documentation

## 2021-12-29 HISTORY — PX: RIGHT/LEFT HEART CATH AND CORONARY/GRAFT ANGIOGRAPHY: CATH118267

## 2021-12-29 LAB — GLUCOSE, CAPILLARY: Glucose-Capillary: 128 mg/dL — ABNORMAL HIGH (ref 70–99)

## 2021-12-29 LAB — POCT I-STAT EG7
Acid-base deficit: 2 mmol/L (ref 0.0–2.0)
Bicarbonate: 23.8 mmol/L (ref 20.0–28.0)
Calcium, Ion: 1.24 mmol/L (ref 1.15–1.40)
HCT: 35 % — ABNORMAL LOW (ref 36.0–46.0)
Hemoglobin: 11.9 g/dL — ABNORMAL LOW (ref 12.0–15.0)
O2 Saturation: 74 %
Potassium: 3.5 mmol/L (ref 3.5–5.1)
Sodium: 145 mmol/L (ref 135–145)
TCO2: 25 mmol/L (ref 22–32)
pCO2, Ven: 44.6 mmHg (ref 44.0–60.0)
pH, Ven: 7.336 (ref 7.250–7.430)
pO2, Ven: 42 mmHg (ref 32.0–45.0)

## 2021-12-29 LAB — POCT I-STAT 7, (LYTES, BLD GAS, ICA,H+H)
Acid-base deficit: 3 mmol/L — ABNORMAL HIGH (ref 0.0–2.0)
Bicarbonate: 22.2 mmol/L (ref 20.0–28.0)
Calcium, Ion: 1.23 mmol/L (ref 1.15–1.40)
HCT: 35 % — ABNORMAL LOW (ref 36.0–46.0)
Hemoglobin: 11.9 g/dL — ABNORMAL LOW (ref 12.0–15.0)
O2 Saturation: 97 %
Potassium: 3.5 mmol/L (ref 3.5–5.1)
Sodium: 144 mmol/L (ref 135–145)
TCO2: 23 mmol/L (ref 22–32)
pCO2 arterial: 39.3 mmHg (ref 32.0–48.0)
pH, Arterial: 7.36 (ref 7.350–7.450)
pO2, Arterial: 96 mmHg (ref 83.0–108.0)

## 2021-12-29 SURGERY — RIGHT/LEFT HEART CATH AND CORONARY/GRAFT ANGIOGRAPHY
Anesthesia: LOCAL

## 2021-12-29 MED ORDER — SODIUM CHLORIDE 0.9% FLUSH: 3.0000 mL | INTRAVENOUS | Status: DC | PRN

## 2021-12-29 MED ORDER — SODIUM CHLORIDE 0.9 % IV SOLN: 250.0000 mL | INTRAVENOUS | Status: DC | PRN

## 2021-12-29 MED ORDER — SODIUM CHLORIDE 0.9 % WEIGHT BASED INFUSION: 1.0000 mL/kg/h | INTRAVENOUS | Status: DC

## 2021-12-29 MED ORDER — LIDOCAINE HCL (PF) 1 % IJ SOLN
INTRAMUSCULAR | Status: DC | PRN
  Administered 2021-12-29 (×2): 2 mL via INTRADERMAL
  Administered 2021-12-29: 10 mL via INTRADERMAL

## 2021-12-29 MED ORDER — HEPARIN SODIUM (PORCINE) 1000 UNIT/ML IJ SOLN
INTRAMUSCULAR | Status: AC
  Filled 2021-12-29: qty 10

## 2021-12-29 MED ORDER — MIDAZOLAM HCL 2 MG/2ML IJ SOLN
INTRAMUSCULAR | Status: AC
  Filled 2021-12-29: qty 2

## 2021-12-29 MED ORDER — HEPARIN (PORCINE) IN NACL 1000-0.9 UT/500ML-% IV SOLN
INTRAVENOUS | Status: AC
  Filled 2021-12-29: qty 1000

## 2021-12-29 MED ORDER — ASPIRIN 81 MG PO CHEW: 81.0000 mg | CHEWABLE_TABLET | ORAL | Status: DC

## 2021-12-29 MED ORDER — VERAPAMIL HCL 2.5 MG/ML IV SOLN
INTRAVENOUS | Status: DC | PRN
  Administered 2021-12-29: 10 mL via INTRA_ARTERIAL

## 2021-12-29 MED ORDER — SODIUM CHLORIDE 0.9 % WEIGHT BASED INFUSION
3.0000 mL/kg/h | INTRAVENOUS | Status: AC
  Administered 2021-12-29: 3 mL/kg/h via INTRAVENOUS

## 2021-12-29 MED ORDER — ACETAMINOPHEN 325 MG PO TABS: 650.0000 mg | ORAL_TABLET | ORAL | Status: DC | PRN

## 2021-12-29 MED ORDER — SODIUM CHLORIDE 0.9% FLUSH: 3.0000 mL | Freq: Two times a day (BID) | INTRAVENOUS | Status: DC

## 2021-12-29 MED ORDER — LABETALOL HCL 5 MG/ML IV SOLN: 10.0000 mg | INTRAVENOUS | Status: DC | PRN

## 2021-12-29 MED ORDER — HEPARIN (PORCINE) IN NACL 1000-0.9 UT/500ML-% IV SOLN
INTRAVENOUS | Status: DC | PRN
  Administered 2021-12-29 (×2): 500 mL

## 2021-12-29 MED ORDER — HYDRALAZINE HCL 20 MG/ML IJ SOLN: 10.0000 mg | INTRAMUSCULAR | Status: DC | PRN

## 2021-12-29 MED ORDER — LIDOCAINE HCL (PF) 1 % IJ SOLN
INTRAMUSCULAR | Status: AC
  Filled 2021-12-29: qty 30

## 2021-12-29 MED ORDER — FENTANYL CITRATE (PF) 100 MCG/2ML IJ SOLN
INTRAMUSCULAR | Status: DC | PRN
  Administered 2021-12-29 (×2): 25 ug via INTRAVENOUS

## 2021-12-29 MED ORDER — VERAPAMIL HCL 2.5 MG/ML IV SOLN
INTRAVENOUS | Status: AC
  Filled 2021-12-29: qty 2

## 2021-12-29 MED ORDER — MIDAZOLAM HCL 2 MG/2ML IJ SOLN
INTRAMUSCULAR | Status: DC | PRN
  Administered 2021-12-29: 1 mg via INTRAVENOUS
  Administered 2021-12-29: 2 mg via INTRAVENOUS

## 2021-12-29 MED ORDER — FENTANYL CITRATE (PF) 100 MCG/2ML IJ SOLN
INTRAMUSCULAR | Status: AC
  Filled 2021-12-29: qty 2

## 2021-12-29 MED ORDER — ONDANSETRON HCL 4 MG/2ML IJ SOLN: 4.0000 mg | Freq: Four times a day (QID) | INTRAMUSCULAR | Status: DC | PRN

## 2021-12-29 MED ORDER — IOHEXOL 350 MG/ML SOLN
INTRAVENOUS | Status: DC | PRN
  Administered 2021-12-29: 75 mL

## 2021-12-29 SURGICAL SUPPLY — 17 items
CATH BALLN WEDGE 5F 110CM (CATHETERS) ×1 IMPLANT
CATH INFINITI 5FR AL1 (CATHETERS) ×1 IMPLANT
CATH INFINITI 5FR MULTPACK ANG (CATHETERS) ×1 IMPLANT
CLOSURE MYNX CONTROL 5F (Vascular Products) ×2 IMPLANT
DEVICE RAD COMP TR BAND LRG (VASCULAR PRODUCTS) ×1 IMPLANT
GLIDESHEATH SLEND SS 6F .021 (SHEATH) ×1 IMPLANT
GUIDEWIRE INQWIRE 1.5J.035X260 (WIRE) IMPLANT
INQWIRE 1.5J .035X260CM (WIRE) ×2
KIT HEART LEFT (KITS) ×2 IMPLANT
PACK CARDIAC CATHETERIZATION (CUSTOM PROCEDURE TRAY) ×2 IMPLANT
SHEATH GLIDE SLENDER 4/5FR (SHEATH) ×1 IMPLANT
SHEATH PINNACLE 5F 10CM (SHEATH) ×2 IMPLANT
TRANSDUCER W/STOPCOCK (MISCELLANEOUS) ×2 IMPLANT
TUBING CIL FLEX 10 FLL-RA (TUBING) ×2 IMPLANT
WIRE EMERALD 3MM-J .035X150CM (WIRE) ×1 IMPLANT
WIRE EMERALD ST .035X260CM (WIRE) ×1 IMPLANT
WIRE MICRO SET SILHO 5FR 7 (SHEATH) ×1 IMPLANT

## 2021-12-29 NOTE — Interval H&P Note (Signed)
Cath Lab Visit (complete for each Cath Lab visit)  Clinical Evaluation Leading to the Procedure:   ACS: No.  Non-ACS:    Anginal Classification: CCS II  Anti-ischemic medical therapy: Minimal Therapy (1 class of medications)  Non-Invasive Test Results: No non-invasive testing performed  Prior CABG: Previous CABG      History and Physical Interval Note:  12/29/2021 7:43 AM  Karina Green  has presented today for surgery, with the diagnosis of aortic stenosis.  The various methods of treatment have been discussed with the patient and family. After consideration of risks, benefits and other options for treatment, the patient has consented to  Procedure(s): RIGHT/LEFT HEART CATH AND CORONARY/GRAFT ANGIOGRAPHY (N/A) as a surgical intervention.  The patient's history has been reviewed, patient examined, no change in status, stable for surgery.  I have reviewed the patient's chart and labs.  Questions were answered to the patient's satisfaction.     Sherren Mocha

## 2021-12-30 ENCOUNTER — Telehealth: Payer: Self-pay | Admitting: Cardiology

## 2021-12-30 DIAGNOSIS — Z0279 Encounter for issue of other medical certificate: Secondary | ICD-10-CM

## 2021-12-30 MED FILL — Heparin Sodium (Porcine) Inj 1000 Unit/ML: INTRAMUSCULAR | Qty: 10 | Status: AC

## 2021-12-30 NOTE — Telephone Encounter (Signed)
[employer] FMLA paper work dropped off to be completed for pt's daughter in law Margurete Guaman to help with pt getting to and from apts and to assist with care at home.  $29 cash payment received and put in envelope in safe till forms are completed and charge is posted.    Forms have been scanned and given to clinical staff for completion

## 2021-12-31 ENCOUNTER — Telehealth: Payer: Self-pay | Admitting: Cardiology

## 2021-12-31 ENCOUNTER — Encounter: Payer: Self-pay | Admitting: Physician Assistant

## 2021-12-31 ENCOUNTER — Telehealth: Payer: Self-pay | Admitting: Physician Assistant

## 2021-12-31 ENCOUNTER — Other Ambulatory Visit: Payer: Self-pay | Admitting: Physician Assistant

## 2021-12-31 DIAGNOSIS — I350 Nonrheumatic aortic (valve) stenosis: Secondary | ICD-10-CM

## 2021-12-31 MED ORDER — PREDNISONE 10 MG (21) PO TBPK: ORAL_TABLET | ORAL | 0 refills | Status: DC

## 2021-12-31 NOTE — Telephone Encounter (Signed)
Pt having a reaction to something she received during her procedure.. pt is complaining of itchy hives.

## 2021-12-31 NOTE — Telephone Encounter (Signed)
Already addressed -   Pt had a cath on 1/9 and the following morning woke up with a large red pruritic rash. Benadryl and topical steroids have not helped. No breathing issues. Will call in a steroid taper and pre medicate her for her upcoming CT scans on 1/24. A contrast allergy as been added to her allergy list.    Angelena Form PA-C  [facility]

## 2021-12-31 NOTE — Telephone Encounter (Addendum)
°  [facility]   MULTIDISCIPLINARY HEART VALVE TEAM  Pt had a cath on 1/9 and the following morning woke up with a large red pruritic rash. Benadryl and topical steroids have not helped. No breathing issues. Will call in a steroid taper and pre medicate her for her upcoming CT scans on 1/24. A contrast allergy as been added to her allergy list.   Angelena Form PA-C  [facility]

## 2022-01-06 ENCOUNTER — Other Ambulatory Visit: Payer: Self-pay | Admitting: Physician Assistant

## 2022-01-06 MED ORDER — PREDNISONE 50 MG PO TABS: ORAL_TABLET | ORAL | 0 refills | Status: DC

## 2022-01-06 NOTE — Telephone Encounter (Signed)
Completed forms received from Dr. Harl Bowie on 01/05/2022  Jeannine Boga to inform her that they have been completed  Will fax completed forms to [facility] for charge posting

## 2022-01-13 ENCOUNTER — Other Ambulatory Visit: Payer: Self-pay

## 2022-01-13 ENCOUNTER — Ambulatory Visit (HOSPITAL_COMMUNITY)
Admission: RE | Admit: 2022-01-13 | Discharge: 2022-01-13 | Disposition: A | Discharge status: Home / Self Care | Payer: Medicare HMO | Source: Ambulatory Visit | Attending: Physician Assistant | Admitting: Physician Assistant

## 2022-01-13 DIAGNOSIS — I350 Nonrheumatic aortic (valve) stenosis: Secondary | ICD-10-CM | POA: Insufficient documentation

## 2022-01-13 DIAGNOSIS — R911 Solitary pulmonary nodule: Secondary | ICD-10-CM | POA: Diagnosis not present

## 2022-01-13 DIAGNOSIS — K760 Fatty (change of) liver, not elsewhere classified: Secondary | ICD-10-CM | POA: Diagnosis not present

## 2022-01-13 MED ORDER — IOHEXOL 350 MG/ML SOLN
100.0000 mL | Freq: Once | INTRAVENOUS | Status: AC | PRN
  Administered 2022-01-13: 11:00:00 100 mL via INTRAVENOUS

## 2022-01-14 NOTE — Progress Notes (Signed)
Favor a 29 mm Medtronic Evolut valve with this patient's body size she would have a better hemodynamic result.

## 2022-02-03 ENCOUNTER — Encounter: Payer: Self-pay | Admitting: Physician Assistant

## 2022-02-03 ENCOUNTER — Other Ambulatory Visit: Payer: Self-pay | Admitting: Physician Assistant

## 2022-02-03 DIAGNOSIS — I350 Nonrheumatic aortic (valve) stenosis: Secondary | ICD-10-CM

## 2022-02-03 MED ORDER — PREDNISONE 50 MG PO TABS: ORAL_TABLET | ORAL | 0 refills | Status: DC

## 2022-02-03 NOTE — Addendum Note (Signed)
Addended by: Eileen Stanford on: 02/03/2022 10:35 AM   Modules accepted: Orders

## 2022-02-04 ENCOUNTER — Encounter: Payer: Self-pay | Admitting: Surgery

## 2022-02-04 ENCOUNTER — Institutional Professional Consult (permissible substitution): Payer: Medicare HMO | Admitting: Surgery

## 2022-02-04 ENCOUNTER — Other Ambulatory Visit: Payer: Self-pay

## 2022-02-04 VITALS — BP 156/78 | HR 69 | Resp 20 | Ht 63.0 in | Wt 230.0 lb

## 2022-02-04 DIAGNOSIS — I350 Nonrheumatic aortic (valve) stenosis: Secondary | ICD-10-CM | POA: Diagnosis not present

## 2022-02-04 NOTE — Progress Notes (Signed)
Pre Surgical Assessment: 5 M Walk Test  63M=16.79ft  5 Meter Walk Test- trial 1: 6.48 seconds 5 Meter Walk Test- trial 2: 9.32 seconds 5 Meter Walk Test- trial 3: 7.90 seconds 5 Meter Walk Test Average: 7.9 seconds

## 2022-02-04 NOTE — Progress Notes (Addendum)
Patient ID: Karina Green, female   DOB: [birth year], 66 y.o.   MRN: [id number]  [facility]ID[facility]IPLIN[facility] VALVE CLINIC         [address]       [place] [address]             [phone]          CARDIOTHORACIC SURGERY CONSULTATION REPORT  PCP is [facility] Referring Provider is Sherren Mocha, MD Primary Cardiologist is Carlyle Dolly, MD  Reason for consultation:  Severe aortic stenosis  HPI:  The patient is a 66 year old woman with a history of hypertension, hyperlipidemia, type 2 diabetes, gout, coronary disease status post coronary bypass graft surgery x2 in 2015 by Dr. Prescott Gum, and aortic stenosis that has been followed by Dr. Harl Bowie.  Her most recent echo on 12/08/2021 showed a bicuspid aortic valve with moderate calcification and thickening.  The mean gradient was 28 mmHg with a peak gradient of 43 mmHg.  Valve area by VTI was 0.71 cm.  There is mild aortic insufficiency.  Left ventricular ejection fraction was 55 to 60% with grade 2 diastolic dysfunction.  Previous echocardiogram in April 2021 showed a mean gradient of 21 mmHg.  The patient reports that over the past year she has had progressive exertional shortness of breath and fatigue and general tiredness.  She says that she can sit down and fall asleep anytime.  She denies any dizziness or syncope.  She has had occasional episodes of chest pressure.  She denies orthopnea and peripheral edema.  She is divorced and here today with a friend.  Past Medical History:  Diagnosis Date   Arthritis    Back pain    CAD (coronary artery disease), native coronary artery-70% LM stenosis 10/25/2014   Cough last 30 yrs    nonproductive, no fevers   DM type 2 (diabetes mellitus, type 2) ([place]) 10/25/2014   Gout    Hyperlipidemia    Hypertension     Past Surgical History:  Procedure Laterality Date   ABDOMINAL HYSTERECTOMY     complete   APPENDECTOMY      bladder tack     CARDIAC CATHETERIZATION  10/25/14   70% LM stensis, normal EF   CHOLECYSTECTOMY     CORONARY ARTERY BYPASS GRAFT N/A 10/29/2014   Procedure: CORONARY ARTERY BYPASS GRAFTING (CABG) TIMES TWO USING LEFT INTERNAL MAMMARY ARTERY AND RIGHT LEG GREATER SAPHENOUS VEIN HARVESTED ENDOSCOPICALLY.;  Surgeon: Ivin Poot, MD;  Location: [place];  Service: Open Heart Surgery;  Laterality: N/A;   HERNIA REPAIR     INTRAOPERATIVE TRANSESOPHAGEAL ECHOCARDIOGRAM N/A 10/29/2014   Procedure: INTRAOPERATIVE TRANSESOPHAGEAL ECHOCARDIOGRAM;  Surgeon: Ivin Poot, MD;  Location: [facility];  Service: Open Heart Surgery;  Laterality: N/A;   JOINT REPLACEMENT Right 2009   hip total   LEFT HEART CATHETERIZATION WITH CORONARY ANGIOGRAM N/A 10/26/2014   Procedure: LEFT HEART CATHETERIZATION WITH CORONARY ANGIOGRAM;  Surgeon: Troy Sine, MD;  Location: [facility] CATH LAB;  Service: Cardiovascular;  Laterality: N/A;   RIGHT/LEFT HEART CATH AND CORONARY/GRAFT ANGIOGRAPHY N/A 12/29/2021   Procedure: RIGHT/LEFT HEART CATH AND CORONARY/GRAFT ANGIOGRAPHY;  Surgeon: Sherren Mocha, MD;  Location: [facility] CV LAB;  Service: Cardiovascular;  Laterality: N/A;   TONSILLECTOMY     TOTAL HIP ARTHROPLASTY     TOTAL HIP ARTHROPLASTY Left 05/07/2016   Procedure: TOTAL HIP ARTHROPLASTY ANTERIOR APPROACH;  Surgeon: Rod Can, MD;  Location: [facility];  Service: Orthopedics;  Laterality: Left;    Family History  Problem Relation Age of Onset   Hypertension Mother    Coronary artery disease Mother        CABG   Hypertension Father    CAD Father        CABG   Coronary artery disease Brother        Stent    Social History   Socioeconomic History   Marital status: Divorced    Spouse name: Not on file   Number of children: Not on file   Years of education: Not on file   Highest education level: Not on file  Occupational History   Not on file  Tobacco Use   Smoking status: Never   Smokeless tobacco: Never   Vaping Use   Vaping Use: Never used  Substance and Sexual Activity   Alcohol use: No    Alcohol/week: 0.0 standard drinks   Drug use: No   Sexual activity: Not on file  Other Topics Concern   Not on file  Social History Narrative   Not on file   Social Determinants of Health   Financial Resource Strain: Not on file  Food Insecurity: Not on file  Transportation Needs: Not on file  Physical Activity: Not on file  Stress: Not on file  Social Connections: Not on file  Intimate Partner Violence: Not on file    Prior to Admission medications   Medication Sig Start Date End Date Taking? Authorizing Provider  allopurinol (ZYLOPRIM) 100 MG tablet Take 100 mg by mouth daily. 09/16/17  Yes [provider]  aspirin EC 81 MG tablet Take 81 mg by mouth 2 (two) times daily.   Yes [provider]  atorvastatin (LIPITOR) 80 MG tablet TAKE ONE TABLET BY MOUTH ONCE DAILY AT 6 P.M. 03/27/16  Yes Branch, Alphonse Guild, MD  furosemide (LASIX) 20 MG tablet Take 1 tablet (20 mg total) by mouth daily. 03/27/16  Yes BranchAlphonse Guild, MD  isosorbide mononitrate (IMDUR) 30 MG 24 hr tablet Take 0.5 tablets (15 mg total) by mouth daily. 12/15/17  Yes Arnoldo Lenis, MD  losartan (COZAAR) 25 MG tablet Take 0.5 tablets (12.5 mg total) daily by mouth. 11/05/17  Yes Lendon Colonel, NP  Multiple Vitamin (MULTIVITAMIN WITH MINERALS) TABS tablet Take 1 tablet by mouth daily.   Yes [provider]  Omega-3 Fatty Acids (FISH OIL) 1000 MG CAPS Take 1,000 mg by mouth daily.   Yes [provider]  potassium chloride SA (K-DUR,KLOR-CON) 20 MEQ tablet Take 1 tablet (20 mEq total) by mouth daily. NEED OV. 10/17/18  Yes Lendon Colonel, NP  predniSONE (DELTASONE) 50 MG tablet Take as directed 13, 7 and 1 hour before TAVR for contrast dye allergy 02/03/22  Yes Eileen Stanford, PA-C  metoprolol tartrate (LOPRESSOR) 25 MG tablet Take 0.5 tablets (12.5 mg total) by mouth 2 (two)  times daily. Patient taking differently: Take 25 mg by mouth 2 (two) times daily. 10/03/21 01/01/22  Arnoldo Lenis, MD    Current Outpatient Medications  Medication Sig Dispense Refill   allopurinol (ZYLOPRIM) 100 MG tablet Take 100 mg by mouth daily.     aspirin EC 81 MG tablet Take 81 mg by mouth 2 (two) times daily.     atorvastatin (LIPITOR) 80 MG tablet TAKE ONE TABLET BY MOUTH ONCE DAILY AT 6 P.M. 90 tablet 01   furosemide (LASIX) 20 MG tablet Take 1 tablet (20 mg total) by  mouth daily. 90 tablet 1   isosorbide mononitrate (IMDUR) 30 MG 24 hr tablet Take 0.5 tablets (15 mg total) by mouth daily. 45 tablet 3   losartan (COZAAR) 25 MG tablet Take 0.5 tablets (12.5 mg total) daily by mouth. 45 tablet 3   Multiple Vitamin (MULTIVITAMIN WITH MINERALS) TABS tablet Take 1 tablet by mouth daily.     Omega-3 Fatty Acids (FISH OIL) 1000 MG CAPS Take 1,000 mg by mouth daily.     potassium chloride SA (K-DUR,KLOR-CON) 20 MEQ tablet Take 1 tablet (20 mEq total) by mouth daily. NEED OV. 90 tablet 0   predniSONE (DELTASONE) 50 MG tablet Take as directed 13, 7 and 1 hour before TAVR for contrast dye allergy 3 tablet 0   metoprolol tartrate (LOPRESSOR) 25 MG tablet Take 0.5 tablets (12.5 mg total) by mouth 2 (two) times daily. (Patient taking differently: Take 25 mg by mouth 2 (two) times daily.) 90 tablet 3   Current Facility-Administered Medications  Medication Dose Route Frequency Provider Last Rate Last Admin   sodium chloride flush (NS) 0.9 % injection 3 mL  3 mL Intravenous Q12H Branch, Alphonse Guild, MD        Allergies  Allergen Reactions   Iodinated Contrast Media Rash    Very large itchy rash within 24 hours of heart cath       Review of Systems:   General:  normal appetite, + decreased energy, no weight gain, no weight loss, no fever  Cardiac:  + chest pain with exertion, no chest pain at rest, + SOB with moderate exertion, no resting SOB, no PND, no orthopnea, no palpitations, no  arrhythmia, no atrial fibrillation, no LE edema, no dizzy spells, no syncope  Respiratory:  + exertional shortness of breath, no home oxygen, no productive cough, + dry cough, no bronchitis, no wheezing, no hemoptysis, no asthma, no pain with inspiration or cough, no sleep apnea, no CPAP at night  GI:   no difficulty swallowing, no reflux, no frequent heartburn, no hiatal hernia, no abdominal pain, no constipation, no diarrhea, no hematochezia, no hematemesis, no melena  GU:   no dysuria,  no frequency, no urinary tract infection, no hematuria, no kidney stones, no kidney disease  Vascular:  no pain suggestive of claudication, no pain in feet, + leg cramps, no varicose veins, no DVT, no non-healing foot ulcer  Neuro:   no stroke, no TIA's, no seizures, no headaches, no temporary blindness one eye,  no slurred speech, no peripheral neuropathy, no chronic pain, no instability of gait, no memory/cognitive dysfunction  Musculoskeletal: + arthritis, no joint swelling, no myalgias, no difficulty walking, normal mobility   Skin:   no rash, no itching, no skin infections, no pressure sores or ulcerations  Psych:   no anxiety, no depression, no nervousness, no unusual recent stress  Eyes:   no blurry vision, + floaters, no recent vision changes, no glasses or contacts  ENT:   no hearing loss, no loose or painful teeth, no dentures, last saw dentist last year  Hematologic:  no easy bruising, no abnormal bleeding, no clotting disorder, no frequent epistaxis  Endocrine:  + diabetes, does not check CBG's at home very often.     Physical Exam:   BP (!) 156/78 (BP Location: Left Arm, Patient Position: Sitting)    Pulse 69    Resp 20    Ht 5\' 3"  (1.6 m)    Wt 230 lb (104.3 kg)    SpO2 95% Comment: ra  BMI 40.74 kg/m   General:  Obese,  well-appearing  HEENT:  Unremarkable, NCAT, PERLA, EOMI  Neck:   no JVD, no bruits, no adenopathy   Chest:   clear to auscultation, symmetrical breath sounds, no wheezes, no  rhonchi   CV:   RRR, 3/6 systolic murmur RSB, no diastolic murmur  Abdomen:  soft, non-tender, no masses   Extremities:  warm, well-perfused, pulses palpable at ankle, no lower extremity edema  Rectal/GU  Deferred  Neuro:   Grossly non-focal and symmetrical throughout  Skin:   Clean and dry, no rashes, no breakdown  Diagnostic Tests:  ECHOCARDIOGRAM REPORT         Patient Name:   JULLIE ARPS Obrien Date of Exam: 12/08/2021  Medical Rec #:  [id number]     Height:       63.0 in  Accession #:    [id number]    Weight:       233.6 lb  Date of Birth:  [birth year]     BSA:          2.066 m  Patient Age:    43 years      BP:           162/80 mmHg  Patient Gender: F             HR:           53 bpm.  Exam Location:  [facility]   Procedure: 2D Echo, Cardiac Doppler, Color Doppler and Strain Analysis   Indications:    I35.0 (ICD-10-CM) - Aortic valve stenosis, mild     History:        Patient has prior history of Echocardiogram examinations,  most                  recent 04/11/2020. CAD, Signs/Symptoms:Chest Pain; Risk                  Factors:Hypertension, Diabetes, Non-Smoker and  Dyslipidemia.     Sonographer:    Leavy Cella RDCS  Referring Phys: [id number] [place]     1. Left ventricular ejection fraction, by estimation, is 55 to 60%. The  left ventricle has normal function. The left ventricle has no regional  wall motion abnormalities. There is mild left ventricular hypertrophy.  Left ventricular diastolic parameters  are consistent with Grade II diastolic dysfunction (pseudonormalization).   2. Right ventricular systolic function is normal. The right ventricular  size is mildly enlarged. There is normal pulmonary artery systolic  pressure. The estimated right ventricular systolic pressure is 01.6 mmHg.   3. Left atrial size was mild to moderately dilated.   4. The mitral valve is abnormal. Mild mitral valve regurgitation.   5. The aortic valve is functionally  bicuspid. There is moderate  calcification of the aortic valve. Aortic valve regurgitation is trivial.  Moderate to severe aortic valve stenosis. Aortic regurgitation PHT  measures 469 msec. Aortic valve mean gradient  measures 28.0 mmHg. Dimentionless index 0.25.   6. The inferior vena cava is normal in size with greater than 50%  respiratory variability, suggesting right atrial pressure of 3 mmHg.   Comparison(s): Prior images reviewed side by side. Aortic stenosis has  progressed with mean gradient now 28 mmHg up from 21 mmHg.   FINDINGS   Left Ventricle: Left ventricular ejection fraction, by estimation, is 55  to 60%. The left ventricle has normal function. The left ventricle has no  regional wall motion abnormalities.  The left ventricular internal cavity  size was normal in size. There is   mild left ventricular hypertrophy. Left ventricular diastolic parameters  are consistent with Grade II diastolic dysfunction (pseudonormalization).   Right Ventricle: The right ventricular size is mildly enlarged. No  increase in right ventricular wall thickness. Right ventricular systolic  function is normal. There is normal pulmonary artery systolic pressure.  The tricuspid regurgitant velocity is 2.63   m/s, and with an assumed right atrial pressure of 3 mmHg, the estimated  right ventricular systolic pressure is 96.2 mmHg.   Left Atrium: Left atrial size was mild to moderately dilated.   Right Atrium: Right atrial size was normal in size.   Pericardium: There is no evidence of pericardial effusion.   Mitral Valve: The mitral valve is abnormal. There is mild thickening of  the mitral valve leaflet(s). Mild to moderate mitral annular  calcification. Mild mitral valve regurgitation.   Tricuspid Valve: The tricuspid valve is grossly normal. Tricuspid valve  regurgitation is trivial.   Aortic Valve: The aortic valve is bicuspid. There is moderate  calcification of the aortic valve.  There is mild aortic valve annular  calcification. Aortic valve regurgitation is trivial. Aortic regurgitation  PHT measures 469 msec. Moderate to severe  aortic stenosis is present. Aortic valve mean gradient measures 28.0 mmHg.  Aortic valve peak gradient measures 43.3 mmHg. Aortic valve area, by VTI  measures 0.71 cm.   Pulmonic Valve: The pulmonic valve was grossly normal. Pulmonic valve  regurgitation is trivial.   Aorta: The aortic root is normal in size and structure.   Venous: The inferior vena cava is normal in size with greater than 50%  respiratory variability, suggesting right atrial pressure of 3 mmHg.   IAS/Shunts: No atrial level shunt detected by color flow Doppler.      LEFT VENTRICLE  PLAX 2D  LVIDd:         5.12 cm     Diastology  LVIDs:         3.63 cm     LV e' medial:    5.33 cm/s  LV PW:         1.20 cm     LV E/e' medial:  22.9  LV IVS:        1.21 cm     LV e' lateral:   4.20 cm/s  LVOT diam:     1.90 cm     LV E/e' lateral: 29.0  LV SV:         68  LV SV Index:   33  LVOT Area:     2.84 cm     LV Volumes (MOD)  LV vol d, MOD A2C: 95.7 ml  LV vol d, MOD A4C: 88.0 ml  LV vol s, MOD A2C: 29.5 ml  LV vol s, MOD A4C: 30.2 ml  LV SV MOD A2C:     66.2 ml  LV SV MOD A4C:     88.0 ml  LV SV MOD BP:      61.5 ml   RIGHT VENTRICLE  RV Basal diam:  3.33 cm  RV Mid diam:    2.34 cm  RV S prime:     7.50 cm/s  TAPSE (M-mode): 1.3 cm   LEFT ATRIUM             Index        RIGHT ATRIUM           Index  LA diam:  4.80 cm 2.32 cm/m   RA Area:     10.90 cm  LA Vol (A2C):   68.0 ml 32.92 ml/m  RA Volume:   21.10 ml  10.22 ml/m  LA Vol (A4C):   85.1 ml 41.20 ml/m  LA Biplane Vol: 78.7 ml 38.10 ml/m   AORTIC VALVE  AV Area (Vmax):    0.76 cm  AV Area (Vmean):   0.71 cm  AV Area (VTI):     0.71 cm  AV Vmax:           329.00 cm/s  AV Vmean:          250.000 cm/s  AV VTI:            0.951 m  AV Peak Grad:      43.3 mmHg  AV Mean Grad:       28.0 mmHg  LVOT Vmax:         88.10 cm/s  LVOT Vmean:        62.600 cm/s  LVOT VTI:          0.239 m  LVOT/AV VTI ratio: 0.25  AI PHT:            469 msec     AORTA  Ao Asc diam: 3.60 cm   MITRAL VALVE                TRICUSPID VALVE  MV Area (PHT): 1.96 cm     TR Peak grad:   27.7 mmHg  MV Decel Time: 387 msec     TR Vmax:        263.00 cm/s  MR Peak grad: 76.7 mmHg  MR Vmax:      438.00 cm/s   SHUNTS  MV E velocity: 122.00 cm/s  Systemic VTI:  0.24 m  MV A velocity: 82.70 cm/s   Systemic Diam: 1.90 cm  MV E/A ratio:  1.48   Rozann Lesches MD  Electronically signed by Rozann Lesches MD  Signature Date/Time: 12/08/2021/10:29:32 AM         Final     Physicians  Panel Physicians Referring Physician Case Authorizing Physician  Sherren Mocha, MD (Primary)     Procedures  RIGHT/LEFT HEART CATH AND CORONARY/GRAFT ANGIOGRAPHY   Conclusion      Prox RCA lesion is 40% stenosed.   Mid RCA lesion is 40% stenosed.   Dist LM lesion is 70% stenosed.   Ost Cx lesion is 50% stenosed.   Ost LAD to Prox LAD lesion is 75% stenosed.   Origin to Prox Graft lesion is 100% stenosed.   1.  Moderately severe 70% distal left main stenosis 2.  Severe 75% proximal LAD stenosis 3.  Moderate 50% proximal circumflex stenosis 4.  Patent RCA with mild plaquing, dominant vessel 5. Continued patency of the LIMA-LAD 6. Chronic occlusion of the SVG-OM 7.  Severe calcific aortic stenosis with peak to peak gradient of 66 mmHg. 8.  Mildly elevated right heart pressures, suspect secondary to left heart disease   Recommendations: Medical therapy for CAD, TAVR evaluation for treatment of severe aortic stenosis.   Procedural Details  Technical Details INDICATION: 66 year old morbidly obese woman with coronary artery disease who underwent two-vessel CABG in 2015 for severe distal left main stenosis.  She has developed progressive symptoms of severe aortic stenosis and presents for right and left  heart catheterization as part of her evaluation to determine treatment options.  PROCEDURAL DETAILS: There was an indwelling IV in a right  antecubital vein. Using normal sterile technique, the IV was changed out for a 5 Fr brachial sheath over a 0.018 inch wire.  Attempts were made to perform right heart catheterization through the arm but the Swan-Ganz catheter would not advance.  A venogram was performed and there are collaterals throughout the right arm but no central access through the venous system.  Attention is then turned to the left radial where left radial access is obtained.  Intra-arterial verapamil was administered through the radial artery sheath.  I was able to advance the wire and JR4 catheter into the descending thoracic aorta but because of steep angulation at the subclavian aorta junction, the catheter would not advance into the ascending aorta.  Attention was then turned to the right groin.  Direct ultrasound guidance is used.  A micropuncture technique is used and 5 [place] sheaths were inserted in the right femoral vein and right femoral artery.  Ultrasound images were digitally captured and stored in the patient's chart.  Right heart catheterization is performed with a 5 French Swan-Ganz catheter.  Standard protocol was followed for recording of right heart pressures and sampling of oxygen saturations. Fick cardiac output was calculated. Standard Judkins catheters were used for selective coronary angiography and bypass graft angiography.  The aortic valve is crossed with an AL-1 catheter and straight tip wire. LV pressure is recorded and an aortic valve pullback is performed.  Mynx closure devices were used for right femoral arterial and venous hemostasis.  A TR band is used for left radial artery hemostasis.      Estimated blood loss <50 mL.   During this procedure medications were administered to achieve and maintain moderate conscious sedation while the patient's heart rate, blood  pressure, and oxygen saturation were continuously monitored and I was present face-to-face 100% of this time.   Medications (Filter: Administrations occurring from 0723 to 0902 on 12/29/21)  important  Continuous medications are totaled by the amount administered until 12/29/21 0902.   fentaNYL (SUBLIMAZE) injection (mcg) Total dose:  50 mcg Date/Time Rate/Dose/Volume Action   12/29/21 0749 25 mcg Given   0815 25 mcg Given    midazolam (VERSED) injection (mg) Total dose:  3 mg Date/Time Rate/Dose/Volume Action   12/29/21 0749 2 mg Given   0814 1 mg Given    lidocaine (PF) (XYLOCAINE) 1 % injection (mL) Total volume:  14 mL Date/Time Rate/Dose/Volume Action   12/29/21 0755 2 mL Given   0802 2 mL Given   0814 10 mL Given    Radial Cocktail/Verapamil only (mL) Total volume:  10 mL Date/Time Rate/Dose/Volume Action   12/29/21 0805 10 mL Given    iohexol (OMNIPAQUE) 350 MG/ML injection (mL) Total volume:  75 mL Date/Time Rate/Dose/Volume Action   12/29/21 0840 75 mL Given    Heparin (Porcine) in NaCl 1000-0.9 UT/500ML-% SOLN (mL) Total volume:  1,000 mL Date/Time Rate/Dose/Volume Action   12/29/21 0839 500 mL Given   0840 500 mL Given    0.9% sodium chloride infusion (mL/kg/hr) Total volume:  6.32 mL Dosing weight:  104.3 Date/Time Rate/Dose/Volume Action   12/29/21 0859 1 mL/kg/hr - 104.3 mL/hr Rate/Dose Change    Sedation Time  Sedation Time Physician-1: 57 minutes 53 seconds Contrast  Medication Name Total Dose  iohexol (OMNIPAQUE) 350 MG/ML injection 75 mL   Radiation/Fluoro  Fluoro time: 11.1 (min) DAP: 32176 (mGycm2) Cumulative Air Kerma: 694 (mGy) Complications  Complications documented before study signed (12/29/2021  5:03 AM)   No complications were associated  with this study.  Documented by Chana Bode, RN - 12/29/2021  8:56 AM     Coronary Findings  Diagnostic Dominance: Right Left Main  Dist LM lesion is 70% stenosed. 70% distal left main  stenosis    Left Anterior Descending  Ost LAD to Prox LAD lesion is 75% stenosed. The lesion is eccentric. The lesion is moderately calcified. Complex eccentric, calcific stenosis is present in the proximal LAD, progressive from the previous catheterization study predating her bypasssurgery.    Left Circumflex  There is moderately severe distal left main stenosis extending into the proximal circumflex where there is 50% stenosis. The AV circumflex has mild luminal irregularities and supplies an obtuse marginal branch that divides into twin vessels and a small posterolateral branch.  Ost Cx lesion is 50% stenosed.    Right Coronary Artery  There is mild diffuse disease throughout the vessel.  Prox RCA lesion is 40% stenosed.  Mid RCA lesion is 40% stenosed.    LIMA Graft To Mid LAD    Graft To 1st Mrg  Origin to Prox Graft lesion is 100% stenosed.    Intervention   No interventions have been documented.   Coronary Diagrams  Diagnostic Dominance: Right Intervention  Implants     Vascular Products  Closure Mynx Control 70f - [id number] - Implanted Inventory item: CLOSURE [facility] CONTROL 58F Model/Cat number: [id number]  Manufacturer: [employer] Lot number: [id number]  Device identifier: [id number] Device identifier type: GS1  GUDID Information  Request status Successful    Brand name: MYNX CONTROL Version/Model: MC9470  Company name: Agency. MRI safety info as of 12/29/21: MR Safe  Contains dry or latex rubber: No    GMDN P.T. name: Wound hydrogel dressing, non-antimicrobial     As of 12/29/2021  Status: Implanted       Syngo Images   Show images for CARDIAC CATHETERIZATION Images on Long Term Storage   Show images for Angeli, Demilio Link to Procedure Log  Procedure Log    Hemo Data  Flowsheet Row Most Recent Value  Fick Cardiac Output 6.27 L/min  Fick Cardiac Output Index 3.06 (L/min)/BSA  RA A Wave 8 mmHg  RA V Wave 11 mmHg  RA Mean 8 mmHg   RV Systolic Pressure 49 mmHg  RV Diastolic Pressure 3 mmHg  RV EDP 8 mmHg  PA Systolic Pressure 46 mmHg  PA Diastolic Pressure 20 mmHg  PA Mean 32 mmHg  PW A Wave 17 mmHg  PW V Wave 27 mmHg  PW Mean 19 mmHg  AO Systolic Pressure 962 mmHg  AO Diastolic Pressure 52 mmHg  AO Mean 75 mmHg  LV Systolic Pressure 836 mmHg  LV Diastolic Pressure 5 mmHg  LV EDP 18 mmHg  AOp Systolic Pressure 629 mmHg  AOp Diastolic Pressure 0 mmHg  AOp Mean Pressure 63 mmHg  LVp Systolic Pressure 476 mmHg  LVp Diastolic Pressure 53 mmHg  LVp EDP Pressure 58 mmHg  QP/QS 1  TPVR Index 10.46 HRUI  TSVR Index 24.51 HRUI  PVR SVR Ratio 0.19  TPVR/TSVR Ratio 0.43    ADDENDUM REPORT: 01/13/2022 13:11   CLINICAL DATA:  Aortic stenosis   EXAM: Cardiac TAVR CT   TECHNIQUE: The patient was scanned on a Siemens Force 546 slice scanner. A 120 kV retrospective scan was triggered in the descending thoracic aorta at 111 HU's. Gantry rotation speed was 270 msecs and collimation was .9 mm. No beta blockade or nitro were given. The  3D data set was reconstructed in 5% intervals of the R-R cycle. Systolic and diastolic phases were analyzed on a dedicated work station using MPR, MIP and VRT modes. The patient received 80 cc of contrast.   FINDINGS: Aortic Valve: Tri leaflet valve calcium score 1705   Aorta: Normal arch vessels no aneurysm mild calcific atherosclerosis   Sinotubular Junction: 27.4 mm   Ascending Thoracic Aorta: 3.5 cm   Aortic Arch: 28 mm   Descending Thoracic Aorta: 24 mm   Sinus of Valsalva Measurements:   Non-coronary: 26.7  mm   Right - coronary: 28.4 mm   Left - coronary: 28.8 mm   Coronary Artery Height above Annulus:   Left Main: 11.5 mm   Right Coronary: 12.9 mm above annulus   Virtual Basal Annulus Measurements:   Maximum/Minimum Diameter: 24.6 mm x 19.9 mm   Perimeter: 72.7 mm   Area: 400 mm2   Coronary Arteries: Patient LIMA to LAD Occluded SVG to OM    Optimum Fluoroscopic Angle for Delivery: LAO 9 Cranial 4 degrees   IMPRESSION: 1. Tri leaflet AV with calcium score 1705   2.  Annular area of 400 mm2 suitable for a 23 mm Sapien 3   3. Optimum angiographic angle for deployment LAO 9 Cranial 4 degrees   4.  Normal ascending thoracic aorta 3.5 cm   5.  Occluded SVG to OM and patent LIMA to LAD   6.  Membranous Septum length 6.3 mm   Jenkins Rouge     Electronically Signed   By: Jenkins Rouge M.D.   On: 01/13/2022 13:11    Addended by Josue Hector, MD on 01/13/2022  1:13 PM   Study Result  Narrative & Impression  EXAM: OVER-READ INTERPRETATION  CT CHEST   The following report is an over-read performed by radiologist Dr. Vinnie Langton of [facility], [place] on 01/13/2022. This over-read does not include interpretation of cardiac or coronary anatomy or pathology. The coronary calcium score/coronary CTA interpretation by the cardiologist is attached.   COMPARISON:  Chest CTA 06/17/2015.   FINDINGS: Extracardiac findings will be described separately under dictation for contemporaneously obtained CTA chest, abdomen and pelvis.   IMPRESSION: Please see separate dictation for contemporaneously obtained CTA chest, abdomen and pelvis dated 01/13/2022 for full description of relevant extracardiac findings.   Electronically Signed: By: Vinnie Langton M.D. On: 01/13/2022 11:11    Narrative & Impression  CLINICAL DATA:  66 year old female with history of severe aortic stenosis. Preprocedural study prior to potential transcatheter aortic valve replacement (TAVR) procedure.   EXAM: CT ANGIOGRAPHY CHEST, ABDOMEN AND PELVIS   TECHNIQUE: Multidetector CT imaging through the chest, abdomen and pelvis was performed using the standard protocol during bolus administration of intravenous contrast. Multiplanar reconstructed images and MIPs were obtained and reviewed to evaluate the vascular anatomy.   RADIATION DOSE  REDUCTION: This exam was performed according to the departmental dose-optimization program which includes automated exposure control, adjustment of the mA and/or kV according to patient size and/or use of iterative reconstruction technique.   CONTRAST:  149mL OMNIPAQUE IOHEXOL 350 MG/ML SOLN   COMPARISON:  Chest CTA 06/17/2015.   FINDINGS: CTA CHEST FINDINGS   Cardiovascular: Heart size is normal. There is no significant pericardial fluid, thickening or pericardial calcification. There is aortic atherosclerosis, as well as atherosclerosis of the great vessels of the mediastinum and the coronary arteries, including calcified atherosclerotic plaque in the left main, left anterior descending, left circumflex and right coronary arteries. Status  post median sternotomy for CABG including LIMA to the LAD. Severe thickening and calcification of the aortic valve.   Mediastinum/Lymph Nodes: No pathologically enlarged mediastinal or hilar lymph nodes. Esophagus is unremarkable in appearance. No axillary lymphadenopathy.   Lungs/Pleura: 9 mm left upper lobe pulmonary nodule (axial image 33 of series 5), similar in retrospect compared to prior chest CT from 2016 which point this was obscured by overlying airspace disease. No other suspicious appearing pulmonary nodules or masses are noted. No acute consolidative airspace disease. No pleural effusions.   Musculoskeletal/Soft Tissues: Median sternotomy wires. There are no aggressive appearing lytic or blastic lesions noted in the visualized portions of the skeleton.   CTA ABDOMEN AND PELVIS FINDINGS   Hepatobiliary: Diffuse low attenuation throughout the hepatic parenchyma, indicative of hepatic steatosis. No suspicious cystic or solid hepatic lesions. No intra or extrahepatic biliary ductal dilatation. Gallbladder is not visualized, presumably surgically absent.   Pancreas: No pancreatic mass. No pancreatic ductal dilatation.  No pancreatic or peripancreatic fluid collections or inflammatory changes.   Spleen: Unremarkable.   Adrenals/Urinary Tract: Mild multifocal cortical thinning in the kidneys bilaterally, presumably chronic post infectious or inflammatory scarring. No suspicious renal lesions. No hydroureteronephrosis. Urinary bladder is largely obscured by beam hardening artifact from the patient's bilateral hip arthroplasties.   Stomach/Bowel: The appearance of the stomach is normal. There is no pathologic dilatation of small bowel or colon. The appendix is not confidently identified and may be surgically absent. Regardless, there are no inflammatory changes noted adjacent to the cecum to suggest the presence of an acute appendicitis at this time.   Vascular/Lymphatic: Aortic atherosclerosis, with vascular findings and measurements pertinent to potential T AVR procedure, as detailed below. No aneurysm or dissection noted in the abdominal or pelvic vasculature. No lymphadenopathy noted in the abdomen or pelvis.   Reproductive: Status post hysterectomy. Right ovary is unremarkable in appearance. In the left adnexal region (axial image 205 of series 4 and coronal image 85 of series 6) there is a 3.6 x 2.8 x 2.9 cm intermediate attenuation lesion which is incompletely characterized.   Other: No significant volume of ascites.  No pneumoperitoneum.   Musculoskeletal: Status post bilateral total hip arthroplasties. There are no aggressive appearing lytic or blastic lesions noted in the visualized portions of the skeleton.   VASCULAR MEASUREMENTS PERTINENT TO TAVR:   AORTA:   Minimal Aortic Diameter-9 x 8 mm   Severity of Aortic Calcification-severe   RIGHT PELVIS:   Right Common Iliac Artery -   Minimal Diameter-7.5 x 4.7 mm   Tortuosity-mild   Calcification-severe   Right External Iliac Artery -   Minimal Diameter-6.7 x 6.8 mm   Tortuosity-mild   Calcification-none   Right  Common Femoral Artery -   Nondiagnostic quality secondary to extensive beam hardening artifact from bilateral hip arthroplasties.   LEFT PELVIS:   Left Common Iliac Artery -   Minimal Diameter-6.6 x 6.2 mm   Tortuosity-mild   Calcification-moderate   Left External Iliac Artery -   Minimal Diameter-6.9 x 6.9 mm   Tortuosity-mild   Calcification-none   Left Common Femoral Artery -   Nondiagnostic quality on multiplanar reconstructions secondary to extensive beam hardening artifact from bilateral hip arthroplasties. On axial source images, the vessel appears patent with a mean diameter of approximately 6 mm.   Review of the MIP images confirms the above findings.   IMPRESSION: 1. Vascular findings and measurements pertinent to potential TAVR procedure, as detailed above. There is a very  limited evaluation of the common femoral arteries bilaterally secondary to beam hardening artifact from the patient's bilateral hip arthroplasties. 2. Severe thickening and calcification of the aortic valve, compatible with reported clinical history of severe aortic stenosis. 3. 9 mm left upper lobe pulmonary nodule. This appears similar in retrospect to remote prior study from 2016 at which point this was largely obscured by overlying airspace consolidation. This is strongly favored to be benign, however, one additional noncontrast chest CT is recommended in 12 months to ensure the stability of this finding. 4. Left adnexal lesion measuring 3.6 x 2.8 x 2.9 cm which is intermediate attenuation. This is abnormal in a postmenopausal patient, and further evaluation with nonemergent pelvic ultrasound is recommended in the near future to better evaluate this finding. 5. Aortic atherosclerosis, in addition to left main and three-vessel coronary artery disease. Status post median sternotomy for CABG including LIMA to the LAD. 6. Hepatic steatosis. 7. Additional incidental findings, as  above.     Electronically Signed   By: Vinnie Langton M.D.   On: 01/13/2022 12:21    Risk calculations STS:  Risk of Mortality:  2.627%  Renal Failure:  1.741%  Permanent Stroke:  1.628%  Prolonged Ventilation:  8.669%  DSW Infection:  0.171%  Reoperation:  2.188%  Morbidity or Mortality:  11.842%  Short Length of Stay:  42.645%  Long Length of Stay:  3.871%     Impression:  This 66 year old woman has stage D, severe, symptomatic aortic stenosis with New York Heart Association class II symptoms of exertional fatigue and shortness of breath consistent with chronic diastolic congestive heart failure.  I have personally reviewed her 2D echocardiogram, cardiac catheterization, and CTA studies.  Her echocardiogram shows a bicuspid aortic valve with moderate calcification with a mean gradient of 28 mmHg and a peak gradient of 43 mmHg.  Aortic valve area is 0.71 cm.  Left ventricular systolic function is normal with grade 2 diastolic dysfunction.  Stroke-volume index is low at 33.  Cardiac catheterization shows 70% distal left main stenosis as well as 75% proximal LAD stenosis.  There is about 50% proximal left circumflex stenosis.  The previous vein graft to the left circumflex is occluded but there is a patent left internal mammary graft to the LAD.  The right coronary artery is a dominant vessel with no significant stenosis.  The peak to peak gradient across the aortic valve is 66 mmHg with mildly elevated right heart pressures.  I agree that aortic valve replacement is indicated in this patient for relief of her symptoms and to prevent left ventricular deterioration.  I think that transcatheter aortic valve replacement would be the best treatment for her since she has had previous coronary bypass graft surgery and morbid obesity.  Her gated cardiac CTA shows anatomy suitable for TAVR using a 23 mm Edwards SAPIEN 3 valve or a 29 mm Medtronic Evolut FX valve.  I think a 29 mm Medtronic  valve would be better for her with a BMI of 40.7.  Her abdominal and pelvic CTA shows adequate pelvic vascular anatomy to allow transfemoral insertion.  The patient was counseled at length regarding treatment alternatives for management of severe symptomatic aortic stenosis. The risks and benefits of surgical intervention has been discussed in detail. Long-term prognosis with medical therapy was discussed. Alternative approaches such as conventional surgical aortic valve replacement, transcatheter aortic valve replacement, and palliative medical therapy were compared and contrasted at length. This discussion was placed in the context of the  patient's own specific clinical presentation and past medical history. All of her questions have been addressed.   Following the decision to proceed with transcatheter aortic valve replacement, a discussion was held regarding what types of management strategies would be attempted intraoperatively in the event of life-threatening complications, including whether or not the patient would be considered a candidate for the use of cardiopulmonary bypass and/or conversion to open sternotomy for attempted surgical intervention.  I do not think she would be a candidate for emergent sternotomy to manage any intraoperative complications since she has already had bypass surgery. The patient is aware of the fact that transient use of cardiopulmonary bypass may be necessary. The patient has been advised of a variety of complications that might develop including but not limited to risks of death, stroke, paravalvular leak, aortic dissection or other major vascular complications, aortic annulus rupture, device embolization, cardiac rupture or perforation, mitral regurgitation, acute myocardial infarction, arrhythmia, heart block or bradycardia requiring permanent pacemaker placement, congestive heart failure, respiratory failure, renal failure, pneumonia, infection, other late complications  related to structural valve deterioration or migration, or other complications that might ultimately cause a temporary or permanent loss of functional independence or other long term morbidity. The patient provides full informed consent for the procedure as described and all questions were answered.      Plan:  She will be scheduled for transfemoral TAVR using a Medtronic valve on 02/10/2022.  I spent 60 minutes performing this consultation and > 50% of this time was spent face to face counseling and coordinating the care of this patient's severe symptomatic aortic stenosis.   Gaye Pollack, MD 02/04/2022

## 2022-02-05 NOTE — Progress Notes (Signed)
Surgical Instructions    Your procedure is scheduled on 02/10/22.  Report to [facility] Main Entrance "A" at 5:30 A.M., then check in with the Admitting office.  Call this number if you have problems the morning of surgery:  [phone]   If you have any questions prior to your surgery date call [phone]: Open Monday-Friday 8am-4pm    Remember:  Do not eat or drink after midnight the night before your surgery     Take these medicines the morning of surgery with A SIP OF WATER:  NONE  Please continue taking all of your current medications through the day before surgery. On the day of surgery take no medications. Please take your prednisone as directed in the surgery letter.   As of today, STOP taking any Aleve, Naproxen, Ibuprofen, Motrin, Advil, Goody's, BC's, all herbal medications, fish oil, and all vitamins.    HOW TO MANAGE YOUR DIABETES BEFORE AND AFTER SURGERY  Why is it important to control my blood sugar before and after surgery? Improving blood sugar levels before and after surgery helps healing and can limit problems. A way of improving blood sugar control is eating a healthy diet by:  Eating less sugar and carbohydrates  Increasing activity/exercise  Talking with your doctor about reaching your blood sugar goals High blood sugars (greater than 180 mg/dL) can raise your risk of infections and slow your recovery, so you will need to focus on controlling your diabetes during the weeks before surgery. Make sure that the doctor who takes care of your diabetes knows about your planned surgery including the date and location.  How do I manage my blood sugar before surgery? Check your blood sugar at least 4 times a day, starting 2 days before surgery, to make sure that the level is not too high or low.  Check your blood sugar the morning of your surgery when you wake up and every 2 hours until you get to the Short Stay unit.  If your blood sugar is less than 70 mg/dL,  you will need to treat for low blood sugar: Do not take insulin. Treat a low blood sugar (less than 70 mg/dL) with  cup of clear juice (cranberry or apple), 4 glucose tablets, OR glucose gel. Recheck blood sugar in 15 minutes after treatment (to make sure it is greater than 70 mg/dL). If your blood sugar is not greater than 70 mg/dL on recheck, call [phone] for further instructions. Report your blood sugar to the short stay nurse when you get to Short Stay.  If you are admitted to the hospital after surgery: Your blood sugar will be checked by the staff and you will probably be given insulin after surgery (instead of oral diabetes medicines) to make sure you have good blood sugar levels. The goal for blood sugar control after surgery is 80-180 mg/dL.            Do not wear jewelry or makeup Do not wear lotions, powders, perfumes  or deodorant. Do not shave 48 hours prior to surgery.   Do not bring valuables to the hospital. Do not wear nail polish, gel polish, artificial nails, or any other type of covering on natural nails (fingers and toes) If you have artificial nails or gel coating that need to be removed by a nail salon, please have this removed prior to surgery. Artificial nails or gel coating may interfere with anesthesia's ability to adequately monitor your vital signs.  [facility] is not responsible for  any belongings or valuables. .   Do NOT Smoke (Tobacco/Vaping)  24 hours prior to your procedure  If you use a CPAP at night, you may bring your mask for your overnight stay.   Contacts, glasses, hearing aids, dentures or partials may not be worn into surgery, please bring cases for these belongings   For patients admitted to the hospital, discharge time will be determined by your treatment team.   Patients discharged the day of surgery will not be allowed to drive home, and someone needs to stay with them for 24 hours.  NO VISITORS WILL BE ALLOWED IN PRE-OP WHERE  PATIENTS ARE PREPPED FOR SURGERY.  ONLY 1 SUPPORT PERSON MAY BE PRESENT IN THE WAITING ROOM WHILE YOU ARE IN SURGERY.  IF YOU ARE TO BE ADMITTED, ONCE YOU ARE IN YOUR ROOM YOU WILL BE ALLOWED TWO (2) VISITORS. 1 (ONE) VISITOR MAY STAY OVERNIGHT BUT MUST ARRIVE TO THE ROOM BY 8pm.  Minor children may have two parents present. Special consideration for safety and communication needs will be reviewed on a case by case basis.  Special instructions:    Oral Hygiene is also important to reduce your risk of infection.  Remember - BRUSH YOUR TEETH THE MORNING OF SURGERY WITH YOUR REGULAR TOOTHPASTE   [facility]- Preparing For Surgery  Before surgery, you can play an important role. Because skin is not sterile, your skin needs to be as free of germs as possible. You can reduce the number of germs on your skin by washing with CHG (chlorahexidine gluconate) Soap before surgery.  CHG is an antiseptic cleaner which kills germs and bonds with the skin to continue killing germs even after washing.     Please do not use if you have an allergy to CHG or antibacterial soaps. If your skin becomes reddened/irritated stop using the CHG.  Do not shave (including legs and underarms) for at least 48 hours prior to first CHG shower. It is OK to shave your face.  Please follow these instructions carefully.     Shower the NIGHT BEFORE SURGERY and the MORNING OF SURGERY with CHG Soap.   If you chose to wash your hair, wash your hair first as usual with your normal shampoo. After you shampoo, rinse your hair and body thoroughly to remove the shampoo.  Then [employer] and genitals (private parts) with your normal soap and rinse thoroughly to remove soap.  After that Use CHG Soap as you would any other liquid soap. You can apply CHG directly to the skin and wash gently with a scrungie or a clean washcloth.   Apply the CHG Soap to your body ONLY FROM THE NECK DOWN.  Do not use on open wounds or open sores. Avoid contact with  your eyes, ears, mouth and genitals (private parts). Wash Face and genitals (private parts)  with your normal soap.   Wash thoroughly, paying special attention to the area where your surgery will be performed.  Thoroughly rinse your body with warm water from the neck down.  DO NOT shower/wash with your normal soap after using and rinsing off the CHG Soap.  Pat yourself dry with a CLEAN TOWEL.  Wear CLEAN PAJAMAS to bed the night before surgery  Place CLEAN SHEETS on your bed the night before your surgery  DO NOT SLEEP WITH PETS.   Day of Surgery:  Take a shower with CHG soap. Wear Clean/Comfortable clothing the morning of surgery Do not apply any deodorants/lotions.  Remember to brush your teeth WITH YOUR REGULAR TOOTHPASTE.    COVID testing  If you are going to stay overnight or be admitted after your procedure/surgery and require a pre-op COVID test, please follow these instructions after your COVID test   You are not required to quarantine however you are required to wear a well-fitting mask when you are out and around people not in your household.  If your mask becomes wet or soiled, replace with a new one.  Wash your hands often with soap and water for 20 seconds or clean your hands with an alcohol-based hand sanitizer that contains at least 60% alcohol.  Do not share personal items.  Notify your provider: if you are in close contact with someone who has COVID  or if you develop a fever of 100.4 or greater, sneezing, cough, sore throat, shortness of breath or body aches.    Please read over the following fact sheets that you were given.

## 2022-02-06 ENCOUNTER — Other Ambulatory Visit: Payer: Self-pay

## 2022-02-06 ENCOUNTER — Encounter (HOSPITAL_COMMUNITY): Payer: Self-pay

## 2022-02-06 ENCOUNTER — Encounter (HOSPITAL_COMMUNITY)
Admission: RE | Admit: 2022-02-06 | Discharge: 2022-02-06 | Disposition: A | Discharge status: Home / Self Care | Payer: Medicare HMO | Source: Ambulatory Visit | Attending: Cardiovascular Disease | Admitting: Cardiovascular Disease

## 2022-02-06 ENCOUNTER — Ambulatory Visit (HOSPITAL_COMMUNITY)
Admission: RE | Admit: 2022-02-06 | Discharge: 2022-02-06 | Disposition: A | Discharge status: Home / Self Care | Payer: Medicare HMO | Source: Ambulatory Visit | Attending: Cardiovascular Disease | Admitting: Cardiovascular Disease

## 2022-02-06 VITALS — BP 148/68 | HR 63 | Temp 98.6°F | Resp 17 | Ht 63.0 in | Wt 231.5 lb

## 2022-02-06 DIAGNOSIS — I350 Nonrheumatic aortic (valve) stenosis: Secondary | ICD-10-CM | POA: Insufficient documentation

## 2022-02-06 DIAGNOSIS — Z01818 Encounter for other preprocedural examination: Secondary | ICD-10-CM | POA: Diagnosis not present

## 2022-02-06 DIAGNOSIS — Z20822 Contact with and (suspected) exposure to covid-19: Secondary | ICD-10-CM | POA: Insufficient documentation

## 2022-02-06 DIAGNOSIS — I517 Cardiomegaly: Secondary | ICD-10-CM | POA: Diagnosis not present

## 2022-02-06 HISTORY — DX: Gastro-esophageal reflux disease without esophagitis: K21.9

## 2022-02-06 LAB — COMPREHENSIVE METABOLIC PANEL WITH GFR
ALT: 34 U/L (ref 0–44)
AST: 28 U/L (ref 15–41)
Albumin: 3.6 g/dL (ref 3.5–5.0)
Alkaline Phosphatase: 56 U/L (ref 38–126)
Anion gap: 11 (ref 5–15)
BUN: 18 mg/dL (ref 8–23)
CO2: 21 mmol/L — ABNORMAL LOW (ref 22–32)
Calcium: 8.9 mg/dL (ref 8.9–10.3)
Chloride: 110 mmol/L (ref 98–111)
Creatinine, Ser: 0.99 mg/dL (ref 0.44–1.00)
GFR, Estimated: >60 mL/min
Glucose, Bld: 196 mg/dL — ABNORMAL HIGH (ref 70–99)
Potassium: 3.7 mmol/L (ref 3.5–5.1)
Sodium: 142 mmol/L (ref 135–145)
Total Bilirubin: 0.8 mg/dL (ref 0.3–1.2)
Total Protein: 6.8 g/dL (ref 6.5–8.1)

## 2022-02-06 LAB — SARS CORONAVIRUS 2 (TAT 6-24 HRS): SARS Coronavirus 2: NEGATIVE

## 2022-02-06 LAB — SURGICAL PCR SCREEN
MRSA, PCR: NEGATIVE
Staphylococcus aureus: NEGATIVE

## 2022-02-06 LAB — BLOOD GAS, ARTERIAL
Acid-base deficit: 0.8 mmol/L (ref 0.0–2.0)
Bicarbonate: 24.3 mmol/L (ref 20.0–28.0)
Drawn by: 60286
FIO2: 21 %
O2 Saturation: 98.3 %
Patient temperature: 37
pCO2 arterial: 41 mmHg (ref 32–48)
pH, Arterial: 7.38 (ref 7.35–7.45)
pO2, Arterial: 96 mmHg (ref 83–108)

## 2022-02-06 LAB — URINALYSIS, ROUTINE W REFLEX MICROSCOPIC
Bilirubin Urine: NEGATIVE
Glucose, UA: NEGATIVE mg/dL
Hgb urine dipstick: NEGATIVE
Ketones, ur: NEGATIVE mg/dL
Leukocytes,Ua: NEGATIVE
Nitrite: NEGATIVE
Protein, ur: NEGATIVE mg/dL
Specific Gravity, Urine: 1.018 (ref 1.005–1.030)
pH: 5 (ref 5.0–8.0)

## 2022-02-06 LAB — HEMOGLOBIN A1C
Hgb A1c MFr Bld: 7.4 % — ABNORMAL HIGH (ref 4.8–5.6)
Mean Plasma Glucose: 165.68 mg/dL

## 2022-02-06 LAB — BRAIN NATRIURETIC PEPTIDE: B Natriuretic Peptide: 77.4 pg/mL (ref 0.0–100.0)

## 2022-02-06 LAB — TYPE AND SCREEN
ABO/RH(D): A POS
Antibody Screen: NEGATIVE

## 2022-02-06 LAB — CBC
HCT: 42.6 % (ref 36.0–46.0)
Hemoglobin: 13.1 g/dL (ref 12.0–15.0)
MCH: 27.9 pg (ref 26.0–34.0)
MCHC: 30.8 g/dL (ref 30.0–36.0)
MCV: 90.6 fL (ref 80.0–100.0)
Platelets: 208 10*3/uL (ref 150–400)
RBC: 4.7 MIL/uL (ref 3.87–5.11)
RDW: 14.8 % (ref 11.5–15.5)
WBC: 8.6 10*3/uL (ref 4.0–10.5)
nRBC: 0 % (ref 0.0–0.2)

## 2022-02-06 LAB — PROTIME-INR
INR: 1 (ref 0.8–1.2)
Prothrombin Time: 13.6 s (ref 11.4–15.2)

## 2022-02-06 LAB — GLUCOSE, CAPILLARY: Glucose-Capillary: 186 mg/dL — ABNORMAL HIGH (ref 70–99)

## 2022-02-06 NOTE — Progress Notes (Signed)
PCP: Joeseph Amor, MD Cardiologist:Jonathan Branch, MD  EKG:02/06/22 CXR:02/06/22 ECHO: 12/08/21 Stress Test: 10/10/21 Cardiac Cath: 12/29/21  Fasting Blood Sugar- does not have glucometer Checks Blood Sugar__0_ times a day Glucose 186 at PAT  ASA: Continue through day before surgery Blood Thinner: No  OSA/CPAP: No  Covid test 02/06/22 at PAT  Anesthesia Review: Yes, cardiac history  Patient denies shortness of breath, fever, cough, and chest pain at PAT appointment.  Patient verbalized understanding of instructions provided today at the PAT appointment.  Patient asked to review instructions at home and day of surgery.

## 2022-02-09 MED ORDER — MAGNESIUM SULFATE 50 % IJ SOLN
40.0000 meq | INTRAMUSCULAR | Status: DC
  Filled 2022-02-09: qty 9.85

## 2022-02-09 MED ORDER — NOREPINEPHRINE 4 MG/250ML-% IV SOLN
0.0000 ug/min | INTRAVENOUS | Status: DC
  Filled 2022-02-09: qty 250

## 2022-02-09 MED ORDER — CEFAZOLIN SODIUM-DEXTROSE 2-4 GM/100ML-% IV SOLN
2.0000 g | INTRAVENOUS | Status: AC
  Administered 2022-02-10: 2 g via INTRAVENOUS
  Filled 2022-02-09: qty 100

## 2022-02-09 MED ORDER — POTASSIUM CHLORIDE 2 MEQ/ML IV SOLN
80.0000 meq | INTRAVENOUS | Status: DC
  Filled 2022-02-09: qty 40

## 2022-02-09 MED ORDER — DEXMEDETOMIDINE HCL IN NACL 400 MCG/100ML IV SOLN
0.1000 ug/kg/h | INTRAVENOUS | Status: AC
  Administered 2022-02-10: 1 ug/kg/h via INTRAVENOUS
  Filled 2022-02-09: qty 100

## 2022-02-09 MED ORDER — HEPARIN 30,000 UNITS/1000 ML (OHS) CELLSAVER SOLUTION
Status: DC
  Filled 2022-02-09: qty 1000

## 2022-02-09 NOTE — H&P (Addendum)
Karina Green       [place] [address]             [phone]      Cardiothoracic Surgery Admission History and Physical   PCP is [facility]  Referring Provider is Karina Mocha, MD  Primary Cardiologist is Karina Dolly, MD   Reason for admission: Severe aortic stenosis   HPI:  The patient is a 66 year old woman with a history of hypertension, hyperlipidemia, type 2 diabetes, gout, coronary disease status post coronary bypass graft surgery x2 in 2015 by Karina Green, and aortic stenosis that has been followed by Karina Green. Her most recent echo on 12/08/2021 showed a bicuspid aortic valve with moderate calcification and thickening. The mean gradient was 28 mmHg with a peak gradient of 43 mmHg. Valve area by VTI was 0.71 cm. There is mild aortic insufficiency. Left ventricular ejection fraction was 55 to 60% with grade 2 diastolic dysfunction. Previous echocardiogram in April 2021 showed a mean gradient of 21 mmHg. The patient reports that over the past year she has had progressive exertional shortness of breath and fatigue and general tiredness. She says that she can sit down and fall asleep anytime. She denies any dizziness or syncope. She has had occasional episodes of chest pressure. She denies orthopnea and peripheral edema.       Past Medical History:  Diagnosis Date   Arthritis    Back pain    CAD (coronary artery disease), native coronary artery-70% LM stenosis 10/25/2014   Cough last 30 yrs    nonproductive, no fevers   DM type 2 (diabetes mellitus, type 2) ([place]) 10/25/2014   Gout    Hyperlipidemia    Hypertension         Past Surgical History:  Procedure Laterality Date   ABDOMINAL HYSTERECTOMY     complete   APPENDECTOMY     bladder tack     CARDIAC CATHETERIZATION  10/25/14   70% LM stensis, normal EF   CHOLECYSTECTOMY     CORONARY ARTERY BYPASS GRAFT N/A 10/29/2014   Procedure: CORONARY ARTERY BYPASS GRAFTING  (CABG) TIMES TWO USING LEFT INTERNAL MAMMARY ARTERY AND RIGHT LEG GREATER SAPHENOUS VEIN HARVESTED ENDOSCOPICALLY.; Surgeon: Karina Poot, MD; Location: [place]; Service: Open Heart Surgery; Laterality: N/A;   HERNIA REPAIR     INTRAOPERATIVE TRANSESOPHAGEAL ECHOCARDIOGRAM N/A 10/29/2014   Procedure: INTRAOPERATIVE TRANSESOPHAGEAL ECHOCARDIOGRAM; Surgeon: Karina Poot, MD; Location: [place]; Service: Open Heart Surgery; Laterality: N/A;   JOINT REPLACEMENT Right 2009   hip total   LEFT HEART CATHETERIZATION WITH CORONARY ANGIOGRAM N/A 10/26/2014   Procedure: LEFT HEART CATHETERIZATION WITH CORONARY ANGIOGRAM; Surgeon: Karina Sine, MD; Location: [facility] CATH LAB; Service: Cardiovascular; Laterality: N/A;   RIGHT/LEFT HEART CATH AND CORONARY/GRAFT ANGIOGRAPHY N/A 12/29/2021   Procedure: RIGHT/LEFT HEART CATH AND CORONARY/GRAFT ANGIOGRAPHY; Surgeon: Karina Mocha, MD; Location: [place] CV LAB; Service: Cardiovascular; Laterality: N/A;   TONSILLECTOMY     TOTAL HIP ARTHROPLASTY     TOTAL HIP ARTHROPLASTY Left 05/07/2016   Procedure: TOTAL HIP ARTHROPLASTY ANTERIOR APPROACH; Surgeon: Rod Can, MD; Location: [facility]; Service: Orthopedics; Laterality: Left;        Family History  Problem Relation Age of Onset   Hypertension Mother    Coronary artery disease Mother    CABG   Hypertension Father    CAD Father    CABG   Coronary artery disease Brother    Stent  Social History        Socioeconomic History   Marital status: Divorced    Spouse name: Not on file   Number of children: Not on file   Years of education: Not on file   Highest education level: Not on file  Occupational History   Not on file  Tobacco Use   Smoking status: Never   Smokeless tobacco: Never  Vaping Use   Vaping Use: Never used  Substance and Sexual Activity   Alcohol use: No    Alcohol/week: 0.0 standard drinks   Drug use: No   Sexual activity: Not on file  Other Topics Concern   Not on file  Social  History Narrative   Not on file   Social Determinants of Health   Financial Resource Strain: Not on file  Food Insecurity: Not on file  Transportation Needs: Not on file  Physical Activity: Not on file  Stress: Not on file  Social Connections: Not on file  Intimate Partner Violence: Not on file          Prior to Admission medications   Medication Sig Start Date End Date Taking? Authorizing Provider  allopurinol (ZYLOPRIM) 100 MG tablet Take 100 mg by mouth daily. 09/16/17  Yes [provider]  aspirin EC 81 MG tablet Take 81 mg by mouth 2 (two) times daily.   Yes [provider]  atorvastatin (LIPITOR) 80 MG tablet TAKE ONE TABLET BY MOUTH ONCE DAILY AT 6 P.M. 03/27/16  Yes Karina Green, Karina Guild, MD  furosemide (LASIX) 20 MG tablet Take 1 tablet (20 mg total) by mouth daily. 03/27/16  Yes BranchAlphonse Guild, MD  isosorbide mononitrate (IMDUR) 30 MG 24 hr tablet Take 0.5 tablets (15 mg total) by mouth daily. 12/15/17  Yes Karina Lenis, MD  losartan (COZAAR) 25 MG tablet Take 0.5 tablets (12.5 mg total) daily by mouth. 11/05/17  Yes Karina Colonel, NP  Multiple Vitamin (MULTIVITAMIN WITH MINERALS) TABS tablet Take 1 tablet by mouth daily.   Yes [provider]  Omega-3 Fatty Acids (FISH OIL) 1000 MG CAPS Take 1,000 mg by mouth daily.   Yes [provider]  potassium chloride SA (K-DUR,KLOR-CON) 20 MEQ tablet Take 1 tablet (20 mEq total) by mouth daily. NEED OV. 10/17/18  Yes Karina Colonel, NP  predniSONE (DELTASONE) 50 MG tablet Take as directed 13, 7 and 1 hour before TAVR for contrast dye allergy 02/03/22  Yes Karina Stanford, PA-C  metoprolol tartrate (LOPRESSOR) 25 MG tablet Take 0.5 tablets (12.5 mg total) by mouth 2 (two) times daily.  Patient taking differently: Take 25 mg by mouth 2 (two) times daily. 10/03/21 01/01/22  Karina Lenis, MD         Current Outpatient Medications  Medication Sig Dispense Refill   allopurinol  (ZYLOPRIM) 100 MG tablet Take 100 mg by mouth daily.     aspirin EC 81 MG tablet Take 81 mg by mouth 2 (two) times daily.     atorvastatin (LIPITOR) 80 MG tablet TAKE ONE TABLET BY MOUTH ONCE DAILY AT 6 P.M. 90 tablet 01   furosemide (LASIX) 20 MG tablet Take 1 tablet (20 mg total) by mouth daily. 90 tablet 1   isosorbide mononitrate (IMDUR) 30 MG 24 hr tablet Take 0.5 tablets (15 mg total) by mouth daily. 45 tablet 3   losartan (COZAAR) 25 MG tablet Take 0.5 tablets (12.5 mg total) daily by mouth. 45 tablet 3   Multiple Vitamin (MULTIVITAMIN WITH  MINERALS) TABS tablet Take 1 tablet by mouth daily.     Omega-3 Fatty Acids (FISH OIL) 1000 MG CAPS Take 1,000 mg by mouth daily.     potassium chloride SA (K-DUR,KLOR-CON) 20 MEQ tablet Take 1 tablet (20 mEq total) by mouth daily. NEED OV. 90 tablet 0   predniSONE (DELTASONE) 50 MG tablet Take as directed 13, 7 and 1 hour before TAVR for contrast dye allergy 3 tablet 0   metoprolol tartrate (LOPRESSOR) 25 MG tablet Take 0.5 tablets (12.5 mg total) by mouth 2 (two) times daily. (Patient taking differently: Take 25 mg by mouth 2 (two) times daily.) 90 tablet 3            Current Facility-Administered Medications  Medication Dose Route Frequency Provider Last Rate Last Admin   sodium chloride flush (NS) 0.9 % injection 3 mL 3 mL Intravenous Q12H Karina Green, Karina Guild, MD          Allergies  Allergen Reactions   Iodinated Contrast Media Rash    Very large itchy rash within 24 hours of heart cath    Review of Systems:  General: normal appetite, + decreased energy, no weight gain, no weight loss, no fever  Cardiac: + chest pain with exertion, no chest pain at rest, + SOB with moderate exertion, no resting SOB, no PND, no orthopnea, no palpitations, no arrhythmia, no atrial fibrillation, no LE edema, no dizzy spells, no syncope  Respiratory: + exertional shortness of breath, no home oxygen, no productive cough, + dry cough, no bronchitis, no wheezing, no  hemoptysis, no asthma, no pain with inspiration or cough, no sleep apnea, no CPAP at night  GI: no difficulty swallowing, no reflux, no frequent heartburn, no hiatal hernia, no abdominal pain, no constipation, no diarrhea, no hematochezia, no hematemesis, no melena  GU: no dysuria, no frequency, no urinary tract infection, no hematuria, no kidney stones, no kidney disease  Vascular: no pain suggestive of claudication, no pain in feet, + leg cramps, no varicose veins, no DVT, no non-healing foot ulcer  Neuro: no stroke, no TIA's, no seizures, no headaches, no temporary blindness one eye, no slurred speech, no peripheral neuropathy, no chronic pain, no instability of gait, no memory/cognitive dysfunction  Musculoskeletal: + arthritis, no joint swelling, no myalgias, no difficulty walking, normal mobility  Skin: no rash, no itching, no skin infections, no pressure sores or ulcerations  Psych: no anxiety, no depression, no nervousness, no unusual recent stress  Eyes: no blurry vision, + floaters, no recent vision changes, no glasses or contacts  ENT: no hearing loss, no loose or painful teeth, no dentures, last saw dentist last year  Hematologic: no easy bruising, no abnormal bleeding, no clotting disorder, no frequent epistaxis  Endocrine: + diabetes, does not check CBG's at home very often.  Physical Exam:  BP (!) 156/78 (BP Location: Left Arm, Patient Position: Sitting)   Pulse 69   Resp 20   Ht 5\' 3"  (1.6 m)   Wt 230 lb (104.3 kg)   SpO2 95% Comment: ra   BMI 40.74 kg/m  General: Obese, well-appearing  HEENT: Unremarkable, NCAT, PERLA, EOMI  Neck: no JVD, no bruits, no adenopathy  Chest: clear to auscultation, symmetrical breath sounds, no wheezes, no rhonchi  CV: RRR, 3/6 systolic murmur RSB, no diastolic murmur  Abdomen: soft, non-tender, no masses  Extremities: warm, well-perfused, pulses palpable at ankle, no lower extremity edema  Rectal/GU Deferred  Neuro: Grossly non-focal and  symmetrical throughout  Skin: Clean and  dry, no rashes, no breakdown  Diagnostic Tests:  ECHOCARDIOGRAM REPORT     Patient Name: JAMILA SLATTEN Rigaud Date of Exam: 12/08/2021  Medical Rec #: [id number] Height: 63.0 in  Accession #: [id number] Weight: 233.6 lb  Date of Birth: 04-25-1956 BSA: 2.066 m  Patient Age: 15 years BP: 162/80 mmHg  Patient Gender: F HR: 53 bpm.  Exam Location: [facility]   Procedure: 2D Echo, Cardiac Doppler, Color Doppler and Strain Analysis   Indications: I35.0 (ICD-10-CM) - Aortic valve stenosis, mild   History: Patient has prior history of Echocardiogram examinations,  most  recent 04/11/2020. CAD, Signs/Symptoms:Chest Pain; Risk  Factors:Hypertension, Diabetes, Non-Smoker and  Dyslipidemia.   Sonographer: Leavy Cella RDCS  Referring Phys: [id number] [place]    1. Left ventricular ejection fraction, by estimation, is 55 to 60%. The  left ventricle has normal function. The left ventricle has no regional  wall motion abnormalities. There is mild left ventricular hypertrophy.  Left ventricular diastolic parameters  are consistent with Grade II diastolic dysfunction (pseudonormalization).  2. Right ventricular systolic function is normal. The right ventricular  size is mildly enlarged. There is normal pulmonary artery systolic  pressure. The estimated right ventricular systolic pressure is 55.7 mmHg.  3. Left atrial size was mild to moderately dilated.  4. The mitral valve is abnormal. Mild mitral valve regurgitation.  5. The aortic valve is functionally bicuspid. There is moderate  calcification of the aortic valve. Aortic valve regurgitation is trivial.  Moderate to severe aortic valve stenosis. Aortic regurgitation PHT  measures 469 msec. Aortic valve mean gradient  measures 28.0 mmHg. Dimentionless index 0.25.  6. The inferior vena cava is normal in size with greater than 50%  respiratory variability, suggesting right atrial pressure  of 3 mmHg.   Comparison(s): Prior images reviewed side by side. Aortic stenosis has  progressed with mean gradient now 28 mmHg up from 21 mmHg.   FINDINGS  Left Ventricle: Left ventricular ejection fraction, by estimation, is 55  to 60%. The left ventricle has normal function. The left ventricle has no  regional wall motion abnormalities. The left ventricular internal cavity  size was normal in size. There is  mild left ventricular hypertrophy. Left ventricular diastolic parameters  are consistent with Grade II diastolic dysfunction (pseudonormalization).   Right Ventricle: The right ventricular size is mildly enlarged. No  increase in right ventricular wall thickness. Right ventricular systolic  function is normal. There is normal pulmonary artery systolic pressure.  The tricuspid regurgitant velocity is 2.63  m/s, and with an assumed right atrial pressure of 3 mmHg, the estimated  right ventricular systolic pressure is 32.2 mmHg.   Left Atrium: Left atrial size was mild to moderately dilated.   Right Atrium: Right atrial size was normal in size.   Pericardium: There is no evidence of pericardial effusion.   Mitral Valve: The mitral valve is abnormal. There is mild thickening of  the mitral valve leaflet(s). Mild to moderate mitral annular  calcification. Mild mitral valve regurgitation.   Tricuspid Valve: The tricuspid valve is grossly normal. Tricuspid valve  regurgitation is trivial.   Aortic Valve: The aortic valve is bicuspid. There is moderate  calcification of the aortic valve. There is mild aortic valve annular  calcification. Aortic valve regurgitation is trivial. Aortic regurgitation  PHT measures 469 msec. Moderate to severe  aortic stenosis is present. Aortic valve mean gradient measures 28.0 mmHg.  Aortic valve peak gradient measures 43.3 mmHg. Aortic valve area,  by VTI  measures 0.71 cm.   Pulmonic Valve: The pulmonic valve was grossly normal. Pulmonic valve   regurgitation is trivial.   Aorta: The aortic root is normal in size and structure.   Venous: The inferior vena cava is normal in size with greater than 50%  respiratory variability, suggesting right atrial pressure of 3 mmHg.   IAS/Shunts: No atrial level shunt detected by color flow Doppler.    LEFT VENTRICLE  PLAX 2D  LVIDd: 5.12 cm Diastology  LVIDs: 3.63 cm LV e' medial: 5.33 cm/s  LV PW: 1.20 cm LV E/e' medial: 22.9  LV IVS: 1.21 cm LV e' lateral: 4.20 cm/s  LVOT diam: 1.90 cm LV E/e' lateral: 29.0  LV SV: 68  LV SV Index: 33  LVOT Area: 2.84 cm   LV Volumes (MOD)  LV vol d, MOD A2C: 95.7 ml  LV vol d, MOD A4C: 88.0 ml  LV vol s, MOD A2C: 29.5 ml  LV vol s, MOD A4C: 30.2 ml  LV SV MOD A2C: 66.2 ml  LV SV MOD A4C: 88.0 ml  LV SV MOD BP: 61.5 ml   RIGHT VENTRICLE  RV Basal diam: 3.33 cm  RV Mid diam: 2.34 cm  RV S prime: 7.50 cm/s  TAPSE (M-mode): 1.3 cm   LEFT ATRIUM Index RIGHT ATRIUM Index  LA diam: 4.80 cm 2.32 cm/m RA Area: 10.90 cm  LA Vol (A2C): 68.0 ml 32.92 ml/m RA Volume: 21.10 ml 10.22 ml/m  LA Vol (A4C): 85.1 ml 41.20 ml/m  LA Biplane Vol: 78.7 ml 38.10 ml/m  AORTIC VALVE  AV Area (Vmax): 0.76 cm  AV Area (Vmean): 0.71 cm  AV Area (VTI): 0.71 cm  AV Vmax: 329.00 cm/s  AV Vmean: 250.000 cm/s  AV VTI: 0.951 m  AV Peak Grad: 43.3 mmHg  AV Mean Grad: 28.0 mmHg  LVOT Vmax: 88.10 cm/s  LVOT Vmean: 62.600 cm/s  LVOT VTI: 0.239 m  LVOT/AV VTI ratio: 0.25  AI PHT: 469 msec   AORTA  Ao Asc diam: 3.60 cm   MITRAL VALVE TRICUSPID VALVE  MV Area (PHT): 1.96 cm TR Peak grad: 27.7 mmHg  MV Decel Time: 387 msec TR Vmax: 263.00 cm/s  MR Peak grad: 76.7 mmHg  MR Vmax: 438.00 cm/s SHUNTS  MV E velocity: 122.00 cm/s Systemic VTI: 0.24 m  MV A velocity: 82.70 cm/s Systemic Diam: 1.90 cm  MV E/A ratio: 1.48   Karina Lesches MD  Electronically signed by Karina Lesches MD  Signature Date/Time: 12/08/2021/10:29:32 AM     Final   Physicians  Panel Physicians Referring Physician Case Authorizing Physician  Karina Mocha, MD (Primary)    Procedures  RIGHT/LEFT HEART CATH AND CORONARY/GRAFT ANGIOGRAPHY  Conclusion   Prox RCA lesion is 40% stenosed.   Mid RCA lesion is 40% stenosed.   Dist LM lesion is 70% stenosed.   Ost Cx lesion is 50% stenosed.   Ost LAD to Prox LAD lesion is 75% stenosed.   Origin to Prox Graft lesion is 100% stenosed.  1. Moderately severe 70% distal left main stenosis 2. Severe 75% proximal LAD stenosis 3. Moderate 50% proximal circumflex stenosis  4. Patent RCA with mild plaquing, dominant vessel  5. Continued patency of the LIMA-LAD  6. Chronic occlusion of the SVG-OM  7. Severe calcific aortic stenosis with peak to peak gradient of 66 mmHg. 8. Mildly elevated right heart pressures, suspect secondary to left heart disease  Recommendations: Medical therapy for CAD, TAVR evaluation for treatment  of severe aortic stenosis.  Procedural Details  Technical Details INDICATION: 66 year old morbidly obese woman with coronary artery disease who underwent two-vessel CABG in 2015 for severe distal left main stenosis. She has developed progressive symptoms of severe aortic stenosis and presents for right and left heart catheterization as part of her evaluation to determine treatment options.  PROCEDURAL DETAILS: There was an indwelling IV in a right antecubital vein. Using normal sterile technique, the IV was changed out for a 5 Fr brachial sheath over a 0.018 inch wire. Attempts were made to perform right heart catheterization through the arm but the Swan-Ganz catheter would not advance. A venogram was performed and there are collaterals throughout the right arm but no central access through the venous system. Attention is then turned to the left radial where left radial access is obtained. Intra-arterial verapamil was administered through the radial artery sheath. I was able to advance the wire and JR4  catheter into the descending thoracic aorta but because of steep angulation at the subclavian aorta junction, the catheter would not advance into the ascending aorta. Attention was then turned to the right groin. Direct ultrasound guidance is used. A micropuncture technique is used and 5 [place] sheaths were inserted in the right femoral vein and right femoral artery. Ultrasound images were digitally captured and stored in the patient's chart. Right heart catheterization is performed with a 5 French Swan-Ganz catheter. Standard protocol was followed for recording of right heart pressures and sampling of oxygen saturations. Fick cardiac output was calculated. Standard Judkins catheters were used for selective coronary angiography and bypass graft angiography. The aortic valve is crossed with an AL-1 catheter and straight tip wire. LV pressure is recorded and an aortic valve pullback is performed. Mynx closure devices were used for right femoral arterial and venous hemostasis. A TR band is used for left radial artery hemostasis.     Estimated blood loss <50 mL.   During this procedure medications were administered to achieve and maintain moderate conscious sedation while the patient's heart rate, blood pressure, and oxygen saturation were continuously monitored and I was present face-to-face 100% of this time.  Medications  (Filter: Administrations occurring from 0723 to 0902 on 12/29/21)  important Continuous medications are totaled by the amount administered until 12/29/21 0902.  fentaNYL (SUBLIMAZE) injection (mcg)  Total dose: 50 mcg  Date/Time Rate/Dose/Volume Action   12/29/21 0749 25 mcg Given   0815 25 mcg Given   midazolam (VERSED) injection (mg)  Total dose: 3 mg  Date/Time Rate/Dose/Volume Action   12/29/21 0749 2 mg Given   0814 1 mg Given   lidocaine (PF) (XYLOCAINE) 1 % injection (mL)  Total volume: 14 mL  Date/Time Rate/Dose/Volume Action   12/29/21 0755 2 mL Given   0802 2 mL  Given   0814 10 mL Given   Radial Cocktail/Verapamil only (mL)  Total volume: 10 mL  Date/Time Rate/Dose/Volume Action   12/29/21 0805 10 mL Given   iohexol (OMNIPAQUE) 350 MG/ML injection (mL)  Total volume: 75 mL  Date/Time Rate/Dose/Volume Action   12/29/21 0840 75 mL Given   Heparin (Porcine) in NaCl 1000-0.9 UT/500ML-% SOLN (mL)  Total volume: 1,000 mL  Date/Time Rate/Dose/Volume Action   12/29/21 0839 500 mL Given   0840 500 mL Given   0.9% sodium chloride infusion (mL/kg/hr)  Total volume: 6.32 mL Dosing weight: 104.3  Date/Time Rate/Dose/Volume Action   12/29/21 0859 1 mL/kg/hr - 104.3 mL/hr Rate/Dose Change   Sedation Time  Sedation Time Physician-1:  57 minutes 53 seconds  Contrast  Medication Name Total Dose  iohexol (OMNIPAQUE) 350 MG/ML injection 75 mL  Radiation/Fluoro  Fluoro time: 11.1 (min)  DAP: 32176 (mGycm2)  Cumulative Air Kerma: 833 (mGy)  Complications     Complications documented before study signed (12/29/2021 8:25 AM)    No complications were associated with this study.   Documented by Karina Bode, RN - 12/29/2021 8:56 AM   Coronary Findings  Diagnostic  Dominance: Right  Left Main  Dist LM lesion is 70% stenosed. 70% distal left main stenosis  Left Anterior Descending  Ost LAD to Prox LAD lesion is 75% stenosed. The lesion is eccentric. The lesion is moderately calcified. Complex eccentric, calcific stenosis is present in the proximal LAD, progressive from the previous catheterization study predating her bypasssurgery.  Left Circumflex  There is moderately severe distal left main stenosis extending into the proximal circumflex where there is 50% stenosis. The AV circumflex has mild luminal irregularities and supplies an obtuse marginal Karina Green that divides into twin vessels and a small posterolateral Karina Green.  Ost Cx lesion is 50% stenosed.  Right Coronary Artery  There is mild diffuse disease throughout the vessel.  Prox RCA lesion is 40% stenosed.   Mid RCA lesion is 40% stenosed.  LIMA Graft To Mid LAD  Graft To 1st Mrg  Origin to Prox Graft lesion is 100% stenosed.  Intervention   No interventions have been documented.  Coronary Diagrams  Diagnostic  Dominance: Right  Intervention  Implants     Vascular Products   Closure Mynx Control 56f - [id number] - Implanted   Inventory item: CLOSURE [facility] CONTROL 26F Model/Cat number: [id number]  Manufacturer: [employer] Lot number: [id number]  Device identifier: [id number] Device identifier type: GS1  GUDID Information   Request status Successful    Brand name: MYNX CONTROL Version/Model: [id number]  Company name: [employer]. MRI safety info as of 12/29/21: MR Safe  Contains dry or latex rubber: No    GMDN P.T. name: Wound hydrogel dressing, non-antimicrobial    As of 12/29/2021    Status: Implanted      Syngo Images  Show images for CARDIAC CATHETERIZATION  Images on Long Term Storage  Show images for Jowanna, Loeffler Link to Procedure Log    Procedure Log  Hemo Data  Flowsheet Row Most Recent Value  Fick Cardiac Output 6.27 L/min  Fick Cardiac Output Index 3.06 (L/min)/BSA  RA A Wave 8 mmHg  RA V Wave 11 mmHg  RA Mean 8 mmHg  RV Systolic Pressure 49 mmHg  RV Diastolic Pressure 3 mmHg  RV EDP 8 mmHg  PA Systolic Pressure 46 mmHg  PA Diastolic Pressure 20 mmHg  PA Mean 32 mmHg  PW A Wave 17 mmHg  PW V Wave 27 mmHg  PW Mean 19 mmHg  AO Systolic Pressure 921 mmHg  AO Diastolic Pressure 52 mmHg  AO Mean 75 mmHg  LV Systolic Pressure 194 mmHg  LV Diastolic Pressure 5 mmHg  LV EDP 18 mmHg  AOp Systolic Pressure 174 mmHg  AOp Diastolic Pressure 0 mmHg  AOp Mean Pressure 63 mmHg  LVp Systolic Pressure 081 mmHg  LVp Diastolic Pressure 53 mmHg  LVp EDP Pressure 58 mmHg  QP/QS 1  TPVR Index 10.46 HRUI  TSVR Index 24.51 HRUI  PVR SVR Ratio 0.19  TPVR/TSVR Ratio 0.43   ADDENDUM REPORT: 01/13/2022 13:11  CLINICAL DATA: Aortic stenosis  EXAM:  Cardiac TAVR CT   TECHNIQUE:  The patient was scanned on a Siemens Force 174 slice scanner. A 120  kV retrospective scan was triggered in the descending thoracic aorta  at 111 HU's. Gantry rotation speed was 270 msecs and collimation was  .9 mm. No beta blockade or nitro were given. The 3D data set was  reconstructed in 5% intervals of the R-R cycle. Systolic and  diastolic phases were analyzed on a dedicated work station using  MPR, MIP and VRT modes. The patient received 80 cc of contrast.  FINDINGS:  Aortic Valve: Tri leaflet valve calcium score 1705  Aorta: Normal arch vessels no aneurysm mild calcific atherosclerosis  Sinotubular Junction: 27.4 mm  Ascending Thoracic Aorta: 3.5 cm  Aortic Arch: 28 mm  Descending Thoracic Aorta: 24 mm  Sinus of Valsalva Measurements:  Non-coronary: 26.7 mm  Right - coronary: 28.4 mm  Left - coronary: 28.8 mm  Coronary Artery Height above Annulus:  Left Main: 11.5 mm  Right Coronary: 12.9 mm above annulus  Virtual Basal Annulus Measurements:  Maximum/Minimum Diameter: 24.6 mm x 19.9 mm  Perimeter: 72.7 mm  Area: 400 mm2  Coronary Arteries: Patient LIMA to LAD Occluded SVG to OM  Optimum Fluoroscopic Angle for Delivery: LAO 9 Cranial 4 degrees  IMPRESSION:  1. Tri leaflet AV with calcium score 1705  2. Annular area of 400 mm2 suitable for a 23 mm Sapien 3  3. Optimum angiographic angle for deployment LAO 9 Cranial 4 degrees  4. Normal ascending thoracic aorta 3.5 cm  5. Occluded SVG to OM and patent LIMA to LAD  6. Membranous Septum length 6.3 mm  Karina Green  Electronically Signed  By: Karina Green M.D.  On: 01/13/2022 13:11   Addended by Karina Hector, MD on 01/13/2022 1:13 PM  Study Result  Narrative & Impression  EXAM:  OVER-READ INTERPRETATION CT CHEST  The following report is an over-read performed by radiologist Dr.  Vinnie Green of [facility], [place] on 01/13/2022. This  over-read does not include interpretation of cardiac or  coronary  anatomy or pathology. The coronary calcium score/coronary CTA  interpretation by the cardiologist is attached.  COMPARISON: Chest CTA 06/17/2015.  FINDINGS:  Extracardiac findings will be described separately under dictation  for contemporaneously obtained CTA chest, abdomen and pelvis.  IMPRESSION:  Please see separate dictation for contemporaneously obtained CTA  chest, abdomen and pelvis dated 01/13/2022 for full description of  relevant extracardiac findings.  Electronically Signed:  By: Karina Green M.D.  On: 01/13/2022 11:11   Narrative & Impression  CLINICAL DATA: 66 year old female with history of severe aortic  stenosis. Preprocedural study prior to potential transcatheter  aortic valve replacement (TAVR) procedure.  EXAM:  CT ANGIOGRAPHY CHEST, ABDOMEN AND PELVIS  TECHNIQUE:  Multidetector CT imaging through the chest, abdomen and pelvis was  performed using the standard protocol during bolus administration of  intravenous contrast. Multiplanar reconstructed images and MIPs were  obtained and reviewed to evaluate the vascular anatomy.  RADIATION DOSE REDUCTION: This exam was performed according to the  departmental dose-optimization program which includes automated  exposure control, adjustment of the mA and/or kV according to  patient size and/or use of iterative reconstruction technique.  CONTRAST: 150mL OMNIPAQUE IOHEXOL 350 MG/ML SOLN  COMPARISON: Chest CTA 06/17/2015.  FINDINGS:  CTA CHEST FINDINGS  Cardiovascular: Heart size is normal. There is no significant  pericardial fluid, thickening or pericardial calcification. There is  aortic atherosclerosis, as well as atherosclerosis of the great  vessels of the mediastinum and the coronary  arteries, including  calcified atherosclerotic plaque in the left main, left anterior  descending, left circumflex and right coronary arteries. Status post  median sternotomy for CABG including LIMA to the LAD.  Severe  thickening and calcification of the aortic valve.  Mediastinum/Lymph Nodes: No pathologically enlarged mediastinal or  hilar lymph nodes. Esophagus is unremarkable in appearance. No  axillary lymphadenopathy.  Lungs/Pleura: 9 mm left upper lobe pulmonary nodule (axial image 33  of series 5), similar in retrospect compared to prior chest CT from  2016 which point this was obscured by overlying airspace disease. No  other suspicious appearing pulmonary nodules or masses are noted. No  acute consolidative airspace disease. No pleural effusions.  Musculoskeletal/Soft Tissues: Median sternotomy wires. There are no  aggressive appearing lytic or blastic lesions noted in the  visualized portions of the skeleton.  CTA ABDOMEN AND PELVIS FINDINGS  Hepatobiliary: Diffuse low attenuation throughout the hepatic  parenchyma, indicative of hepatic steatosis. No suspicious cystic or  solid hepatic lesions. No intra or extrahepatic biliary ductal  dilatation. Gallbladder is not visualized, presumably surgically  absent.  Pancreas: No pancreatic mass. No pancreatic ductal dilatation. No  pancreatic or peripancreatic fluid collections or inflammatory  changes.  Spleen: Unremarkable.  Adrenals/Urinary Tract: Mild multifocal cortical thinning in the  kidneys bilaterally, presumably chronic post infectious or  inflammatory scarring. No suspicious renal lesions. No  hydroureteronephrosis. Urinary bladder is largely obscured by beam  hardening artifact from the patient's bilateral hip arthroplasties.  Stomach/Bowel: The appearance of the stomach is normal. There is no  pathologic dilatation of small bowel or colon. The appendix is not  confidently identified and may be surgically absent. Regardless,  there are no inflammatory changes noted adjacent to the cecum to  suggest the presence of an acute appendicitis at this time.  Vascular/Lymphatic: Aortic atherosclerosis, with vascular findings  and  measurements pertinent to potential T AVR procedure, as detailed  below. No aneurysm or dissection noted in the abdominal or pelvic  vasculature. No lymphadenopathy noted in the abdomen or pelvis.  Reproductive: Status post hysterectomy. Right ovary is unremarkable  in appearance. In the left adnexal region (axial image 205 of series  4 and coronal image 85 of series 6) there is a 3.6 x 2.8 x 2.9 cm  intermediate attenuation lesion which is incompletely characterized.  Other: No significant volume of ascites. No pneumoperitoneum.  Musculoskeletal: Status post bilateral total hip arthroplasties.  There are no aggressive appearing lytic or blastic lesions noted in  the visualized portions of the skeleton.  VASCULAR MEASUREMENTS PERTINENT TO TAVR:  AORTA:  Minimal Aortic Diameter-9 x 8 mm  Severity of Aortic Calcification-severe  RIGHT PELVIS:  Right Common Iliac Artery -  Minimal Diameter-7.5 x 4.7 mm  Tortuosity-mild  Calcification-severe  Right External Iliac Artery -  Minimal Diameter-6.7 x 6.8 mm  Tortuosity-mild  Calcification-none  Right Common Femoral Artery -  Nondiagnostic quality secondary to extensive beam hardening artifact  from bilateral hip arthroplasties.  LEFT PELVIS:  Left Common Iliac Artery -  Minimal Diameter-6.6 x 6.2 mm  Tortuosity-mild  Calcification-moderate  Left External Iliac Artery -  Minimal Diameter-6.9 x 6.9 mm  Tortuosity-mild  Calcification-none  Left Common Femoral Artery -  Nondiagnostic quality on multiplanar reconstructions secondary to  extensive beam hardening artifact from bilateral hip arthroplasties.  On axial source images, the vessel appears patent with a mean  diameter of approximately 6 mm.  Review of the MIP images confirms the above findings.  IMPRESSION:  1.  Vascular findings and measurements pertinent to potential TAVR  procedure, as detailed above. There is a very limited evaluation of  the common femoral arteries  bilaterally secondary to beam hardening  artifact from the patient's bilateral hip arthroplasties.  2. Severe thickening and calcification of the aortic valve,  compatible with reported clinical history of severe aortic stenosis.  3. 9 mm left upper lobe pulmonary nodule. This appears similar in  retrospect to remote prior study from 2016 at which point this was  largely obscured by overlying airspace consolidation. This is  strongly favored to be benign, however, one additional noncontrast  chest CT is recommended in 12 months to ensure the stability of this  finding.  4. Left adnexal lesion measuring 3.6 x 2.8 x 2.9 cm which is  intermediate attenuation. This is abnormal in a postmenopausal  patient, and further evaluation with nonemergent pelvic ultrasound  is recommended in the near future to better evaluate this finding.  5. Aortic atherosclerosis, in addition to left main and three-vessel  coronary artery disease. Status post median sternotomy for CABG  including LIMA to the LAD.  6. Hepatic steatosis.  7. Additional incidental findings, as above.  Electronically Signed  By: Karina Green M.D.  On: 01/13/2022 12:21    Impression:   This 66 year old woman has stage D, severe, symptomatic aortic stenosis with New York Heart Association class II symptoms of exertional fatigue and shortness of breath consistent with chronic diastolic congestive heart failure. I have personally reviewed her 2D echocardiogram, cardiac catheterization, and CTA studies. Her echocardiogram shows a bicuspid aortic valve with moderate calcification with a mean gradient of 28 mmHg and a peak gradient of 43 mmHg. Aortic valve area is 0.71 cm. Left ventricular systolic function is normal with grade 2 diastolic dysfunction. Stroke-volume index is low at 33. Cardiac catheterization shows 70% distal left main stenosis as well as 75% proximal LAD stenosis. There is about 50% proximal left circumflex stenosis. The  previous vein graft to the left circumflex is occluded but there is a patent left internal mammary graft to the LAD. The right coronary artery is a dominant vessel with no significant stenosis. The peak to peak gradient across the aortic valve is 66 mmHg with mildly elevated right heart pressures. I agree that aortic valve replacement is indicated in this patient for relief of her symptoms and to prevent left ventricular deterioration. I think that transcatheter aortic valve replacement would be the best treatment for her since she has had previous coronary bypass graft surgery and morbid obesity. Her gated cardiac CTA shows anatomy suitable for TAVR using a 23 mm Edwards SAPIEN 3 valve or a 29 mm Medtronic Evolut FX valve. I think a 29 mm Medtronic valve would be better for her with a BMI of 40.7. Her abdominal and pelvic CTA shows adequate pelvic vascular anatomy to allow transfemoral insertion.  The patient was counseled at length regarding treatment alternatives for management of severe symptomatic aortic stenosis. The risks and benefits of surgical intervention has been discussed in detail. Long-term prognosis with medical therapy was discussed. Alternative approaches such as conventional surgical aortic valve replacement, transcatheter aortic valve replacement, and palliative medical therapy were compared and contrasted at length. This discussion was placed in the context of the patient's own specific clinical presentation and past medical history. All of her questions have been addressed.  Following the decision to proceed with transcatheter aortic valve replacement, a discussion was held regarding what types of management strategies would be attempted intraoperatively  in the event of life-threatening complications, including whether or not the patient would be considered a candidate for the use of cardiopulmonary bypass and/or conversion to open sternotomy for attempted surgical intervention. I do not  think she would be a candidate for emergent sternotomy to manage any intraoperative complications since she has already had bypass surgery. The patient is aware of the fact that transient use of cardiopulmonary bypass may be necessary. The patient has been advised of a variety of complications that might develop including but not limited to risks of death, stroke, paravalvular leak, aortic dissection or other major vascular complications, aortic annulus rupture, device embolization, cardiac rupture or perforation, mitral regurgitation, acute myocardial infarction, arrhythmia, heart block or bradycardia requiring permanent pacemaker placement, congestive heart failure, respiratory failure, renal failure, pneumonia, infection, other late complications related to structural valve deterioration or migration, or other complications that might ultimately cause a temporary or permanent loss of functional independence or other long term morbidity. The patient provides full informed consent for the procedure as described and all questions were answered.   Plan:   Transfemoral TAVR using a Medtronic valve.    Karina Pollack, MD

## 2022-02-09 NOTE — Anesthesia Preprocedure Evaluation (Addendum)
Anesthesia Evaluation  Patient identified by MRN, date of birth, ID band Patient awake    Reviewed: Allergy & Precautions, NPO status , Patient's Chart, lab work & pertinent test results, reviewed documented beta blocker date and time   History of Anesthesia Complications Negative for: history of anesthetic complications  Airway Mallampati: II  TM Distance: >3 FB Neck ROM: Full    Dental  (+) Dental Advisory Given   Pulmonary neg pulmonary ROS,  02/06/2022 SARS coronavirus NEG   breath sounds clear to auscultation       Cardiovascular hypertension, Pt. on medications and Pt. on home beta blockers (-) angina+ CAD and + CABG   Rhythm:Regular Rate:Normal  12/29/2021 Cath: LIMA graft patent, occlusion of SVG-OM, severe AS with grad 66 mmHg  11/2022 ECHO: EF 55-60%, normal LVF, mild LVH, Grade 1 DD, normal RVF, mild MR, functionally nbicuspid AV, severe AS, mean grad 28 mmHg, peak grad 43 mmHg   Neuro/Psych negative neurological ROS     GI/Hepatic Neg liver ROS, GERD  Medicated and Controlled,  Endo/Other  diabetes (glu 290), Oral Hypoglycemic AgentsMorbid obesity  Renal/GU Renal InsufficiencyRenal disease     Musculoskeletal  (+) Arthritis , steroids,    Abdominal (+) + obese,   Peds  Hematology   Anesthesia Other Findings   Reproductive/Obstetrics                            Anesthesia Physical Anesthesia Plan  ASA: 4  Anesthesia Plan: MAC   Post-op Pain Management: Tylenol PO (pre-op)*   Induction:   PONV Risk Score and Plan: 2 and Ondansetron and Treatment may vary due to age or medical condition  Airway Management Planned: Natural Airway and Simple Face Mask  Additional Equipment: Arterial line  Intra-op Plan:   Post-operative Plan:   Informed Consent: I have reviewed the patients History and Physical, chart, labs and discussed the procedure including the risks, benefits and  alternatives for the proposed anesthesia with the patient or authorized representative who has indicated his/her understanding and acceptance.     Dental advisory given  Plan Discussed with: CRNA and Surgeon  Anesthesia Plan Comments:        Anesthesia Quick Evaluation

## 2022-02-10 ENCOUNTER — Inpatient Hospital Stay (HOSPITAL_COMMUNITY): Payer: Medicare HMO | Admitting: Certified Registered Nurse Anesthetist

## 2022-02-10 ENCOUNTER — Other Ambulatory Visit: Payer: Self-pay

## 2022-02-10 ENCOUNTER — Inpatient Hospital Stay (HOSPITAL_COMMUNITY)
Admission: RE | Admit: 2022-02-10 | Discharge: 2022-02-11 | DRG: 267 | Disposition: A | Discharge status: Home / Self Care | Payer: Medicare HMO | Attending: Cardiovascular Disease | Admitting: Cardiovascular Disease

## 2022-02-10 ENCOUNTER — Other Ambulatory Visit (HOSPITAL_COMMUNITY): Payer: Medicare HMO

## 2022-02-10 ENCOUNTER — Inpatient Hospital Stay (HOSPITAL_COMMUNITY): Payer: Medicare HMO | Admitting: Physician Assistant

## 2022-02-10 ENCOUNTER — Encounter (HOSPITAL_COMMUNITY): Payer: Self-pay | Admitting: Cardiovascular Disease

## 2022-02-10 ENCOUNTER — Inpatient Hospital Stay (HOSPITAL_COMMUNITY)
Admission: RE | Admit: 2022-02-10 | Discharge: 2022-02-10 | Disposition: A | Discharge status: Home / Self Care | Payer: Medicare HMO | Source: Ambulatory Visit | Attending: Physician Assistant | Admitting: Physician Assistant

## 2022-02-10 ENCOUNTER — Encounter (HOSPITAL_COMMUNITY)
Admission: RE | Disposition: A | Discharge status: Home / Self Care | Payer: Medicare HMO | Source: Home / Self Care | Attending: Cardiovascular Disease

## 2022-02-10 ENCOUNTER — Other Ambulatory Visit: Payer: Self-pay | Admitting: Physician Assistant

## 2022-02-10 DIAGNOSIS — I350 Nonrheumatic aortic (valve) stenosis: Secondary | ICD-10-CM

## 2022-02-10 DIAGNOSIS — I5032 Chronic diastolic (congestive) heart failure: Secondary | ICD-10-CM | POA: Diagnosis present

## 2022-02-10 DIAGNOSIS — R911 Solitary pulmonary nodule: Secondary | ICD-10-CM | POA: Diagnosis present

## 2022-02-10 DIAGNOSIS — E1122 Type 2 diabetes mellitus with diabetic chronic kidney disease: Secondary | ICD-10-CM | POA: Diagnosis not present

## 2022-02-10 DIAGNOSIS — Z7984 Long term (current) use of oral hypoglycemic drugs: Secondary | ICD-10-CM | POA: Diagnosis not present

## 2022-02-10 DIAGNOSIS — Z96643 Presence of artificial hip joint, bilateral: Secondary | ICD-10-CM | POA: Diagnosis present

## 2022-02-10 DIAGNOSIS — Z7982 Long term (current) use of aspirin: Secondary | ICD-10-CM | POA: Diagnosis not present

## 2022-02-10 DIAGNOSIS — Z8249 Family history of ischemic heart disease and other diseases of the circulatory system: Secondary | ICD-10-CM

## 2022-02-10 DIAGNOSIS — Z952 Presence of prosthetic heart valve: Secondary | ICD-10-CM | POA: Diagnosis not present

## 2022-02-10 DIAGNOSIS — I10 Essential (primary) hypertension: Secondary | ICD-10-CM

## 2022-02-10 DIAGNOSIS — Q231 Congenital insufficiency of aortic valve: Secondary | ICD-10-CM | POA: Diagnosis not present

## 2022-02-10 DIAGNOSIS — K219 Gastro-esophageal reflux disease without esophagitis: Secondary | ICD-10-CM | POA: Diagnosis present

## 2022-02-10 DIAGNOSIS — I2510 Atherosclerotic heart disease of native coronary artery without angina pectoris: Secondary | ICD-10-CM | POA: Diagnosis present

## 2022-02-10 DIAGNOSIS — Z006 Encounter for examination for normal comparison and control in clinical research program: Secondary | ICD-10-CM

## 2022-02-10 DIAGNOSIS — E119 Type 2 diabetes mellitus without complications: Secondary | ICD-10-CM

## 2022-02-10 DIAGNOSIS — E876 Hypokalemia: Secondary | ICD-10-CM | POA: Diagnosis present

## 2022-02-10 DIAGNOSIS — Z90710 Acquired absence of both cervix and uterus: Secondary | ICD-10-CM | POA: Diagnosis not present

## 2022-02-10 DIAGNOSIS — Z91041 Radiographic dye allergy status: Secondary | ICD-10-CM

## 2022-02-10 DIAGNOSIS — Z6841 Body Mass Index (BMI) 40.0 and over, adult: Secondary | ICD-10-CM | POA: Diagnosis not present

## 2022-02-10 DIAGNOSIS — I25810 Atherosclerosis of coronary artery bypass graft(s) without angina pectoris: Secondary | ICD-10-CM | POA: Diagnosis present

## 2022-02-10 DIAGNOSIS — M109 Gout, unspecified: Secondary | ICD-10-CM | POA: Diagnosis present

## 2022-02-10 DIAGNOSIS — E7800 Pure hypercholesterolemia, unspecified: Secondary | ICD-10-CM | POA: Diagnosis present

## 2022-02-10 DIAGNOSIS — M1990 Unspecified osteoarthritis, unspecified site: Secondary | ICD-10-CM | POA: Diagnosis present

## 2022-02-10 DIAGNOSIS — Z79899 Other long term (current) drug therapy: Secondary | ICD-10-CM

## 2022-02-10 DIAGNOSIS — I130 Hypertensive heart and chronic kidney disease with heart failure and stage 1 through stage 4 chronic kidney disease, or unspecified chronic kidney disease: Secondary | ICD-10-CM | POA: Diagnosis not present

## 2022-02-10 DIAGNOSIS — N183 Chronic kidney disease, stage 3 unspecified: Secondary | ICD-10-CM | POA: Diagnosis not present

## 2022-02-10 HISTORY — DX: Nonrheumatic aortic (valve) stenosis: I35.0

## 2022-02-10 HISTORY — DX: Presence of prosthetic heart valve: Z95.2

## 2022-02-10 HISTORY — PX: TRANSCATHETER AORTIC VALVE REPLACEMENT, TRANSFEMORAL: SHX6400

## 2022-02-10 HISTORY — PX: INTRAOPERATIVE TRANSTHORACIC ECHOCARDIOGRAM: SHX6523

## 2022-02-10 LAB — POCT I-STAT, CHEM 8
BUN: 26 mg/dL — ABNORMAL HIGH (ref 8–23)
Calcium, Ion: 1.23 mmol/L (ref 1.15–1.40)
Chloride: 107 mmol/L (ref 98–111)
Creatinine, Ser: 0.8 mg/dL (ref 0.44–1.00)
Glucose, Bld: 267 mg/dL — ABNORMAL HIGH (ref 70–99)
HCT: 37 % (ref 36.0–46.0)
Hemoglobin: 12.6 g/dL (ref 12.0–15.0)
Potassium: 4.3 mmol/L (ref 3.5–5.1)
Sodium: 140 mmol/L (ref 135–145)
TCO2: 23 mmol/L (ref 22–32)

## 2022-02-10 LAB — GLUCOSE, CAPILLARY
Glucose-Capillary: 290 mg/dL — ABNORMAL HIGH (ref 70–99)
Glucose-Capillary: 236 mg/dL — ABNORMAL HIGH (ref 70–99)
Glucose-Capillary: 122 mg/dL — ABNORMAL HIGH (ref 70–99)

## 2022-02-10 SURGERY — IMPLANTATION, AORTIC VALVE, TRANSCATHETER, FEMORAL APPROACH
Anesthesia: Monitor Anesthesia Care | Site: Groin

## 2022-02-10 MED ORDER — ISOSORBIDE MONONITRATE ER 30 MG PO TB24
15.0000 mg | ORAL_TABLET | Freq: Every day | ORAL | Status: DC
  Administered 2022-02-10 – 2022-02-11 (×2): 15 mg via ORAL
  Filled 2022-02-10 (×2): qty 1

## 2022-02-10 MED ORDER — OXYCODONE HCL 5 MG PO TABS: 5.0000 mg | ORAL_TABLET | ORAL | Status: DC | PRN

## 2022-02-10 MED ORDER — INSULIN ASPART 100 UNIT/ML IJ SOLN
0.0000 [IU] | INTRAMUSCULAR | Status: AC | PRN
  Administered 2022-02-10 (×2): 8 [IU] via SUBCUTANEOUS
  Filled 2022-02-10: qty 1

## 2022-02-10 MED ORDER — FUROSEMIDE 20 MG PO TABS
20.0000 mg | ORAL_TABLET | Freq: Every day | ORAL | Status: DC
  Administered 2022-02-11: 20 mg via ORAL
  Filled 2022-02-10: qty 1

## 2022-02-10 MED ORDER — CHLORHEXIDINE GLUCONATE 4 % EX LIQD: 30.0000 mL | CUTANEOUS | Status: DC

## 2022-02-10 MED ORDER — CEFAZOLIN SODIUM-DEXTROSE 2-4 GM/100ML-% IV SOLN
2.0000 g | Freq: Three times a day (TID) | INTRAVENOUS | Status: AC
  Administered 2022-02-10 (×2): 2 g via INTRAVENOUS
  Filled 2022-02-10 (×2): qty 100

## 2022-02-10 MED ORDER — CHLORHEXIDINE GLUCONATE 0.12 % MT SOLN: 15.0000 mL | Freq: Once | OROMUCOSAL | Status: DC

## 2022-02-10 MED ORDER — FENTANYL CITRATE (PF) 100 MCG/2ML IJ SOLN: INTRAMUSCULAR | Status: DC | PRN

## 2022-02-10 MED ORDER — SODIUM CHLORIDE 0.9 % IV SOLN
INTRAVENOUS | Status: AC
Start: 2022-02-10 — End: 2022-02-10

## 2022-02-10 MED ORDER — HEPARIN 6000 UNIT IRRIGATION SOLUTION
Status: DC | PRN
  Administered 2022-02-10: 2

## 2022-02-10 MED ORDER — SODIUM CHLORIDE 0.9 % IV SOLN: 250.0000 mL | INTRAVENOUS | Status: DC | PRN

## 2022-02-10 MED ORDER — PROTAMINE SULFATE 10 MG/ML IV SOLN
INTRAVENOUS | Status: DC | PRN
  Administered 2022-02-10: 150 mg via INTRAVENOUS

## 2022-02-10 MED ORDER — SODIUM CHLORIDE 0.9% FLUSH
3.0000 mL | Freq: Two times a day (BID) | INTRAVENOUS | Status: DC
  Administered 2022-02-10 – 2022-02-11 (×2): 3 mL via INTRAVENOUS

## 2022-02-10 MED ORDER — CHLORHEXIDINE GLUCONATE 0.12 % MT SOLN
15.0000 mL | Freq: Once | OROMUCOSAL | Status: AC
  Administered 2022-02-10: 15 mL via OROMUCOSAL
  Filled 2022-02-10: qty 15

## 2022-02-10 MED ORDER — SODIUM CHLORIDE 0.9% FLUSH: 3.0000 mL | INTRAVENOUS | Status: DC | PRN

## 2022-02-10 MED ORDER — HEPARIN SODIUM (PORCINE) 1000 UNIT/ML IJ SOLN
INTRAMUSCULAR | Status: DC | PRN
  Administered 2022-02-10: 15000 [IU] via INTRAVENOUS

## 2022-02-10 MED ORDER — ONDANSETRON HCL 4 MG/2ML IJ SOLN: 4.0000 mg | Freq: Four times a day (QID) | INTRAMUSCULAR | Status: DC | PRN

## 2022-02-10 MED ORDER — DIPHENHYDRAMINE HCL 50 MG/ML IJ SOLN
INTRAMUSCULAR | Status: DC | PRN
  Administered 2022-02-10 (×2): 12.5 mg via INTRAVENOUS

## 2022-02-10 MED ORDER — ATORVASTATIN CALCIUM 80 MG PO TABS
80.0000 mg | ORAL_TABLET | Freq: Every day | ORAL | Status: DC
  Administered 2022-02-10 – 2022-02-11 (×2): 80 mg via ORAL
  Filled 2022-02-10 (×2): qty 1

## 2022-02-10 MED ORDER — NITROGLYCERIN IN D5W 200-5 MCG/ML-% IV SOLN: 0.0000 ug/min | INTRAVENOUS | Status: DC

## 2022-02-10 MED ORDER — 0.9 % SODIUM CHLORIDE (POUR BTL) OPTIME
TOPICAL | Status: DC | PRN
  Administered 2022-02-10: 3000 mL

## 2022-02-10 MED ORDER — DIPHENHYDRAMINE HCL 50 MG/ML IJ SOLN
INTRAMUSCULAR | Status: AC
  Filled 2022-02-10: qty 1

## 2022-02-10 MED ORDER — HEPARIN SODIUM (PORCINE) 1000 UNIT/ML IJ SOLN
INTRAMUSCULAR | Status: AC
  Filled 2022-02-10: qty 10

## 2022-02-10 MED ORDER — ONDANSETRON HCL 4 MG/2ML IJ SOLN
INTRAMUSCULAR | Status: DC | PRN
  Administered 2022-02-10: 4 mg via INTRAVENOUS

## 2022-02-10 MED ORDER — LIDOCAINE HCL 1 % IJ SOLN
INTRAMUSCULAR | Status: DC | PRN
Start: 2022-02-10 — End: 2022-02-10
  Administered 2022-02-10: 10 mL

## 2022-02-10 MED ORDER — IODIXANOL 320 MG/ML IV SOLN
INTRAVENOUS | Status: DC | PRN
  Administered 2022-02-10: 150 mL via INTRA_ARTERIAL

## 2022-02-10 MED ORDER — TRAMADOL HCL 50 MG PO TABS: 50.0000 mg | ORAL_TABLET | ORAL | Status: DC | PRN

## 2022-02-10 MED ORDER — FENTANYL CITRATE (PF) 250 MCG/5ML IJ SOLN
INTRAMUSCULAR | Status: AC
  Filled 2022-02-10: qty 5

## 2022-02-10 MED ORDER — PHENYLEPHRINE HCL-NACL 20-0.9 MG/250ML-% IV SOLN
INTRAVENOUS | Status: AC
  Filled 2022-02-10: qty 500

## 2022-02-10 MED ORDER — SODIUM CHLORIDE 0.9 % IV SOLN: INTRAVENOUS | Status: DC

## 2022-02-10 MED ORDER — ORAL CARE MOUTH RINSE: 15.0000 mL | Freq: Once | OROMUCOSAL | Status: AC

## 2022-02-10 MED ORDER — CHLORHEXIDINE GLUCONATE 4 % EX LIQD: 60.0000 mL | Freq: Once | CUTANEOUS | Status: DC

## 2022-02-10 MED ORDER — INSULIN ASPART 100 UNIT/ML IJ SOLN
0.0000 [IU] | Freq: Three times a day (TID) | INTRAMUSCULAR | Status: DC
  Administered 2022-02-10: 8 [IU] via SUBCUTANEOUS

## 2022-02-10 MED ORDER — ACETAMINOPHEN 500 MG PO TABS
1000.0000 mg | ORAL_TABLET | Freq: Once | ORAL | Status: AC
  Administered 2022-02-10: 1000 mg via ORAL
  Filled 2022-02-10: qty 2

## 2022-02-10 MED ORDER — LACTATED RINGERS IV SOLN: INTRAVENOUS | Status: DC

## 2022-02-10 MED ORDER — ASPIRIN EC 81 MG PO TBEC
81.0000 mg | DELAYED_RELEASE_TABLET | Freq: Two times a day (BID) | ORAL | Status: DC
Start: 2022-02-10 — End: 2022-02-11
  Administered 2022-02-10 – 2022-02-11 (×2): 81 mg via ORAL
  Filled 2022-02-10 (×2): qty 1

## 2022-02-10 MED ORDER — ACETAMINOPHEN 325 MG PO TABS
650.0000 mg | ORAL_TABLET | Freq: Four times a day (QID) | ORAL | Status: DC | PRN
Start: 2022-02-10 — End: 2022-02-11

## 2022-02-10 MED ORDER — PHENYLEPHRINE 40 MCG/ML (10ML) SYRINGE FOR IV PUSH (FOR BLOOD PRESSURE SUPPORT)
PREFILLED_SYRINGE | INTRAVENOUS | Status: DC | PRN
  Administered 2022-02-10: 40 ug via INTRAVENOUS

## 2022-02-10 MED ORDER — HEPARIN 6000 UNIT IRRIGATION SOLUTION
Status: AC
  Filled 2022-02-10: qty 1000

## 2022-02-10 MED ORDER — PROPOFOL 500 MG/50ML IV EMUL
INTRAVENOUS | Status: DC | PRN
  Administered 2022-02-10: 20 ug/kg/min via INTRAVENOUS

## 2022-02-10 MED ORDER — ONDANSETRON HCL 4 MG/2ML IJ SOLN
INTRAMUSCULAR | Status: AC
  Filled 2022-02-10: qty 2

## 2022-02-10 MED ORDER — FENTANYL CITRATE (PF) 250 MCG/5ML IJ SOLN
INTRAMUSCULAR | Status: DC | PRN
  Administered 2022-02-10 (×2): 25 ug via INTRAVENOUS
  Administered 2022-02-10: 50 ug via INTRAVENOUS
  Administered 2022-02-10: 25 ug via INTRAVENOUS

## 2022-02-10 MED ORDER — MIDAZOLAM HCL 2 MG/2ML IJ SOLN
INTRAMUSCULAR | Status: AC
  Filled 2022-02-10: qty 2

## 2022-02-10 MED ORDER — ACETAMINOPHEN 650 MG RE SUPP: 650.0000 mg | Freq: Four times a day (QID) | RECTAL | Status: DC | PRN

## 2022-02-10 MED ORDER — LOSARTAN POTASSIUM 25 MG PO TABS
12.5000 mg | ORAL_TABLET | Freq: Every day | ORAL | Status: DC
  Administered 2022-02-10 – 2022-02-11 (×2): 12.5 mg via ORAL
  Filled 2022-02-10 (×2): qty 1

## 2022-02-10 MED ORDER — MORPHINE SULFATE (PF) 2 MG/ML IV SOLN: 1.0000 mg | INTRAVENOUS | Status: DC | PRN

## 2022-02-10 MED ORDER — HEPARIN 6000 UNIT IRRIGATION SOLUTION
Status: AC
  Filled 2022-02-10: qty 500

## 2022-02-10 MED ORDER — LACTATED RINGERS IV SOLN
INTRAVENOUS | Status: DC | PRN
Start: 2022-02-10 — End: 2022-02-10

## 2022-02-10 MED ORDER — LIDOCAINE HCL 1 % IJ SOLN
INTRAMUSCULAR | Status: AC
  Filled 2022-02-10: qty 20

## 2022-02-10 MED ORDER — CLEVIDIPINE BUTYRATE 0.5 MG/ML IV EMUL
0.0000 mg/h | INTRAVENOUS | Status: DC
  Administered 2022-02-10: 2.5 mg/h via INTRAVENOUS
  Filled 2022-02-10: qty 50

## 2022-02-10 MED FILL — Heparin Sodium (Porcine) Inj 1000 Unit/ML: Qty: 1000 | Status: AC

## 2022-02-10 MED FILL — Magnesium Sulfate Inj 50%: INTRAMUSCULAR | Qty: 10 | Status: AC

## 2022-02-10 MED FILL — Potassium Chloride Inj 2 mEq/ML: INTRAVENOUS | Qty: 40 | Status: AC

## 2022-02-10 SURGICAL SUPPLY — 61 items
ADH SKN CLS APL DERMABOND .7 (GAUZE/BANDAGES/DRESSINGS) ×2
APL PRP STRL LF DISP 70% ISPRP (MISCELLANEOUS) ×2
BAG COUNTER SPONGE SURGICOUNT (BAG) ×3 IMPLANT
BAG DECANTER FOR FLEXI CONT (MISCELLANEOUS) IMPLANT
BAG SPNG CNTER NS LX DISP (BAG) ×2
BLADE CLIPPER SURG (BLADE) IMPLANT
BLADE OSCILLATING /SAGITTAL (BLADE) IMPLANT
BLADE STERNUM SYSTEM 6 (BLADE) IMPLANT
CABLE ADAPT CONN TEMP 6FT (ADAPTER) ×3 IMPLANT
CATH DIAG EXPO 6F AL1 (CATHETERS) IMPLANT
CATH DIAG EXPO 6F VENT PIG 145 (CATHETERS) ×6 IMPLANT
CATH INFINITI 6F AL2 (CATHETERS) IMPLANT
CATH S G BIP PACING (CATHETERS) ×3 IMPLANT
CHLORAPREP W/TINT 26 (MISCELLANEOUS) ×3 IMPLANT
CLOSURE MYNX CONTROL 6F/7F (Vascular Products) ×1 IMPLANT
CNTNR URN SCR LID CUP LEK RST (MISCELLANEOUS) ×4 IMPLANT
CONT SPEC 4OZ STRL OR WHT (MISCELLANEOUS) ×6
COVER BACK TABLE 80X110 HD (DRAPES) ×3 IMPLANT
DECANTER SPIKE VIAL GLASS SM (MISCELLANEOUS) ×3 IMPLANT
DERMABOND ADVANCED (GAUZE/BANDAGES/DRESSINGS) ×1
DERMABOND ADVANCED .7 DNX12 (GAUZE/BANDAGES/DRESSINGS) ×2 IMPLANT
DEVICE CLOSURE PERCLS PRGLD 6F (VASCULAR PRODUCTS) ×4 IMPLANT
DRSG TEGADERM 4X4.75 (GAUZE/BANDAGES/DRESSINGS) ×6 IMPLANT
ELECT REM PT RETURN 9FT ADLT (ELECTROSURGICAL) ×3
ELECTRODE REM PT RTRN 9FT ADLT (ELECTROSURGICAL) ×2 IMPLANT
GAUZE SPONGE 4X4 12PLY STRL (GAUZE/BANDAGES/DRESSINGS) ×3 IMPLANT
GAUZE SPONGE 4X4 12PLY STRL LF (GAUZE/BANDAGES/DRESSINGS) ×2 IMPLANT
GLOVE SURG ENC MOIS LTX SZ7.5 (GLOVE) ×3 IMPLANT
GLOVE SURG ENC MOIS LTX SZ8 (GLOVE) IMPLANT
GLOVE SURG ORTHO LTX SZ7.5 (GLOVE) IMPLANT
GOWN STRL REUS W/ TWL LRG LVL3 (GOWN DISPOSABLE) IMPLANT
GOWN STRL REUS W/ TWL XL LVL3 (GOWN DISPOSABLE) ×2 IMPLANT
GOWN STRL REUS W/TWL LRG LVL3 (GOWN DISPOSABLE)
GOWN STRL REUS W/TWL XL LVL3 (GOWN DISPOSABLE) ×3
GUIDEWIRE SAFE TJ AMPLATZ EXST (WIRE) ×3 IMPLANT
KIT BASIN OR (CUSTOM PROCEDURE TRAY) ×3 IMPLANT
KIT HEART LEFT (KITS) ×3 IMPLANT
KIT TURNOVER KIT B (KITS) ×3 IMPLANT
NS IRRIG 1000ML POUR BTL (IV SOLUTION) ×3 IMPLANT
PACK ENDO MINOR (CUSTOM PROCEDURE TRAY) ×3 IMPLANT
PAD ARMBOARD 7.5X6 YLW CONV (MISCELLANEOUS) ×6 IMPLANT
PAD ELECT DEFIB RADIOL ZOLL (MISCELLANEOUS) ×3 IMPLANT
PERCLOSE PROGLIDE 6F (VASCULAR PRODUCTS) ×6
POSITIONER HEAD DONUT 9IN (MISCELLANEOUS) ×3 IMPLANT
SET MICROPUNCTURE 5F STIFF (MISCELLANEOUS) ×3 IMPLANT
SHEATH BRITE TIP 7FR 35CM (SHEATH) ×3 IMPLANT
SHEATH PINNACLE 6F 10CM (SHEATH) ×3 IMPLANT
SHEATH PINNACLE 8F 10CM (SHEATH) ×3 IMPLANT
SLEEVE REPOSITIONING LENGTH 30 (MISCELLANEOUS) ×3 IMPLANT
STOPCOCK MORSE 400PSI 3WAY (MISCELLANEOUS) ×6 IMPLANT
SUT PROLENE 6 0 C 1 30 (SUTURE) IMPLANT
SUT SILK  1 MH (SUTURE) ×3
SUT SILK 1 MH (SUTURE) ×2 IMPLANT
SYR 50ML LL SCALE MARK (SYRINGE) ×3 IMPLANT
SYR BULB IRRIG 60ML STRL (SYRINGE) IMPLANT
TAPE CLOTH SURG 4X10 WHT LF (GAUZE/BANDAGES/DRESSINGS) ×1 IMPLANT
TOWEL GREEN STERILE (TOWEL DISPOSABLE) ×6 IMPLANT
TRANSDUCER W/STOPCOCK (MISCELLANEOUS) ×6 IMPLANT
VALVE EVOLUT FX 29 (Valve) ×1 IMPLANT
WIRE EMERALD 3MM-J .035X150CM (WIRE) ×3 IMPLANT
WIRE EMERALD 3MM-J .035X260CM (WIRE) ×3 IMPLANT

## 2022-02-10 NOTE — Anesthesia Postprocedure Evaluation (Signed)
Anesthesia Post Note  Patient: MERRYN THAKER  Procedure(s) Performed: TRANSCATHETER AORTIC VALVE REPLACEMENT, TRANSFEMORAL USING A 29MM MEDTRONIC EVOLUT FX TRANSCATHETER AORTIC VALVE. (Groin) INTRAOPERATIVE TRANSTHORACIC ECHOCARDIOGRAM (Chest)     Patient location during evaluation: Nursing Unit Anesthesia Type: MAC Level of consciousness: awake and alert, patient cooperative and oriented Pain management: pain level controlled Vital Signs Assessment: post-procedure vital signs reviewed and stable Respiratory status: spontaneous breathing, nonlabored ventilation and respiratory function stable Cardiovascular status: blood pressure returned to baseline and stable Postop Assessment: no apparent nausea or vomiting and adequate PO intake Anesthetic complications: no   No notable events documented.  Last Vitals:  Vitals:   02/10/22 1300 02/10/22 1400  BP: 136/78 (!) 151/68  Pulse: (!) 47 62  Resp: 15 20  Temp:    SpO2:  94%    Last Pain:  Vitals:   02/10/22 1155  TempSrc: Oral  PainSc: Asleep                 Amayrany Cafaro,E. Kaeden Mester

## 2022-02-10 NOTE — Progress Notes (Signed)
°     20 g, R radial arterial line was pulled, and manual pressure was held for 10 min. Sterile gauze was applied at the site.Good capillary refill, , 3 sec, and R radial pulse 2+. No hematoma was noted at the site.

## 2022-02-10 NOTE — Progress Notes (Signed)
Pt arrived to 4E s/p TAVR. Groin sites level 0 c/d/I. CHG bath done. Tele applied and CCMD notified. VSS. Pt oriented to room and call light in reach. Pt daughter in law notified pt in room.  Raelyn Number, RN

## 2022-02-10 NOTE — Plan of Care (Signed)

## 2022-02-10 NOTE — Interval H&P Note (Signed)
History and Physical Interval Note:  02/10/2022 6:54 AM  Karina Green  has presented today for surgery, with the diagnosis of AO.  The various methods of treatment have been discussed with the patient and family. After consideration of risks, benefits and other options for treatment, the patient has consented to  Procedure(s): Transcatheter Aortic Valve Replacement, Transfemoral (N/A) INTRAOPERATIVE TRANSTHORACIC ECHOCARDIOGRAM (N/A) as a surgical intervention.  The patient's history has been reviewed, patient examined, no change in status, stable for surgery.  I have reviewed the patient's chart and labs.  Questions were answered to the patient's satisfaction.     Gaye Pollack

## 2022-02-10 NOTE — Progress Notes (Signed)
°  Echocardiogram 2D Echocardiogram limited has been performed.  Darlina Sicilian M 02/10/2022, 9:12 AM

## 2022-02-10 NOTE — Op Note (Signed)
[facility]   MULTIDISCIPLINARY HEART VALVE TEAM     TAVR OPERATIVE NOTE    KEIGHLEY DECKMAN [id number]  Date of Procedure:                 02/10/2022   Preoperative Diagnosis:      Severe Aortic Stenosis    Postoperative Diagnosis:    Same    Procedure:        Transcatheter Aortic Valve Replacement - Percutaneous Right Transfemoral Approach             Medtronic Evolut FX  (size 29 mm, model # EVOLUTFX-29, serial # [id number])              Co-Surgeons:            Gaye Pollack, MD and Lauree Chandler, MD     Anesthesiologist:                  Annye Asa, MD   Echocardiographer:              Sanda Klein, MD   Pre-operative Echo Findings: Severe aortic stenosis   Normal left ventricular systolic function   Post-operative Echo Findings: No paravalvular leak Normal left ventricular systolic function      BRIEF CLINICAL NOTE AND INDICATIONS FOR SURGERY    The patient is a 66 year old woman with a history of hypertension, hyperlipidemia, type 2 diabetes, gout, coronary disease status post coronary bypass graft surgery x2 in 2015 by Dr. Prescott Gum, and aortic stenosis that has been followed by Dr. Harl Bowie. She has stage D, severe, symptomatic aortic stenosis with New York Heart Association class II symptoms of exertional fatigue and shortness of breath consistent with chronic diastolic congestive heart failure. I have personally reviewed her 2D echocardiogram, cardiac catheterization, and CTA studies. Her echocardiogram shows a bicuspid aortic valve with moderate calcification with a mean gradient of 28 mmHg and a peak gradient of 43 mmHg. Aortic valve area is 0.71 cm. Left ventricular systolic function is normal with grade 2 diastolic dysfunction. Stroke-volume index is low at 33. Cardiac catheterization shows 70% distal left main stenosis as well as 75% proximal LAD stenosis. There is about 50% proximal left circumflex stenosis. The previous vein graft to  the left circumflex is occluded but there is a patent left internal mammary graft to the LAD. The right coronary artery is a dominant vessel with no significant stenosis. The peak to peak gradient across the aortic valve is 66 mmHg with mildly elevated right heart pressures. I agree that aortic valve replacement is indicated in this patient for relief of her symptoms and to prevent left ventricular deterioration. I think that transcatheter aortic valve replacement would be the best treatment for her since she has had previous coronary bypass graft surgery and morbid obesity. Her gated cardiac CTA shows anatomy suitable for TAVR using a 23 mm Edwards SAPIEN 3 valve or a 29 mm Medtronic Evolut FX valve. I think a 29 mm Medtronic valve would be better for her with a BMI of 40.7. Her abdominal and pelvic CTA shows adequate pelvic vascular anatomy to allow transfemoral insertion.  The patient was counseled at length regarding treatment alternatives for management of severe symptomatic aortic stenosis. The risks and benefits of surgical intervention has been discussed in detail. Long-term prognosis with medical therapy was discussed. Alternative approaches such as conventional surgical aortic valve replacement, transcatheter aortic valve replacement, and palliative medical therapy were compared and contrasted  at length. This discussion was placed in the context of the patient's own specific clinical presentation and past medical history. All of her questions have been addressed.  Following the decision to proceed with transcatheter aortic valve replacement, a discussion was held regarding what types of management strategies would be attempted intraoperatively in the event of life-threatening complications, including whether or not the patient would be considered a candidate for the use of cardiopulmonary bypass and/or conversion to open sternotomy for attempted surgical intervention. I do not think she would be a  candidate for emergent sternotomy to manage any intraoperative complications since she has already had bypass surgery. The patient is aware of the fact that transient use of cardiopulmonary bypass may be necessary. The patient has been advised of a variety of complications that might develop including but not limited to risks of death, stroke, paravalvular leak, aortic dissection or other major vascular complications, aortic annulus rupture, device embolization, cardiac rupture or perforation, mitral regurgitation, acute myocardial infarction, arrhythmia, heart block or bradycardia requiring permanent pacemaker placement, congestive heart failure, respiratory failure, renal failure, pneumonia, infection, other late complications related to structural valve deterioration or migration, or other complications that might ultimately cause a temporary or permanent loss of functional independence or other long term morbidity. The patient provides full informed consent for the procedure as described and all questions were answered.        DETAILS OF THE OPERATIVE PROCEDURE   PREPARATION:     The patient is brought to the operating room on the above mentioned date and central monitoring was established by the anesthesia team including placement of a radial arterial line. The patient is placed in the supine position on the operating table.  Intravenous antibiotics are administered.  The patient is monitored by anesthesia under conscious sedation.   Baseline transthoracic echocardiogram was performed. The patient's abdomen and both groins are prepared and draped in a sterile manner. A time out procedure is performed.     PERIPHERAL ACCESS:     Using the modified Seldinger technique, femoral arterial and venous access was obtained with placement of 6 Fr sheaths on the left side.  A pigtail diagnostic catheter was passed through the left arterial sheath under fluoroscopic guidance into the aortic root.  A  temporary transvenous pacemaker catheter was passed through the left femoral venous sheath under fluoroscopic guidance into the right ventricle.  The pacemaker was tested to ensure stable lead placement and pacemaker capture.      TRANSFEMORAL ACCESS:    Percutaneous transfemoral access and sheath placement was performed using ultrasound guidance.  The right common femoral artery was cannulated using a micropuncture needle.  A pair of Abbott Perclose percutaneous closure devices were placed and a 6 French sheath replaced into the femoral artery.  The patient was heparinized systemically and ACT verified > 250 seconds.     An 8 Fr sheath was introduced into the right femoral artery.  An AL-1 catheter was used to direct a straight-tip exchange length wire across the native aortic valve into the left ventricle. This was exchanged out for a pigtail catheter and position was confirmed in the LV apex. Simultaneous LV and Ao pressures were recorded.  The pigtail catheter was exchanged for a Confida wire in the LV apex.     BALLOON AORTIC VALVULOPLASTY:    Not performed   TRANSCATHETER HEART VALVE DEPLOYMENT:    A Medtronic Evolut FX transcatheter heart valve (size 29 mm) was prepared and loaded into the delivery  catheter system per manufacturer's guidelines and the proper orientation of the valve is confirmed under fluoroscopy. The 5F shealth was removed and the delivery system and inline sheath were inserted into the right common femoral artery over the Confida wire and the inline sheath advanced into the abdominal aorta under fluoroscopic guidance. The delivery catheter was advanced around the aortic arch and the valve was carefully positioned across the aortic valve annulus. An aortic root injection was performed to confirm position in the cusp overlap projection and the valve deployed to 80% using the normal technique under fluoroscopic guidance. Intermittent pacing was used during valve deployment. The  patient was turned LAO and the valve position checked and delivery completed. The delivery system and guidewire were retracted into the descending aorta and the nosecone re-sheathed. Valve function is assessed using echocardiography. There is felt to be no paravalvular leak and no central aortic insufficiency.  The patient's hemodynamic recovery following valve deployment is good.        PROCEDURE COMPLETION:    The delivery system and in-line sheath were removed and femoral artery closure performed using the Perclose devices.  Protamine was administered once femoral arterial repair was complete. The temporary pacemaker, pigtail catheter and femoral sheaths were removed with manual pressure used for hemostasis and a Mynx closure device used for the left femoral artery.   The patient tolerated the procedure well and is transported to the cath lab recovery area in stable condition. There were no immediate intraoperative complications. All sponge instrument and needle counts are verified correct at completion of the operation.    No blood products were administered during the operation.   The patient received a total of 30 mL of intravenous contrast during the procedure.     Gaye Pollack, MD

## 2022-02-10 NOTE — Anesthesia Procedure Notes (Signed)
Procedure Name: MAC Date/Time: 02/10/2022 7:25 AM Performed by: Leonor Liv, CRNA Pre-anesthesia Checklist: Patient identified, Emergency Drugs available, Suction available, Patient being monitored and Timeout performed Patient Re-evaluated:Patient Re-evaluated prior to induction Oxygen Delivery Method: Simple face mask Airway Equipment and Method: Oral airway Placement Confirmation: positive ETCO2 Dental Injury: Teeth and Oropharynx as per pre-operative assessment

## 2022-02-10 NOTE — Anesthesia Procedure Notes (Signed)
Arterial Line Insertion Start/End2/21/2023 7:00 AM, 02/10/2022 7:15 AM Performed by: Leonor Liv, CRNA, CRNA  Patient location: Pre-op. Preanesthetic checklist: patient identified, IV checked, site marked, risks and benefits discussed, surgical consent, monitors and equipment checked, pre-op evaluation, timeout performed and anesthesia consent Lidocaine 1% used for infiltration Right, radial was placed Catheter size: 20 G Hand hygiene performed  and maximum sterile barriers used  Allen's test indicative of satisfactory collateral circulation Attempts: 2 Procedure performed without using ultrasound guided technique. Following insertion, Biopatch and dressing applied. Post procedure assessment: normal  Patient tolerated the procedure well with no immediate complications. Additional procedure comments: Placed by Lucita Ferrara, SRNA under supervision of CRNA .

## 2022-02-10 NOTE — Progress Notes (Signed)
°  [facility] VALVE TEAM  Patient doing well s/p TAVR. She is hemodynamically stable. Groin sites stable. ECG with sinus brady but no high grade block. Plan to DC arterial line and transfer to 4E.  Plan for early ambulation after bedrest completed and hopeful discharge over the next 24-48 hours. BP elevated but off cleviprex gtt now. Will add back home imdur and losartan.  Angelena Form PA-C  [facility]  Pager [phone]

## 2022-02-10 NOTE — Discharge Instructions (Signed)

## 2022-02-10 NOTE — CV Procedure (Signed)
[facility]  TAVR OPERATIVE NOTE   Date of Procedure:  02/10/2022  Preoperative Diagnosis: Severe Aortic Stenosis   Postoperative Diagnosis: Same   Procedure:   Transcatheter Aortic Valve Replacement - Transfemoral Approach  Medtronic Evolut Pro THV (size 29 mm, model # [id number], serial # [id number])   Co-Surgeons:  Lauree Chandler, MD and Gaye Pollack, MD   Anesthesiologist:  Glennon Mac  Echocardiographer:  Croitoru  Pre-operative Echo Findings: Severe aortic stenosis Normal left ventricular systolic function  Post-operative Echo Findings: No paravalvular leak Normal left ventricular systolic function  BRIEF CLINICAL NOTE AND INDICATIONS FOR SURGERY  66 yo female with history of CAD s/p CABG, DM, GERD, HTN, HLD and severe aortic stenosis. Echo with LVEF 55-60%. Severe paradoxical low flow low gradient AS. Cardiac cath with patent LIMA to LAD, chronically occluded SVG to OM. She has had progressive dyspnea on exertion, fatigue. She has been seen by Dr. Cyndia Bent and her CT scans favor transfemoral access for TAVR with Medtronic THV.  During the course of the patient's preoperative work up they have been evaluated comprehensively by a multidisciplinary team of specialists coordinated through the [facility] in the [facility] [facility].  They have been demonstrated to suffer from symptomatic severe aortic stenosis as noted above. The patient has been counseled extensively as to the relative risks and benefits of all options for the treatment of severe aortic stenosis including long term medical therapy, conventional surgery for aortic valve replacement, and transcatheter aortic valve replacement.  The patient has been independently evaluated by Dr. Cyndia Bent with CT surgery and they are felt to be at high risk for conventional surgical aortic valve replacement. The surgeon indicated the patient would be a poor candidate for  conventional surgery. Based upon review of all of the patient's preoperative diagnostic tests they are felt to be candidate for transcatheter aortic valve replacement using the transfemoral approach as an alternative to high risk conventional surgery.    Following the decision to proceed with transcatheter aortic valve replacement, a discussion has been held regarding what types of management strategies would be attempted intraoperatively in the event of life-threatening complications, including whether or not the patient would be considered a candidate for the use of cardiopulmonary bypass and/or conversion to open sternotomy for attempted surgical intervention.  The patient has been advised of a variety of complications that might develop peculiar to this approach including but not limited to risks of death, stroke, paravalvular leak, aortic dissection or other major vascular complications, aortic annulus rupture, device embolization, cardiac rupture or perforation, acute myocardial infarction, arrhythmia, heart block or bradycardia requiring permanent pacemaker placement, congestive heart failure, respiratory failure, renal failure, pneumonia, infection, other late complications related to structural valve deterioration or migration, or other complications that might ultimately cause a temporary or permanent loss of functional independence or other long term morbidity.  The patient provides full informed consent for the procedure as described and all questions were answered preoperatively.    DETAILS OF THE OPERATIVE PROCEDURE  PREPARATION:   The patient is brought to the operating room on the above mentioned date and central monitoring was established by the anesthesia team including placement of a radial arterial line. The patient is placed in the supine position on the operating table.  Intravenous antibiotics are administered. Conscious sedation is used.   Baseline transthoracic echocardiogram was  performed. The patient's chest, abdomen, both groins, and both lower extremities are prepared and draped in a sterile  manner. A time out procedure is performed.   PERIPHERAL ACCESS:   Using the modified Seldinger technique, femoral arterial and venous access were obtained with placement of a 6 Fr sheath in the artery and a 7 Fr sheath in the vein on the left side using u/s guidance.  A pigtail diagnostic catheter was passed through the femoral arterial sheath under fluoroscopic guidance into the aortic root.  A temporary transvenous pacemaker catheter was passed through the femoral venous sheath under fluoroscopic guidance into the right ventricle.  The pacemaker was tested to ensure stable lead placement and pacemaker capture. Aortic root angiography was performed in order to determine the optimal angiographic angle for valve deployment.  TRANSFEMORAL ACCESS:  A micropuncture kit was used to gain access to the right femoral artery using u/s guidance. Position confirmed with angiography. Pre-closure with double ProGlide closure devices. The patient was heparinized systemically and ACT verified > 250 seconds.    8 French sheath placed in the right femoral artery. An AL-1 catheter was used to direct a straight-tip exchange length wire across the native aortic valve into the left ventricle. This was exchanged out for a pigtail catheter and position was confirmed in the LV apex. Simultaneous LV and Ao pressures were recorded.  The pigtail catheter was then exchanged for an Confida wire in the LV apex.   TRANSCATHETER HEART VALVE DEPLOYMENT:  A Medtronic Evolut Pro THV size 29 mm was prepared and per manufacturer's guidelines, and the proper orientation of the valve is confirmed on the delivery system.  The valve was advanced into the right femoral artery. The valve was then advanced across the aortic arch. The valve was carefully positioned across the aortic valve annulus. Once final position of the valve  has been confirmed with angiographic assessment in the cusp overlap view and in the LAO view, the valve is deployed with controlled rapid pacing. The valve is taken to 80% deployment and appropriate depth is confirmed. The valve is then released from each paddle. There is no valvular leak and no central aortic insufficiency.  The patient's hemodynamic recovery following valve deployment is good.  Echo demostrated acceptable post-procedural gradients, stable mitral valve function, and no AI.   PROCEDURE COMPLETION:  The device was removed and closure devices were completed. Protamine was administered once femoral arterial repair was complete. The temporary pacemaker, pigtail catheters and femoral sheaths were removed with a Mynx closure device placed in the artery and manual pressure used for venous hemostasis.    The patient tolerated the procedure well and is transported to the surgical intensive care in stable condition. There were no immediate intraoperative complications. All sponge instrument and needle counts are verified correct at completion of the operation.   No blood products were administered during the operation.  The patient received a total of 30 mL of intravenous contrast during the procedure.  Lauree Chandler MD 02/10/2022 9:39 AM

## 2022-02-10 NOTE — Progress Notes (Signed)
Mobility Specialist Progress Note   02/10/22 1715  Mobility  Activity Ambulated with assistance in hallway  Level of Assistance Contact guard assist, steadying assist  Assistive Device None  Distance Ambulated (ft) 400 ft  Activity Response Tolerated well  $Mobility charge 1 Mobility   Pt's incision sites presented well pre and post mobility. Asymptomatic throughout ambulation w/ no complaints. Left in chair w/ lunch tray in front and call bell by side.  Holland Falling Mobility Specialist Phone Number [phone]

## 2022-02-10 NOTE — H&P (Signed)
Structural Heart Team Admission Note  CC: dyspnea,  Severe AS   History of Present Illness: 66 yo female with history of CAD s/p CABG, DM, GERD, HTN, HLD and severe aortic stenosis. Echo with LVEF 55-60%. Severe paradoxical low flow low gradient AS. Cardiac cath with patent LIMA to LAD, chronically occluded SVG to OM. She has had progressive dyspnea on exertion, fatigue. She has been seen by Dr. Cyndia Bent and her CT scans favor transfemoral access for TAVR with Medtr[employer] THV.   Primary Care Physician: [place], [facility] Primary Cardiologist: Branch Referring Cardiologist: Harl Bowie  Past Medical History:  Diagnosis Date   Arthritis    Back pain    CAD (coronary artery disease), native coronary artery-70% LM stenosis 10/25/2014   Cough last 30 yrs    nonproductive, no fevers   DM type 2 (diabetes mellitus, type 2) ([place]) 10/25/2014   GERD (gastroesophageal reflux disease)    Gout    Hyperlipidemia    Hypertension    Severe aortic stenosis     Past Surgical History:  Procedure Laterality Date   ABDOMINAL HYSTERECTOMY     complete   APPENDECTOMY     bladder tack     CARDIAC CATHETERIZATION  10/25/14   70% LM stensis, normal EF   CHOLECYSTECTOMY     CORONARY ARTERY BYPASS GRAFT N/A 10/29/2014   Procedure: CORONARY ARTERY BYPASS GRAFTING (CABG) TIMES TWO USING LEFT INTERNAL MAMMARY ARTERY AND RIGHT LEG GREATER SAPHENOUS VEIN HARVESTED ENDOSCOPICALLY.;  Surgeon: Ivin Poot, MD;  Location: [place];  Service: Open Heart Surgery;  Laterality: N/A;   HERNIA REPAIR     INTRAOPERATIVE TRANSESOPHAGEAL ECHOCARDIOGRAM N/A 10/29/2014   Procedure: INTRAOPERATIVE TRANSESOPHAGEAL ECHOCARDIOGRAM;  Surgeon: Ivin Poot, MD;  Location: [place];  Service: Open Heart Surgery;  Laterality: N/A;   JOINT REPLACEMENT Right 2009   hip total   LEFT HEART CATHETERIZATION WITH CORONARY ANGIOGRAM N/A 10/26/2014   Procedure: LEFT HEART CATHETERIZATION WITH CORONARY ANGIOGRAM;  Surgeon: Troy Sine, MD;  Location: [facility] CATH LAB;  Service: Cardiovascular;  Laterality: N/A;   RIGHT/LEFT HEART CATH AND CORONARY/GRAFT ANGIOGRAPHY N/A 12/29/2021   Procedure: RIGHT/LEFT HEART CATH AND CORONARY/GRAFT ANGIOGRAPHY;  Surgeon: Sherren Mocha, MD;  Location: [place] CV LAB;  Service: Cardiovascular;  Laterality: N/A;   TONSILLECTOMY     TOTAL HIP ARTHROPLASTY     TOTAL HIP ARTHROPLASTY Left 05/07/2016   Procedure: TOTAL HIP ARTHROPLASTY ANTERIOR APPROACH;  Surgeon: Rod Can, MD;  Location: [facility];  Service: Orthopedics;  Laterality: Left;    Current Facility-Administered Medications  Medication Dose Route Frequency Provider Last Rate Last Admin   [START ON 02/11/2022] 0.9 %  sodium chloride infusion   Intravenous Continuous Eileen Stanford, PA-C       ceFAZolin (ANCEF) IVPB 2g/100 mL premix  2 g Intravenous To OR Burnell Blanks, MD       chlorhexidine (HIBICLENS) 4 % liquid 2 application  30 mL Topical UD Eileen Stanford, PA-C       chlorhexidine (HIBICLENS) 4 % liquid 4 application  60 mL Topical Once Eileen Stanford, PA-C       And   [START ON 02/11/2022] chlorhexidine (HIBICLENS) 4 % liquid 4 application  60 mL Topical Once Eileen Stanford, PA-C       [START ON 02/11/2022] chlorhexidine (PERIDEX) 0.12 % solution 15 mL  15 mL Mouth/Throat Once Eileen Stanford, PA-C       dexmedetomidine (PRECEDEX) 400 MCG/100ML (4  mcg/mL) infusion  0.1-0.7 mcg/kg/hr Intravenous To OR Burnell Blanks, MD       heparin 30,000 units/NS 1000 mL solution for CELLSAVER   Other To OR Burnell Blanks, MD       heparin 6000 units / NS 500 mL irrigation    PRN Burnell Blanks, MD   3 application at 49/67/59 0719   insulin aspart (novoLOG) injection 0-14 Units  0-14 Units Subcutaneous Q2H PRN Stoltzfus, March Rummage, DO   8 Units at 02/10/22 1638   lactated ringers infusion   Intravenous Continuous Annye Asa, MD       magnesium sulfate (IV Push/IM) injection  40 mEq  40 mEq Other To OR Burnell Blanks, MD       norepinephrine (LEVOPHED) 4mg  in 217mL (0.016 mg/mL) premix infusion  0-10 mcg/min Intravenous To OR Burnell Blanks, MD       potassium chloride injection 80 mEq  80 mEq Other To OR Burnell Blanks, MD        Allergies  Allergen Reactions   Iodinated Contrast Media Rash    Very large itchy rash within 24 hours of heart cath     Social History   Socioeconomic History   Marital status: Divorced    Spouse name: Not on file   Number of children: Not on file   Years of education: Not on file   Highest education level: Not on file  Occupational History   Not on file  Tobacco Use   Smoking status: Never   Smokeless tobacco: Never  Vaping Use   Vaping Use: Never used  Substance and Sexual Activity   Alcohol use: No    Alcohol/week: 0.0 standard drinks   Drug use: No   Sexual activity: Not on file  Other Topics Concern   Not on file  Social History Narrative   Not on file   Social Determinants of Health   Financial Resource Strain: Not on file  Food Insecurity: Not on file  Transportation Needs: Not on file  Physical Activity: Not on file  Stress: Not on file  Social Connections: Not on file  Intimate Partner Violence: Not on file    Family History  Problem Relation Age of Onset   Hypertension Mother    Coronary artery disease Mother        CABG   Hypertension Father    CAD Father        CABG   Coronary artery disease Brother        Stent    Review of Systems:  As stated in the HPI and otherwise negative.   BP (!) 189/66    Pulse 64    Temp 98.3 F (36.8 C) (Oral)    Resp 17    Ht 5\' 3"  (1.6 m)    Wt 104.8 kg    SpO2 94%    BMI 40.92 kg/m   Physical Examination: General: Well developed, well nourished, NAD  HEENT: OP clear, mucus membranes moist  SKIN: warm, dry. No rashes. Neuro: No focal deficits  Musculoskeletal: Muscle strength 5/5 all ext  Psychiatric: Mood and affect  normal  Neck: No JVD, no carotid bruits, no thyromegaly, no lymphadenopathy.  Lungs:Clear bilaterally, no wheezes, rhonci, crackles Cardiovascular: Regular rate and rhythm. Loud, harsh, late peaking systolic murmur.  Abdomen:Soft. Bowel sounds present. Non-tender.  Extremities: No lower extremity edema. Pulses are 2 + in the bilateral DP/PT.   Procedure: 2D Echo, Cardiac Doppler, Color Doppler and  Strain Analysis   Indications:    I35.0 (ICD-10-CM) - Aortic valve stenosis, mild     History:        Patient has prior history of Echocardiogram examinations,  most                  recent 04/11/2020. CAD, Signs/Symptoms:Chest Pain; Risk                  Factors:Hypertension, Diabetes, Non-Smoker and  Dyslipidemia.     Sonographer:    Leavy Cella RDCS  Referring Phys: [id number] [place]     1. Left ventricular ejection fraction, by estimation, is 55 to 60%. The  left ventricle has normal function. The left ventricle has no regional  wall motion abnormalities. There is mild left ventricular hypertrophy.  Left ventricular diastolic parameters  are consistent with Grade II diastolic dysfunction (pseudonormalization).   2. Right ventricular systolic function is normal. The right ventricular  size is mildly enlarged. There is normal pulmonary artery systolic  pressure. The estimated right ventricular systolic pressure is 95.6 mmHg.   3. Left atrial size was mild to moderately dilated.   4. The mitral valve is abnormal. Mild mitral valve regurgitation.   5. The aortic valve is functionally bicuspid. There is moderate  calcification of the aortic valve. Aortic valve regurgitation is trivial.  Moderate to severe aortic valve stenosis. Aortic regurgitation PHT  measures 469 msec. Aortic valve mean gradient  measures 28.0 mmHg. Dimentionless index 0.25.   6. The inferior vena cava is normal in size with greater than 50%  respiratory variability, suggesting right atrial  pressure of 3 mmHg.   Comparison(s): Prior images reviewed side by side. Aortic stenosis has  progressed with mean gradient now 28 mmHg up from 21 mmHg.   FINDINGS   Left Ventricle: Left ventricular ejection fraction, by estimation, is 55  to 60%. The left ventricle has normal function. The left ventricle has no  regional wall motion abnormalities. The left ventricular internal cavity  size was normal in size. There is   mild left ventricular hypertrophy. Left ventricular diastolic parameters  are consistent with Grade II diastolic dysfunction (pseudonormalization).   Right Ventricle: The right ventricular size is mildly enlarged. No  increase in right ventricular wall thickness. Right ventricular systolic  function is normal. There is normal pulmonary artery systolic pressure.  The tricuspid regurgitant velocity is 2.63   m/s, and with an assumed right atrial pressure of 3 mmHg, the estimated  right ventricular systolic pressure is 21.3 mmHg.   Left Atrium: Left atrial size was mild to moderately dilated.   Right Atrium: Right atrial size was normal in size.   Pericardium: There is no evidence of pericardial effusion.   Mitral Valve: The mitral valve is abnormal. There is mild thickening of  the mitral valve leaflet(s). Mild to moderate mitral annular  calcification. Mild mitral valve regurgitation.   Tricuspid Valve: The tricuspid valve is grossly normal. Tricuspid valve  regurgitation is trivial.   Aortic Valve: The aortic valve is bicuspid. There is moderate  calcification of the aortic valve. There is mild aortic valve annular  calcification. Aortic valve regurgitation is trivial. Aortic regurgitation  PHT measures 469 msec. Moderate to severe  aortic stenosis is present. Aortic valve mean gradient measures 28.0 mmHg.  Aortic valve peak gradient measures 43.3 mmHg. Aortic valve area, by VTI  measures 0.71 cm.   Pulmonic Valve: The pulmonic valve was grossly normal.  Pulmonic  valve  regurgitation is trivial.   Aorta: The aortic root is normal in size and structure.   Venous: The inferior vena cava is normal in size with greater than 50%  respiratory variability, suggesting right atrial pressure of 3 mmHg.   IAS/Shunts: No atrial level shunt detected by color flow Doppler.      LEFT VENTRICLE  PLAX 2D  LVIDd:         5.12 cm     Diastology  LVIDs:         3.63 cm     LV e' medial:    5.33 cm/s  LV PW:         1.20 cm     LV E/e' medial:  22.9  LV IVS:        1.21 cm     LV e' lateral:   4.20 cm/s  LVOT diam:     1.90 cm     LV E/e' lateral: 29.0  LV SV:         68  LV SV Index:   33  LVOT Area:     2.84 cm     LV Volumes (MOD)  LV vol d, MOD A2C: 95.7 ml  LV vol d, MOD A4C: 88.0 ml  LV vol s, MOD A2C: 29.5 ml  LV vol s, MOD A4C: 30.2 ml  LV SV MOD A2C:     66.2 ml  LV SV MOD A4C:     88.0 ml  LV SV MOD BP:      61.5 ml   RIGHT VENTRICLE  RV Basal diam:  3.33 cm  RV Mid diam:    2.34 cm  RV S prime:     7.50 cm/s  TAPSE (M-mode): 1.3 cm   LEFT ATRIUM             Index        RIGHT ATRIUM           Index  LA diam:        4.80 cm 2.32 cm/m   RA Area:     10.90 cm  LA Vol (A2C):   68.0 ml 32.92 ml/m  RA Volume:   21.10 ml  10.22 ml/m  LA Vol (A4C):   85.1 ml 41.20 ml/m  LA Biplane Vol: 78.7 ml 38.10 ml/m   AORTIC VALVE  AV Area (Vmax):    0.76 cm  AV Area (Vmean):   0.71 cm  AV Area (VTI):     0.71 cm  AV Vmax:           329.00 cm/s  AV Vmean:          250.000 cm/s  AV VTI:            0.951 m  AV Peak Grad:      43.3 mmHg  AV Mean Grad:      28.0 mmHg  LVOT Vmax:         88.10 cm/s  LVOT Vmean:        62.600 cm/s  LVOT VTI:          0.239 m  LVOT/AV VTI ratio: 0.25  AI PHT:            469 msec     AORTA  Ao Asc diam: 3.60 cm   MITRAL VALVE                TRICUSPID VALVE  MV Area (PHT): 1.96 cm  TR Peak grad:   27.7 mmHg  MV Decel Time: 387 msec     TR Vmax:        263.00 cm/s  MR Peak grad: 76.7 mmHg  MR  Vmax:      438.00 cm/s   SHUNTS  MV E velocity: 122.00 cm/s  Systemic VTI:  0.24 m  MV A velocity: 82.70 cm/s   Systemic Diam: 1.90 cm  MV E/A ratio:  1.48   Rozann Lesches MD  Electronically signed by Rozann Lesches MD  Signature Date/Time: 12/08/2021/10:29:32 AM         Final       Physicians   Panel Physicians Referring Physician Case Authorizing Physician  Sherren Mocha, MD (Primary)        Procedures   RIGHT/LEFT HEART CATH AND CORONARY/GRAFT ANGIOGRAPHY    Conclusion       Prox RCA lesion is 40% stenosed.   Mid RCA lesion is 40% stenosed.   Dist LM lesion is 70% stenosed.   Ost Cx lesion is 50% stenosed.   Ost LAD to Prox LAD lesion is 75% stenosed.   Origin to Prox Graft lesion is 100% stenosed.   1.  Moderately severe 70% distal left main stenosis 2.  Severe 75% proximal LAD stenosis 3.  Moderate 50% proximal circumflex stenosis 4.  Patent RCA with mild plaquing, dominant vessel 5. Continued patency of the LIMA-LAD 6. Chronic occlusion of the SVG-OM 7.  Severe calcific aortic stenosis with peak to peak gradient of 66 mmHg. 8.  Mildly elevated right heart pressures, suspect secondary to left heart disease   Recommendations: Medical therapy for CAD, TAVR evaluation for treatment of severe aortic stenosis.   Procedural Details   Technical Details INDICATION: 66 year old morbidly obese woman with coronary artery disease who underwent two-vessel CABG in 2015 for severe distal left main stenosis.  She has developed progressive symptoms of severe aortic stenosis and presents for right and left heart catheterization as part of her evaluation to determine treatment options.  PROCEDURAL DETAILS: There was an indwelling IV in a right antecubital vein. Using normal sterile technique, the IV was changed out for a 5 Fr brachial sheath over a 0.018 inch wire.  Attempts were made to perform right heart catheterization through the arm but the Swan-Ganz catheter would not  advance.  A venogram was performed and there are collaterals throughout the right arm but no central access through the venous system.  Attention is then turned to the left radial where left radial access is obtained.  Intra-arterial verapamil was administered through the radial artery sheath.  I was able to advance the wire and JR4 catheter into the descending thoracic aorta but because of steep angulation at the subclavian aorta junction, the catheter would not advance into the ascending aorta.  Attention was then turned to the right groin.  Direct ultrasound guidance is used.  A micropuncture technique is used and 5 [place] sheaths were inserted in the right femoral vein and right femoral artery.  Ultrasound images were digitally captured and stored in the patient's chart.  Right heart catheterization is performed with a 5 French Swan-Ganz catheter.  Standard protocol was followed for recording of right heart pressures and sampling of oxygen saturations. Fick cardiac output was calculated. Standard Judkins catheters were used for selective coronary angiography and bypass graft angiography.  The aortic valve is crossed with an AL-1 catheter and straight tip wire. LV pressure is recorded and an aortic valve pullback is performed.  Mynx closure devices were used for right femoral arterial and venous hemostasis.  A TR band is used for left radial artery hemostasis.      Estimated blood loss <50 mL.   During this procedure medications were administered to achieve and maintain moderate conscious sedation while the patient's heart rate, blood pressure, and oxygen saturation were continuously monitored and I was present face-to-face 100% of this time.    Medications (Filter: Administrations occurring from 0723 to 0902 on 12/29/21)  important  Continuous medications are totaled by the amount administered until 12/29/21 0902.    fentaNYL (SUBLIMAZE) injection (mcg) Total dose:  50 mcg Date/Time  Rate/Dose/Volume Action    12/29/21 0749 25 mcg Given    0815 25 mcg Given      midazolam (VERSED) injection (mg) Total dose:  3 mg Date/Time Rate/Dose/Volume Action    12/29/21 0749 2 mg Given    0814 1 mg Given      lidocaine (PF) (XYLOCAINE) 1 % injection (mL) Total volume:  14 mL Date/Time Rate/Dose/Volume Action    12/29/21 0755 2 mL Given    0802 2 mL Given    0814 10 mL Given      Radial Cocktail/Verapamil only (mL) Total volume:  10 mL Date/Time Rate/Dose/Volume Action    12/29/21 0805 10 mL Given      iohexol (OMNIPAQUE) 350 MG/ML injection (mL) Total volume:  75 mL Date/Time Rate/Dose/Volume Action    12/29/21 0840 75 mL Given      Heparin (Porcine) in NaCl 1000-0.9 UT/500ML-% SOLN (mL) Total volume:  1,000 mL Date/Time Rate/Dose/Volume Action    12/29/21 0839 500 mL Given    0840 500 mL Given      0.9% sodium chloride infusion (mL/kg/hr) Total volume:  6.32 mL Dosing weight:  104.3 Date/Time Rate/Dose/Volume Action    12/29/21 0859 1 mL/kg/hr - 104.3 mL/hr Rate/Dose Change      Sedation Time   Sedation Time Physician-1: 57 minutes 53 seconds Contrast   Medication Name Total Dose  iohexol (OMNIPAQUE) 350 MG/ML injection 75 mL    Radiation/Fluoro   Fluoro time: 11.1 (min) DAP: 32176 (mGycm2) Cumulative Air Kerma: 366 (mGy) Complications      Complications documented before study signed (12/29/2021  2:94 AM)     No complications were associated with this study.  Documented by Chana Bode, RN - 12/29/2021  8:56 AM      Coronary Findings   Diagnostic Dominance: Right Left Main  Dist LM lesion is 70% stenosed. 70% distal left main stenosis    Left Anterior Descending  Ost LAD to Prox LAD lesion is 75% stenosed. The lesion is eccentric. The lesion is moderately calcified. Complex eccentric, calcific stenosis is present in the proximal LAD, progressive from the previous catheterization study predating her bypasssurgery.    Left Circumflex  There  is moderately severe distal left main stenosis extending into the proximal circumflex where there is 50% stenosis. The AV circumflex has mild luminal irregularities and supplies an obtuse marginal branch that divides into twin vessels and a small posterolateral branch.  Ost Cx lesion is 50% stenosed.    Right Coronary Artery  There is mild diffuse disease throughout the vessel.  Prox RCA lesion is 40% stenosed.  Mid RCA lesion is 40% stenosed.    LIMA Graft To Mid LAD    Graft To 1st Mrg  Origin to Prox Graft lesion is 100% stenosed.    Intervention    No interventions have been  documented.    Coronary Diagrams   Diagnostic Dominance: Right Intervention   Implants         Vascular Products   Closure Mynx Control 69f - [id number] - Implanted Inventory item: CLOSURE [facility] CONTROL 41F Model/Cat number: [id number]  Manufacturer: [employer] Lot number: [id number]  Device identifier: [id number] Device identifier type: GS1  GUDID Information   Request status Successful      Brand name: MYNX CONTROL Version/Model: [id number]  Company name: [place]. MRI safety info as of 12/29/21: MR Safe  Contains dry or latex rubber: No      GMDN P.T. name: Wound hydrogel dressing, non-antimicrobial        As of 12/29/2021   Status: Implanted          Syngo Images    Show images for CARDIAC CATHETERIZATION Images on Long Term Storage    Show images for [place], [place] Link to Procedure Log   Procedure Log    Hemo Data   Flowsheet Row Most Recent Value  Fick Cardiac Output 6.27 L/min  Fick Cardiac Output Index 3.06 (L/min)/BSA  RA A Wave 8 mmHg  RA V Wave 11 mmHg  RA Mean 8 mmHg  RV Systolic Pressure 49 mmHg  RV Diastolic Pressure 3 mmHg  RV EDP 8 mmHg  PA Systolic Pressure 46 mmHg  PA Diastolic Pressure 20 mmHg  PA Mean 32 mmHg  PW A Wave 17 mmHg  PW V Wave 27 mmHg  PW Mean 19 mmHg  AO Systolic Pressure 277 mmHg  AO Diastolic Pressure 52 mmHg  AO Mean 75 mmHg   LV Systolic Pressure 824 mmHg  LV Diastolic Pressure 5 mmHg  LV EDP 18 mmHg  AOp Systolic Pressure 235 mmHg  AOp Diastolic Pressure 0 mmHg  AOp Mean Pressure 63 mmHg  LVp Systolic Pressure 361 mmHg  LVp Diastolic Pressure 53 mmHg  LVp EDP Pressure 58 mmHg  QP/QS 1  TPVR Index 10.46 HRUI  TSVR Index 24.51 HRUI  PVR SVR Ratio 0.19  TPVR/TSVR Ratio 0.43      ADDENDUM REPORT: 01/13/2022 13:11   CLINICAL DATA:  Aortic stenosis   EXAM: Cardiac TAVR CT   TECHNIQUE: The patient was scanned on a Siemens Force 443 slice scanner. A 120 kV retrospective scan was triggered in the descending thoracic aorta at 111 HU's. Gantry rotation speed was 270 msecs and collimation was .9 mm. No beta blockade or nitro were given. The 3D data set was reconstructed in 5% intervals of the R-R cycle. Systolic and diastolic phases were analyzed on a dedicated work station using MPR, MIP and VRT modes. The patient received 80 cc of contrast.   FINDINGS: Aortic Valve: Tri leaflet valve calcium score 1705   Aorta: Normal arch vessels no aneurysm mild calcific atherosclerosis   Sinotubular Junction: 27.4 mm   Ascending Thoracic Aorta: 3.5 cm   Aortic Arch: 28 mm   Descending Thoracic Aorta: 24 mm   Sinus of Valsalva Measurements:   Non-coronary: 26.7  mm   Right - coronary: 28.4 mm   Left - coronary: 28.8 mm   Coronary Artery Height above Annulus:   Left Main: 11.5 mm   Right Coronary: 12.9 mm above annulus   Virtual Basal Annulus Measurements:   Maximum/Minimum Diameter: 24.6 mm x 19.9 mm   Perimeter: 72.7 mm   Area: 400 mm2   Coronary Arteries: Patient LIMA to LAD Occluded SVG to OM   Optimum Fluoroscopic Angle for Delivery:  LAO 9 Cranial 4 degrees   IMPRESSION: 1. Tri leaflet AV with calcium score 1705   2.  Annular area of 400 mm2 suitable for a 23 mm Sapien 3   3. Optimum angiographic angle for deployment LAO 9 Cranial 4 degrees   4.  Normal ascending thoracic aorta  3.5 cm   5.  Occluded SVG to OM and patent LIMA to LAD   6.  Membranous Septum length 6.3 mm   Jenkins Rouge     Electronically Signed   By: Jenkins Rouge M.D.   On: 01/13/2022 13:11    Addended by Josue Hector, MD on 01/13/2022  1:13 PM    Study Result   Narrative & Impression  EXAM: OVER-READ INTERPRETATION  CT CHEST   The following report is an over-read performed by radiologist Dr. Vinnie Langton of [facility], [place] on 01/13/2022. This over-read does not include interpretation of cardiac or coronary anatomy or pathology. The coronary calcium score/coronary CTA interpretation by the cardiologist is attached.   COMPARISON:  Chest CTA 06/17/2015.   FINDINGS: Extracardiac findings will be described separately under dictation for contemporaneously obtained CTA chest, abdomen and pelvis.   IMPRESSION: Please see separate dictation for contemporaneously obtained CTA chest, abdomen and pelvis dated 01/13/2022 for full description of relevant extracardiac findings.   Electronically Signed: By: Vinnie Langton M.D. On: 01/13/2022 11:11      Narrative & Impression  CLINICAL DATA:  66 year old female with history of severe aortic stenosis. Preprocedural study prior to potential transcatheter aortic valve replacement (TAVR) procedure.   EXAM: CT ANGIOGRAPHY CHEST, ABDOMEN AND PELVIS   TECHNIQUE: Multidetector CT imaging through the chest, abdomen and pelvis was performed using the standard protocol during bolus administration of intravenous contrast. Multiplanar reconstructed images and MIPs were obtained and reviewed to evaluate the vascular anatomy.   RADIATION DOSE REDUCTION: This exam was performed according to the departmental dose-optimization program which includes automated exposure control, adjustment of the mA and/or kV according to patient size and/or use of iterative reconstruction technique.   CONTRAST:  135mL OMNIPAQUE IOHEXOL 350 MG/ML  SOLN   COMPARISON:  Chest CTA 06/17/2015.   FINDINGS: CTA CHEST FINDINGS   Cardiovascular: Heart size is normal. There is no significant pericardial fluid, thickening or pericardial calcification. There is aortic atherosclerosis, as well as atherosclerosis of the great vessels of the mediastinum and the coronary arteries, including calcified atherosclerotic plaque in the left main, left anterior descending, left circumflex and right coronary arteries. Status post median sternotomy for CABG including LIMA to the LAD. Severe thickening and calcification of the aortic valve.   Mediastinum/Lymph Nodes: No pathologically enlarged mediastinal or hilar lymph nodes. Esophagus is unremarkable in appearance. No axillary lymphadenopathy.   Lungs/Pleura: 9 mm left upper lobe pulmonary nodule (axial image 33 of series 5), similar in retrospect compared to prior chest CT from 2016 which point this was obscured by overlying airspace disease. No other suspicious appearing pulmonary nodules or masses are noted. No acute consolidative airspace disease. No pleural effusions.   Musculoskeletal/Soft Tissues: Median sternotomy wires. There are no aggressive appearing lytic or blastic lesions noted in the visualized portions of the skeleton.   CTA ABDOMEN AND PELVIS FINDINGS   Hepatobiliary: Diffuse low attenuation throughout the hepatic parenchyma, indicative of hepatic steatosis. No suspicious cystic or solid hepatic lesions. No intra or extrahepatic biliary ductal dilatation. Gallbladder is not visualized, presumably surgically absent.   Pancreas: No pancreatic mass. No pancreatic ductal dilatation. No pancreatic or peripancreatic  fluid collections or inflammatory changes.   Spleen: Unremarkable.   Adrenals/Urinary Tract: Mild multifocal cortical thinning in the kidneys bilaterally, presumably chronic post infectious or inflammatory scarring. No suspicious renal lesions.  No hydroureteronephrosis. Urinary bladder is largely obscured by beam hardening artifact from the patient's bilateral hip arthroplasties.   Stomach/Bowel: The appearance of the stomach is normal. There is no pathologic dilatation of small bowel or colon. The appendix is not confidently identified and may be surgically absent. Regardless, there are no inflammatory changes noted adjacent to the cecum to suggest the presence of an acute appendicitis at this time.   Vascular/Lymphatic: Aortic atherosclerosis, with vascular findings and measurements pertinent to potential T AVR procedure, as detailed below. No aneurysm or dissection noted in the abdominal or pelvic vasculature. No lymphadenopathy noted in the abdomen or pelvis.   Reproductive: Status post hysterectomy. Right ovary is unremarkable in appearance. In the left adnexal region (axial image 205 of series 4 and coronal image 85 of series 6) there is a 3.6 x 2.8 x 2.9 cm intermediate attenuation lesion which is incompletely characterized.   Other: No significant volume of ascites.  No pneumoperitoneum.   Musculoskeletal: Status post bilateral total hip arthroplasties. There are no aggressive appearing lytic or blastic lesions noted in the visualized portions of the skeleton.   VASCULAR MEASUREMENTS PERTINENT TO TAVR:   AORTA:   Minimal Aortic Diameter-9 x 8 mm   Severity of Aortic Calcification-severe   RIGHT PELVIS:   Right Common Iliac Artery -   Minimal Diameter-7.5 x 4.7 mm   Tortuosity-mild   Calcification-severe   Right External Iliac Artery -   Minimal Diameter-6.7 x 6.8 mm   Tortuosity-mild   Calcification-none   Right Common Femoral Artery -   Nondiagnostic quality secondary to extensive beam hardening artifact from bilateral hip arthroplasties.   LEFT PELVIS:   Left Common Iliac Artery -   Minimal Diameter-6.6 x 6.2 mm   Tortuosity-mild   Calcification-moderate   Left External Iliac  Artery -   Minimal Diameter-6.9 x 6.9 mm   Tortuosity-mild   Calcification-none   Left Common Femoral Artery -   Nondiagnostic quality on multiplanar reconstructions secondary to extensive beam hardening artifact from bilateral hip arthroplasties. On axial source images, the vessel appears patent with a mean diameter of approximately 6 mm.   Review of the MIP images confirms the above findings.   IMPRESSION: 1. Vascular findings and measurements pertinent to potential TAVR procedure, as detailed above. There is a very limited evaluation of the common femoral arteries bilaterally secondary to beam hardening artifact from the patient's bilateral hip arthroplasties. 2. Severe thickening and calcification of the aortic valve, compatible with reported clinical history of severe aortic stenosis. 3. 9 mm left upper lobe pulmonary nodule. This appears similar in retrospect to remote prior study from 2016 at which point this was largely obscured by overlying airspace consolidation. This is strongly favored to be benign, however, one additional noncontrast chest CT is recommended in 12 months to ensure the stability of this finding. 4. Left adnexal lesion measuring 3.6 x 2.8 x 2.9 cm which is intermediate attenuation. This is abnormal in a postmenopausal patient, and further evaluation with nonemergent pelvic ultrasound is recommended in the near future to better evaluate this finding. 5. Aortic atherosclerosis, in addition to left main and three-vessel coronary artery disease. Status post median sternotomy for CABG including LIMA to the LAD. 6. Hepatic steatosis. 7. Additional incidental findings, as above.      Recent  Labs: 02/06/2022: ALT 34; B Natriuretic Peptide 77.4; BUN 18; Creatinine, Ser 0.99; Hemoglobin 13.1; Platelets 208; Potassium 3.7; Sodium 142    Wt Readings from Last 3 Encounters:  02/10/22 104.8 kg  02/06/22 105 kg  02/04/22 104.3 kg      Assessment and  Plan:   1. Severe Aortic Valve Stenosis: She has severe, stage D aortic valve stenosis. I have personally reviewed the echo images. The aortic valve is thickened, calcified with limited leaflet mobility. I think she would benefit from AVR. Given her obesity and prior CABG, we will plan to proceed with TAVR instead of repeat open heart surgery.   Signed, Lauree Chandler, MD 02/10/2022 7:46 AM    [facility] [address], [place], [place]  [address] Phone: [phone]; Fax: [phone]

## 2022-02-10 NOTE — Transfer of Care (Signed)
Immediate Anesthesia Transfer of Care Note  Patient: Karina Green  Procedure(s) Performed: TRANSCATHETER AORTIC VALVE REPLACEMENT, TRANSFEMORAL USING A 29MM [employer] EVOLUT FX TRANSCATHETER AORTIC VALVE. (Groin) INTRAOPERATIVE TRANSTHORACIC ECHOCARDIOGRAM (Chest)  Patient Location: Cath Lab  Anesthesia Type:MAC  Level of Consciousness: awake, alert , oriented and drowsy  Airway & Oxygen Therapy: Patient Spontanous Breathing and Patient connected to nasal cannula oxygen  Post-op Assessment: Report given to RN, Post -op Vital signs reviewed and stable and Patient moving all extremities  Post vital signs: Reviewed and stable  Last Vitals:  Vitals Value Taken Time  BP 117/45 02/10/22 0957  Temp 36.3 C 02/10/22 0956  Pulse 47 02/10/22 1000  Resp 17 02/10/22 1000  SpO2 99 % 02/10/22 1000  Vitals shown include unvalidated device data.  Last Pain:  Vitals:   02/10/22 0956  TempSrc:   PainSc: 0-No pain         Complications: No notable events documented.

## 2022-02-11 ENCOUNTER — Inpatient Hospital Stay (HOSPITAL_COMMUNITY): Payer: Medicare HMO

## 2022-02-11 ENCOUNTER — Other Ambulatory Visit (HOSPITAL_COMMUNITY): Payer: Self-pay

## 2022-02-11 ENCOUNTER — Encounter (HOSPITAL_COMMUNITY): Payer: Self-pay | Admitting: Cardiovascular Disease

## 2022-02-11 DIAGNOSIS — Z952 Presence of prosthetic heart valve: Secondary | ICD-10-CM | POA: Diagnosis not present

## 2022-02-11 DIAGNOSIS — I350 Nonrheumatic aortic (valve) stenosis: Secondary | ICD-10-CM | POA: Diagnosis not present

## 2022-02-11 LAB — BASIC METABOLIC PANEL
Anion gap: 11 (ref 5–15)
BUN: 20 mg/dL (ref 8–23)
CO2: 21 mmol/L — ABNORMAL LOW (ref 22–32)
Calcium: 8.4 mg/dL — ABNORMAL LOW (ref 8.9–10.3)
Chloride: 108 mmol/L (ref 98–111)
Creatinine, Ser: 1.02 mg/dL — ABNORMAL HIGH (ref 0.44–1.00)
GFR, Estimated: >60 mL/min (ref >60)
Glucose, Bld: 101 mg/dL — ABNORMAL HIGH (ref 70–99)
Potassium: 3.5 mmol/L (ref 3.5–5.1)
Sodium: 140 mmol/L (ref 135–145)

## 2022-02-11 LAB — LIPID PANEL
Cholesterol: 134 mg/dL (ref 0–200)
HDL: 28 mg/dL — ABNORMAL LOW (ref >40)
LDL Cholesterol: 84 mg/dL (ref 0–99)
Total CHOL/HDL Ratio: 4.8 RATIO
Triglycerides: 109 mg/dL (ref <150)
VLDL: 22 mg/dL (ref 0–40)

## 2022-02-11 LAB — CBC
HCT: 36 % (ref 36.0–46.0)
Hemoglobin: 11.9 g/dL — ABNORMAL LOW (ref 12.0–15.0)
MCH: 28.7 pg (ref 26.0–34.0)
MCHC: 33.1 g/dL (ref 30.0–36.0)
MCV: 86.7 fL (ref 80.0–100.0)
Platelets: 182 10*3/uL (ref 150–400)
RBC: 4.15 MIL/uL (ref 3.87–5.11)
RDW: 14.6 % (ref 11.5–15.5)
WBC: 15.4 10*3/uL — ABNORMAL HIGH (ref 4.0–10.5)
nRBC: 0 % (ref 0.0–0.2)

## 2022-02-11 LAB — MAGNESIUM: Magnesium: 1.5 mg/dL — ABNORMAL LOW (ref 1.7–2.4)

## 2022-02-11 LAB — GLUCOSE, CAPILLARY
Glucose-Capillary: 98 mg/dL (ref 70–99)
Glucose-Capillary: 165 mg/dL — ABNORMAL HIGH (ref 70–99)

## 2022-02-11 MED ORDER — POTASSIUM CHLORIDE CRYS ER 20 MEQ PO TBCR
40.0000 meq | EXTENDED_RELEASE_TABLET | Freq: Once | ORAL | Status: AC
  Administered 2022-02-11: 40 meq via ORAL
  Filled 2022-02-11: qty 2

## 2022-02-11 MED ORDER — MAGNESIUM SULFATE 4 GM/100ML IV SOLN
4.0000 g | Freq: Once | INTRAVENOUS | Status: AC
  Administered 2022-02-11: 4 g via INTRAVENOUS
  Filled 2022-02-11: qty 100

## 2022-02-11 NOTE — TOC Transition Note (Signed)
Transition of Care (TOC) - CM/SW Discharge Note Marvetta Gibbons RN, BSN Transitions of Care Unit 4E- RN Case Manager See Treatment Team for direct phone #    Patient Details  Name: Karina Green MRN: [id number] Date of Birth: 1955/12/27  Transition of Care [facility]) CM/SW Contact:  Dawayne Patricia, RN Phone Number: 02/11/2022, 10:36 AM   Clinical Narrative:    Pt s/p TAVR, Transition of Care Department [facility]) has reviewed patient and no TOC needs have been identified at this time. Pt stable for transition home today.     Final next level of care: Home/Self Care Barriers to Discharge: No Barriers Identified   Patient Goals and CMS Choice     Choice offered to / list presented to : NA  Discharge Placement               Home        Discharge Plan and Services                DME Arranged: N/A DME Agency: NA       HH Arranged: NA HH Agency: NA        Social Determinants of Health (SDOH) Interventions     Readmission Risk Interventions Readmission Risk Prevention Plan 02/11/2022  Post Dischage Appt Complete  Medication Screening Complete  Transportation Screening Complete  Some recent data might be hidden

## 2022-02-11 NOTE — Progress Notes (Signed)
Pt just ambulated. Feeling well, sts she is not coughing. Discussed restrictions, walking for exercise, diet, and CRPII. Pt receptive. Will refer to [place].  [id number] [place], ACSM 11:24 AM 02/11/2022

## 2022-02-11 NOTE — Progress Notes (Signed)
A part of our preop labs

## 2022-02-11 NOTE — Progress Notes (Signed)
Mobility Specialist Progress Note   02/11/22 1101  Mobility  Activity Ambulated independently in hallway  Level of Assistance Modified independent, requires aide device or extra time  Assistive Device  (IV Pole)  Distance Ambulated (ft) 400 ft  Activity Response Tolerated well  $Mobility charge 1 Mobility   Received pt in chair having no complaints and agreeable to mobility. Asymptomatic throughout ambulation, returned back to chair w/ call bell in reach ready for d/c.  Holland Falling Mobility Specialist Phone Number [phone]

## 2022-02-11 NOTE — Plan of Care (Signed)

## 2022-02-11 NOTE — Progress Notes (Signed)
Echocardiogram 2D Echocardiogram has been performed.  One[place] Deputy Lesta Limbert RDCS 02/11/2022, 8:24 AM

## 2022-02-11 NOTE — Progress Notes (Signed)
Nursing DC note  Patient alert and oriented. Verbalized understanding of dc instructions. Ccmd notified of dc order. Piv dcd. Site unremarkable. All belongings and dc papers given. Volunteer staff to take patient towards main entrance. Secretary made aware.

## 2022-02-11 NOTE — Discharge Summary (Addendum)
[facility] TE[facility]  Discharge Summary    Patient ID: KYMBER KOSAR MRN: [id number]; DOB: [birth year]  Admit date: 02/10/2022 Discharge date: 02/11/2022  Primary Care Provider: [facility]  Primary Cardiologist: Carlyle Dolly, MD / Dr. Angelena Form & Dr. Cyndia Bent (TAVR)  Discharge Diagnoses    Principal Problem:   S/P TAVR (transcatheter aortic valve replacement) Active Problems:   Essential hypertension   Hypercholesteremia   CAD (coronary artery disease), native coronary artery-70% LM stenosis   DM2 (diabetes mellitus, type 2) (HCC)   Chronic diastolic heart failure (HCC)   Severe aortic stenosis   CKD (chronic kidney disease), stage III (HCC)   Allergies Allergies  Allergen Reactions   Iodinated Contrast Media Rash    Very large itchy rash within 24 hours of heart cath     Diagnostic Studies/Procedures      TAVR OPERATIVE NOTE    NYSIA DELL [id number]   Date of Procedure:                 02/10/2022   Preoperative Diagnosis:      Severe Aortic Stenosis    Postoperative Diagnosis:    Same    Procedure:        Transcatheter Aortic Valve Replacement - Percutaneous Right Transfemoral Approach             Medtronic Evolut FX  (size 29 mm, model # EVOLUTFX-29, serial # [id number])              Co-Surgeons:            Gaye Pollack, MD and Lauree Chandler, MD     Anesthesiologist:                  Annye Asa, MD   Echocardiographer:              Sanda Klein, MD   Pre-operative Echo Findings: Severe aortic stenosis   Normal left ventricular systolic function   Post-operative Echo Findings: No paravalvular leak Normal left ventricular systolic function _____________    Echo 02/11/22: completed but pending formal read at the time of discharge   History of Present Illness     RAYMIE TRANI is a 66 y.o. female with a history of CAD s/p CABG 10/2014 (LIMA-LAD/SVG-OM), morbid obesity  (BMI 41), HTN, HLD, DM2, and paradoxical LFLG aortic stenosis who presented to [facility] on 02/10/22 for planned TAVR.   Patient has developed worsening dyspnea on exertion. Her most recent echo on 12/08/2021 showed an EF of 55-60%, a bicuspid aortic valve with moderate calcification and thickening with a mean gradient of 28 mmHg, peak gradient of 43 mmHg, AVA 0.71 cm and mild aortic insufficiency. She was referred to structural heart for evaluation. [facility] 12/29/21 showed moderately severe 70% distal left main stenosis, severe 75% proximal LAD stenosis, moderate 50% proximal circumflex stenosis, patent RCA with mild plaquing, dominant vessel. Continued patency of the LIMA-LAD, chronic occlusion of the SVG-OM and severe calcific aortic stenosis with peak to peak gradient of 66 mmHg.  The patient has been evaluated by the multidisciplinary valve team and felt to have severe, symptomatic aortic stenosis and to be a suitable candidate for TAVR, which was set up for 02/11/22.   Hospital Course     Consultants: none   Severe AS: s/p successful TAVR with a 29 mm Medtronic Evolut FX THV via the TF approach on 02/10/22. Post operative echo completed but pending formal  read. I personally reviewed and EF normal with normally functioning TAVR with a mean gradient of 10 mmHg and no PVL. Groin sites are stable. ECG with sinus and no high grade heart block. Tele also showed sinus with no bradyarrythmias. Continue Asprin alone.   DMT2: treated with SSI while admitted. Resume home meds at discharge. Cardiac pharmacist discussed adding Jardiance or Farixga for better blood sugar control and CV protection and renal protection. She would like to discuss in the outpatient setting.   HTN: BP well controlled. Resume home meds.  CAD s/p CABG: pre TAVR cath showed moderately severe 70% distal left main stenosis, severe 75% proximal LAD stenosis, moderate 50% proximal circumflex stenosis, patent RCA with mild plaquing, dominant vessel.  Continued patency of the LIMA-LAD, chronic occlusion of the SVG-OM. Continue medical therapy with aspirin and high dose statin.   Morbid obesity: Body mass index is 41.04 kg/m. Continue diet and exercise.   Leukocytosis: related to steroid given for contrast dye allergy.   Hypokalemia/hypomagnesia: these were repleted.   Pulmonary nodule: pre TAVR scans showed a 9 mm left upper lobe pulmonary nodule. This appears similar in retrospect to remote prior study from 2016 at which point this was largely obscured by overlying airspace consolidation. This is strongly favored to be benign, however, one additional noncontrast chest CT is recommended in 12 months to ensure the stability of this finding. This will be dicussed in the outpatient setting.   Left adnexal lesion: pre TAVR scans showed a 3.6 x 2.8 x 2.9 cm left adnexal lesion which is intermediate attenuation. This is abnormal in a postmenopausal patient, and further evaluation with nonemergent pelvic ultrasound is recommended in the near future to better evaluate this finding. This will be dicussed in the outpatient setting.   _____________  Discharge Vitals Blood pressure (!) 128/59, pulse 75, temperature 98.1 F (36.7 C), temperature source Oral, resp. rate 14, height 5\' 3"  (1.6 m), weight 105.1 kg, SpO2 98 %.  Filed Weights   02/10/22 0547 02/11/22 0500  Weight: 104.8 kg 105.1 kg    GEN: Well nourished, well developed, in no acute distress, obese HEENT: normal Neck: no JVD or masses Cardiac: RRR; no murmurs, rubs, or gallops,no edema  Respiratory:  clear to auscultation bilaterally, normal work of breathing GI: soft, nontender, nondistended, + BS MS: no deformity or atrophy Skin: warm and dry, no rash. Groin sites clear without hematoma or ecchymosis  Neuro:  Alert and Oriented x 3, Strength and sensation are intact Psych: euthymic mood, full affect   Labs & Radiologic Studies    CBC Recent Labs    02/10/22 1004  02/11/22 0216  WBC  --  15.4*  HGB 12.6 11.9*  HCT 37.0 36.0  MCV  --  86.7  PLT  --  409   Basic Metabolic Panel Recent Labs    02/10/22 1004 02/11/22 0216  NA 140 140  K 4.3 3.5  CL 107 108  CO2  --  21*  GLUCOSE 267* 101*  BUN 26* 20  CREATININE 0.80 1.02*  CALCIUM  --  8.4*  MG  --  1.5*   Liver Function Tests No results for input(s): AST, ALT, ALKPHOS, BILITOT, PROT, ALBUMIN in the last 72 hours. No results for input(s): LIPASE, AMYLASE in the last 72 hours. Cardiac Enzymes No results for input(s): CKTOTAL, CKMB, CKMBINDEX, TROPONINI in the last 72 hours. BNP Invalid input(s): POCBNP D-Dimer No results for input(s): DDIMER in the last 72 hours. Hemoglobin A1C No results  for input(s): HGBA1C in the last 72 hours. Fasting Lipid Panel No results for input(s): CHOL, HDL, LDLCALC, TRIG, CHOLHDL, LDLDIRECT in the last 72 hours. Thyroid Function Tests No results for input(s): TSH, T4TOTAL, T3FREE, THYROIDAB in the last 72 hours.  Invalid input(s): FREET3 _____________  DG Chest 2 View  Result Date: 02/07/2022 CLINICAL DATA:  Pre-admission for TAVR. EXAM: CHEST - 2 VIEW COMPARISON:  10/20/2017. FINDINGS: The heart is mildly enlarged and there is evidence of prior cardiothoracic surgery. Mild central peribronchial cuffing is noted bilaterally. No consolidation, effusion, or pneumothorax is seen. Degenerative changes are noted in the thoracic spine. IMPRESSION: 1. Cardiomegaly. 2. Mild peribronchial cuffing bilaterally, possible bronchitis. Electronically Signed   By: Brett Fairy M.D.   On: 02/07/2022 03:32   CT CORONARY MORPH W/CTA COR W/SCORE W/CA W/CM &/OR WO/CM  Addendum Date: 01/13/2022   ADDENDUM REPORT: 01/13/2022 13:11 CLINICAL DATA:  Aortic stenosis EXAM: Cardiac TAVR CT TECHNIQUE: The patient was scanned on a Siemens Force 734 slice scanner. A 120 kV retrospective scan was triggered in the descending thoracic aorta at 111 HU's. Gantry rotation speed was 270 msecs  and collimation was .9 mm. No beta blockade or nitro were given. The 3D data set was reconstructed in 5% intervals of the R-R cycle. Systolic and diastolic phases were analyzed on a dedicated work station using MPR, MIP and VRT modes. The patient received 80 cc of contrast. FINDINGS: Aortic Valve: Tri leaflet valve calcium score 1705 Aorta: Normal arch vessels no aneurysm mild calcific atherosclerosis Sinotubular Junction: 27.4 mm Ascending Thoracic Aorta: 3.5 cm Aortic Arch: 28 mm Descending Thoracic Aorta: 24 mm Sinus of Valsalva Measurements: Non-coronary: 26.7  mm Right - coronary: 28.4 mm Left - coronary: 28.8 mm Coronary Artery Height above Annulus: Left Main: 11.5 mm Right Coronary: 12.9 mm above annulus Virtual Basal Annulus Measurements: Maximum/Minimum Diameter: 24.6 mm x 19.9 mm Perimeter: 72.7 mm Area: 400 mm2 Coronary Arteries: Patient LIMA to LAD Occluded SVG to OM Optimum Fluoroscopic Angle for Delivery: LAO 9 Cranial 4 degrees IMPRESSION: 1. Tri leaflet AV with calcium score 1705 2.  Annular area of 400 mm2 suitable for a 23 mm Sapien 3 3. Optimum angiographic angle for deployment LAO 9 Cranial 4 degrees 4.  Normal ascending thoracic aorta 3.5 cm 5.  Occluded SVG to OM and patent LIMA to LAD 6.  Membranous Septum length 6.3 mm Jenkins Rouge Electronically Signed   By: Jenkins Rouge M.D.   On: 01/13/2022 13:11   Result Date: 01/13/2022 EXAM: OVER-READ INTERPRETATION  CT CHEST The following report is an over-read performed by radiologist Dr. Vinnie Langton of [facility], [place] on 01/13/2022. This over-read does not include interpretation of cardiac or coronary anatomy or pathology. The coronary calcium score/coronary CTA interpretation by the cardiologist is attached. COMPARISON:  Chest CTA 06/17/2015. FINDINGS: Extracardiac findings will be described separately under dictation for contemporaneously obtained CTA chest, abdomen and pelvis. IMPRESSION: Please see separate dictation for  contemporaneously obtained CTA chest, abdomen and pelvis dated 01/13/2022 for full description of relevant extracardiac findings. Electronically Signed: By: Vinnie Langton M.D. On: 01/13/2022 11:11   CT ANGIO CHEST AORTA W/CM & OR WO/CM  Result Date: 01/13/2022 CLINICAL DATA:  66 year old female with history of severe aortic stenosis. Preprocedural study prior to potential transcatheter aortic valve replacement (TAVR) procedure. EXAM: CT ANGIOGRAPHY CHEST, ABDOMEN AND PELVIS TECHNIQUE: Multidetector CT imaging through the chest, abdomen and pelvis was performed using the standard protocol during bolus administration of intravenous contrast. Multiplanar  reconstructed images and MIPs were obtained and reviewed to evaluate the vascular anatomy. RADIATION DOSE REDUCTION: This exam was performed according to the departmental dose-optimization program which includes automated exposure control, adjustment of the mA and/or kV according to patient size and/or use of iterative reconstruction technique. CONTRAST:  12mL OMNIPAQUE IOHEXOL 350 MG/ML SOLN COMPARISON:  Chest CTA 06/17/2015. FINDINGS: CTA CHEST FINDINGS Cardiovascular: Heart size is normal. There is no significant pericardial fluid, thickening or pericardial calcification. There is aortic atherosclerosis, as well as atherosclerosis of the great vessels of the mediastinum and the coronary arteries, including calcified atherosclerotic plaque in the left main, left anterior descending, left circumflex and right coronary arteries. Status post median sternotomy for CABG including LIMA to the LAD. Severe thickening and calcification of the aortic valve. Mediastinum/Lymph Nodes: No pathologically enlarged mediastinal or hilar lymph nodes. Esophagus is unremarkable in appearance. No axillary lymphadenopathy. Lungs/Pleura: 9 mm left upper lobe pulmonary nodule (axial image 33 of series 5), similar in retrospect compared to prior chest CT from 2016 which point this was  obscured by overlying airspace disease. No other suspicious appearing pulmonary nodules or masses are noted. No acute consolidative airspace disease. No pleural effusions. Musculoskeletal/Soft Tissues: Median sternotomy wires. There are no aggressive appearing lytic or blastic lesions noted in the visualized portions of the skeleton. CTA ABDOMEN AND PELVIS FINDINGS Hepatobiliary: Diffuse low attenuation throughout the hepatic parenchyma, indicative of hepatic steatosis. No suspicious cystic or solid hepatic lesions. No intra or extrahepatic biliary ductal dilatation. Gallbladder is not visualized, presumably surgically absent. Pancreas: No pancreatic mass. No pancreatic ductal dilatation. No pancreatic or peripancreatic fluid collections or inflammatory changes. Spleen: Unremarkable. Adrenals/Urinary Tract: Mild multifocal cortical thinning in the kidneys bilaterally, presumably chronic post infectious or inflammatory scarring. No suspicious renal lesions. No hydroureteronephrosis. Urinary bladder is largely obscured by beam hardening artifact from the patient's bilateral hip arthroplasties. Stomach/Bowel: The appearance of the stomach is normal. There is no pathologic dilatation of small bowel or colon. The appendix is not confidently identified and may be surgically absent. Regardless, there are no inflammatory changes noted adjacent to the cecum to suggest the presence of an acute appendicitis at this time. Vascular/Lymphatic: Aortic atherosclerosis, with vascular findings and measurements pertinent to potential T AVR procedure, as detailed below. No aneurysm or dissection noted in the abdominal or pelvic vasculature. No lymphadenopathy noted in the abdomen or pelvis. Reproductive: Status post hysterectomy. Right ovary is unremarkable in appearance. In the left adnexal region (axial image 205 of series 4 and coronal image 85 of series 6) there is a 3.6 x 2.8 x 2.9 cm intermediate attenuation lesion which is  incompletely characterized. Other: No significant volume of ascites.  No pneumoperitoneum. Musculoskeletal: Status post bilateral total hip arthroplasties. There are no aggressive appearing lytic or blastic lesions noted in the visualized portions of the skeleton. VASCULAR MEASUREMENTS PERTINENT TO TAVR: AORTA: Minimal Aortic Diameter-9 x 8 mm Severity of Aortic Calcification-severe RIGHT PELVIS: Right Common Iliac Artery - Minimal Diameter-7.5 x 4.7 mm Tortuosity-mild Calcification-severe Right External Iliac Artery - Minimal Diameter-6.7 x 6.8 mm Tortuosity-mild Calcification-none Right Common Femoral Artery - Nondiagnostic quality secondary to extensive beam hardening artifact from bilateral hip arthroplasties. LEFT PELVIS: Left Common Iliac Artery - Minimal Diameter-6.6 x 6.2 mm Tortuosity-mild Calcification-moderate Left External Iliac Artery - Minimal Diameter-6.9 x 6.9 mm Tortuosity-mild Calcification-none Left Common Femoral Artery - Nondiagnostic quality on multiplanar reconstructions secondary to extensive beam hardening artifact from bilateral hip arthroplasties. On axial source images, the vessel appears patent with  a mean diameter of approximately 6 mm. Review of the MIP images confirms the above findings. IMPRESSION: 1. Vascular findings and measurements pertinent to potential TAVR procedure, as detailed above. There is a very limited evaluation of the common femoral arteries bilaterally secondary to beam hardening artifact from the patient's bilateral hip arthroplasties. 2. Severe thickening and calcification of the aortic valve, compatible with reported clinical history of severe aortic stenosis. 3. 9 mm left upper lobe pulmonary nodule. This appears similar in retrospect to remote prior study from 2016 at which point this was largely obscured by overlying airspace consolidation. This is strongly favored to be benign, however, one additional noncontrast chest CT is recommended in 12 months to ensure  the stability of this finding. 4. Left adnexal lesion measuring 3.6 x 2.8 x 2.9 cm which is intermediate attenuation. This is abnormal in a postmenopausal patient, and further evaluation with nonemergent pelvic ultrasound is recommended in the near future to better evaluate this finding. 5. Aortic atherosclerosis, in addition to left main and three-vessel coronary artery disease. Status post median sternotomy for CABG including LIMA to the LAD. 6. Hepatic steatosis. 7. Additional incidental findings, as above. Electronically Signed   By: Vinnie Langton M.D.   On: 01/13/2022 12:21   Structural Heart Procedure  Result Date: 02/10/2022 See surgical note for result.  CT ANGIO ABDOMEN PELVIS  W &/OR WO CONTRAST  Result Date: 01/13/2022 CLINICAL DATA:  66 year old female with history of severe aortic stenosis. Preprocedural study prior to potential transcatheter aortic valve replacement (TAVR) procedure. EXAM: CT ANGIOGRAPHY CHEST, ABDOMEN AND PELVIS TECHNIQUE: Multidetector CT imaging through the chest, abdomen and pelvis was performed using the standard protocol during bolus administration of intravenous contrast. Multiplanar reconstructed images and MIPs were obtained and reviewed to evaluate the vascular anatomy. RADIATION DOSE REDUCTION: This exam was performed according to the departmental dose-optimization program which includes automated exposure control, adjustment of the mA and/or kV according to patient size and/or use of iterative reconstruction technique. CONTRAST:  141mL OMNIPAQUE IOHEXOL 350 MG/ML SOLN COMPARISON:  Chest CTA 06/17/2015. FINDINGS: CTA CHEST FINDINGS Cardiovascular: Heart size is normal. There is no significant pericardial fluid, thickening or pericardial calcification. There is aortic atherosclerosis, as well as atherosclerosis of the great vessels of the mediastinum and the coronary arteries, including calcified atherosclerotic plaque in the left main, left anterior descending,  left circumflex and right coronary arteries. Status post median sternotomy for CABG including LIMA to the LAD. Severe thickening and calcification of the aortic valve. Mediastinum/Lymph Nodes: No pathologically enlarged mediastinal or hilar lymph nodes. Esophagus is unremarkable in appearance. No axillary lymphadenopathy. Lungs/Pleura: 9 mm left upper lobe pulmonary nodule (axial image 33 of series 5), similar in retrospect compared to prior chest CT from 2016 which point this was obscured by overlying airspace disease. No other suspicious appearing pulmonary nodules or masses are noted. No acute consolidative airspace disease. No pleural effusions. Musculoskeletal/Soft Tissues: Median sternotomy wires. There are no aggressive appearing lytic or blastic lesions noted in the visualized portions of the skeleton. CTA ABDOMEN AND PELVIS FINDINGS Hepatobiliary: Diffuse low attenuation throughout the hepatic parenchyma, indicative of hepatic steatosis. No suspicious cystic or solid hepatic lesions. No intra or extrahepatic biliary ductal dilatation. Gallbladder is not visualized, presumably surgically absent. Pancreas: No pancreatic mass. No pancreatic ductal dilatation. No pancreatic or peripancreatic fluid collections or inflammatory changes. Spleen: Unremarkable. Adrenals/Urinary Tract: Mild multifocal cortical thinning in the kidneys bilaterally, presumably chronic post infectious or inflammatory scarring. No suspicious renal lesions. No hydroureteronephrosis.  Urinary bladder is largely obscured by beam hardening artifact from the patient's bilateral hip arthroplasties. Stomach/Bowel: The appearance of the stomach is normal. There is no pathologic dilatation of small bowel or colon. The appendix is not confidently identified and may be surgically absent. Regardless, there are no inflammatory changes noted adjacent to the cecum to suggest the presence of an acute appendicitis at this time. Vascular/Lymphatic: Aortic  atherosclerosis, with vascular findings and measurements pertinent to potential T AVR procedure, as detailed below. No aneurysm or dissection noted in the abdominal or pelvic vasculature. No lymphadenopathy noted in the abdomen or pelvis. Reproductive: Status post hysterectomy. Right ovary is unremarkable in appearance. In the left adnexal region (axial image 205 of series 4 and coronal image 85 of series 6) there is a 3.6 x 2.8 x 2.9 cm intermediate attenuation lesion which is incompletely characterized. Other: No significant volume of ascites.  No pneumoperitoneum. Musculoskeletal: Status post bilateral total hip arthroplasties. There are no aggressive appearing lytic or blastic lesions noted in the visualized portions of the skeleton. VASCULAR MEASUREMENTS PERTINENT TO TAVR: AORTA: Minimal Aortic Diameter-9 x 8 mm Severity of Aortic Calcification-severe RIGHT PELVIS: Right Common Iliac Artery - Minimal Diameter-7.5 x 4.7 mm Tortuosity-mild Calcification-severe Right External Iliac Artery - Minimal Diameter-6.7 x 6.8 mm Tortuosity-mild Calcification-none Right Common Femoral Artery - Nondiagnostic quality secondary to extensive beam hardening artifact from bilateral hip arthroplasties. LEFT PELVIS: Left Common Iliac Artery - Minimal Diameter-6.6 x 6.2 mm Tortuosity-mild Calcification-moderate Left External Iliac Artery - Minimal Diameter-6.9 x 6.9 mm Tortuosity-mild Calcification-none Left Common Femoral Artery - Nondiagnostic quality on multiplanar reconstructions secondary to extensive beam hardening artifact from bilateral hip arthroplasties. On axial source images, the vessel appears patent with a mean diameter of approximately 6 mm. Review of the MIP images confirms the above findings. IMPRESSION: 1. Vascular findings and measurements pertinent to potential TAVR procedure, as detailed above. There is a very limited evaluation of the common femoral arteries bilaterally secondary to beam hardening artifact from  the patient's bilateral hip arthroplasties. 2. Severe thickening and calcification of the aortic valve, compatible with reported clinical history of severe aortic stenosis. 3. 9 mm left upper lobe pulmonary nodule. This appears similar in retrospect to remote prior study from 2016 at which point this was largely obscured by overlying airspace consolidation. This is strongly favored to be benign, however, one additional noncontrast chest CT is recommended in 12 months to ensure the stability of this finding. 4. Left adnexal lesion measuring 3.6 x 2.8 x 2.9 cm which is intermediate attenuation. This is abnormal in a postmenopausal patient, and further evaluation with nonemergent pelvic ultrasound is recommended in the near future to better evaluate this finding. 5. Aortic atherosclerosis, in addition to left main and three-vessel coronary artery disease. Status post median sternotomy for CABG including LIMA to the LAD. 6. Hepatic steatosis. 7. Additional incidental findings, as above. Electronically Signed   By: Vinnie Langton M.D.   On: 01/13/2022 12:21   Disposition   Pt is being discharged home today in good condition.  Follow-up Plans & Appointments     Follow-up Information     Eileen Stanford, PA-C. Go on 02/18/2022.   Specialties: Cardiology, Radiology Why: @ 1:30pm, please arrive at least 10 minutes early. Contact information: [address] [place]  [id number] [phone]                  Discharge Medications   Allergies as of 02/11/2022  Reactions   Iodinated Contrast Media Rash   Very large itchy rash within 24 hours of heart cath         Medication List     STOP taking these medications    predniSONE 50 MG tablet Commonly known as: DELTASONE       TAKE these medications    allopurinol 100 MG tablet Commonly known as: ZYLOPRIM Take 100 mg by mouth daily.   aspirin EC 81 MG tablet Take 81 mg by mouth 2 (two) times daily.    atorvastatin 80 MG tablet Commonly known as: LIPITOR TAKE ONE TABLET BY MOUTH ONCE DAILY AT 6 P.M.   Fish Oil 1000 MG Caps Take 1,000 mg by mouth daily.   furosemide 20 MG tablet Commonly known as: LASIX Take 1 tablet (20 mg total) by mouth daily.   glimepiride 2 MG tablet Commonly known as: AMARYL Take 1-2 mg by mouth See admin instructions. 1 mg in the morning, 2 mg at bedtime   isosorbide mononitrate 30 MG 24 hr tablet Commonly known as: IMDUR Take 0.5 tablets (15 mg total) by mouth daily.   losartan 25 MG tablet Commonly known as: COZAAR Take 0.5 tablets (12.5 mg total) daily by mouth.   metoprolol tartrate 25 MG tablet Commonly known as: LOPRESSOR Take 25 mg by mouth 2 (two) times daily.   multivitamin with minerals Tabs tablet Take 1 tablet by mouth daily.   potassium chloride SA 20 MEQ tablet Commonly known as: KLOR-CON M Take 1 tablet (20 mEq total) by mouth daily. NEED OV.         Outstanding Labs/Studies   none  Duration of Discharge Encounter   Greater than 30 minutes including physician time.  Signed, Angelena Form, PA-C 02/11/2022, 9:56 AM [phone]  I have personally seen and examined this patient. I agree with the assessment and plan as outlined above.  She is doing well post TAVR. Groins stable. BP stable. Sinus on tele with no heart block.  Discharge home today.   Lauree Chandler 02/11/2022 10:33 AM

## 2022-02-11 NOTE — TOC Benefit Eligibility Note (Signed)
Patient Teacher, English as a foreign language completed.    The patient is currently admitted and upon discharge could be taking Farxiga 10 mg.  The current 30 day co-pay is, $47.00.   The patient is currently admitted and upon discharge could be taking Jardiance 10 mg.  The current 30 day co-pay is, $47.00.   The patient is insured through [place], [place] Patient Advocate Specialist [place] Patient Advocate Team Direct Number: [phone]  Fax: [phone]

## 2022-02-12 ENCOUNTER — Encounter (HOSPITAL_COMMUNITY): Payer: Self-pay | Admitting: Radiology

## 2022-02-12 ENCOUNTER — Telehealth: Payer: Self-pay | Admitting: Cardiology

## 2022-02-12 LAB — ECHOCARDIOGRAM COMPLETE
AR max vel: 2.05 cm2
AV Area VTI: 2.06 cm2
AV Area mean vel: 2.01 cm2
AV Mean grad: 10 mmHg
AV Peak grad: 17.6 mmHg
Ao pk vel: 2.1 m/s
Area-P 1/2: 2.46 cm2
Height: 63 in
MV VTI: 1.93 cm2
S' Lateral: 2.9 cm
Weight: 3707.2 oz

## 2022-02-12 NOTE — Telephone Encounter (Signed)
°  [facility]   MULTIDISCIPLINARY HEART VALVE TEAM   TOC call placed 2/23 at 0844am with no answer. LVM to call back. Will attempt later today.   Kathyrn Drown NP-C Structural Heart Team  Pager: [phone] Phone: [phone]

## 2022-02-13 LAB — ECHOCARDIOGRAM LIMITED
AR max vel: 3.35 cm2
AV Area VTI: 3.09 cm2
AV Area mean vel: 3.18 cm2
AV Mean grad: 5 mmHg
AV Peak grad: 10.1 mmHg
Ao pk vel: 1.59 m/s

## 2022-02-16 NOTE — Progress Notes (Signed)
HE[facility] [name]                                     Cardiology Office Note:    Date:  02/18/2022   ID:  Karina Green, DOB 10/04/1956, MRN [id number]  PCP:  [place] Cardiologist:  Carlyle Dolly, MD / Dr. Angelena Form & Dr. Cyndia Bent (TAVR) [facility] Electrophysiologist:  None   Referring MD: [facility]*   TOC s/p TAVR  History of Present Illness:    Karina Green is a 66 y.o. female with a hx of CAD s/p CABG 10/2014 (LIMA-LAD/SVG-OM), morbid obesity (BMI 41), HTN, HLD, DM2, and paradoxical LFLG aortic stenosis s/p TAVR (02/10/22) who presents to clinic for follow up.   Patient has developed worsening dyspnea on exertion. Her most recent echo on 12/08/2021 showed an EF of 55-60%, a bicuspid aortic valve with moderate calcification and thickening with a mean gradient of 28 mmHg, peak gradient of 43 mmHg, AVA 0.71 cm and mild aortic insufficiency. She was referred to structural heart for evaluation. [facility] 12/29/21 showed moderately severe 70% distal left main stenosis, severe 75% proximal LAD stenosis, moderate 50% proximal circumflex stenosis, patent RCA with mild plaquing, dominant vessel. Continued patency of the LIMA-LAD, chronic occlusion of the SVG-OM and severe calcific aortic stenosis with peak to peak gradient of 66 mmHg.   She was evaluated by the multidisciplinary valve team and underwent successful TAVR with a 29 mm Medtronic Evolut FX THV via the TF approach on 02/10/22. Post operative echo showed EF 55%, normally functioning TAVR with a mean gradient of 10 mmHg and no PVL. She was discharged on aspirin.   Today the patient presents to clinic for follow up. She is here alone. No CP or SOB. No LE edema, orthopnea or PND. No dizziness or syncope. No blood in stool or urine. No palpitations. Has had a big improvement in breathing and stamina since surgery  Past Medical History:   Diagnosis Date   Arthritis    Back pain    CAD (coronary artery disease), native coronary artery-70% LM stenosis 10/25/2014   Cough last 30 yrs    nonproductive, no fevers   DM type 2 (diabetes mellitus, type 2) ([place]) 10/25/2014   GERD (gastroesophageal reflux disease)    Gout    Hyperlipidemia    Hypertension    S/P TAVR (transcatheter aortic valve replacement) 02/10/2022   s/p TAVR with 29 mm Medtronic Evolut Fx via the TF approach by Dr. Angelena Form & Dr. Cyndia Bent   Severe aortic stenosis     Past Surgical History:  Procedure Laterality Date   ABDOMINAL HYSTERECTOMY     complete   APPENDECTOMY     bladder tack     CARDIAC CATHETERIZATION  10/25/14   70% LM stensis, normal EF   CHOLECYSTECTOMY     CORONARY ARTERY BYPASS GRAFT N/A 10/29/2014   Procedure: CORONARY ARTERY BYPASS GRAFTING (CABG) TIMES TWO USING LEFT INTERNAL MAMMARY ARTERY AND RIGHT LEG GREATER SAPHENOUS VEIN HARVESTED ENDOSCOPICALLY.;  Surgeon: Ivin Poot, MD;  Location: [place];  Service: Open Heart Surgery;  Laterality: N/A;   HERNIA REPAIR     INTRAOPERATIVE TRANSESOPHAGEAL ECHOCARDIOGRAM N/A 10/29/2014   Procedure: INTRAOPERATIVE TRANSESOPHAGEAL ECHOCARDIOGRAM;  Surgeon: Ivin Poot, MD;  Location: [place];  Service: Open Heart Surgery;  Laterality: N/A;  INTRAOPERATIVE TRANSTHORACIC ECHOCARDIOGRAM N/A 02/10/2022   Procedure: INTRAOPERATIVE TRANSTHORACIC ECHOCARDIOGRAM;  Surgeon: Burnell Blanks, MD;  Location: [place];  Service: Open Heart Surgery;  Laterality: N/A;   JOINT REPLACEMENT Right 2009   hip total   LEFT HEART CATHETERIZATION WITH CORONARY ANGIOGRAM N/A 10/26/2014   Procedure: LEFT HEART CATHETERIZATION WITH CORONARY ANGIOGRAM;  Surgeon: Troy Sine, MD;  Location: [facility] CATH LAB;  Service: Cardiovascular;  Laterality: N/A;   RIGHT/LEFT HEART CATH AND CORONARY/GRAFT ANGIOGRAPHY N/A 12/29/2021   Procedure: RIGHT/LEFT HEART CATH AND CORONARY/GRAFT ANGIOGRAPHY;  Surgeon: Sherren Mocha, MD;   Location: [place] [facility];  Service: Cardiovascular;  Laterality: N/A;   TONSILLECTOMY     TOTAL HIP ARTHROPLASTY     TOTAL HIP ARTHROPLASTY Left 05/07/2016   Procedure: TOTAL HIP ARTHROPLASTY ANTERIOR APPROACH;  Surgeon: Rod Can, MD;  Location: [facility];  Service: Orthopedics;  Laterality: Left;   TRANSCATHETER AORTIC VALVE REPLACEMENT, TRANSFEMORAL N/A 02/10/2022   Procedure: TRANSCATHETER AORTIC VALVE REPLACEMENT, TRANSFEMORAL USING A 29MM MEDTRONIC EVOLUT FX TRANSCATHETER AORTIC VALVE.;  Surgeon: Burnell Blanks, MD;  Location: [place];  Service: Open Heart Surgery;  Laterality: N/A;    Current Medications: Current Meds  Medication Sig   allopurinol (ZYLOPRIM) 100 MG tablet Take 100 mg by mouth daily.   aspirin EC 81 MG tablet Take 81 mg by mouth daily.   atorvastatin (LIPITOR) 80 MG tablet TAKE ONE TABLET BY MOUTH ONCE DAILY AT 6 P.M.   dapagliflozin propanediol (FARXIGA) 5 MG TABS tablet Take 1 tablet (5 mg total) by mouth daily before breakfast.   furosemide (LASIX) 20 MG tablet Take 1 tablet (20 mg total) by mouth daily.   glimepiride (AMARYL) 2 MG tablet Take 1-2 mg by mouth See admin instructions. 1 mg in the morning, 2 mg at bedtime   isosorbide mononitrate (IMDUR) 30 MG 24 hr tablet Take 0.5 tablets (15 mg total) by mouth daily.   losartan (COZAAR) 25 MG tablet Take 0.5 tablets (12.5 mg total) daily by mouth.   metoprolol tartrate (LOPRESSOR) 25 MG tablet Take 25 mg by mouth 2 (two) times daily.   Multiple Vitamin (MULTIVITAMIN WITH MINERALS) TABS tablet Take 1 tablet by mouth daily.   nystatin ointment (MYCOSTATIN) Apply 1 application topically 2 (two) times daily.   Omega-3 Fatty Acids (FISH OIL) 1000 MG CAPS Take 1,000 mg by mouth daily.   potassium chloride SA (K-DUR,KLOR-CON) 20 MEQ tablet Take 1 tablet (20 mEq total) by mouth daily. NEED OV.   Current Facility-Administered Medications for the 02/18/22 encounter (Office Visit) with [facility]   Medication   sodium chloride flush (NS) 0.9 % injection 3 mL     Allergies:   Iodinated contrast media   Social History   Socioeconomic History   Marital status: Divorced    Spouse name: Not on file   Number of children: Not on file   Years of education: Not on file   Highest education level: Not on file  Occupational History   Not on file  Tobacco Use   Smoking status: Never   Smokeless tobacco: Never  Vaping Use   Vaping Use: Never used  Substance and Sexual Activity   Alcohol use: No    Alcohol/week: 0.0 standard drinks   Drug use: No   Sexual activity: Not on file  Other Topics Concern   Not on file  Social History Narrative   Not on file   Social Determinants of Health   Financial Resource Strain:  Not on file  Food Insecurity: Not on file  Transportation Needs: Not on file  Physical Activity: Not on file  Stress: Not on file  Social Connections: Not on file     Family History: The patient's family history includes CAD in her father; Coronary artery disease in her brother and mother; Hypertension in her father and mother.  ROS:   Please see the history of present illness.    All other systems reviewed and are negative.  EKGs/Labs/Other Studies Reviewed:    The following studies were reviewed today:  TAVR OPERATIVE NOTE    MICHAELYN WALL [id number]   Date of Procedure:                 02/10/2022   Preoperative Diagnosis:      Severe Aortic Stenosis    Postoperative Diagnosis:    Same    Procedure:        Transcatheter Aortic Valve Replacement - Percutaneous Right Transfemoral Approach             Medtronic Evolut FX  (size 29 mm, model # EVOLUTFX-29, serial # [id number])              Co-Surgeons:            Gaye Pollack, MD and Lauree Chandler, MD     Anesthesiologist:                  Annye Asa, MD   Echocardiographer:              Sanda Klein, MD   Pre-operative Echo Findings: Severe aortic stenosis   Normal left  ventricular systolic function   Post-operative Echo Findings: No paravalvular leak Normal left ventricular systolic function _____________     Echo 02/11/22:  IMPRESSIONS   1. The aortic valve has been replaced by a 29 mm Medtronic Evolut Valve.  Aortic valve regurgitation is not visualized (no PVL). Effective orifice  area, by VTI measures 2.06 cm. Aortic valve mean gradient measures 10.0  mmHg and peak gradient of 18 mmHg.   Aortic valve Vmax measures 2.10 m/s. Acceleration time 60 ms.   2. Left ventricular ejection fraction, by estimation, is 55 to 60%. The  left ventricle has normal function. The left ventricle has no regional  wall motion abnormalities. There is mild concentric left ventricular  hypertrophy. Left ventricular diastolic  parameters are consistent with Grade III diastolic dysfunction  (restrictive).   3. Right ventricular systolic function is normal. The right ventricular  size is mildly enlarged. There is normal pulmonary artery systolic  pressure. The estimated right ventricular systolic pressure is 05.3 mmHg.   4. The mitral valve is degenerative. No evidence of mitral valve  regurgitation. Mild mitral stenosis. The mean mitral valve gradient is 5.7  mmHg with average heart rate of 76 bpm.   5. The inferior vena cava is normal in size with greater than 50%  respiratory variability, suggesting right atrial pressure of 3 mmHg.   Comparison(s): Well placed 29 mm Evolute Valve. MR has improved.   EKG:  EKG is ordered today.  The ekg ordered today demonstrates sinus HR 68   Recent Labs: 02/06/2022: ALT 34; B Natriuretic Peptide 77.4 02/11/2022: BUN 20; Creatinine, Ser 1.02; Hemoglobin 11.9; Magnesium 1.5; Platelets 182; Potassium 3.5; Sodium 140  Recent Lipid Panel    Component Value Date/Time   CHOL 134 02/11/2022 0216   TRIG 109 02/11/2022 0216   HDL 28 (L) 02/11/2022 0216  CHOLHDL 4.8 02/11/2022 0216   VLDL 22 02/11/2022 0216   LDLCALC 84 02/11/2022  0216     Risk Assessment/Calculations:       Physical Exam:    VS:  BP 132/72    Pulse 68    Ht 5\' 3"  (1.6 m)    Wt 227 lb 12.8 oz (103.3 kg)    SpO2 96%    BMI 40.35 kg/m     Wt Readings from Last 3 Encounters:  02/18/22 227 lb 12.8 oz (103.3 kg)  02/11/22 231 lb 11.2 oz (105.1 kg)  02/06/22 231 lb 8 oz (105 kg)     GEN:  Well nourished, well developed in no acute distress HEENT: Normal NECK: No JVD LYMPHATICS: No lymphadenopathy CARDIAC: RRR, no murmurs, rubs, gallops RESPIRATORY:  Clear to auscultation without rales, wheezing or rhonchi  ABDOMEN: Soft, non-tender, non-distended MUSCULOSKELETAL:  No edema; No deformity  SKIN: Warm and dry.  Groin sites clear without hematoma or ecchymosis Yeast infection in right groin NEUROLOGIC:  Alert and oriented x 3 PSYCHIATRIC:  Normal affect   ASSESSMENT:    1. S/P TAVR (transcatheter aortic valve replacement)   2. Type 2 diabetes mellitus with chronic kidney disease, without long-term current use of insulin, unspecified CKD stage ([place])   3. Essential hypertension   4. Coronary artery disease involving native coronary artery of native heart without angina pectoris   5. Morbid obesity, unspecified obesity type ([place])   6. Pulmonary nodule   7. Adnexal mass   8. Candidal intertrigo    PLAN:    In order of problems listed above:  Severe AS s/p TAVR: doing excellent 1 week out from TAVR. Has had a big improvement in breathing and stamina since surgery. ECG with no HAVB and groin sites are stable. SBE prophylaxis discussed; I have RX'd amoxicillin. Continue on aspirin alone (asked to change to 81mg  daily and not BID). I will see her back in 1 month for echo and follow up.    DMT2: during admission, cardiac pharmacist discussed adding Jardiance or Farixga for better blood sugar control and CV protection and renal protection. She is open to starting this now. Will start Farixga 5mg  daily and check a bmet at follow up.    HTN: BP  well controlled. No changes made   CAD s/p CABG: pre TAVR cath showed moderately severe 70% distal left main stenosis, severe 75% proximal LAD stenosis, moderate 50% proximal circumflex stenosis, patent RCA with mild plaquing, dominant vessel. Continued patency of the LIMA-LAD, chronic occlusion of the SVG-OM. Continue medical therapy with aspirin and high dose statin.    Morbid obesity: Body mass index is 41.04 kg/m. Continue diet and exercise.    Pulmonary nodule: pre TAVR scans showed a 9 mm left upper lobe pulmonary nodule. This appears similar in retrospect to remote prior study from 2016 at which point this was largely obscured by overlying airspace consolidation. This is strongly favored to be benign, however, one additional noncontrast chest CT is recommended in 12 months to ensure the stability of this finding. This will be set up at the 1 year visit    Left adnexal lesion: pre TAVR scans showed a 3.6 x 2.8 x 2.9 cm left adnexal lesion which is intermediate attenuation. This is abnormal in a postmenopausal patient, and further evaluation with nonemergent pelvic ultrasound is recommended in the near future to better evaluate this finding. I will order this now.   Groin yeast infection:  I have Rx'd Nystatin  Cardiac Rehabilitation Eligibility Assessment  The patient is ready to start cardiac rehabilitation from a cardiac standpoint.    Medication Adjustments/Labs and Tests Ordered: Current medicines are reviewed at length with the patient today.  Concerns regarding medicines are outlined above.  Orders Placed This Encounter  Procedures   US PELVIS (TRANSABDOMINAL ONLY)   US PELVIC COMPLETE WITH TRANSVAGINAL   EKG 12-Lead   Meds ordered this encounter  Medications   nystatin ointment (MYCOSTATIN)    Sig: Apply 1 application topically 2 (two) times daily.    Dispense:  30 g    Refill:  3    Order Specific Question:   Supervising Provider    Answer:   COOPER, MICHAEL [[id number]]    dapagliflozin propanediol (FARXIGA) 5 MG TABS tablet    Sig: Take 1 tablet (5 mg total) by mouth daily before breakfast.    Dispense:  30 tablet    Refill:  6    Patient Instructions  Medication Instructions:  Start Farxiga 5 mg daily   *If you need a refill on your cardiac medications before your next appointment, please call your pharmacy*   Lab Work: None ordered   If you have labs (blood work) drawn today and your tests are completely normal, you will receive your results only by: [place] (if you have MyChart) OR A paper copy in the mail If you have any lab test that is abnormal or we need to change your treatment, we will call you to review the results.   Testing/Procedures: Your physician has recommend that you have a abdominal ultrasound    Follow-Up: Follow up as scheduled    Other Instructions None     Signed, Angelena Form, PA-C  02/18/2022 2:06 PM    [place]

## 2022-02-17 LAB — POCT I-STAT, CHEM 8
BUN: 21 mg/dL (ref 8–23)
BUN: 20 mg/dL (ref 8–23)
Calcium, Ion: 1.31 mmol/L (ref 1.15–1.40)
Calcium, Ion: 1.29 mmol/L (ref 1.15–1.40)
Chloride: 105 mmol/L (ref 98–111)
Chloride: 105 mmol/L (ref 98–111)
Creatinine, Ser: 0.9 mg/dL (ref 0.44–1.00)
Creatinine, Ser: 0.9 mg/dL (ref 0.44–1.00)
Glucose, Bld: 287 mg/dL — ABNORMAL HIGH (ref 70–99)
Glucose, Bld: 285 mg/dL — ABNORMAL HIGH (ref 70–99)
HCT: 37 % (ref 36.0–46.0)
HCT: 37 % (ref 36.0–46.0)
Hemoglobin: 12.6 g/dL (ref 12.0–15.0)
Hemoglobin: 12.6 g/dL (ref 12.0–15.0)
Potassium: 3.8 mmol/L (ref 3.5–5.1)
Potassium: 3.8 mmol/L (ref 3.5–5.1)
Sodium: 136 mmol/L (ref 135–145)
Sodium: 139 mmol/L (ref 135–145)
TCO2: 24 mmol/L (ref 22–32)
TCO2: 23 mmol/L (ref 22–32)

## 2022-02-17 LAB — POCT I-STAT 7, (LYTES, BLD GAS, ICA,H+H)
Acid-base deficit: 4 mmol/L — ABNORMAL HIGH (ref 0.0–2.0)
Acid-base deficit: 4 mmol/L — ABNORMAL HIGH (ref 0.0–2.0)
Bicarbonate: 23.7 mmol/L (ref 20.0–28.0)
Bicarbonate: 22.5 mmol/L (ref 20.0–28.0)
Calcium, Ion: 1.3 mmol/L (ref 1.15–1.40)
Calcium, Ion: 1.29 mmol/L (ref 1.15–1.40)
HCT: 39 % (ref 36.0–46.0)
HCT: 38 % (ref 36.0–46.0)
Hemoglobin: 13.3 g/dL (ref 12.0–15.0)
Hemoglobin: 12.9 g/dL (ref 12.0–15.0)
O2 Saturation: 98 %
O2 Saturation: 94 %
Potassium: 3.8 mmol/L (ref 3.5–5.1)
Potassium: 3.8 mmol/L (ref 3.5–5.1)
Sodium: 139 mmol/L (ref 135–145)
Sodium: 139 mmol/L (ref 135–145)
TCO2: 25 mmol/L (ref 22–32)
TCO2: 24 mmol/L (ref 22–32)
pCO2 arterial: 52.7 mmHg — ABNORMAL HIGH (ref 32–48)
pCO2 arterial: 46.1 mmHg (ref 32–48)
pH, Arterial: 7.262 — ABNORMAL LOW (ref 7.35–7.45)
pH, Arterial: 7.296 — ABNORMAL LOW (ref 7.35–7.45)
pO2, Arterial: 115 mmHg — ABNORMAL HIGH (ref 83–108)
pO2, Arterial: 77 mmHg — ABNORMAL LOW (ref 83–108)

## 2022-02-18 ENCOUNTER — Ambulatory Visit: Payer: Medicare HMO | Admitting: Physician Assistant

## 2022-02-18 ENCOUNTER — Other Ambulatory Visit: Payer: Self-pay

## 2022-02-18 VITALS — BP 132/72 | HR 68 | Ht 63.0 in | Wt 227.8 lb

## 2022-02-18 DIAGNOSIS — E1122 Type 2 diabetes mellitus with diabetic chronic kidney disease: Secondary | ICD-10-CM | POA: Diagnosis not present

## 2022-02-18 DIAGNOSIS — I10 Essential (primary) hypertension: Secondary | ICD-10-CM | POA: Diagnosis not present

## 2022-02-18 DIAGNOSIS — R911 Solitary pulmonary nodule: Secondary | ICD-10-CM | POA: Diagnosis not present

## 2022-02-18 DIAGNOSIS — Z952 Presence of prosthetic heart valve: Secondary | ICD-10-CM | POA: Diagnosis not present

## 2022-02-18 DIAGNOSIS — N9489 Other specified conditions associated with female genital organs and menstrual cycle: Secondary | ICD-10-CM

## 2022-02-18 DIAGNOSIS — B372 Candidiasis of skin and nail: Secondary | ICD-10-CM | POA: Diagnosis not present

## 2022-02-18 DIAGNOSIS — I2510 Atherosclerotic heart disease of native coronary artery without angina pectoris: Secondary | ICD-10-CM | POA: Diagnosis not present

## 2022-02-18 MED ORDER — DAPAGLIFLOZIN PROPANEDIOL 5 MG PO TABS: 5.0000 mg | ORAL_TABLET | Freq: Every day | ORAL | 6 refills | Status: DC

## 2022-02-18 MED ORDER — NYSTATIN 100000 UNIT/GM EX OINT: 1.0000 "application " | TOPICAL_OINTMENT | Freq: Two times a day (BID) | CUTANEOUS | 3 refills | Status: DC

## 2022-02-18 NOTE — Patient Instructions (Addendum)
Medication Instructions:  ?Start Farxiga 5 mg daily  ? ?*If you need a refill on your cardiac medications before your next appointment, please call your pharmacy* ? ? ?Lab Work: ?None ordered  ? ?If you have labs (blood work) drawn today and your tests are completely normal, you will receive your results only by: ?MyChart Message (if you have MyChart) OR ?A paper copy in the mail ?If you have any lab test that is abnormal or we need to change your treatment, we will call you to review the results. ? ? ?Testing/Procedures: ?Your physician has recommend that you have a abdominal ultrasound  ? ? ?Follow-Up: ?Follow up as scheduled  ? ? ?Other Instructions ?None   ?

## 2022-02-24 ENCOUNTER — Other Ambulatory Visit: Payer: Self-pay

## 2022-02-24 ENCOUNTER — Ambulatory Visit (HOSPITAL_COMMUNITY)
Admission: RE | Admit: 2022-02-24 | Discharge: 2022-02-24 | Disposition: A | Discharge status: Home / Self Care | Payer: Medicare HMO | Source: Ambulatory Visit | Attending: Physician Assistant | Admitting: Physician Assistant

## 2022-02-24 DIAGNOSIS — N9489 Other specified conditions associated with female genital organs and menstrual cycle: Secondary | ICD-10-CM | POA: Insufficient documentation

## 2022-02-24 DIAGNOSIS — N838 Other noninflammatory disorders of ovary, fallopian tube and broad ligament: Secondary | ICD-10-CM | POA: Diagnosis not present

## 2022-02-24 DIAGNOSIS — N83292 Other ovarian cyst, left side: Secondary | ICD-10-CM | POA: Diagnosis not present

## 2022-02-24 DIAGNOSIS — Z90710 Acquired absence of both cervix and uterus: Secondary | ICD-10-CM | POA: Diagnosis not present

## 2022-03-03 ENCOUNTER — Ambulatory Visit: Payer: Medicare HMO | Admitting: Cardiology

## 2022-03-05 NOTE — Progress Notes (Signed)
?[facility]   ?[facility] ?                                    ?Cardiology Office Note:   ? ?Date:  03/06/2022  ? ?ID:  SUNSET JOSHI, DOB [birth year], MRN [id number] ? ?PCP:  [facility]  ?[facility] Cardiologist:  Carlyle Dolly, MD / Dr. Angelena Form & Dr. Cyndia Bent (TAVR) ?[place] Electrophysiologist:  None  ? ?Referring MD: [facility]*  ? ?1 month s/p TAVR ? ?History of Present Illness:   ? ?KENLEE VOGT is a 66 y.o. female with a hx of CAD s/p CABG 10/2014 (LIMA-LAD/SVG-OM), morbid obesity (BMI 41), HTN, HLD, DM2, and paradoxical LFLG aortic stenosis s/p TAVR (02/10/22) who presents to clinic for follow up.  ? ?Patient has developed worsening dyspnea on exertion. Her most recent echo on 12/08/2021 showed an EF of 55-60%, a bicuspid aortic valve with moderate calcification and thickening with a mean gradient of 28 mmHg, peak gradient of 43 mmHg, AVA 0.71 cm? and mild aortic insufficiency. She was referred to structural heart for evaluation. [facility] 12/29/21 showed moderately severe 70% distal left main stenosis, severe 75% proximal LAD stenosis, moderate 50% proximal circumflex stenosis, patent RCA with mild plaquing, dominant vessel. There was continued patency of the LIMA-LAD, chronic occlusion of the SVG-OM and severe calcific aortic stenosis with peak to peak gradient of 66 mmHg. ?  ?She was evaluated by the multidisciplinary valve team and underwent successful TAVR with a 29 mm Medtronic Evolut FX THV via the TF approach on 02/10/22. Post operative echo showed EF 55%, normally functioning TAVR with a mean gradient of 10 mmHg and no PVL. She was discharged on aspirin. Has had a big improvement in breathing and stamina since surgery ? ?Today the patient presents to clinic for follow up. Did not tolerate Farixga. Made her dizzy. This resolved when she discontinued it. She fell on ice last week and has some soreness in left shoulder and  back. No CP or SOB. No LE edema, orthopnea or PND. No dizziness or syncope. No blood in stool or urine. No palpitations.  ? ? ?Past Medical History:  ?Diagnosis Date  ? Arthritis   ? Back pain   ? CAD (coronary artery disease), native coronary artery-70% LM stenosis 10/25/2014  ? Cough last 30 yrs   ? nonproductive, no fevers  ? DM type 2 (diabetes mellitus, type 2) ([place]) 10/25/2014  ? GERD (gastroesophageal reflux disease)   ? Gout   ? Hyperlipidemia   ? Hypertension   ? S/P TAVR (transcatheter aortic valve replacement) 02/10/2022  ? s/p TAVR with 29 mm Medtronic Evolut Fx via the TF approach by Dr. Angelena Form & Dr. Cyndia Bent  ? Severe aortic stenosis   ? ? ?Past Surgical History:  ?Procedure Laterality Date  ? ABDOMINAL HYSTERECTOMY    ? complete  ? APPENDECTOMY    ? bladder tack    ? CARDIAC CATHETERIZATION  10/25/14  ? 70% LM stensis, normal EF  ? CHOLECYSTECTOMY    ? CORONARY ARTERY BYPASS GRAFT N/A 10/29/2014  ? Procedure: CORONARY ARTERY BYPASS GRAFTING (CABG) TIMES TWO USING LEFT INTERNAL MAMMARY ARTERY AND RIGHT LEG GREATER SAPHENOUS VEIN HARVESTED ENDOSCOPICALLY.;  Surgeon: Ivin Poot, MD;  Location: [facility];  Service: Open Heart Surgery;  Laterality: N/A;  ? HERNIA REPAIR    ? INTRAOPERATIVE TRANSESOPHAGEAL  ECHOCARDIOGRAM N/A 10/29/2014  ? Procedure: INTRAOPERATIVE TRANSESOPHAGEAL ECHOCARDIOGRAM;  Surgeon: Ivin Poot, MD;  Location: [facility];  Service: Open Heart Surgery;  Laterality: N/A;  ? INTRAOPERATIVE TRANSTHORACIC ECHOCARDIOGRAM N/A 02/10/2022  ? Procedure: INTRAOPERATIVE TRANSTHORACIC ECHOCARDIOGRAM;  Surgeon: Burnell Blanks, MD;  Location: [place];  Service: Open Heart Surgery;  Laterality: N/A;  ? JOINT REPLACEMENT Right 2009  ? hip total  ? LEFT HEART CATHETERIZATION WITH CORONARY ANGIOGRAM N/A 10/26/2014  ? Procedure: LEFT HEART CATHETERIZATION WITH CORONARY ANGIOGRAM;  Surgeon: Troy Sine, MD;  [facility] CATH LAB;  Service: Cardiovascular;  Laterality: N/A;  ? RIGHT/LEFT HEART CATH  AND CORONARY/GRAFT ANGIOGRAPHY N/A 12/29/2021  ? Procedure: RIGHT/LEFT HEART CATH AND CORONARY/GRAFT ANGIOGRAPHY;  Surgeon: Sherren Mocha, MD;  Location: [place] CV LAB;  Service: Cardiovascular;  Laterality: N/A;  ? TONSILLECTOMY    ? TOTAL HIP ARTHROPLASTY    ? TOTAL HIP ARTHROPLASTY Left 05/07/2016  ? Procedure: TOTAL HIP ARTHROPLASTY ANTERIOR APPROACH;  Surgeon: Rod Can, MD;  Location: [facility];  Service: Orthopedics;  Laterality: Left;  ? TRANSCATHETER AORTIC VALVE REPLACEMENT, TRANSFEMORAL N/A 02/10/2022  ? Procedure: TRANSCATHETER AORTIC VALVE REPLACEMENT, TRANSFEMORAL USING A 29MM MEDTRONIC EVOLUT FX TRANSCATHETER AORTIC VALVE.;  Surgeon: Burnell Blanks, MD;  Location: [place];  Service: Open Heart Surgery;  Laterality: N/A;  ? ? ?Current Medications: ?Current Meds  ?Medication Sig  ? allopurinol (ZYLOPRIM) 100 MG tablet Take 100 mg by mouth daily.  ? amoxicillin (AMOXIL) 500 MG tablet Take 4 tablets (2,000 mg total) by mouth as directed. 1 hour prior to dental work including cleanings  ? aspirin EC 81 MG tablet Take 81 mg by mouth daily.  ? atorvastatin (LIPITOR) 80 MG tablet TAKE ONE TABLET BY MOUTH ONCE DAILY AT 6 P.M.  ? furosemide (LASIX) 20 MG tablet Take 1 tablet (20 mg total) by mouth daily.  ? glimepiride (AMARYL) 2 MG tablet Take 1-2 mg by mouth See admin instructions. 1 mg in the morning, 2 mg at bedtime  ? isosorbide mononitrate (IMDUR) 30 MG 24 hr tablet Take 0.5 tablets (15 mg total) by mouth daily.  ? losartan (COZAAR) 25 MG tablet Take 0.5 tablets (12.5 mg total) daily by mouth.  ? metoprolol tartrate (LOPRESSOR) 25 MG tablet Take 25 mg by mouth 2 (two) times daily.  ? Multiple Vitamin (MULTIVITAMIN WITH MINERALS) TABS tablet Take 1 tablet by mouth daily.  ? nystatin ointment (MYCOSTATIN) Apply 1 application topically 2 (two) times daily.  ? Omega-3 Fatty Acids (FISH OIL) 1000 MG CAPS Take 1,000 mg by mouth daily.  ? potassium chloride SA (K-DUR,KLOR-CON) 20 MEQ tablet Take 1  tablet (20 mEq total) by mouth daily. NEED OV.  ? ?Current Facility-Administered Medications for the 03/06/22 encounter (Office Visit) with [facility]  ?Medication  ? sodium chloride flush (NS) 0.9 % injection 3 mL  ?  ? ?Allergies:   Iodinated contrast media  ? ?Social History  ? ?Socioeconomic History  ? Marital status: Divorced  ?  Spouse name: Not on file  ? Number of children: Not on file  ? Years of education: Not on file  ? Highest education level: Not on file  ?Occupational History  ? Not on file  ?Tobacco Use  ? Smoking status: Never  ? Smokeless tobacco: Never  ?Vaping Use  ? Vaping Use: Never used  ?Substance and Sexual Activity  ? Alcohol use: No  ?  Alcohol/week: 0.0 standard drinks  ? Drug use: No  ?  Sexual activity: Not on file  ?Other Topics Concern  ? Not on file  ?Social History Narrative  ? Not on file  ? ?Social Determinants of Health  ? ?Financial Resource Strain: Not on file  ?Food Insecurity: Not on file  ?Transportation Needs: Not on file  ?Physical Activity: Not on file  ?Stress: Not on file  ?Social Connections: Not on file  ?  ? ?Family History: ?The patient's family history includes CAD in her father; Coronary artery disease in her brother and mother; Hypertension in her father and mother. ? ?ROS:   ?Please see the history of present illness.    ?All other systems reviewed and are negative. ? ?EKGs/Labs/Other Studies Reviewed:   ? ?The following studies were reviewed today: ? ?TAVR OPERATIVE NOTE ?  ? SHAQUASHA GERSTEL ?[id number] ?  ?Date of Procedure:                 02/10/2022 ?  ?Preoperative Diagnosis:      Severe Aortic Stenosis  ?  ?Postoperative Diagnosis:    Same  ?  ?Procedure:      ?  ?Transcatheter Aortic Valve Replacement - Percutaneous Right Transfemoral Approach ?            Medtronic Evolut FX  (size 29 mm, model # [id number], serial # [id number]) ?             ?Co-Surgeons:            Gaye Pollack, MD and Lauree Chandler, MD ?  ?  ?Anesthesiologist:                   Annye Asa, MD ?  ?Echocardiographer:              Sanda Klein, MD ?  ?Pre-operative Echo Findings: ?Severe aortic stenosis  ? Normal left ventricular systolic function ?  ?Post-op

## 2022-03-06 ENCOUNTER — Other Ambulatory Visit: Payer: Self-pay | Admitting: Physician Assistant

## 2022-03-06 ENCOUNTER — Ambulatory Visit: Payer: Medicare HMO | Admitting: Physician Assistant

## 2022-03-06 ENCOUNTER — Other Ambulatory Visit: Payer: Self-pay

## 2022-03-06 ENCOUNTER — Ambulatory Visit (HOSPITAL_COMMUNITY): Payer: Medicare HMO | Attending: Cardiology

## 2022-03-06 VITALS — BP 118/64 | HR 64 | Ht 63.0 in | Wt 229.6 lb

## 2022-03-06 DIAGNOSIS — Z952 Presence of prosthetic heart valve: Secondary | ICD-10-CM

## 2022-03-06 DIAGNOSIS — R911 Solitary pulmonary nodule: Secondary | ICD-10-CM

## 2022-03-06 DIAGNOSIS — E1122 Type 2 diabetes mellitus with diabetic chronic kidney disease: Secondary | ICD-10-CM

## 2022-03-06 DIAGNOSIS — N9489 Other specified conditions associated with female genital organs and menstrual cycle: Secondary | ICD-10-CM | POA: Diagnosis not present

## 2022-03-06 DIAGNOSIS — I10 Essential (primary) hypertension: Secondary | ICD-10-CM | POA: Diagnosis not present

## 2022-03-06 DIAGNOSIS — I2510 Atherosclerotic heart disease of native coronary artery without angina pectoris: Secondary | ICD-10-CM

## 2022-03-06 LAB — ECHOCARDIOGRAM COMPLETE
AV Mean grad: 6 mmHg
AV Peak grad: 10.4 mmHg
Ao pk vel: 1.61 m/s
Area-P 1/2: 1.74 cm2
S' Lateral: 2.7 cm

## 2022-03-06 MED ORDER — AMOXICILLIN 500 MG PO TABS
2000.0000 mg | ORAL_TABLET | ORAL | 12 refills | Status: DC
Start: 2022-03-06 — End: 2023-06-09

## 2022-03-06 NOTE — Patient Instructions (Signed)
Medication Instructions:  ? ?Your physician recommends that you continue on your current medications as directed. Please refer to the Current Medication list given to you today. ? ? ?*If you need a refill on your cardiac medications before your next appointment, please call your pharmacy* ? ? ?Lab Work:  [place] ? ? ?If you have labs (blood work) drawn today and your tests are completely normal, you will receive your results only by: ?MyChart Message (if you have MyChart) OR ?A paper copy in the mail ?If you have any lab test that is abnormal or we need to change your treatment, we will call you to review the results. ? ? ?Testing/Procedures: .NONE ORDERED  TODAY ? ? ?Follow-Up: ?At [facility], you and your health needs are our priority.  As part of our continuing mission to provide you with exceptional heart care, we have created designated Provider Care Teams.  These Care Teams include your primary Cardiologist (physician) and Advanced Practice Providers (APPs -  Physician Assistants and Nurse Practitioners) who all work together to provide you with the care you need, when you need it. ? ?We recommend signing up for the patient portal called "MyChart".  Sign up information is provided on this After Visit Summary.  MyChart is used to connect with patients for Virtual Visits (Telemedicine).  Patients are able to view lab/test results, encounter notes, upcoming appointments, etc.  Non-urgent messages can be sent to your provider as well.   ?To learn more about what you can do with MyChart, go to [email or web address].   ? ?Your next appointment:  AS SCHEDULED  ? ?The format for your next appointment:   ?In Person ? ?Provider:   ?Nell Range, PA-C  ? ? ?Other Instructions ? ?

## 2022-03-10 ENCOUNTER — Ambulatory Visit: Payer: Medicare HMO | Admitting: Cardiology

## 2022-06-22 ENCOUNTER — Ambulatory Visit
Admission: EM | Admit: 2022-06-22 | Discharge: 2022-06-22 | Disposition: A | Discharge status: Home / Self Care | Payer: Medicare HMO | Attending: Nurse Practitioner | Admitting: Nurse Practitioner

## 2022-06-22 DIAGNOSIS — R399 Unspecified symptoms and signs involving the genitourinary system: Secondary | ICD-10-CM | POA: Diagnosis not present

## 2022-06-22 LAB — POCT URINALYSIS DIP (MANUAL ENTRY)
Glucose, UA: 100 mg/dL — AB
Ketones, POC UA: NEGATIVE mg/dL
Nitrite, UA: POSITIVE — AB
Protein Ur, POC: >=300 mg/dL — AB
Spec Grav, UA: >=1.03 — AB (ref 1.010–1.025)
Urobilinogen, UA: 2 E.U./dL — AB
pH, UA: 5.5 (ref 5.0–8.0)

## 2022-06-22 MED ORDER — CIPROFLOXACIN HCL 500 MG PO TABS: 500.0000 mg | ORAL_TABLET | Freq: Two times a day (BID) | ORAL | 0 refills | Status: DC

## 2022-06-22 NOTE — ED Triage Notes (Signed)
Pt presents with c/o dysuria for past week as well as lower back pain that radiates into left leg

## 2022-06-22 NOTE — ED Provider Notes (Addendum)
[facility]    CSN: [id number] Arrival date & time: 06/22/22  0807      History   Chief Complaint Chief Complaint  Patient presents with   Dysuria    HPI Karina Green is a 66 y.o. female.   The history is provided by the patient.    Patient presents for complaints of urinary symptoms for the past week.  Symptoms include dysuria, and left-sided flank pain.  She denies fever, chills, foul-smelling urine, hematuria, urgency, frequency, hesitancy, abdominal pain, nausea, vomiting, or diarrhea.  Patient denies previous history of recurrent urinary tract infections, states that her last 1 was "a long time ago".  She has been taking Azo for her symptoms.  She states that the pain in her back radiates down into the left leg.  Past Medical History:  Diagnosis Date   Arthritis    Back pain    CAD (coronary artery disease), native coronary artery-70% LM stenosis 10/25/2014   Cough last 30 yrs    nonproductive, no fevers   DM type 2 (diabetes mellitus, type 2) ([place]) 10/25/2014   GERD (gastroesophageal reflux disease)    Gout    Hyperlipidemia    Hypertension    S/P TAVR (transcatheter aortic valve replacement) 02/10/2022   s/p TAVR with 29 mm Medtronic Evolut Fx via the TF approach by Dr. Angelena Form & Dr. Cyndia Bent   Severe aortic stenosis     Patient Active Problem List   Diagnosis Date Noted   S/P TAVR (transcatheter aortic valve replacement) 02/10/2022   Chronic diastolic heart failure ([place]) 06/20/2015   Severe aortic stenosis 06/20/2015   CKD (chronic kidney disease), stage III ([place]) 06/20/2015   CAD (coronary artery disease), native coronary artery-70% LM stenosis 10/26/2014   DM2 (diabetes mellitus, type 2) ([place]) 10/26/2014   Essential hypertension 10/24/2014   Hypercholesteremia 10/24/2014    Past Surgical History:  Procedure Laterality Date   ABDOMINAL HYSTERECTOMY     complete   APPENDECTOMY     bladder tack     CARDIAC CATHETERIZATION  10/25/14   70% LM  stensis, normal EF   CHOLECYSTECTOMY     CORONARY ARTERY BYPASS GRAFT N/A 10/29/2014   Procedure: CORONARY ARTERY BYPASS GRAFTING (CABG) TIMES TWO USING LEFT INTERNAL MAMMARY ARTERY AND RIGHT LEG GREATER SAPHENOUS VEIN HARVESTED ENDOSCOPICALLY.;  Surgeon: Ivin Poot, MD;  Location: [place];  Service: Open Heart Surgery;  Laterality: N/A;   HERNIA REPAIR     INTRAOPERATIVE TRANSESOPHAGEAL ECHOCARDIOGRAM N/A 10/29/2014   Procedure: INTRAOPERATIVE TRANSESOPHAGEAL ECHOCARDIOGRAM;  Surgeon: Ivin Poot, MD;  Location: [place];  Service: Open Heart Surgery;  Laterality: N/A;   INTRAOPERATIVE TRANSTHORACIC ECHOCARDIOGRAM N/A 02/10/2022   Procedure: INTRAOPERATIVE TRANSTHORACIC ECHOCARDIOGRAM;  Surgeon: Burnell Blanks, MD;  Location: [place];  Service: Open Heart Surgery;  Laterality: N/A;   JOINT REPLACEMENT Right 2009   hip total   LEFT HEART CATHETERIZATION WITH CORONARY ANGIOGRAM N/A 10/26/2014   Procedure: LEFT HEART CATHETERIZATION WITH CORONARY ANGIOGRAM;  Surgeon: Troy Sine, MD;  Location: [facility] CATH LAB;  Service: Cardiovascular;  Laterality: N/A;   RIGHT/LEFT HEART CATH AND CORONARY/GRAFT ANGIOGRAPHY N/A 12/29/2021   Procedure: RIGHT/LEFT HEART CATH AND CORONARY/GRAFT ANGIOGRAPHY;  Surgeon: Sherren Mocha, MD;  Location: [place] CV LAB;  Service: Cardiovascular;  Laterality: N/A;   TONSILLECTOMY     TOTAL HIP ARTHROPLASTY     TOTAL HIP ARTHROPLASTY Left 05/07/2016   Procedure: TOTAL HIP ARTHROPLASTY ANTERIOR APPROACH;  Surgeon: Rod Can, MD;  Location:  [facility];  Service: Orthopedics;  Laterality: Left;   TRANSCATHETER AORTIC VALVE REPLACEMENT, TRANSFEMORAL N/A 02/10/2022   Procedure: TRANSCATHETER AORTIC VALVE REPLACEMENT, TRANSFEMORAL USING A 29MM MEDTRONIC EVOLUT FX TRANSCATHETER AORTIC VALVE.;  Surgeon: Burnell Blanks, MD;  Location: [place];  Service: Open Heart Surgery;  Laterality: N/A;    OB History   No obstetric history on file.      Home Medications     Prior to Admission medications   Medication Sig Start Date End Date Taking? Authorizing Provider  ciprofloxacin (CIPRO) 500 MG tablet Take 1 tablet (500 mg total) by mouth 2 (two) times daily. 06/22/22  Yes Caelen Reierson-Warren, Alda Lea, NP  allopurinol (ZYLOPRIM) 100 MG tablet Take 100 mg by mouth daily. 09/16/17   [provider]  amoxicillin (AMOXIL) 500 MG tablet Take 4 tablets (2,000 mg total) by mouth as directed. 1 hour prior to dental work including cleanings 03/06/22   Eileen Stanford, PA-C  aspirin EC 81 MG tablet Take 81 mg by mouth daily.    [provider]  atorvastatin (LIPITOR) 80 MG tablet TAKE ONE TABLET BY MOUTH ONCE DAILY AT 6 P.M. 03/27/16   Arnoldo Lenis, MD  furosemide (LASIX) 20 MG tablet Take 1 tablet (20 mg total) by mouth daily. 03/27/16   Arnoldo Lenis, MD  glimepiride (AMARYL) 2 MG tablet Take 1-2 mg by mouth See admin instructions. 1 mg in the morning, 2 mg at bedtime 12/12/21   [provider]  isosorbide mononitrate (IMDUR) 30 MG 24 hr tablet Take 0.5 tablets (15 mg total) by mouth daily. 12/15/17   Arnoldo Lenis, MD  losartan (COZAAR) 25 MG tablet Take 0.5 tablets (12.5 mg total) daily by mouth. 11/05/17   Lendon Colonel, NP  metoprolol tartrate (LOPRESSOR) 25 MG tablet Take 25 mg by mouth 2 (two) times daily.    [provider]  Multiple Vitamin (MULTIVITAMIN WITH MINERALS) TABS tablet Take 1 tablet by mouth daily.    [provider]  nystatin ointment (MYCOSTATIN) Apply 1 application topically 2 (two) times daily. 02/18/22   Eileen Stanford, PA-C  Omega-3 Fatty Acids (FISH OIL) 1000 MG CAPS Take 1,000 mg by mouth daily.    [provider]  potassium chloride SA (K-DUR,KLOR-CON) 20 MEQ tablet Take 1 tablet (20 mEq total) by mouth daily. NEED OV. 10/17/18   Lendon Colonel, NP    Family History Family History  Problem Relation Age of Onset   Hypertension Mother    Coronary artery disease  Mother        CABG   Hypertension Father    CAD Father        CABG   Coronary artery disease Brother        Stent    Social History Social History   Tobacco Use   Smoking status: Never   Smokeless tobacco: Never  Vaping Use   Vaping Use: Never used  Substance Use Topics   Alcohol use: No    Alcohol/week: 0.0 standard drinks of alcohol   Drug use: No     Allergies   Iodinated contrast media   Review of Systems Review of Systems Per HPI  Physical Exam Triage Vital Signs ED Triage Vitals  Enc Vitals Group     BP 06/22/22 0823 (!) 185/67     Pulse Rate 06/22/22 0823 (!) 57     Resp 06/22/22 0823 14     Temp 06/22/22 0823 97.7 F (36.5 C)  Temp Source 06/22/22 0823 Oral     SpO2 06/22/22 0823 97 %     Weight --      Height --      Head Circumference --      Peak Flow --      Pain Score 06/22/22 0825 7     Pain Loc --      Pain Edu? --      Excl. in [place]? --    No data found.  Updated Vital Signs BP (!) 185/67 (BP Location: Right Arm)   Pulse (!) 57   Temp 97.7 F (36.5 C) (Oral)   Resp 14   SpO2 97%   Visual Acuity Right Eye Distance:   Left Eye Distance:   Bilateral Distance:    Right Eye Near:   Left Eye Near:    Bilateral Near:     Physical Exam Vitals reviewed.  Constitutional:      General: She is not in acute distress.    Appearance: She is well-developed.  HENT:     Head: Normocephalic.  Eyes:     Extraocular Movements: Extraocular movements intact.     Pupils: Pupils are equal, round, and reactive to light.  Cardiovascular:     Rate and Rhythm: Regular rhythm.     Heart sounds: Normal heart sounds.  Pulmonary:     Effort: Pulmonary effort is normal.     Breath sounds: Normal breath sounds.  Abdominal:     General: Bowel sounds are normal. There is no distension.     Palpations: Abdomen is soft.     Tenderness: There is no abdominal tenderness. There is left CVA tenderness. There is no right CVA tenderness, guarding or  rebound.  Genitourinary:    Vagina: Normal. No vaginal discharge.  Musculoskeletal:     Cervical back: Normal range of motion.  Skin:    General: Skin is warm and dry.     Findings: No erythema or rash.  Neurological:     Mental Status: She is alert and oriented to person, place, and time.     Cranial Nerves: No cranial nerve deficit.  Psychiatric:        Behavior: Behavior normal.      UC Treatments / Results  Labs (all labs ordered are listed, but only abnormal results are displayed) Labs Reviewed  POCT URINALYSIS DIP (MANUAL ENTRY) - Abnormal; Notable for the following components:      Result Value   Color, UA orange (*)    Clarity, UA cloudy (*)    Glucose, UA =100 (*)    Bilirubin, UA small (*)    Spec Grav, UA >=1.030 (*)    Blood, UA large (*)    Protein Ur, POC >=300 (*)    Urobilinogen, UA 2.0 (*)    Nitrite, UA Positive (*)    Leukocytes, UA Large (3+) (*)    All other components within normal limits  URINE CULTURE    EKG   Radiology No results found.  Procedures Procedures (including critical care time)  Medications Ordered in UC Medications - No data to display  Initial Impression / Assessment and Plan / UC Course  I have reviewed the triage vital signs and the nursing notes.  Pertinent labs & imaging results that were available during my care of the patient were reviewed by me and considered in my medical decision making (see chart for details).  Patient presents for urinary symptoms that have been present for the past week.  Urinalysis is positive for protein, glucose, blood, nitrates, and leukocytes.  On exam, she has moderate left CVA tenderness.  The symptoms are consistent with pyelonephritis.  Her vital signs are stable, she is in no acute distress.  We will start patient on Cipro for her symptoms.  Urine culture is pending.  Supportive care recommendations were provided to the patient.  Discussion with patient regarding strict indications of  when to go to the emergency department.  She was advised to follow-up with her primary care physician within the next 7 to 10 days for reevaluation. Final Clinical Impressions(s) / UC Diagnoses   Final diagnoses:  Urinary tract infection symptoms     Discharge Instructions      Your urinalysis shows that you do have a urinary tract infection.  There is concerned that your symptoms have progressed into a kidney infection.  A urine culture is pending.  You will be contacted once the results are received. -Take medications as prescribed. -Increase fluids.  You should be drinking at least 10-12 8 ounce glasses of water daily while symptoms persist. -Tylenol for pain, fever, or general discomfort. -Develop a toileting schedule that will allow you to toilet at least every 2 hours. -Avoid caffeine to include tea, soda, and coffee. -Follow-up in the emergency department if you develop fever, chills, worsening abdominal pain, worsening low back pain, increased fatigue or other concerns.      ED Prescriptions     Medication Sig Dispense Auth. Provider   ciprofloxacin (CIPRO) 500 MG tablet Take 1 tablet (500 mg total) by mouth 2 (two) times daily. 14 tablet Verline Kong-Warren, Alda Lea, NP      PDMP not reviewed this encounter.   Tish Men, NP 06/22/22 0858    Tish Men, NP 06/22/22 [phone]

## 2022-06-22 NOTE — Discharge Instructions (Signed)
Your urinalysis shows that you do have a urinary tract infection.  There is concerned that your symptoms have progressed into a kidney infection.  A urine culture is pending.  You will be contacted once the results are received. -Take medications as prescribed. -Increase fluids.  You should be drinking at least 10-12 8 ounce glasses of water daily while symptoms persist. -Tylenol for pain, fever, or general discomfort. -Develop a toileting schedule that will allow you to toilet at least every 2 hours. -Avoid caffeine to include tea, soda, and coffee. -Follow-up in the emergency department if you develop fever, chills, worsening abdominal pain, worsening low back pain, increased fatigue or other concerns.

## 2022-06-24 LAB — URINE CULTURE: Culture: >=100000 — AB

## 2022-07-02 DIAGNOSIS — Z6841 Body Mass Index (BMI) 40.0 and over, adult: Secondary | ICD-10-CM | POA: Diagnosis not present

## 2022-07-02 DIAGNOSIS — E119 Type 2 diabetes mellitus without complications: Secondary | ICD-10-CM | POA: Diagnosis not present

## 2022-07-02 DIAGNOSIS — Z1231 Encounter for screening mammogram for malignant neoplasm of breast: Secondary | ICD-10-CM | POA: Diagnosis not present

## 2022-07-02 DIAGNOSIS — I10 Essential (primary) hypertension: Secondary | ICD-10-CM | POA: Diagnosis not present

## 2022-07-02 DIAGNOSIS — I5032 Chronic diastolic (congestive) heart failure: Secondary | ICD-10-CM | POA: Diagnosis not present

## 2022-07-27 DIAGNOSIS — H52209 Unspecified astigmatism, unspecified eye: Secondary | ICD-10-CM | POA: Diagnosis not present

## 2022-07-27 DIAGNOSIS — H5203 Hypermetropia, bilateral: Secondary | ICD-10-CM | POA: Diagnosis not present

## 2022-07-27 DIAGNOSIS — H524 Presbyopia: Secondary | ICD-10-CM | POA: Diagnosis not present

## 2022-08-04 ENCOUNTER — Encounter: Payer: Self-pay | Admitting: Cardiology

## 2022-08-04 ENCOUNTER — Ambulatory Visit: Payer: Medicare HMO | Admitting: Cardiology

## 2022-08-04 VITALS — BP 130/72 | HR 64 | Ht 63.0 in | Wt 227.0 lb

## 2022-08-04 DIAGNOSIS — E782 Mixed hyperlipidemia: Secondary | ICD-10-CM

## 2022-08-04 DIAGNOSIS — I2510 Atherosclerotic heart disease of native coronary artery without angina pectoris: Secondary | ICD-10-CM | POA: Diagnosis not present

## 2022-08-04 DIAGNOSIS — Z952 Presence of prosthetic heart valve: Secondary | ICD-10-CM | POA: Diagnosis not present

## 2022-08-04 DIAGNOSIS — I10 Essential (primary) hypertension: Secondary | ICD-10-CM | POA: Diagnosis not present

## 2022-08-04 MED ORDER — METOPROLOL TARTRATE 25 MG PO TABS: 25.0000 mg | ORAL_TABLET | Freq: Two times a day (BID) | ORAL | 6 refills | Status: DC

## 2022-08-04 NOTE — Progress Notes (Signed)
Clinical Summary Karina Green is a 66 y.o.female seen today for follow up of the following medical problems.     1. CAD - hx of CABG Nov 2015 (LIMA-LAD,SVG-OM) - 10/2014 echo LVEF 60-65%, abnormal diastolic function indeterminant grade,       09/2017 echo LVEF 60-65%, no WMAs, grade I diastolic dysfunction, mild AS 10/2017 nuclear stress: low risk, gut artifact vs small basal inferolateral defect with mild reversibility        09/2021 nuclear stress; no clear ischemia, likely variable soft tissue attenuation anterolateral wall.  - Jan 2023 cath pre TAVR:     Prox RCA lesion is 40% stenosed.   Mid RCA lesion is 40% stenosed.   Dist LM lesion is 70% stenosed.   Ost Cx lesion is 50% stenosed.   Ost LAD to Prox LAD lesion is 75% stenosed.   Origin to Prox Graft lesion is 100% stenosed.  - no chest pains, no SOB/DOE  -compliant with meds   2. HTN She is complisnt with meds     3. Hyperlipidemia - compliant with statin, labs followed by pcp - not able to afford zetia. Side effects on crestor  - 01/2022 TC 134 TG 109 HDL 28 LDL 84   4. Aortic stenosis - 01/2022 TAVR - 02/2022 echo LVEF 60-65%, no WMAs, grad II dd. TAVR valve normal function       5. Palpitations - 06/2019 event monitor was benign - reported side effects on metoprolo,, stopped taking.  - taking metoprolol '25mg'$  just once a day.  - symptoms improved with weaning of caffeine - rare infrequent palpitations.    Past Medical History:  Diagnosis Date   Arthritis    Back pain    CAD (coronary artery disease), native coronary artery-70% LM stenosis 10/25/2014   Cough last 30 yrs    nonproductive, no fevers   DM type 2 (diabetes mellitus, type 2) ([place]) 10/25/2014   GERD (gastroesophageal reflux disease)    Gout    Hyperlipidemia    Hypertension    S/P TAVR (transcatheter aortic valve replacement) 02/10/2022   s/p TAVR with 29 mm Medtronic Evolut Fx via the TF approach by Dr. Angelena Form & Dr. Cyndia Bent   Severe  aortic stenosis      Allergies  Allergen Reactions   Iodinated Contrast Media Rash    Very large itchy rash within 24 hours of heart cath      Current Outpatient Medications  Medication Sig Dispense Refill   allopurinol (ZYLOPRIM) 100 MG tablet Take 100 mg by mouth daily.     amoxicillin (AMOXIL) 500 MG tablet Take 4 tablets (2,000 mg total) by mouth as directed. 1 hour prior to dental work including cleanings 12 tablet 12   aspirin EC 81 MG tablet Take 81 mg by mouth daily.     atorvastatin (LIPITOR) 80 MG tablet TAKE ONE TABLET BY MOUTH ONCE DAILY AT 6 P.M. 90 tablet 01   ciprofloxacin (CIPRO) 500 MG tablet Take 1 tablet (500 mg total) by mouth 2 (two) times daily. 14 tablet 0   furosemide (LASIX) 20 MG tablet Take 1 tablet (20 mg total) by mouth daily. 90 tablet 1   glimepiride (AMARYL) 2 MG tablet Take 1-2 mg by mouth See admin instructions. 1 mg in the morning, 2 mg at bedtime     isosorbide mononitrate (IMDUR) 30 MG 24 hr tablet Take 0.5 tablets (15 mg total) by mouth daily. 45 tablet 3   losartan (COZAAR) 25 MG  tablet Take 0.5 tablets (12.5 mg total) daily by mouth. 45 tablet 3   metoprolol tartrate (LOPRESSOR) 25 MG tablet Take 25 mg by mouth 2 (two) times daily.     Multiple Vitamin (MULTIVITAMIN WITH MINERALS) TABS tablet Take 1 tablet by mouth daily.     nystatin ointment (MYCOSTATIN) Apply 1 application topically 2 (two) times daily. 30 g 3   Omega-3 Fatty Acids (FISH OIL) 1000 MG CAPS Take 1,000 mg by mouth daily.     potassium chloride SA (K-DUR,KLOR-CON) 20 MEQ tablet Take 1 tablet (20 mEq total) by mouth daily. NEED OV. 90 tablet 0   Current Facility-Administered Medications  Medication Dose Route Frequency Provider Last Rate Last Admin   sodium chloride flush (NS) 0.9 % injection 3 mL  3 mL Intravenous Q12H Arnoldo Lenis, MD         Past Surgical History:  Procedure Laterality Date   ABDOMINAL HYSTERECTOMY     complete   APPENDECTOMY     bladder tack      CARDIAC CATHETERIZATION  10/25/14   70% LM stensis, normal EF   CHOLECYSTECTOMY     CORONARY ARTERY BYPASS GRAFT N/A 10/29/2014   Procedure: CORONARY ARTERY BYPASS GRAFTING (CABG) TIMES TWO USING LEFT INTERNAL MAMMARY ARTERY AND RIGHT LEG GREATER SAPHENOUS VEIN HARVESTED ENDOSCOPICALLY.;  Surgeon: Ivin Poot, MD;  Location: [facility];  Service: Open Heart Surgery;  Laterality: N/A;   HERNIA REPAIR     INTRAOPERATIVE TRANSESOPHAGEAL ECHOCARDIOGRAM N/A 10/29/2014   Procedure: INTRAOPERATIVE TRANSESOPHAGEAL ECHOCARDIOGRAM;  Surgeon: Ivin Poot, MD;  Location: [facility];  Service: Open Heart Surgery;  Laterality: N/A;   INTRAOPERATIVE TRANSTHORACIC ECHOCARDIOGRAM N/A 02/10/2022   Procedure: INTRAOPERATIVE TRANSTHORACIC ECHOCARDIOGRAM;  Surgeon: Burnell Blanks, MD;  Location: [facility];  Service: Open Heart Surgery;  Laterality: N/A;   JOINT REPLACEMENT Right 2009   hip total   LEFT HEART CATHETERIZATION WITH CORONARY ANGIOGRAM N/A 10/26/2014   Procedure: LEFT HEART CATHETERIZATION WITH CORONARY ANGIOGRAM;  Surgeon: Troy Sine, MD;  Location: [facility] CATH LAB;  Service: Cardiovascular;  Laterality: N/A;   RIGHT/LEFT HEART CATH AND CORONARY/GRAFT ANGIOGRAPHY N/A 12/29/2021   Procedure: RIGHT/LEFT HEART CATH AND CORONARY/GRAFT ANGIOGRAPHY;  Surgeon: Sherren Mocha, MD;  Location: [facility] CV LAB;  Service: Cardiovascular;  Laterality: N/A;   TONSILLECTOMY     TOTAL HIP ARTHROPLASTY     TOTAL HIP ARTHROPLASTY Left 05/07/2016   Procedure: TOTAL HIP ARTHROPLASTY ANTERIOR APPROACH;  Surgeon: Rod Can, MD;  Location: [facility];  Service: Orthopedics;  Laterality: Left;   TRANSCATHETER AORTIC VALVE REPLACEMENT, TRANSFEMORAL N/A 02/10/2022   Procedure: TRANSCATHETER AORTIC VALVE REPLACEMENT, TRANSFEMORAL USING A 29MM MEDTRONIC EVOLUT FX TRANSCATHETER AORTIC VALVE.;  Surgeon: Burnell Blanks, MD;  Location: [facility];  Service: Open Heart Surgery;  Laterality: N/A;     Allergies  Allergen  Reactions   Iodinated Contrast Media Rash    Very large itchy rash within 24 hours of heart cath       Family History  Problem Relation Age of Onset   Hypertension Mother    Coronary artery disease Mother        CABG   Hypertension Father    CAD Father        CABG   Coronary artery disease Brother        Stent     Social History Ms. Langwell reports that she has never smoked. She has never used smokeless tobacco. Ms. Womac reports no history of alcohol use.  Review of Systems CONSTITUTIONAL: No weight loss, fever, chills, weakness or fatigue.  HEENT: Eyes: No visual loss, blurred vision, double vision or yellow sclerae.No hearing loss, sneezing, congestion, runny nose or sore throat.  SKIN: No rash or itching.  CARDIOVASCULAR: per hpi RESPIRATORY: No shortness of breath, cough or sputum.  GASTROINTESTINAL: No anorexia, nausea, vomiting or diarrhea. No abdominal pain or blood.  GENITOURINARY: No burning on urination, no polyuria NEUROLOGICAL: No headache, dizziness, syncope, paralysis, ataxia, numbness or tingling in the extremities. No change in bowel or bladder control.  MUSCULOSKELETAL: No muscle, back pain, joint pain or stiffness.  LYMPHATICS: No enlarged nodes. No history of splenectomy.  PSYCHIATRIC: No history of depression or anxiety.  ENDOCRINOLOGIC: No reports of sweating, cold or heat intolerance. No polyuria or polydipsia.  Marland Kitchen   Physical Examination Today's Vitals   08/04/22 1349  BP: 130/72  Pulse: 64  SpO2: 97%  Weight: 227 lb (103 kg)  Height: '5\' 3"'$  (1.6 m)   Body mass index is 40.21 kg/m.  Gen: resting comfortably, no acute distress HEENT: no scleral icterus, pupils equal round and reactive, no palptable cervical adenopathy,  CV: RRR, no m/r/g no jvd Resp: Clear to auscultation bilaterally GI: abdomen is soft, non-tender, non-distended, normal bowel sounds, no hepatosplenomegaly MSK: extremities are warm, no edema.  Skin: warm, no rash Neuro:  no  focal deficits Psych: appropriate affect   Diagnostic Studies 10/2014 Cath HEMODYNAMICS:    Central Aorta: 140/66   Left Ventricle: 140/18   ANGIOGRAPHY:    The left main coronary artery was a large-caliber vessel which bifurcated into the LAD and left circumflex coronary artery.  There was a 70% distal left main stenosis prior to its bifurcation with a suggestion of a focal napkin ring like appearance. This did not improve following IC nitroglycerin administration.   The LAD was angiographically normal and gave rise to 2 major diagonal vessels and several septal perforating arteries. The vessel extended to the LV apex.    The left circumflex coronary artery was angiographically normal and gave rise to one major bifurcating obtuse marginal Paizlee Kinder.    The RCA was angiographically normal it gave rise to a  PDA and PLA vessel.    Left ventriculography revealed normal global LV contractility without focal segmental wall motion abnormalities. There was no evidence for mitral regurgitation. Ejection fraction was 60%.     Total contrast used: 85 cc Omnipaque   IMPRESSION:   Significant coronary obstructive disease with 70% distal left main stenosis.   Normal LV function.     RECOMMENDATION:   Surgical consultation for consideration for CABG.   10/2014 Echo Study Conclusions  - Left ventricle: The cavity size was normal. Wall thickness was   increased in a pattern of mild LVH. Systolic function was normal.   The estimated ejection fraction was in the range of 60% to 65%.   Diastolic function is abnormal, indeterminant grade. Wall motion   was normal; there were no regional wall motion abnormalities. - Aortic valve: Moderately calcified annulus. Trileaflet; mildly   thickened leaflets. There is aortic sclerosis without significant   stenosis. There was trivial regurgitation. Valve area (VTI): 1.79   cm^2. Valve area (Vmax): 1.82 cm^2. - Mitral valve: Mildly calcified  annulus. Mildly thickened leaflets   . - Atrial septum: No defect or patent foramen ovale was identified. - Technically adequate study.  10/2014 Carotid US Summary:  - The vertebral arteries appear patent with antegrade flow. - Findings consistent with 1- 39  percent stenosis involving the   right internal carotid artery and the left internal carotid   artery. - ICA/CCA ratio. right = 0.81. left =1.36. - Palpable pedal pulses.   05/2015 echo Study Conclusions   - Left ventricle: The cavity size was normal. Wall thickness was   increased in a pattern of moderate LVH. Systolic function was   normal. The estimated ejection fraction was in the range of 60%   to 65%. Wall motion was normal; there were no regional wall   motion abnormalities. Doppler parameters are consistent with   abnormal left ventricular relaxation (grade 1 diastolic   dysfunction). - Aortic valve: Trileaflet; moderately calcified leaflets. Left   coronary cusp mobility was restricted. There was mild stenosis.   Mean gradient (S): 16 mm Hg. Peak gradient (S): 27 mm Hg. VTI   ratio of LVOT to aortic valve: 0.48. Valve area (VTI): 1.5 cm^2. - Mitral valve: Calcified annulus. There was trivial regurgitation. - Right atrium: Central venous pressure (est): 3 mm Hg. - Atrial septum: No defect or patent foramen ovale was identified. - Tricuspid valve: There was trivial regurgitation. - Pulmonary arteries: Systolic pressure could not be accurately   estimated. - Pericardium, extracardiac: There was no pericardial effusion.   Impressions:   - Moderate LVH with LVEF 60-65% and grade 1 diastolic dysfunction.   Mild calcific aortic stenosis as outlined. Trivial tricuspid   regurgitation, unable to assess PASP.     03/2016 Carotid US Mild bilateral disease     10/2017 nuclear stress There was no ST segment deviation noted during stress. This is a low risk study. The left ventricular ejection fraction is normal  (55-65%). Small basal inferolateral defect with mild reversibility. This may represent a small prior infarct with mild ischemia, however there is significant adjacent radiotracer uptake which may affect the defect. Either finding would support low risk     09/2017 echo Study Conclusions   - Left ventricle: The cavity size was normal. Wall thickness was   increased in a pattern of mild LVH. Systolic function was normal.   The estimated ejection fraction was in the range of 60% to 65%.   Wall motion was normal; there were no regional wall motion   abnormalities. Doppler parameters are consistent with abnormal   left ventricular relaxation (grade 1 diastolic dysfunction). - Aortic valve: There was mild stenosis. Mean gradient (S): 16 mm   Hg. Mean gradient of 19 by pedhoff probe. Valve area (VTI): 1.8   cm^2. Valve area (Vmax): 1.81 cm^2. Valve area (Vmean): 1.66   cm^2. - Mitral valve: Mildly calcified annulus. Normal thickness leaflets   . - Technically adequate study.     06/2019 event monitor   7 day event monitor Min HR 49, Max HR 120, average HR 73. Min HR occurred in early AM hours presumably while sleeping No symptoms reported Telemetry tracings show normal sinus rhythm   09/2021 nuclear stress   Findings are consistent with no ischemia. The study is low risk.   No ST deviation was noted. The ECG was negative for ischemia.   LV perfusion is equivocal.  Moderate sized, mild intensity, apical to basal anterolateral defect that is partially reversible at the base and most consistent with variable soft tissue attenuation rather than ischemia.  Wall motion is normal.   Left ventricular function is normal. Nuclear stress EF: 58 %.   Low risk study with evidence of variable breast attenuation, no definite ischemia.  LVEF 58%.  Jan 2023 cath   Prox RCA lesion is 40% stenosed.   Mid RCA lesion is 40% stenosed.   Dist LM lesion is 70% stenosed.   Ost Cx lesion is 50%  stenosed.   Ost LAD to Prox LAD lesion is 75% stenosed.   Origin to Prox Graft lesion is 100% stenosed.   1.  Moderately severe 70% distal left main stenosis 2.  Severe 75% proximal LAD stenosis 3.  Moderate 50% proximal circumflex stenosis 4.  Patent RCA with mild plaquing, dominant vessel 5. Continued patency of the LIMA-LAD 6. Chronic occlusion of the SVG-OM 7.  Severe calcific aortic stenosis with peak to peak gradient of 66 mmHg. 8.  Mildly elevated right heart pressures, suspect secondary to left heart disease   Recommendations: Medical therapy for CAD, TAVR evaluation for treatment of severe aortic stenosis.   Assessment and Plan   1.Aortic stenosis - s/p TAVR, continues to do well - f/u echo showed normal AVR function - continue to monitor.    2. CAD - no recent symptoms, continue current meds  3. Hyperlipidemia - LDL mildly above goal on lipitor '80mg'$ . Did not tolerate crestor, zetia was too expensive - continue current regimen   4.HTN - at goal, continue current meds  5. Palpitations - doing well, conitnue lopressor.   F/u 6 months  Arnoldo Lenis, M.D.,

## 2022-08-04 NOTE — Patient Instructions (Signed)
Medication Instructions:  Lopressor '25mg'$  twice a day refilled today. Continue all other medications.    Labwork: none  Testing/Procedures: none  Follow-Up: 6 months   Any Other Special Instructions Will Be Listed Below (If Applicable).  If you need a refill on your cardiac medications before your next appointment, please call your pharmacy.

## 2022-08-12 DIAGNOSIS — C44719 Basal cell carcinoma of skin of left lower limb, including hip: Secondary | ICD-10-CM | POA: Diagnosis not present

## 2022-08-12 DIAGNOSIS — L821 Other seborrheic keratosis: Secondary | ICD-10-CM | POA: Diagnosis not present

## 2022-08-12 DIAGNOSIS — D485 Neoplasm of uncertain behavior of skin: Secondary | ICD-10-CM | POA: Diagnosis not present

## 2022-08-13 ENCOUNTER — Other Ambulatory Visit: Payer: Self-pay | Admitting: *Deleted

## 2022-08-13 NOTE — Patient Outreach (Signed)
  Care Coordination   Initial Visit Note   08/13/2022 Name: Karina Green MRN: [id number] DOB: February 15, 1956  Karina Green is a 66 y.o. year old female who sees Jermyn, Pharmacologist for primary care. I spoke with  Raeanne Gathers by phone today  What matters to the patients health and wellness today?  Denies concern for health, declines participation.    SDOH assessments and interventions completed:  No     Care Coordination Interventions Activated:  No  Care Coordination Interventions:  No, not indicated   Follow up plan: No further intervention required.   Encounter Outcome:  Pt. Refused   Valente David, RN, MSN, [facility] Care Management Coordinator [phone]

## 2022-11-20 DIAGNOSIS — C44719 Basal cell carcinoma of skin of left lower limb, including hip: Secondary | ICD-10-CM | POA: Diagnosis not present

## 2022-12-01 DIAGNOSIS — Z6841 Body Mass Index (BMI) 40.0 and over, adult: Secondary | ICD-10-CM | POA: Diagnosis not present

## 2022-12-01 DIAGNOSIS — E7849 Other hyperlipidemia: Secondary | ICD-10-CM | POA: Diagnosis not present

## 2022-12-01 DIAGNOSIS — M109 Gout, unspecified: Secondary | ICD-10-CM | POA: Diagnosis not present

## 2022-12-01 DIAGNOSIS — Z0001 Encounter for general adult medical examination with abnormal findings: Secondary | ICD-10-CM | POA: Diagnosis not present

## 2022-12-01 DIAGNOSIS — Z23 Encounter for immunization: Secondary | ICD-10-CM | POA: Diagnosis not present

## 2022-12-01 DIAGNOSIS — R69 Illness, unspecified: Secondary | ICD-10-CM | POA: Diagnosis not present

## 2022-12-01 DIAGNOSIS — K219 Gastro-esophageal reflux disease without esophagitis: Secondary | ICD-10-CM | POA: Diagnosis not present

## 2022-12-01 DIAGNOSIS — I10 Essential (primary) hypertension: Secondary | ICD-10-CM | POA: Diagnosis not present

## 2022-12-01 DIAGNOSIS — J069 Acute upper respiratory infection, unspecified: Secondary | ICD-10-CM | POA: Diagnosis not present

## 2022-12-01 DIAGNOSIS — E782 Mixed hyperlipidemia: Secondary | ICD-10-CM | POA: Diagnosis not present

## 2022-12-01 DIAGNOSIS — Z1331 Encounter for screening for depression: Secondary | ICD-10-CM | POA: Diagnosis not present

## 2022-12-01 DIAGNOSIS — E119 Type 2 diabetes mellitus without complications: Secondary | ICD-10-CM | POA: Diagnosis not present

## 2022-12-07 DIAGNOSIS — M25519 Pain in unspecified shoulder: Secondary | ICD-10-CM | POA: Diagnosis not present

## 2022-12-07 DIAGNOSIS — R52 Pain, unspecified: Secondary | ICD-10-CM | POA: Diagnosis not present

## 2022-12-07 DIAGNOSIS — Z743 Need for continuous supervision: Secondary | ICD-10-CM | POA: Diagnosis not present

## 2022-12-07 DIAGNOSIS — R059 Cough, unspecified: Secondary | ICD-10-CM | POA: Diagnosis not present

## 2022-12-07 DIAGNOSIS — R079 Chest pain, unspecified: Secondary | ICD-10-CM | POA: Diagnosis not present

## 2022-12-08 ENCOUNTER — Other Ambulatory Visit (HOSPITAL_COMMUNITY): Payer: Self-pay | Admitting: Internal Medicine

## 2022-12-08 DIAGNOSIS — Z1231 Encounter for screening mammogram for malignant neoplasm of breast: Secondary | ICD-10-CM

## 2022-12-09 ENCOUNTER — Other Ambulatory Visit (HOSPITAL_COMMUNITY): Payer: Self-pay | Admitting: Internal Medicine

## 2022-12-09 DIAGNOSIS — N6320 Unspecified lump in the left breast, unspecified quadrant: Secondary | ICD-10-CM

## 2022-12-21 DIAGNOSIS — M109 Gout, unspecified: Secondary | ICD-10-CM | POA: Diagnosis not present

## 2022-12-29 ENCOUNTER — Ambulatory Visit (HOSPITAL_COMMUNITY): Payer: Medicare HMO

## 2022-12-29 ENCOUNTER — Inpatient Hospital Stay (HOSPITAL_COMMUNITY): Admission: RE | Admit: 2022-12-29 | Payer: Medicare HMO | Source: Ambulatory Visit

## 2022-12-29 ENCOUNTER — Ambulatory Visit (HOSPITAL_COMMUNITY): Admission: RE | Admit: 2022-12-29 | Payer: Medicare HMO | Source: Ambulatory Visit

## 2023-01-19 ENCOUNTER — Ambulatory Visit (HOSPITAL_COMMUNITY)
Admission: RE | Admit: 2023-01-19 | Discharge: 2023-01-19 | Disposition: A | Discharge status: Home / Self Care | Payer: Medicare HMO | Source: Ambulatory Visit | Attending: Internal Medicine | Admitting: Internal Medicine

## 2023-01-19 ENCOUNTER — Encounter (HOSPITAL_COMMUNITY): Payer: Self-pay

## 2023-01-19 DIAGNOSIS — R92313 Mammographic fatty tissue density, bilateral breasts: Secondary | ICD-10-CM | POA: Insufficient documentation

## 2023-01-19 DIAGNOSIS — Z1239 Encounter for other screening for malignant neoplasm of breast: Secondary | ICD-10-CM | POA: Insufficient documentation

## 2023-01-19 DIAGNOSIS — N6320 Unspecified lump in the left breast, unspecified quadrant: Secondary | ICD-10-CM

## 2023-01-19 DIAGNOSIS — R928 Other abnormal and inconclusive findings on diagnostic imaging of breast: Secondary | ICD-10-CM | POA: Diagnosis not present

## 2023-02-03 ENCOUNTER — Other Ambulatory Visit: Payer: Self-pay | Admitting: *Deleted

## 2023-02-03 MED ORDER — METOPROLOL TARTRATE 25 MG PO TABS: 25.0000 mg | ORAL_TABLET | Freq: Two times a day (BID) | ORAL | 6 refills | Status: DC

## 2023-02-09 ENCOUNTER — Telehealth (HOSPITAL_COMMUNITY): Payer: Self-pay | Admitting: Radiology

## 2023-02-09 NOTE — Telephone Encounter (Signed)
Patient called to cancel echocardiogram and TAVR appointment for tomorrow. She needs to give employer prior notice. Please contact patient to reschedule.

## 2023-02-09 NOTE — Telephone Encounter (Signed)
I spoke with the pt and she needs to cancel her 1 year s/p TAVR Echo and OV on 2/23 due to not being able to miss work.  The pt is scheduled to see Dr Harl Bowie tomorrow.  I will ask if he can document NYHA class in note and address incidental finding of pulmonary nodule on pre TAVR CT.  I have rescheduled the pt's echo to 3/7.  KCCQ completed over the phone.

## 2023-02-10 ENCOUNTER — Encounter: Payer: Self-pay | Admitting: Cardiology

## 2023-02-10 ENCOUNTER — Telehealth: Payer: Self-pay | Admitting: Cardiology

## 2023-02-10 ENCOUNTER — Ambulatory Visit: Payer: Medicare HMO | Attending: Cardiology | Admitting: Cardiology

## 2023-02-10 ENCOUNTER — Other Ambulatory Visit: Payer: Self-pay | Admitting: Cardiology

## 2023-02-10 ENCOUNTER — Other Ambulatory Visit: Payer: Self-pay | Admitting: Physician Assistant

## 2023-02-10 VITALS — BP 120/65 | HR 52 | Ht 63.0 in | Wt 222.0 lb

## 2023-02-10 DIAGNOSIS — E782 Mixed hyperlipidemia: Secondary | ICD-10-CM

## 2023-02-10 DIAGNOSIS — R911 Solitary pulmonary nodule: Secondary | ICD-10-CM

## 2023-02-10 DIAGNOSIS — I2510 Atherosclerotic heart disease of native coronary artery without angina pectoris: Secondary | ICD-10-CM

## 2023-02-10 DIAGNOSIS — Z952 Presence of prosthetic heart valve: Secondary | ICD-10-CM | POA: Diagnosis not present

## 2023-02-10 DIAGNOSIS — I359 Nonrheumatic aortic valve disorder, unspecified: Secondary | ICD-10-CM

## 2023-02-10 DIAGNOSIS — R002 Palpitations: Secondary | ICD-10-CM | POA: Diagnosis not present

## 2023-02-10 DIAGNOSIS — I10 Essential (primary) hypertension: Secondary | ICD-10-CM | POA: Diagnosis not present

## 2023-02-10 NOTE — Patient Instructions (Addendum)
Medication Instructions:  Your physician recommends that you continue on your current medications as directed. Please refer to the Current Medication list given to you today.  Labwork: none  Testing/Procedures: Chest CT   Follow-Up: Your physician recommends that you schedule a follow-up appointment in: 6 months  Any Other Special Instructions Will Be Listed Below (If Applicable).  If you need a refill on your cardiac medications before your next appointment, please call your pharmacy.

## 2023-02-10 NOTE — Telephone Encounter (Signed)
Checking percert on the following patient for testing scheduled at [facility].   CT CHEST WO CONTRAST    02/18/2023

## 2023-02-10 NOTE — Progress Notes (Signed)
Clinical Summary Karina Green is a 67 y.o.female seen today for follow up of the following medical problems.     1. CAD - hx of CABG Nov 2015 (LIMA-LAD,SVG-OM) - 10/2014 echo LVEF 60-65%, abnormal diastolic function indeterminant grade,       09/2017 echo LVEF 60-65%, no WMAs, grade I diastolic dysfunction, mild AS 10/2017 nuclear stress: low risk, gut artifact vs small basal inferolateral defect with mild reversibility    09/2021 nuclear stress; no clear ischemia, likely variable soft tissue attenuation anterolateral wall.  - Jan 2023 cath pre TAVR:     Prox RCA lesion is 40% stenosed.   Mid RCA lesion is 40% stenosed.   Dist LM lesion is 70% stenosed.   Ost Cx lesion is 50% stenosed.   Ost LAD to Prox LAD lesion is 75% stenosed.   Origin to Prox Graft lesion is 100% stenosed.   - no chest pains, no SOB/DOE - compliant with meds   2. HTN She is complisnt with meds     3. Hyperlipidemia - compliant with statin, labs followed by pcp - not able to afford zetia. Side effects on crestor   - 01/2022 TC 134 TG 109 HDL 28 LDL 84   4. Aortic stenosis - 01/2022 TAVR - 02/2022 echo LVEF 60-65%, no WMAs, grad II dd. TAVR valve normal function NYHA I, no significant symptoms     5. Palpitations - 06/2019 event monitor was benign - reported side effects on metoprolo,, stopped taking.  - taking metoprolol 48m just once a day.  - symptoms improved with weaning of caffeine - rare infrequent palpitations.   - no recent symptoms.    6. Lung nodule - Jan 2023 CTA 9 mm upper lung left lung nodule, needs noncontrast CT - needs repeat noncontrast CT  [employer] Past Medical History:  Diagnosis Date   Arthritis    Back pain    CAD (coronary artery disease), native coronary artery-70% LM stenosis 10/25/2014   Cough last 30 yrs    nonproductive, no fevers   DM type 2 (diabetes mellitus, type 2) [place] 10/25[place]2014   GERD (gastroesophageal reflux disease)    Gout     Hyperlipidemia    Hypertension    S/P TAVR (transcatheter aortic valve replacement) 02/10/2022   s/p TAVR with 29 mm Medtronic Evolut Fx via the TF approach by Karina Green& Dr. BCyndia Bent  Severe aortic stenosis      Allergies  Allergen Reactions   Iodinated Contrast Media Rash    Very large itchy rash within 24 hours of heart cath      Current Outpatient Medications  Medication Sig Dispense Refill   allopurinol (ZYLOPRIM) 100 MG tablet Take 100 mg by mouth daily.     amoxicillin (AMOXIL) 500 MG tablet Take 4 tablets (2,000 mg total) by mouth as directed. 1 hour prior to dental work including cleanings 12 tablet 12   aspirin EC 81 MG tablet Take 81 mg by mouth daily.     atorvastatin (LIPITOR) 80 MG tablet TAKE ONE TABLET BY MOUTH ONCE DAILY AT 6 P.M. 90 tablet 01   furosemide (LASIX) 20 MG tablet Take 1 tablet (20 mg total) by mouth daily. 90 tablet 1   glimepiride (AMARYL) 2 MG tablet Take 1-2 mg by mouth See admin instructions. 1 mg in the morning, 2 mg at bedtime     isosorbide mononitrate (IMDUR) 30 MG 24 hr tablet Take 0.5 tablets (15 mg total)  by mouth daily. 45 tablet 3   losartan (COZAAR) 25 MG tablet Take 0.5 tablets (12.5 mg total) daily by mouth. 45 tablet 3   metoprolol tartrate (LOPRESSOR) 25 MG tablet Take 1 tablet (25 mg total) by mouth 2 (two) times daily. 60 tablet 6   Multiple Vitamin (MULTIVITAMIN WITH MINERALS) TABS tablet Take 1 tablet by mouth daily.     Omega-3 Fatty Acids (FISH OIL) 1000 MG CAPS Take 1,000 mg by mouth daily.     potassium chloride SA (K-DUR,KLOR-CON) 20 MEQ tablet Take 1 tablet (20 mEq total) by mouth daily. NEED OV. 90 tablet 0   Current Facility-Administered Medications  Medication Dose Route Frequency Provider Last Rate Last Admin   sodium chloride flush (NS) 0.9 % injection 3 mL  3 mL Intravenous Q12H Arnoldo Lenis, MD         Past Surgical History:  Procedure Laterality Date   ABDOMINAL HYSTERECTOMY     complete   APPENDECTOMY      bladder tack     CARDIAC CATHETERIZATION  10/25/14   70% LM stensis, normal EF   CHOLECYSTECTOMY     CORONARY ARTERY BYPASS GRAFT N/A 10/29/2014   Procedure: CORONARY ARTERY BYPASS GRAFTING (CABG) TIMES TWO USING LEFT INTERNAL MAMMARY ARTERY AND RIGHT LEG GREATER SAPHENOUS VEIN HARVESTED ENDOSCOPICALLY.;  Surgeon: Ivin Poot, MD;  Location: [facility];  Service: Open Heart Surgery;  Laterality: N/A;   HERNIA REPAIR     INTRAOPERATIVE TRANSESOPHAGEAL ECHOCARDIOGRAM N/A 10/29/2014   Procedure: INTRAOPERATIVE TRANSESOPHAGEAL ECHOCARDIOGRAM;  Surgeon: Ivin Poot, MD;  Location: [facility];  Service: Open Heart Surgery;  Laterality: N/A;   INTRAOPERATIVE TRANSTHORACIC ECHOCARDIOGRAM N/A 02/10/2022   Procedure: INTRAOPERATIVE TRANSTHORACIC ECHOCARDIOGRAM;  Surgeon: Burnell Blanks, MD;  Location: [facility];  Service: Open Heart Surgery;  Laterality: N/A;   JOINT REPLACEMENT Right 2009   hip total   LEFT HEART CATHETERIZATION WITH CORONARY ANGIOGRAM N/A 10/26/2014   Procedure: LEFT HEART CATHETERIZATION WITH CORONARY ANGIOGRAM;  Surgeon: Troy Sine, MD;  Location: [facility] CATH LAB;  Service: Cardiovascular;  Laterality: N/A;   RIGHT/LEFT HEART CATH AND CORONARY/GRAFT ANGIOGRAPHY N/A 12/29/2021   Procedure: RIGHT/LEFT HEART CATH AND CORONARY/GRAFT ANGIOGRAPHY;  Surgeon: Sherren Mocha, MD;  Location: [facility];  Service: Cardiovascular;  Laterality: N/A;   TONSILLECTOMY     TOTAL HIP ARTHROPLASTY     TOTAL HIP ARTHROPLASTY Left 05/07/2016   Procedure: TOTAL HIP ARTHROPLASTY ANTERIOR APPROACH;  Surgeon: Rod Can, MD;  Location: [facility];  Service: Orthopedics;  Laterality: Left;   TRANSCATHETER AORTIC VALVE REPLACEMENT, TRANSFEMORAL N/A 02/10/2022   Procedure: TRANSCATHETER AORTIC VALVE REPLACEMENT, TRANSFEMORAL USING A 29MM MEDTRONIC EVOLUT FX TRANSCATHETER AORTIC VALVE.;  Surgeon: Burnell Blanks, MD;  Location: [place];  Service: Open Heart Surgery;  Laterality: N/A;      Allergies  Allergen Reactions   Iodinated Contrast Media Rash    Very large itchy rash within 24 hours of heart cath       Family History  Problem Relation Age of Onset   Hypertension Mother    Coronary artery disease Mother        CABG   Hypertension Father    CAD Father        CABG   Coronary artery disease Brother        Stent     Social History Karina Green reports that she has never smoked. She has never used smokeless tobacco. Karina Green reports no history of alcohol use.  Review of Systems CONSTITUTIONAL: No weight loss, fever, chills, weakness or fatigue.  HEENT: Eyes: No visual loss, blurred vision, double vision or yellow sclerae.No hearing loss, sneezing, congestion, runny nose or sore throat.  SKIN: No rash or itching.  CARDIOVASCULAR: per hpi RESPIRATORY: No shortness of breath, cough or sputum.  GASTROINTESTINAL: No anorexia, nausea, vomiting or diarrhea. No abdominal pain or blood.  GENITOURINARY: No burning on urination, no polyuria NEUROLOGICAL: No headache, dizziness, syncope, paralysis, ataxia, numbness or tingling in the extremities. No change in bowel or bladder control.  MUSCULOSKELETAL: No muscle, back pain, joint pain or stiffness.  LYMPHATICS: No enlarged nodes. No history of splenectomy.  PSYCHIATRIC: No history of depression or anxiety.  ENDOCRINOLOGIC: No reports of sweating, cold or heat intolerance. No polyuria or polydipsia.  Marland Kitchen   Physical Examination Today's Vitals   02/10/23 1006 02/10/23 1040  BP: 138/78 120/65  Pulse: (!) 52   SpO2: 97%   Weight: 222 lb (100.7 kg)   Height: 5' 3"$  (1.6 m)    Body mass index is 39.33 kg/m.  Gen: resting comfortably, no acute distress HEENT: no scleral icterus, pupils equal round and reactive, no palptable cervical adenopathy,  CV: RRR, no m/rg, no jvd Resp: Clear to auscultation bilaterally GI: abdomen is soft, non-tender, non-distended, normal bowel sounds, no hepatosplenomegaly MSK:  extremities are warm, no edema.  Skin: warm, no rash Neuro:  no focal deficits Psych: appropriate affect   Diagnostic Studies  10/2014 Cath HEMODYNAMICS:    Central Aorta: 140/66   Left Ventricle: 140/18   ANGIOGRAPHY:    The left main coronary artery was a large-caliber vessel which bifurcated into the LAD and left circumflex coronary artery.  There was a 70% distal left main stenosis prior to its bifurcation with a suggestion of a focal napkin ring like appearance. This did not improve following IC nitroglycerin administration.   The LAD was angiographically normal and gave rise to 2 major diagonal vessels and several septal perforating arteries. The vessel extended to the LV apex.    The left circumflex coronary artery was angiographically normal and gave rise to one major bifurcating obtuse marginal Yared Barefoot.    The RCA was angiographically normal it gave rise to a  PDA and PLA vessel.    Left ventriculography revealed normal global LV contractility without focal segmental wall motion abnormalities. There was no evidence for mitral regurgitation. Ejection fraction was 60%.     Total contrast used: 85 cc Omnipaque   IMPRESSION:   Significant coronary obstructive disease with 70% distal left main stenosis.   Normal LV function.     RECOMMENDATION:   Surgical consultation for consideration for CABG.   10/2014 Echo Study Conclusions  - Left ventricle: The cavity size was normal. Wall thickness was   increased in a pattern of mild LVH. Systolic function was normal.   The estimated ejection fraction was in the range of 60% to 65%.   Diastolic function is abnormal, indeterminant grade. Wall motion   was normal; there were no regional wall motion abnormalities. - Aortic valve: Moderately calcified annulus. Trileaflet; mildly   thickened leaflets. There is aortic sclerosis without significant   stenosis. There was trivial regurgitation. Valve area (VTI): 1.79   cm^2.  Valve area (Vmax): 1.82 cm^2. - Mitral valve: Mildly calcified annulus. Mildly thickened leaflets   . - Atrial septum: No defect or patent foramen ovale was identified. - Technically adequate study.  10/2014 Carotid US Summary:  - The vertebral arteries appear patent  with antegrade flow. - Findings consistent with 1- 39 percent stenosis involving the   right internal carotid artery and the left internal carotid   artery. - ICA/CCA ratio. right = 0.81. left =1.36. - Palpable pedal pulses.   05/2015 echo Study Conclusions   - Left ventricle: The cavity size was normal. Wall thickness was   increased in a pattern of moderate LVH. Systolic function was   normal. The estimated ejection fraction was in the range of 60%   to 65%. Wall motion was normal; there were no regional wall   motion abnormalities. Doppler parameters are consistent with   abnormal left ventricular relaxation (grade 1 diastolic   dysfunction). - Aortic valve: Trileaflet; moderately calcified leaflets. Left   coronary cusp mobility was restricted. There was mild stenosis.   Mean gradient (S): 16 mm Hg. Peak gradient (S): 27 mm Hg. VTI   ratio of LVOT to aortic valve: 0.48. Valve area (VTI): 1.5 cm^2. - Mitral valve: Calcified annulus. There was trivial regurgitation. - Right atrium: Central venous pressure (est): 3 mm Hg. - Atrial septum: No defect or patent foramen ovale was identified. - Tricuspid valve: There was trivial regurgitation. - Pulmonary arteries: Systolic pressure could not be accurately   estimated. - Pericardium, extracardiac: There was no pericardial effusion.   Impressions:   - Moderate LVH with LVEF 60-65% and grade 1 diastolic dysfunction.   Mild calcific aortic stenosis as outlined. Trivial tricuspid   regurgitation, unable to assess PASP.     03/2016 Carotid US Mild bilateral disease     10/2017 nuclear stress There was no ST segment deviation noted during stress. This is a low risk  study. The left ventricular ejection fraction is normal (55-65%). Small basal inferolateral defect with mild reversibility. This may represent a small prior infarct with mild ischemia, however there is significant adjacent radiotracer uptake which may affect the defect. Either finding would support low risk     09/2017 echo Study Conclusions   - Left ventricle: The cavity size was normal. Wall thickness was   increased in a pattern of mild LVH. Systolic function was normal.   The estimated ejection fraction was in the range of 60% to 65%.   Wall motion was normal; there were no regional wall motion   abnormalities. Doppler parameters are consistent with abnormal   left ventricular relaxation (grade 1 diastolic dysfunction). - Aortic valve: There was mild stenosis. Mean gradient (S): 16 mm   Hg. Mean gradient of 19 by pedhoff probe. Valve area (VTI): 1.8   cm^2. Valve area (Vmax): 1.81 cm^2. Valve area (Vmean): 1.66   cm^2. - Mitral valve: Mildly calcified annulus. Normal thickness leaflets   . - Technically adequate study.     06/2019 event monitor   7 day event monitor Min HR 49, Max HR 120, average HR 73. Min HR occurred in early AM hours presumably while sleeping No symptoms reported Telemetry tracings show normal sinus rhythm   09/2021 nuclear stress   Findings are consistent with no ischemia. The study is low risk.   No ST deviation was noted. The ECG was negative for ischemia.   LV perfusion is equivocal.  Moderate sized, mild intensity, apical to basal anterolateral defect that is partially reversible at the base and most consistent with variable soft tissue attenuation rather than ischemia.  Wall motion is normal.   Left ventricular function is normal. Nuclear stress EF: 58 %.   Low risk study with evidence of variable breast attenuation,  no definite ischemia.  LVEF 58%.       Jan 2023 cath   Prox RCA lesion is 40% stenosed.   Mid RCA lesion is 40% stenosed.   Dist  LM lesion is 70% stenosed.   Ost Cx lesion is 50% stenosed.   Ost LAD to Prox LAD lesion is 75% stenosed.   Origin to Prox Graft lesion is 100% stenosed.   1.  Moderately severe 70% distal left main stenosis 2.  Severe 75% proximal LAD stenosis 3.  Moderate 50% proximal circumflex stenosis 4.  Patent RCA with mild plaquing, dominant vessel 5. Continued patency of the LIMA-LAD 6. Chronic occlusion of the SVG-OM 7.  Severe calcific aortic stenosis with peak to peak gradient of 66 mmHg. 8.  Mildly elevated right heart pressures, suspect secondary to left heart disease   Recommendations: Medical therapy for CAD, TAVR evaluation for treatment of severe aortic stenosis.       Assessment and Plan  1.Aortic stenosis - s/p TAVR, NYHA I without significant symptoms.  - f/u echo showed normal AVR function - has repeat echo in March   2. CAD - no symptoms, continue current meds   3. Hyperlipidemia - LDL mildly above goal on lipitor 60m. Did not tolerate crestor, zetia was too expensive - we will continue her current medical therapy   4.HTN - bp at goal on manual recheck, continue current meds   5. Palpitations - no symptoms, continue current meds - EKG today shows mild sinus brady low 50s, continue beta blocker.   6. Lung nodule - repeat nononctrast CT chest  F/u 6 months   JArnoldo Lenis M.D.

## 2023-02-12 ENCOUNTER — Ambulatory Visit: Payer: Medicare HMO

## 2023-02-12 ENCOUNTER — Other Ambulatory Visit (HOSPITAL_COMMUNITY): Payer: Medicare HMO

## 2023-02-18 ENCOUNTER — Ambulatory Visit (HOSPITAL_COMMUNITY): Admission: RE | Admit: 2023-02-18 | Payer: Medicare HMO | Source: Ambulatory Visit

## 2023-02-18 ENCOUNTER — Ambulatory Visit (HOSPITAL_COMMUNITY): Payer: Medicare HMO | Attending: Physician Assistant

## 2023-02-18 DIAGNOSIS — I359 Nonrheumatic aortic valve disorder, unspecified: Secondary | ICD-10-CM

## 2023-02-18 DIAGNOSIS — I10 Essential (primary) hypertension: Secondary | ICD-10-CM | POA: Diagnosis not present

## 2023-02-18 DIAGNOSIS — I2510 Atherosclerotic heart disease of native coronary artery without angina pectoris: Secondary | ICD-10-CM | POA: Diagnosis not present

## 2023-02-18 DIAGNOSIS — I5032 Chronic diastolic (congestive) heart failure: Secondary | ICD-10-CM | POA: Diagnosis not present

## 2023-02-18 LAB — ECHOCARDIOGRAM COMPLETE
AR max vel: 2.06 cm2
AV Area VTI: 2.18 cm2
AV Area mean vel: 2.19 cm2
AV Mean grad: 5.5 mmHg
AV Peak grad: 10.8 mmHg
Ao pk vel: 1.65 m/s
Area-P 1/2: 2.14 cm2
Calc EF: 63.3 %
MV M vel: 4.75 m/s
MV Peak grad: 90.1 mmHg
S' Lateral: 1.9 cm
Single Plane A2C EF: 65.4 %
Single Plane A4C EF: 61.8 %

## 2023-02-21 ENCOUNTER — Other Ambulatory Visit: Payer: Self-pay | Admitting: Cardiology

## 2023-02-22 ENCOUNTER — Telehealth: Payer: Self-pay | Admitting: *Deleted

## 2023-02-22 NOTE — Telephone Encounter (Signed)
-----   Message from Arnoldo Lenis, MD sent at 02/22/2023  9:53 AM EST ----- Echo looks good, normal heart function and normal functioning heart valves.   J BrancH MD

## 2023-02-22 NOTE — Telephone Encounter (Signed)
Laurine Blazer, LPN [id number]  [id number] PM EST Back to Top    Notified, copy to pcp.

## 2023-02-25 ENCOUNTER — Other Ambulatory Visit: Payer: Medicare HMO

## 2023-02-26 ENCOUNTER — Ambulatory Visit (HOSPITAL_COMMUNITY): Payer: Medicare HMO

## 2023-02-27 ENCOUNTER — Other Ambulatory Visit: Payer: Self-pay

## 2023-02-27 ENCOUNTER — Ambulatory Visit
Admission: EM | Admit: 2023-02-27 | Discharge: 2023-02-27 | Disposition: A | Discharge status: Home / Self Care | Payer: Medicare HMO | Attending: Family Medicine | Admitting: Family Medicine

## 2023-02-27 ENCOUNTER — Encounter: Payer: Self-pay | Admitting: Emergency Medicine

## 2023-02-27 DIAGNOSIS — J3089 Other allergic rhinitis: Secondary | ICD-10-CM | POA: Diagnosis not present

## 2023-02-27 DIAGNOSIS — J209 Acute bronchitis, unspecified: Secondary | ICD-10-CM

## 2023-02-27 MED ORDER — ALBUTEROL SULFATE HFA 108 (90 BASE) MCG/ACT IN AERS: 2.0000 | INHALATION_SPRAY | RESPIRATORY_TRACT | 0 refills | Status: AC | PRN

## 2023-02-27 MED ORDER — PROMETHAZINE-DM 6.25-15 MG/5ML PO SYRP: 5.0000 mL | ORAL_SOLUTION | Freq: Four times a day (QID) | ORAL | 0 refills | Status: DC | PRN

## 2023-02-27 MED ORDER — PREDNISONE 20 MG PO TABS: 40.0000 mg | ORAL_TABLET | Freq: Every day | ORAL | 0 refills | Status: DC

## 2023-02-27 NOTE — Discharge Instructions (Signed)
Take zyrtec and flonase daily

## 2023-02-27 NOTE — ED Triage Notes (Signed)
Pt reports cough, nasal congestion, joint pain, intermittent diarrhea x1 week. Pt reports has tried otc medication, tessalon pearls, and cough syrup with no relief.

## 2023-02-27 NOTE — ED Provider Notes (Signed)
[facility]    CSN: [id number] Arrival date & time: 02/27/23  0805      History   Chief Complaint Chief Complaint  Patient presents with   Nasal Congestion    HPI Karina Green is a 67 y.o. female.   Patient presenting today with about a week of cough, nasal congestion, sinus pressure, intermittent diarrhea, wheezing, or chest tightness.  Denies fever, chills, body aches, chest pain, shortness of breath, abdominal pain, nausea vomiting or diarrhea.  So far trying Tylenol cold and sinus, Tessalon Perles, cough syrups with minimal relief.  No known sick contacts recently.  History of seasonal allergies not currently on anything for this.    Past Medical History:  Diagnosis Date   Arthritis    Back pain    CAD (coronary artery disease), native coronary artery-70% LM stenosis 10/25/2014   Cough last 30 yrs    nonproductive, no fevers   DM type 2 (diabetes mellitus, type 2) ([place]) 10/25/2014   GERD (gastroesophageal reflux disease)    Gout    Hyperlipidemia    Hypertension    S/P TAVR (transcatheter aortic valve replacement) 02/10/2022   s/p TAVR with 29 mm Medtronic Evolut Fx via the TF approach by Dr. Angelena Form & Dr. Cyndia Bent   Severe aortic stenosis     Patient Active Problem List   Diagnosis Date Noted   S/P TAVR (transcatheter aortic valve replacement) 02/10/2022   Chronic diastolic heart failure ([place]) 06/20/2015   Severe aortic stenosis 06/20/2015   CKD (chronic kidney disease), stage III ([place]) 06/20/2015   CAD (coronary artery disease), native coronary artery-70% LM stenosis 10/26/2014   DM2 (diabetes mellitus, type 2) ([place]) 10/26/2014   Essential hypertension 10/24/2014   Hypercholesteremia 10/24/2014    Past Surgical History:  Procedure Laterality Date   ABDOMINAL HYSTERECTOMY     complete   APPENDECTOMY     bladder tack     CARDIAC CATHETERIZATION  10/25/14   70% LM stensis, normal EF   CHOLECYSTECTOMY     CORONARY ARTERY BYPASS GRAFT N/A  10/29/2014   Procedure: CORONARY ARTERY BYPASS GRAFTING (CABG) TIMES TWO USING LEFT INTERNAL MAMMARY ARTERY AND RIGHT LEG GREATER SAPHENOUS VEIN HARVESTED ENDOSCOPICALLY.;  Surgeon: Ivin Poot, MD;  Location: [place];  Service: Open Heart Surgery;  Laterality: N/A;   HERNIA REPAIR     INTRAOPERATIVE TRANSESOPHAGEAL ECHOCARDIOGRAM N/A 10/29/2014   Procedure: INTRAOPERATIVE TRANSESOPHAGEAL ECHOCARDIOGRAM;  Surgeon: Ivin Poot, MD;  Location: [place];  Service: Open Heart Surgery;  Laterality: N/A;   INTRAOPERATIVE TRANSTHORACIC ECHOCARDIOGRAM N/A 02/10/2022   Procedure: INTRAOPERATIVE TRANSTHORACIC ECHOCARDIOGRAM;  Surgeon: Burnell Blanks, MD;  Location: [place];  Service: Open Heart Surgery;  Laterality: N/A;   JOINT REPLACEMENT Right 2009   hip total   LEFT HEART CATHETERIZATION WITH CORONARY ANGIOGRAM N/A 10/26/2014   Procedure: LEFT HEART CATHETERIZATION WITH CORONARY ANGIOGRAM;  Surgeon: Troy Sine, MD;  [facility] CATH LAB;  Service: Cardiovascular;  Laterality: N/A;   RIGHT/LEFT HEART CATH AND CORONARY/GRAFT ANGIOGRAPHY N/A 12/29/2021   Procedure: RIGHT/LEFT HEART CATH AND CORONARY/GRAFT ANGIOGRAPHY;  Surgeon: Sherren Mocha, MD;  Location: [place] CV LAB;  Service: Cardiovascular;  Laterality: N/A;   TONSILLECTOMY     TOTAL HIP ARTHROPLASTY     TOTAL HIP ARTHROPLASTY Left 05/07/2016   Procedure: TOTAL HIP ARTHROPLASTY ANTERIOR APPROACH;  Surgeon: Rod Can, MD;  Location: [facility];  Service: Orthopedics;  Laterality: Left;   TRANSCATHETER AORTIC VALVE REPLACEMENT, TRANSFEMORAL N/A 02/10/2022  Procedure: TRANSCATHETER AORTIC VALVE REPLACEMENT, TRANSFEMORAL USING A 29MM MEDTRONIC EVOLUT FX TRANSCATHETER AORTIC VALVE.;  Surgeon: Burnell Blanks, MD;  Location: [place];  Service: Open Heart Surgery;  Laterality: N/A;    OB History   No obstetric history on file.      Home Medications    Prior to Admission medications   Medication Sig Start Date End Date  Taking? Authorizing Provider  albuterol (VENTOLIN HFA) 108 (90 Base) MCG/ACT inhaler Inhale 2 puffs into the lungs every 4 (four) hours as needed for wheezing or shortness of breath. 02/27/23  Yes Volney American, PA-C  predniSONE (DELTASONE) 20 MG tablet Take 2 tablets (40 mg total) by mouth daily with breakfast. 02/27/23  Yes Volney American, PA-C  promethazine-dextromethorphan (PROMETHAZINE-DM) 6.25-15 MG/5ML syrup Take 5 mLs by mouth 4 (four) times daily as needed. 02/27/23  Yes Volney American, PA-C  allopurinol (ZYLOPRIM) 100 MG tablet Take 100 mg by mouth daily. 09/16/17   [provider]  amoxicillin (AMOXIL) 500 MG tablet Take 4 tablets (2,000 mg total) by mouth as directed. 1 hour prior to dental work including cleanings 03/06/22   Eileen Stanford, PA-C  aspirin EC 81 MG tablet Take 81 mg by mouth daily.    [provider]  atorvastatin (LIPITOR) 80 MG tablet TAKE ONE TABLET BY MOUTH ONCE DAILY AT 6 P.M. 03/27/16   Arnoldo Lenis, MD  furosemide (LASIX) 20 MG tablet Take 1 tablet (20 mg total) by mouth daily. 03/27/16   Arnoldo Lenis, MD  glimepiride (AMARYL) 2 MG tablet Take 1-2 mg by mouth See admin instructions. 1 mg in the morning, 2 mg at bedtime 12/12/21   [provider]  isosorbide mononitrate (IMDUR) 30 MG 24 hr tablet Take 0.5 tablets (15 mg total) by mouth daily. 12/15/17   Arnoldo Lenis, MD  losartan (COZAAR) 25 MG tablet Take 0.5 tablets (12.5 mg total) daily by mouth. 11/05/17   Lendon Colonel, NP  metoprolol tartrate (LOPRESSOR) 25 MG tablet Take 1 tablet by mouth twice daily 02/22/23   Arnoldo Lenis, MD  Multiple Vitamin (MULTIVITAMIN WITH MINERALS) TABS tablet Take 1 tablet by mouth daily.    [provider]  Omega-3 Fatty Acids (FISH OIL) 1000 MG CAPS Take 1,000 mg by mouth daily.    [provider]  potassium chloride SA (K-DUR,KLOR-CON) 20 MEQ tablet Take 1 tablet (20 mEq total) by mouth  daily. NEED OV. 10/17/18   Lendon Colonel, NP    Family History Family History  Problem Relation Age of Onset   Hypertension Mother    Coronary artery disease Mother        CABG   Hypertension Father    CAD Father        CABG   Coronary artery disease Brother        Stent    Social History Social History   Tobacco Use   Smoking status: Never   Smokeless tobacco: Never  Vaping Use   Vaping Use: Never used  Substance Use Topics   Alcohol use: No    Alcohol/week: 0.0 standard drinks of alcohol   Drug use: No     Allergies   Iodinated contrast media   Review of Systems Review of Systems PER HPI  Physical Exam Triage Vital Signs ED Triage Vitals  Enc Vitals Group     BP 02/27/23 0820 (!) 175/79     Pulse Rate 02/27/23 0820 (!) 53  Resp 02/27/23 0820 20     Temp 02/27/23 0820 98.2 F (36.8 C)     Temp Source 02/27/23 0820 Oral     SpO2 02/27/23 0820 94 %     Weight --      Height --      Head Circumference --      Peak Flow --      Pain Score 02/27/23 0819 5     Pain Loc --      Pain Edu? --      Excl. in [place]? --    No data found.  Updated Vital Signs BP (!) 175/79 (BP Location: Right Arm)   Pulse (!) 53   Temp 98.2 F (36.8 C) (Oral)   Resp 20   SpO2 94%   Visual Acuity Right Eye Distance:   Left Eye Distance:   Bilateral Distance:    Right Eye Near:   Left Eye Near:    Bilateral Near:     Physical Exam Vitals and nursing note reviewed.  Constitutional:      Appearance: Normal appearance.  HENT:     Head: Atraumatic.     Right Ear: Tympanic membrane and external ear normal.     Left Ear: Tympanic membrane and external ear normal.     Nose: Rhinorrhea present.     Mouth/Throat:     Mouth: Mucous membranes are moist.     Pharynx: Posterior oropharyngeal erythema present.  Eyes:     Extraocular Movements: Extraocular movements intact.     Conjunctiva/sclera: Conjunctivae normal.  Cardiovascular:     Rate and Rhythm: Normal  rate and regular rhythm.     Heart sounds: Normal heart sounds.  Pulmonary:     Effort: Pulmonary effort is normal.     Breath sounds: Wheezing present. No rales.     Comments: Trace wheezes b/l Musculoskeletal:        General: Normal range of motion.     Cervical back: Normal range of motion and neck supple.  Skin:    General: Skin is warm and dry.  Neurological:     Mental Status: She is alert and oriented to person, place, and time.  Psychiatric:        Mood and Affect: Mood normal.        Thought Content: Thought content normal.    UC Treatments / Results  Labs (all labs ordered are listed, but only abnormal results are displayed) Labs Reviewed - No data to display  EKG   Radiology No results found.  Procedures Procedures (including critical care time)  Medications Ordered in UC Medications - No data to display  Initial Impression / Assessment and Plan / UC Course  I have reviewed the triage vital signs and the nursing notes.  Pertinent labs & imaging results that were available during my care of the patient were reviewed by me and considered in my medical decision making (see chart for details).     Treat with prednisone, albuterol, phenergan dm and start allergy regimen. Discussed supportive care and return precautions.   Final Clinical Impressions(s) / UC Diagnoses   Final diagnoses:  Acute bronchitis, unspecified organism  Seasonal allergic rhinitis due to other allergic trigger     Discharge Instructions      Take zyrtec and flonase daily     ED Prescriptions     Medication Sig Dispense Auth. Provider   predniSONE (DELTASONE) 20 MG tablet Take 2 tablets (40 mg total) by mouth daily with  breakfast. 10 tablet Volney American, [place]   albuterol (VENTOLIN HFA) 108 (90 Base) MCG/ACT inhaler Inhale 2 puffs into the lungs every 4 (four) hours as needed for wheezing or shortness of breath. [place], [place]    promethazine-dextromethorphan (PROMETHAZINE-DM) 6.25-15 MG/5ML syrup Take 5 mLs by mouth 4 (four) times daily as needed. 100 mL Volney American, [place]      PDMP not reviewed this encounter.   Merrie Roof [place], [place] 02/27/23 [phone]

## 2023-05-03 ENCOUNTER — Ambulatory Visit (HOSPITAL_COMMUNITY)
Admission: RE | Admit: 2023-05-03 | Discharge: 2023-05-03 | Disposition: A | Discharge status: Home / Self Care | Payer: Medicare HMO | Source: Ambulatory Visit | Attending: Cardiology | Admitting: Cardiology

## 2023-05-03 DIAGNOSIS — R911 Solitary pulmonary nodule: Secondary | ICD-10-CM | POA: Diagnosis not present

## 2023-05-03 DIAGNOSIS — R918 Other nonspecific abnormal finding of lung field: Secondary | ICD-10-CM | POA: Diagnosis not present

## 2023-05-21 DIAGNOSIS — I10 Essential (primary) hypertension: Secondary | ICD-10-CM | POA: Diagnosis not present

## 2023-05-21 DIAGNOSIS — E782 Mixed hyperlipidemia: Secondary | ICD-10-CM | POA: Diagnosis not present

## 2023-05-21 DIAGNOSIS — E1159 Type 2 diabetes mellitus with other circulatory complications: Secondary | ICD-10-CM | POA: Diagnosis not present

## 2023-05-21 DIAGNOSIS — I5032 Chronic diastolic (congestive) heart failure: Secondary | ICD-10-CM | POA: Diagnosis not present

## 2023-05-25 ENCOUNTER — Telehealth: Payer: Self-pay | Admitting: *Deleted

## 2023-05-25 NOTE — Telephone Encounter (Signed)
Lesle Chris, LPN 12/26/1094  0:45 PM EDT Back to Top    Notified, copy to pcp.

## 2023-05-25 NOTE — Telephone Encounter (Signed)
-----   Message from Antoine Poche, MD sent at 05/24/2023 10:41 AM EDT ----- CT chest looks fine, no significnat change in small abnormal area in the lung, has been stable for several years and at this point no additional CTs are neccesary  Dominga Ferry MD

## 2023-06-09 ENCOUNTER — Other Ambulatory Visit: Payer: Self-pay

## 2023-06-09 ENCOUNTER — Encounter: Payer: Self-pay | Admitting: Emergency Medicine

## 2023-06-09 ENCOUNTER — Ambulatory Visit
Admission: EM | Admit: 2023-06-09 | Discharge: 2023-06-09 | Disposition: A | Discharge status: Home / Self Care | Payer: Medicare HMO | Attending: Family Medicine | Admitting: Family Medicine

## 2023-06-09 DIAGNOSIS — H66002 Acute suppurative otitis media without spontaneous rupture of ear drum, left ear: Secondary | ICD-10-CM | POA: Diagnosis not present

## 2023-06-09 MED ORDER — AMOXICILLIN 875 MG PO TABS: 875.0000 mg | ORAL_TABLET | Freq: Two times a day (BID) | ORAL | 0 refills | Status: AC

## 2023-06-09 NOTE — ED Triage Notes (Signed)
Pt reports nasal congestion, sore throat, left ear pain x3 days.

## 2023-06-10 NOTE — ED Provider Notes (Signed)
[facility]   [id number] 06/09/23 Arrival Time: 1611  ASSESSMENT & PLAN:  1. Non-recurrent acute suppurative otitis media of left ear without spontaneous rupture of tympanic membrane    Meds ordered this encounter  Medications   amoxicillin (AMOXIL) 875 MG tablet    Sig: Take 1 tablet (875 mg total) by mouth 2 (two) times daily for 7 days.    Dispense:  14 tablet    Refill:  0  OTC analgesics if needed.   Follow-up Information     [facility].   Specialty: Urgent Care Why: As needed. Contact information: [address] [place] [address] [phone]                Reviewed expectations re: course of current medical issues. Questions answered. Outlined signs and symptoms indicating need for more acute intervention. Understanding verbalized. After Visit Summary given.   SUBJECTIVE: History from: Patient. Karina Green is a 67 y.o. female. Pt reports nasal congestion, sore throat, left ear pain x3 days.  Denies: fever. Normal PO intake without n/v/d.  OBJECTIVE:  Vitals:   06/09/23 1716  BP: (!) 148/79  Pulse: 81  Resp: 20  Temp: 98.7 F (37.1 C)  TempSrc: Oral  SpO2: 95%    General appearance: alert; no distress Eyes: PERRLA; EOMI; conjunctiva normal HENT: [place]; AT; with nasal congestion; L TM with erythema/bulging Neck: supple  Lungs: speaks full sentences without difficulty; unlabored Extremities: no edema Skin: warm and dry Neurologic: normal gait Psychological: alert and cooperative; normal mood and affect   Allergies  Allergen Reactions   Iodinated Contrast Media Rash    Very large itchy rash within 24 hours of heart cath     Past Medical History:  Diagnosis Date   Arthritis    Back pain    CAD (coronary artery disease), native coronary artery-70% LM stenosis 10/25/2014   Cough last 30 yrs    nonproductive, no fevers   DM type 2 (diabetes mellitus, type 2) ([facility])  10/25/2014   GERD (gastroesophageal reflux disease)    Gout    Hyperlipidemia    Hypertension    S/P TAVR (transcatheter aortic valve replacement) 02/10/2022   s/p TAVR with 29 mm Medtronic Evolut Fx via the TF approach by Dr. Clifton James & Dr. Laneta Simmers   Severe aortic stenosis    Social History   Socioeconomic History   Marital status: Divorced    Spouse name: Not on file   Number of children: Not on file   Years of education: Not on file   Highest education level: Not on file  Occupational History   Not on file  Tobacco Use   Smoking status: Never   Smokeless tobacco: Never  Vaping Use   Vaping Use: Never used  Substance and Sexual Activity   Alcohol use: No    Alcohol/week: 0.0 standard drinks of alcohol   Drug use: No   Sexual activity: Not on file  Other Topics Concern   Not on file  Social History Narrative   Not on file   Social Determinants of Health   Financial Resource Strain: Not on file  Food Insecurity: Not on file  Transportation Needs: Not on file  Physical Activity: Not on file  Stress: Not on file  Social Connections: Not on file  Intimate Partner Violence: Not on file   Family History  Problem Relation Age of Onset   Hypertension Mother    Coronary artery disease  Mother        CABG   Hypertension Father    CAD Father        CABG   Coronary artery disease Brother        Stent   Past Surgical History:  Procedure Laterality Date   ABDOMINAL HYSTERECTOMY     complete   APPENDECTOMY     bladder tack     CARDIAC CATHETERIZATION  10/25/14   70% LM stensis, normal EF   CHOLECYSTECTOMY     CORONARY ARTERY BYPASS GRAFT N/A 10/29/2014   Procedure: CORONARY ARTERY BYPASS GRAFTING (CABG) TIMES TWO USING LEFT INTERNAL MAMMARY ARTERY AND RIGHT LEG GREATER SAPHENOUS VEIN HARVESTED ENDOSCOPICALLY.;  Surgeon: Kerin Perna, MD;  Location: [facility];  Service: Open Heart Surgery;  Laterality: N/A;   HERNIA REPAIR     INTRAOPERATIVE TRANSESOPHAGEAL  ECHOCARDIOGRAM N/A 10/29/2014   Procedure: INTRAOPERATIVE TRANSESOPHAGEAL ECHOCARDIOGRAM;  Surgeon: Kerin Perna, MD;  Location: [facility] OR;  Service: Open Heart Surgery;  Laterality: N/A;   INTRAOPERATIVE TRANSTHORACIC ECHOCARDIOGRAM N/A 02/10/2022   Procedure: INTRAOPERATIVE TRANSTHORACIC ECHOCARDIOGRAM;  Surgeon: Kathleene Hazel, MD;  Location: [facility] OR;  Service: Open Heart Surgery;  Laterality: N/A;   JOINT REPLACEMENT Right 2009   hip total   LEFT HEART CATHETERIZATION WITH CORONARY ANGIOGRAM N/A 10/26/2014   Procedure: LEFT HEART CATHETERIZATION WITH CORONARY ANGIOGRAM;  Surgeon: Lennette Bihari, MD;  Location: [facility] CATH LAB;  Service: Cardiovascular;  Laterality: N/A;   RIGHT/LEFT HEART CATH AND CORONARY/GRAFT ANGIOGRAPHY N/A 12/29/2021   Procedure: RIGHT/LEFT HEART CATH AND CORONARY/GRAFT ANGIOGRAPHY;  Surgeon: Tonny Bollman, MD;  Location: [facility] INVASIVE CV LAB;  Service: Cardiovascular;  Laterality: N/A;   TONSILLECTOMY     TOTAL HIP ARTHROPLASTY     TOTAL HIP ARTHROPLASTY Left 05/07/2016   Procedure: TOTAL HIP ARTHROPLASTY ANTERIOR APPROACH;  Surgeon: Samson Frederic, MD;  Location: [facility];  Service: Orthopedics;  Laterality: Left;   TRANSCATHETER AORTIC VALVE REPLACEMENT, TRANSFEMORAL N/A 02/10/2022   Procedure: TRANSCATHETER AORTIC VALVE REPLACEMENT, TRANSFEMORAL USING A MEDTRONIC EVOLUT FX TRANSCATHETER AORTIC VALVE.;  Surgeon: Kathleene Hazel, MD;  Location: [facility];  Service: Open Heart Surgery;  Laterality: N/AMardella Layman, MD 06/10/23 [phone]

## 2023-06-29 IMAGING — CT CT ANGIO CHEST
1 of 3 series · 1 of 15 positions shown · non-contrast
Comparison: Chest CTA 06/17/2015.

CLINICAL DATA: 65-year-old female with history of severe aortic
stenosis. Preprocedural study prior to potential transcatheter
aortic valve replacement (TAVR) procedure.

EXAM:
CT ANGIOGRAPHY CHEST, ABDOMEN AND PELVIS
TECHNIQUE: Multidetector CT imaging through the chest, abdomen and pelvis was
performed using the standard protocol during bolus administration of
intravenous contrast. Multiplanar reconstructed images and MIPs were
obtained and reviewed to evaluate the vascular anatomy.

[Series 1601: — · 0.12mm/px · 1 of 5 slices shown]
[im 3/5]
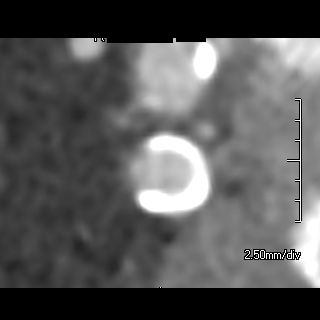

[1 of 15 positions shown; findings below may reference images not displayed]

RADIATION DOSE REDUCTION: This exam was performed according to the
departmental dose-optimization program which includes automated
exposure control, adjustment of the mA and/or kV according to
patient size and/or use of iterative reconstruction technique.

CONTRAST:  100mL OMNIPAQUE IOHEXOL 350 MG/ML SOLN
FINDINGS: CTA CHEST FINDINGS

Cardiovascular: Heart size is normal. There is no significant
pericardial fluid, thickening or pericardial calcification. There is
aortic atherosclerosis, as well as atherosclerosis of the great
vessels of the mediastinum and the coronary arteries, including
calcified atherosclerotic plaque in the left main, left anterior
descending, left circumflex and right coronary arteries. Status post
median sternotomy for CABG including [REDACTED] to the LAD. Severe
thickening and calcification of the aortic valve.

Mediastinum/Lymph Nodes: No pathologically enlarged mediastinal or
hilar lymph nodes. Esophagus is unremarkable in appearance. No
axillary lymphadenopathy.

Lungs/Pleura: 9 mm left upper lobe pulmonary nodule (axial image 33
of series 5), similar in retrospect compared to prior chest CT from
5891 which point this was obscured by overlying airspace disease. No
other suspicious appearing pulmonary nodules or masses are noted. No
acute consolidative airspace disease. No pleural effusions.

Musculoskeletal/Soft Tissues: Median sternotomy wires. There are no
aggressive appearing lytic or blastic lesions noted in the
visualized portions of the skeleton.

CTA ABDOMEN AND PELVIS FINDINGS

Hepatobiliary: Diffuse low attenuation throughout the hepatic
parenchyma, indicative of hepatic steatosis. No suspicious cystic or
solid hepatic lesions. No intra or extrahepatic biliary ductal
dilatation. Gallbladder is not visualized, presumably surgically
absent.

Pancreas: No pancreatic mass. No pancreatic ductal dilatation. No
pancreatic or peripancreatic fluid collections or inflammatory
changes.

Spleen: Unremarkable.

Adrenals/Urinary Tract: Mild multifocal cortical thinning in the
kidneys bilaterally, presumably chronic post infectious or
inflammatory scarring. No suspicious renal lesions. No
hydroureteronephrosis. Urinary bladder is largely obscured by beam
hardening artifact from the patient's bilateral hip arthroplasties.

Stomach/Bowel: The appearance of the stomach is normal. There is no
pathologic dilatation of small bowel or colon. The appendix is not
confidently identified and may be surgically absent. Regardless,
there are no inflammatory changes noted adjacent to the cecum to
suggest the presence of an acute appendicitis at this time.

Vascular/Lymphatic: Aortic atherosclerosis, with vascular findings
and measurements pertinent to potential T AVR procedure, as detailed
below. No aneurysm or dissection noted in the abdominal or pelvic
vasculature. No lymphadenopathy noted in the abdomen or pelvis.

Reproductive: Status post hysterectomy. Right ovary is unremarkable
in appearance. In the left adnexal region (axial image 205 of series
4 and coronal image 85 of series 6) there is a 3.6 x 2.8 x 2.9 cm
intermediate attenuation lesion which is incompletely characterized.

Other: No significant volume of ascites.  No pneumoperitoneum.

Musculoskeletal: Status post bilateral total hip arthroplasties.
There are no aggressive appearing lytic or blastic lesions noted in
the visualized portions of the skeleton.

VASCULAR MEASUREMENTS PERTINENT TO TAVR:

AORTA:

Minimal Aortic Yiameter-I x 8 mm

Severity of Aortic Calcification-severe

RIGHT PELVIS:

Right Common Iliac Artery -

Minimal Jiameter-3.A x 4.7 mm

Tortuosity-mild

Calcification-severe

Right External Iliac Artery -

Minimal 4iameter-S.K x 6.8 mm

Tortuosity-mild

Calcification-none

Right Common Femoral Artery -

Nondiagnostic quality secondary to extensive beam hardening artifact
from bilateral hip arthroplasties.

LEFT PELVIS:

Left Common Iliac Artery -

Minimal Fiameter-Y.Y x 6.2 mm

Tortuosity-mild

Calcification-moderate

Left External Iliac Artery -

Minimal Piameter-V.T x 6.9 mm

Tortuosity-mild

Calcification-none

Left Common Femoral Artery -

Nondiagnostic quality on multiplanar reconstructions secondary to
extensive beam hardening artifact from bilateral hip arthroplasties.
On axial source images, the vessel appears patent with a mean
diameter of approximately 6 mm.

Review of the MIP images confirms the above findings.
IMPRESSION: 1. Vascular findings and measurements pertinent to potential TAVR
procedure, as detailed above. There is a very limited evaluation of
the common femoral arteries bilaterally secondary to beam hardening
artifact from the patient's bilateral hip arthroplasties.
2. Severe thickening and calcification of the aortic valve,
compatible with reported clinical history of severe aortic stenosis.
3. 9 mm left upper lobe pulmonary nodule. This appears similar in
retrospect to remote prior study from 5891 at which point this was
largely obscured by overlying airspace consolidation. This is
strongly favored to be benign, however, one additional noncontrast
chest CT is recommended in 12 months to ensure the stability of this
finding.
4. Left adnexal lesion measuring 3.6 x 2.8 x 2.9 cm which is
intermediate attenuation. This is abnormal in a postmenopausal
patient, and further evaluation with nonemergent pelvic ultrasound
is recommended in the near future to better evaluate this finding.
5. Aortic atherosclerosis, in addition to left main and three-vessel
coronary artery disease. Status post median sternotomy for CABG
including [REDACTED] to the LAD.
6. Hepatic steatosis.
7. Additional incidental findings, as above.

## 2023-06-29 IMAGING — CT CT HEART MORP W/ CTA COR W/ SCORE W/ CA W/CM &/OR W/O CM
2 of 8 series · 10 of 20 positions shown, 12 images · IV contrast (APPLIED)
Comparison: Chest CTA 06/17/2015.
COMPARISON: Chest CTA 06/17/2015.

Addendum:
EXAM:
OVER-READ INTERPRETATION  CT CHEST

The following report is an over-read performed by radiologist Dr.
Iwak Milia [REDACTED] on 01/13/2022. This
over-read does not include interpretation of cardiac or coronary
anatomy or pathology. The coronary calcium score/coronary CTA
interpretation by the cardiologist is attached.
CLINICAL DATA: Aortic stenosis
Cardiac TAVR CT
TECHNIQUE: The patient was scanned on a Siemens Force [REDACTED]ice scanner. A 120
kV retrospective scan was triggered in the descending thoracic aorta
at 111 HU's. Gantry rotation speed was 270 msecs and collimation was
.9 mm. No beta blockade or nitro were given. The 3D data set was
reconstructed in 5% intervals of the R-R cycle. Systolic and
diastolic phases were analyzed on a dedicated work station using
MPR, MIP and VRT modes. The patient received 80 cc of contrast.

[Series 9: 0-90% · axial · 0.39mm/px · z∈[-94,+21]mm · 5 of 2880 slices shown, 7 images]
[im 480/2880  vessel]
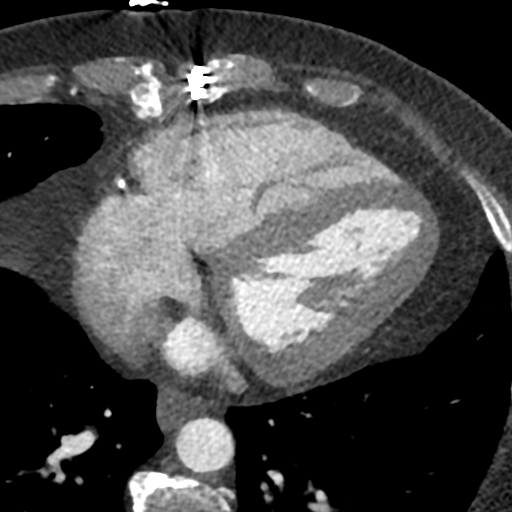
[im 480/2880  lung]
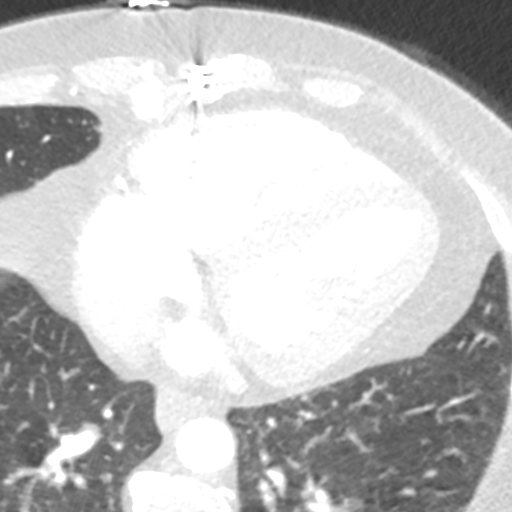
[im 960/2880  vessel]
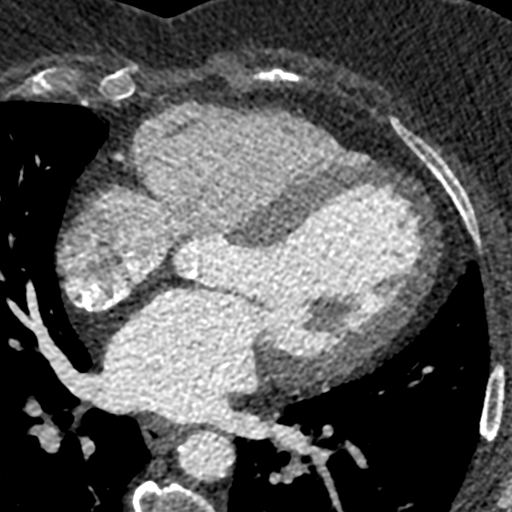
[im 1440/2880  vessel]
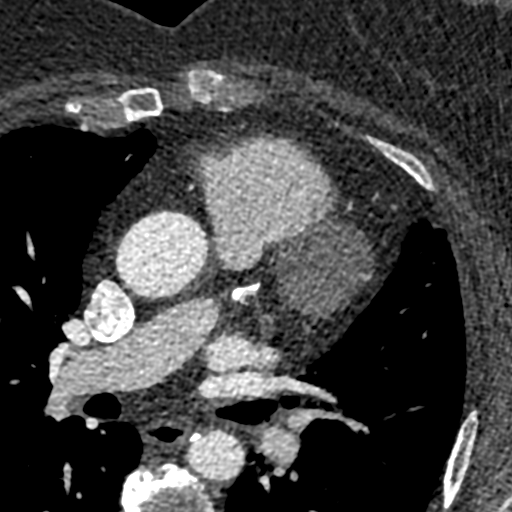
[im 1920/2880  vessel]
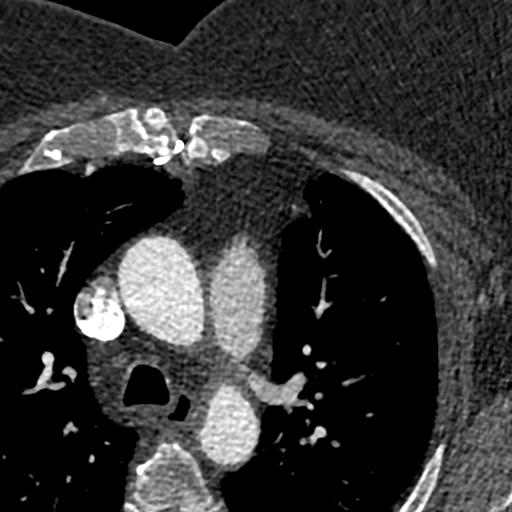
[im 2400/2880  vessel]
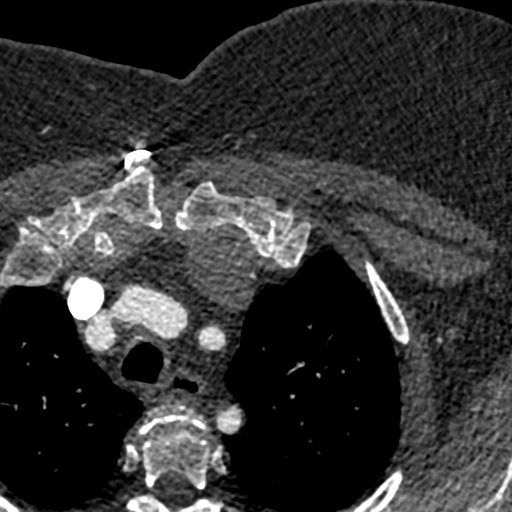
[im 2400/2880  lung]
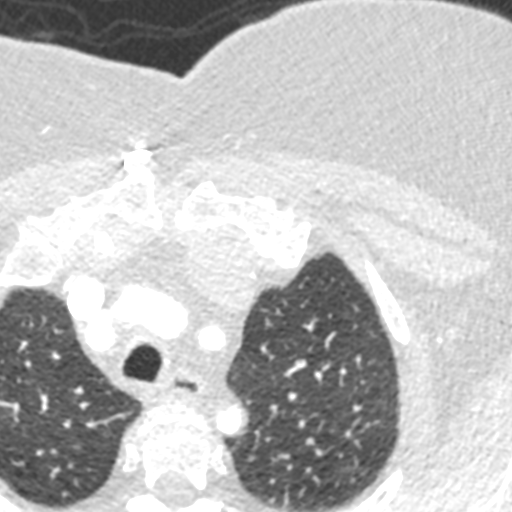

[Series 10: 5-95% · axial · 0.39mm/px · z∈[-94,+21]mm · 5 of 2880 slices shown]
[im 480/2880  vessel]
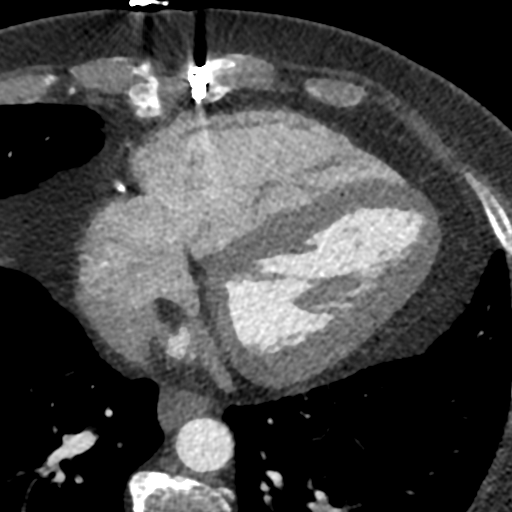
[im 960/2880  vessel]
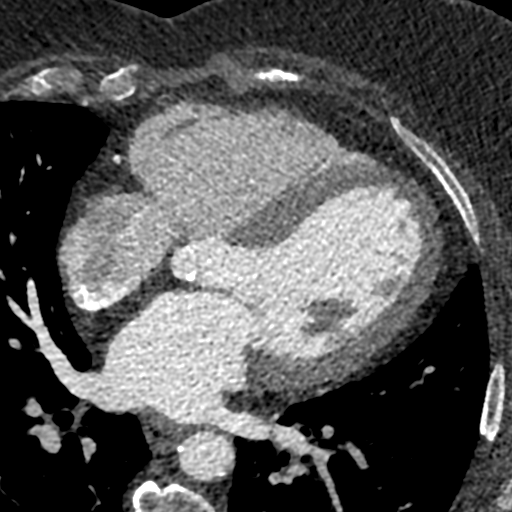
[im 1440/2880  vessel]
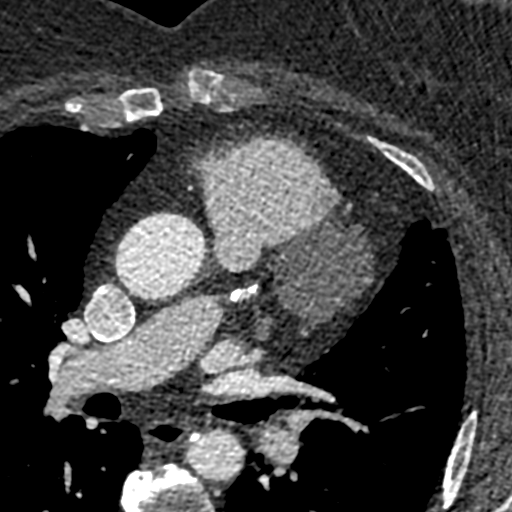
[im 1920/2880  vessel]
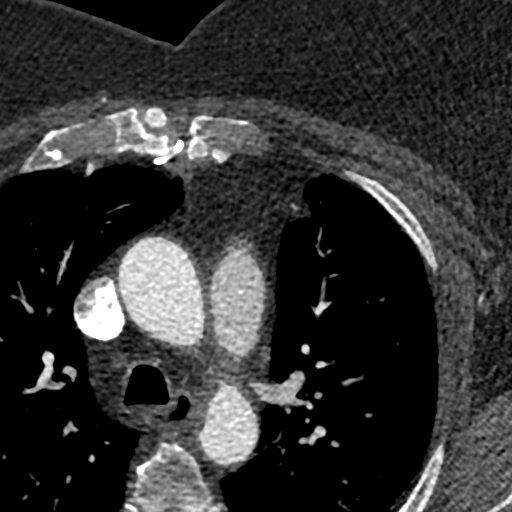
[im 2400/2880  vessel]
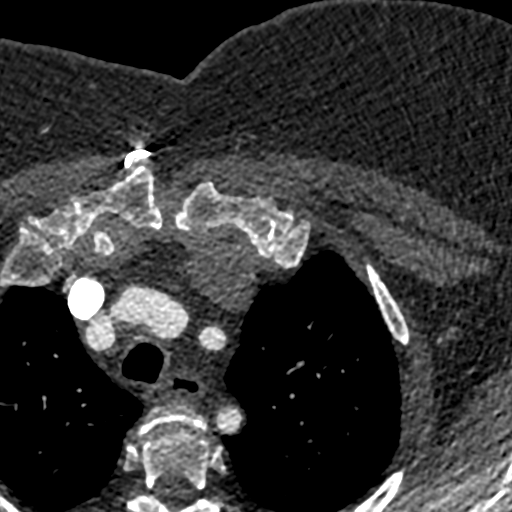

[10 of 20 positions shown; findings below may reference images not displayed]

FINDINGS: Extracardiac findings will be described separately under dictation
for contemporaneously obtained CTA chest, abdomen and pelvis.
IMPRESSION: Please see separate dictation for contemporaneously obtained CTA
chest, abdomen and pelvis dated 01/13/2022 for full description of
relevant extracardiac findings.
FINDINGS: Aortic Valve: Tri leaflet valve calcium score 6463

Aorta: Normal arch vessels no aneurysm mild calcific atherosclerosis

Sinotubular Junction: 27.4 mm

Ascending Thoracic Aorta: 3.5 cm

Aortic Arch: 28 mm

Descending Thoracic Aorta: 24 mm

Sinus of Valsalva Measurements:

Non-coronary: 26.7  mm

Right - coronary: 28.4 mm

Left - coronary: 28.8 mm

Coronary Artery Height above Annulus:

Left Main: 11.5 mm

Right Coronary: 12.9 mm above annulus

Virtual Basal Annulus Measurements:

Maximum/Minimum Diameter: 24.6 mm x 19.9 mm

Perimeter: 72.7 mm

Area: 400 mm2

Coronary Arteries: Patient [REDACTED] to LAD Occluded PA to OM

Optimum Fluoroscopic Angle for Delivery: LAO 9 Cranial 4 degrees
IMPRESSION: 1. Tri leaflet AV with calcium score 6463

2.  Annular area of 400 mm2 suitable for a 23 mm Sapien 3

3. Optimum angiographic angle for deployment LAO 9 Cranial 4 degrees

4.  Normal ascending thoracic aorta 3.5 cm

5.  Occluded PA to OM and patent [REDACTED] to LAD

6.  Membranous Septum length 6.3 mm

Adi Tui Prema Sr

*** End of Addendum ***
EXAM:
OVER-READ INTERPRETATION  CT CHEST

The following report is an over-read performed by radiologist Dr.
Iwak Milia [REDACTED] on 01/13/2022. This
over-read does not include interpretation of cardiac or coronary
anatomy or pathology. The coronary calcium score/coronary CTA
interpretation by the cardiologist is attached.
FINDINGS: Extracardiac findings will be described separately under dictation
for contemporaneously obtained CTA chest, abdomen and pelvis.
IMPRESSION: Please see separate dictation for contemporaneously obtained CTA
chest, abdomen and pelvis dated 01/13/2022 for full description of
relevant extracardiac findings.

## 2023-08-12 ENCOUNTER — Ambulatory Visit: Payer: Medicare HMO | Admitting: Cardiology

## 2023-10-12 DIAGNOSIS — H52209 Unspecified astigmatism, unspecified eye: Secondary | ICD-10-CM | POA: Diagnosis not present

## 2023-10-12 DIAGNOSIS — H524 Presbyopia: Secondary | ICD-10-CM | POA: Diagnosis not present

## 2023-10-12 DIAGNOSIS — H5203 Hypermetropia, bilateral: Secondary | ICD-10-CM | POA: Diagnosis not present

## 2023-10-12 DIAGNOSIS — Z0100 Encounter for examination of eyes and vision without abnormal findings: Secondary | ICD-10-CM | POA: Diagnosis not present

## 2023-10-18 ENCOUNTER — Ambulatory Visit
Admission: EM | Admit: 2023-10-18 | Discharge: 2023-10-18 | Disposition: A | Discharge status: Home / Self Care | Payer: Medicare HMO | Attending: Nurse Practitioner | Admitting: Nurse Practitioner

## 2023-10-18 DIAGNOSIS — L500 Allergic urticaria: Secondary | ICD-10-CM

## 2023-10-18 MED ORDER — CETIRIZINE HCL 10 MG PO TABS: 10.0000 mg | ORAL_TABLET | Freq: Two times a day (BID) | ORAL | 0 refills | Status: DC

## 2023-10-18 MED ORDER — FAMOTIDINE 20 MG PO TABS: 20.0000 mg | ORAL_TABLET | Freq: Two times a day (BID) | ORAL | 0 refills | Status: DC

## 2023-10-18 NOTE — ED Provider Notes (Signed)
[facility]    CSN: [id number] Arrival date & time: 10/18/23  1601      History   Chief Complaint Chief Complaint  Patient presents with   Urticaria    HPI Karina Green is a 67 y.o. female.   Patient presents today with itchy rash all over her body that began yesterday.  She reports this is hives since she has had this before, she usually gets this when she is sick or when she feels anxious.  She denies any recent changes in medications or supplements.  No recent change in any detergents, soaps, personal care buttocks.  Reports the rash is very itchy and starts at her knees and goes up her trunk to her hands and face.  She reports the rash is not painful and is not oozing or leaking anything.  No fevers, nausea/vomiting, shortness of breath, throat or tongue swelling.  No facial or lip swelling, recent changes in environmental exposures, chest pain.  Has taken Benadryl and used Benadryl cream which helps until it wears off.     Past Medical History:  Diagnosis Date   Arthritis    Back pain    CAD (coronary artery disease), native coronary artery-70% LM stenosis 10/25/2014   Cough last 30 yrs    nonproductive, no fevers   DM type 2 (diabetes mellitus, type 2) (HCC) 10/25/2014   GERD (gastroesophageal reflux disease)    Gout    Hyperlipidemia    Hypertension    S/P TAVR (transcatheter aortic valve replacement) 02/10/2022   s/p TAVR with 29 mm Medtronic Evolut Fx via the TF approach by Dr. Clifton James & Dr. Laneta Simmers   Severe aortic stenosis     Patient Active Problem List   Diagnosis Date Noted   S/P TAVR (transcatheter aortic valve replacement) 02/10/2022   Chronic diastolic heart failure (HCC) 06/20/2015   Severe aortic stenosis 06/20/2015   CKD (chronic kidney disease), stage III (HCC) 06/20/2015   CAD (coronary artery disease), native coronary artery-70% LM stenosis 10/26/2014   DM2 (diabetes mellitus, type 2) (HCC) 10/26/2014   Essential hypertension  10/24/2014   Hypercholesteremia 10/24/2014    Past Surgical History:  Procedure Laterality Date   ABDOMINAL HYSTERECTOMY     complete   APPENDECTOMY     bladder tack     CARDIAC CATHETERIZATION  10/25/14   70% LM stensis, normal EF   CHOLECYSTECTOMY     CORONARY ARTERY BYPASS GRAFT N/A 10/29/2014   Procedure: CORONARY ARTERY BYPASS GRAFTING (CABG) TIMES TWO USING LEFT INTERNAL MAMMARY ARTERY AND RIGHT LEG GREATER SAPHENOUS VEIN HARVESTED ENDOSCOPICALLY.;  Surgeon: Kerin Perna, MD;  Location: [facility] OR;  Service: Open Heart Surgery;  Laterality: N/A;   HERNIA REPAIR     INTRAOPERATIVE TRANSESOPHAGEAL ECHOCARDIOGRAM N/A 10/29/2014   Procedure: INTRAOPERATIVE TRANSESOPHAGEAL ECHOCARDIOGRAM;  Surgeon: Kerin Perna, MD;  Location: [facility] OR;  Service: Open Heart Surgery;  Laterality: N/A;   INTRAOPERATIVE TRANSTHORACIC ECHOCARDIOGRAM N/A 02/10/2022   Procedure: INTRAOPERATIVE TRANSTHORACIC ECHOCARDIOGRAM;  Surgeon: Kathleene Hazel, MD;  Location: [facility] OR;  Service: Open Heart Surgery;  Laterality: N/A;   JOINT REPLACEMENT Right 2009   hip total   LEFT HEART CATHETERIZATION WITH CORONARY ANGIOGRAM N/A 10/26/2014   Procedure: LEFT HEART CATHETERIZATION WITH CORONARY ANGIOGRAM;  Surgeon: Lennette Bihari, MD;  Location: [facility] CATH LAB;  Service: Cardiovascular;  Laterality: N/A;   RIGHT/LEFT HEART CATH AND CORONARY/GRAFT ANGIOGRAPHY N/A 12/29/2021   Procedure: RIGHT/LEFT HEART CATH AND CORONARY/GRAFT ANGIOGRAPHY;  Surgeon: Tonny Bollman,  MD;  Location: [facility] INVASIVE CV LAB;  Service: Cardiovascular;  Laterality: N/A;   TONSILLECTOMY     TOTAL HIP ARTHROPLASTY     TOTAL HIP ARTHROPLASTY Left 05/07/2016   Procedure: TOTAL HIP ARTHROPLASTY ANTERIOR APPROACH;  Surgeon: Samson Frederic, MD;  Location: [facility] ORS;  Service: Orthopedics;  Laterality: Left;   TRANSCATHETER AORTIC VALVE REPLACEMENT, TRANSFEMORAL N/A 02/10/2022   Procedure: TRANSCATHETER AORTIC VALVE REPLACEMENT, TRANSFEMORAL USING A MEDTRONIC  EVOLUT FX TRANSCATHETER AORTIC VALVE.;  Surgeon: Kathleene Hazel, MD;  Location: [facility] OR;  Service: Open Heart Surgery;  Laterality: N/A;    OB History   No obstetric history on file.      Home Medications    Prior to Admission medications   Medication Sig Start Date End Date Taking? Authorizing Provider  allopurinol (ZYLOPRIM) 100 MG tablet Take 100 mg by mouth daily. 09/16/17  Yes [provider]  aspirin EC 81 MG tablet Take 81 mg by mouth daily.   Yes [provider]  atorvastatin (LIPITOR) 80 MG tablet TAKE ONE TABLET BY MOUTH ONCE DAILY AT 6 P.M. 03/27/16  Yes Branch, Dorothe Pea, MD  cetirizine (ZYRTEC ALLERGY) 10 MG tablet Take 1 tablet (10 mg total) by mouth 2 (two) times daily. 10/18/23  Yes Valentino Nose, NP  famotidine (PEPCID) 20 MG tablet Take 1 tablet (20 mg total) by mouth 2 (two) times daily. 10/18/23  Yes Valentino Nose, NP  furosemide (LASIX) 20 MG tablet Take 1 tablet (20 mg total) by mouth daily. 03/27/16  Yes Branch, Dorothe Pea, MD  glimepiride (AMARYL) 2 MG tablet Take 1-2 mg by mouth See admin instructions. 1 mg in the morning, 2 mg at bedtime 12/12/21  Yes [provider]  isosorbide mononitrate (IMDUR) 30 MG 24 hr tablet Take 0.5 tablets (15 mg total) by mouth daily. 12/15/17  Yes Antoine Poche, MD  losartan (COZAAR) 25 MG tablet Take 0.5 tablets (12.5 mg total) daily by mouth. 11/05/17  Yes Jodelle Gross, NP  metoprolol tartrate (LOPRESSOR) 25 MG tablet Take 1 tablet by mouth twice daily 02/22/23  Yes Branch, Dorothe Pea, MD  Multiple Vitamin (MULTIVITAMIN WITH MINERALS) TABS tablet Take 1 tablet by mouth daily.   Yes [provider]  Omega-3 Fatty Acids (FISH OIL) 1000 MG CAPS Take 1,000 mg by mouth daily.   Yes [provider]  potassium chloride SA (K-DUR,KLOR-CON) 20 MEQ tablet Take 1 tablet (20 mEq total) by mouth daily. NEED OV. 10/17/18  Yes Jodelle Gross, NP  albuterol (VENTOLIN HFA) 108  (90 Base) MCG/ACT inhaler Inhale 2 puffs into the lungs every 4 (four) hours as needed for wheezing or shortness of breath. 02/27/23   Particia Nearing, PA-C  predniSONE (DELTASONE) 20 MG tablet Take 2 tablets (40 mg total) by mouth daily with breakfast. 02/27/23   Particia Nearing, PA-C    Family History Family History  Problem Relation Age of Onset   Hypertension Mother    Coronary artery disease Mother        CABG   Hypertension Father    CAD Father        CABG   Coronary artery disease Brother        Stent    Social History Social History   Tobacco Use   Smoking status: Never   Smokeless tobacco: Never  Vaping Use   Vaping status: Never Used  Substance Use Topics   Alcohol use: No    Alcohol/week: 0.0 standard  drinks of alcohol   Drug use: No     Allergies   Iodinated contrast media   Review of Systems Review of Systems Per HPI  Physical Exam Triage Vital Signs ED Triage Vitals [10/18/23 1621]  Encounter Vitals Group     BP 126/70     Systolic BP Percentile      Diastolic BP Percentile      Pulse Rate 69     Resp 14     Temp 98.1 F (36.7 C)     Temp Source Oral     SpO2 94 %     Weight      Height      Head Circumference      Peak Flow      Pain Score 0     Pain Loc      Pain Education      Exclude from Growth Chart    No data found.  Updated Vital Signs BP 126/70 (BP Location: Right Arm)   Pulse 69   Temp 98.1 F (36.7 C) (Oral)   Resp 14   SpO2 94%   Visual Acuity Right Eye Distance:   Left Eye Distance:   Bilateral Distance:    Right Eye Near:   Left Eye Near:    Bilateral Near:     Physical Exam Vitals and nursing note reviewed.  Constitutional:      General: She is not in acute distress.    Appearance: Normal appearance. She is not toxic-appearing.  HENT:     Head: Normocephalic.     Mouth/Throat:     Mouth: Mucous membranes are moist.     Pharynx: Oropharynx is clear.  Pulmonary:     Effort: Pulmonary  effort is normal. No respiratory distress.  Skin:    General: Skin is warm and dry.     Capillary Refill: Capillary refill takes less than 2 seconds.     Findings: Rash present. Rash is urticarial.     Comments: Multiple erythematous plaques noted to anterior trunk and forearms.  No surrounding erythema, warmth, redness, or fluctuance.   Neurological:     Mental Status: She is alert and oriented to person, place, and time.  Psychiatric:        Behavior: Behavior is cooperative.      UC Treatments / Results  Labs (all labs ordered are listed, but only abnormal results are displayed) Labs Reviewed - No data to display  EKG   Radiology No results found.  Procedures Procedures (including critical care time)  Medications Ordered in UC Medications - No data to display  Initial Impression / Assessment and Plan / UC Course  I have reviewed the triage vital signs and the nursing notes.  Pertinent labs & imaging results that were available during my care of the patient were reviewed by me and considered in my medical decision making (see chart for details).   Patient is well-appearing, normotensive, afebrile, not tachycardic, not tachypneic, oxygenating well on room air.    1. Allergic urticaria Start oral antihistamine regimen Can also start Pepcid 20 mg twice daily as needed to help with itching Avoid glucocorticoids at this time secondary to history of type 2 diabetes Strict ER precautions discussed with patient  The patient was given the opportunity to ask questions.  All questions answered to their satisfaction.  The patient is in agreement to this plan.    Final Clinical Impressions(s) / UC Diagnoses   Final diagnoses:  Allergic urticaria  Discharge Instructions      Start taking the Zyrtec and Pepcid twice daily to help with the histamine response.  You can continue Benadryl as needed for itching every 6 hours.  Seek care emergently if you develop shortness of  breath, throat or tongue swelling, or difficulty breathing.     ED Prescriptions     Medication Sig Dispense Auth. Provider   cetirizine (ZYRTEC ALLERGY) 10 MG tablet Take 1 tablet (10 mg total) by mouth 2 (two) times daily. 30 tablet Cathlean Marseilles A, NP   famotidine (PEPCID) 20 MG tablet Take 1 tablet (20 mg total) by mouth 2 (two) times daily. 30 tablet Valentino Nose, NP      PDMP not reviewed this encounter.   Valentino Nose, NP 10/18/23 [phone]

## 2023-10-18 NOTE — Discharge Instructions (Signed)
Start taking the Zyrtec and Pepcid twice daily to help with the histamine response.  You can continue Benadryl as needed for itching every 6 hours.  Seek care emergently if you develop shortness of breath, throat or tongue swelling, or difficulty breathing.

## 2023-10-18 NOTE — ED Triage Notes (Signed)
Pt presents with itchy hives/rash all over body that started yesterday. Taking benadryl and benadryl cream with no relief of symptoms.

## 2023-10-25 ENCOUNTER — Other Ambulatory Visit: Payer: Self-pay | Admitting: Cardiology

## 2023-10-25 MED ORDER — METOPROLOL TARTRATE 25 MG PO TABS: 25.0000 mg | ORAL_TABLET | Freq: Two times a day (BID) | ORAL | 0 refills | Status: DC

## 2023-10-26 ENCOUNTER — Encounter: Payer: Self-pay | Admitting: Nurse Practitioner

## 2023-10-26 ENCOUNTER — Ambulatory Visit: Payer: Medicare HMO | Attending: Cardiology | Admitting: Nurse Practitioner

## 2023-10-26 VITALS — BP 170/83 | HR 73 | Ht 63.0 in | Wt 224.2 lb

## 2023-10-26 DIAGNOSIS — R053 Chronic cough: Secondary | ICD-10-CM | POA: Diagnosis not present

## 2023-10-26 DIAGNOSIS — R0609 Other forms of dyspnea: Secondary | ICD-10-CM

## 2023-10-26 DIAGNOSIS — Z1331 Encounter for screening for depression: Secondary | ICD-10-CM | POA: Diagnosis not present

## 2023-10-26 DIAGNOSIS — Z952 Presence of prosthetic heart valve: Secondary | ICD-10-CM | POA: Diagnosis not present

## 2023-10-26 DIAGNOSIS — E785 Hyperlipidemia, unspecified: Secondary | ICD-10-CM

## 2023-10-26 DIAGNOSIS — I10 Essential (primary) hypertension: Secondary | ICD-10-CM

## 2023-10-26 DIAGNOSIS — E1159 Type 2 diabetes mellitus with other circulatory complications: Secondary | ICD-10-CM | POA: Diagnosis not present

## 2023-10-26 DIAGNOSIS — I5032 Chronic diastolic (congestive) heart failure: Secondary | ICD-10-CM | POA: Diagnosis not present

## 2023-10-26 DIAGNOSIS — J069 Acute upper respiratory infection, unspecified: Secondary | ICD-10-CM | POA: Diagnosis not present

## 2023-10-26 DIAGNOSIS — Z0001 Encounter for general adult medical examination with abnormal findings: Secondary | ICD-10-CM | POA: Diagnosis not present

## 2023-10-26 DIAGNOSIS — E7849 Other hyperlipidemia: Secondary | ICD-10-CM | POA: Diagnosis not present

## 2023-10-26 DIAGNOSIS — E782 Mixed hyperlipidemia: Secondary | ICD-10-CM | POA: Diagnosis not present

## 2023-10-26 DIAGNOSIS — I2510 Atherosclerotic heart disease of native coronary artery without angina pectoris: Secondary | ICD-10-CM

## 2023-10-26 DIAGNOSIS — Z23 Encounter for immunization: Secondary | ICD-10-CM | POA: Diagnosis not present

## 2023-10-26 DIAGNOSIS — F419 Anxiety disorder, unspecified: Secondary | ICD-10-CM | POA: Diagnosis not present

## 2023-10-26 DIAGNOSIS — Z6841 Body Mass Index (BMI) 40.0 and over, adult: Secondary | ICD-10-CM | POA: Diagnosis not present

## 2023-10-26 NOTE — Progress Notes (Signed)
Cardiology Office Note:  .   Date:  10/26/2023  ID:  Karina Green, DOB 1956/10/08, MRN [id number] PCP: [facility]  [facility] Providers Cardiologist:  Dina Rich, MD    History of Present Illness: .   Karina Green is a 67 y.o. female with a PMH of CAD, s/p CABG in 2015, hyperlipidemia, hypertension, aortic stenosis, s/p TAVR in 2023, palpitations, and history of lung nodule, who presents today for 6 month follow-up.   Last seen by Dr. Dina Rich on February 10, 2023.  She was overall doing well at that time.  Her EKG revealed sinus bradycardia with heart rate in the low 50s, she is asymptomatic with this.  Today she presents for scheduled 6 month follow-up. She endorses shortness of breath with exertion since August 2024, notices this when taking garbage into the house and has a little incline up her driveway. Symptoms are stable over time. During interview, patient is noted to have raspy, strong/nonproductive cough that is quite frequent in occurrence. Says this has been chronic for 20 years and is like this during the day. Denies ever having Covid. Was on Ace-inhibitor and was taken off, cough never went away. Remote hx of smoking for only 3 months. Does have positive hx of second hand tobacco exposure, says her father smoked her whole life. Denies any chest pain,  palpitations, syncope, presyncope, dizziness, orthopnea, PND, swelling or significant weight changes, acute bleeding, or claudication.  ROS: Negative. See HPI.   Studies Reviewed: .    Echo 01/2023:  1. Left ventricular ejection fraction, by estimation, is 60 to 65%. The  left ventricle has normal function. The left ventricle has no regional  wall motion abnormalities. Left ventricular diastolic parameters are  consistent with Grade II diastolic  dysfunction (pseudonormalization). The average left ventricular global  longitudinal strain is -23.8 %. The global longitudinal strain is  normal.   2. Right ventricular systolic function is normal. The right ventricular  size is normal. There is normal pulmonary artery systolic pressure. The  estimated right ventricular systolic pressure is 27.2 mmHg.   3. The mitral valve is degenerative. Mild mitral valve regurgitation.   4. The aortic valve has been repaired/replaced. Aortic valve  regurgitation is not visualized. There is a 29 mm Medtronic Evolut FX TAVR  valve present in the aortic position. Aortic valve mean gradient measures  5.5 mmHg.   5. The inferior vena cava is normal in size with greater than 50%  respiratory variability, suggesting right atrial pressure of 3 mmHg.   Comparison(s): No significant change from prior study. Prior images  reviewed side by side.  CT Coronary 12/2021:  IMPRESSION: 1. Tri leaflet AV with calcium score 1705   2.  Annular area of 400 mm2 suitable for a 23 mm Sapien 3   3. Optimum angiographic angle for deployment LAO 9 Cranial 4 degrees   4.  Normal ascending thoracic aorta 3.5 cm   5.  Occluded SVG to OM and patent LIMA to LAD   6.  Membranous Septum length 6.3 mm  Right/left heart cath 12/2021:    Prox RCA lesion is 40% stenosed.   Mid RCA lesion is 40% stenosed.   Dist LM lesion is 70% stenosed.   Ost Cx lesion is 50% stenosed.   Ost LAD to Prox LAD lesion is 75% stenosed.   Origin to Prox Graft lesion is 100% stenosed.   1.  Moderately severe 70% distal left main stenosis 2.  Severe 75% proximal LAD stenosis 3.  Moderate 50% proximal circumflex stenosis 4.  Patent RCA with mild plaquing, dominant vessel 5. Continued patency of the LIMA-LAD 6. Chronic occlusion of the SVG-OM 7.  Severe calcific aortic stenosis with peak to peak gradient of 66 mmHg. 8.  Mildly elevated right heart pressures, suspect secondary to left heart disease   Recommendations: Medical therapy for CAD, TAVR evaluation for treatment of severe aortic stenosis.  Risk Assessment/Calculations:     HYPERTENSION CONTROL Vitals:   10/26/23 0929 10/26/23 0930 10/26/23 1000  BP: (!) 144/74 (!) 144/74 (!) 170/83    The patient's blood pressure is elevated above target today.  In order to address the patient's elevated BP: Blood pressure will be monitored at home to determine if medication changes need to be made.; Follow up with general cardiology has been recommended.          Physical Exam:   VS:  BP (!) 170/83 (BP Location: Right Arm, Patient Position: Sitting, Cuff Size: Normal)   Pulse 73   Ht 5\' 3"  (1.6 m)   Wt 224 lb 3.2 oz (101.7 kg)   SpO2 97%   BMI 39.72 kg/m    Wt Readings from Last 3 Encounters:  10/26/23 224 lb 3.2 oz (101.7 kg)  02/10/23 222 lb (100.7 kg)  08/04/22 227 lb (103 kg)    GEN: Obese, 67 y.o. female in no acute distress NECK: No JVD; No carotid bruits CARDIAC: S1/S2, RRR, Grade 1/6 murmur, no rubs, no gallops RESPIRATORY:  Clear and diminished to auscultation without rales, wheezing or rhonchi; strong/raspy/nonproductive cough that was frequent during patient interview EXTREMITIES:  No edema; No deformity   ASSESSMENT AND PLAN: .    CAD, s/p CABG Stable with no anginal symptoms. No indication for ischemic evaluation.  Dyspnea on exertion as noted below-see #5 does not sound cardiac in etiology.  See below.  Continue current medication regimen. Heart healthy diet and regular cardiovascular exercise encouraged. Care and ED precautions discussed.  HTN Blood pressure elevated today.  Discussed SBP goal is less than 140. Discussed to monitor BP at home at least 2 hours after medications and sitting for 5-10 minutes.  Given BP log and salty 6. Heart healthy diet and regular cardiovascular exercise encouraged.  She will notify our office if her SBP is consistently greater than 140.  She verbalized understanding.  HLD Previous LDL in December 2023 was 91.  She is not currently at goal.  Zetia in the past was too expensive.  At next follow-up, plan to  discuss PCSK9i and/or possibly nexlizet. Heart healthy diet and regular cardiovascular exercise encouraged.   Aortic valve stenosis, s/p TAVR Does admit to some shortness of breath with exertion that has been ongoing and stable since August 2024.  Reviewed echocardiogram from February 2024 with her that was reassuring.  Normal valvular function noted.  SBE prophylaxis discussed.  She verbalized understanding.  No medication changes at this time.  DOE, chronic cough She admits to dyspnea on exertion for the past 3 months, symptoms are stable over time.  Admits to chronic cough for the last 20 years.  Nagging/strong/nonproductive cough on exam noted.  Denies any recent tobacco use but does admit to significant secondhand tobacco exposure.  No longer on ACE inhibitor and says still having chronic cough.  Will refer to pulmonology for further evaluation, would recommend PFTs for further evaluation as this appears to be pulmonary in etiology.  Dispo: Follow-up with me/APP in 2 to 3  months or sooner if anything changes.  Signed, Sharlene Dory, NP

## 2023-10-26 NOTE — Patient Instructions (Addendum)

## 2023-11-28 NOTE — Progress Notes (Unsigned)
Karina Green, female    DOB: Apr 29, 1956    MRN: [id number]   Brief patient profile:  1  yowf  never smoker  referred to pulmonary clinic in [place]  11/30/2023 by Dr Wyline Mood for chronic cough onset 2000 while  on ACEi  and also exposed to macrodantin  remotely though Ct 05/03/23 nl    Baseline wt = around  175    History of Present Illness  11/30/2023  Pulmonary/ 1st office eval/ Karina Green / [place] [facility]  Chief Complaint  Patient presents with   Cough  Dyspnea:  more trouble hauling trash up to street  x 3-4 months  Cough: most bothersome noct/ dry hack to point of urinary incontinence Sleep: bed is flat on side with 2 pillows under head  SABA use: none - tried once didn't work  02: none  Zyrtec did not help and has no nasal cc   No obvious patterns in day to day or daytime  variability or assoc excess/ purulent sputum or mucus plugs or hemoptysis or cp or chest tightness, subjective wheeze or overt sinus or hb symptoms.    Also denies any obvious fluctuation of symptoms with weather or environmental changes or other aggravating or alleviating factors except as outlined above   No unusual exposure hx or h/o childhood pna/ asthma or knowledge of premature birth.  Current Allergies, Complete Past Medical History, Past Surgical History, Family History, and Social History were reviewed in [employer] record.  ROS  The following are not active complaints unless bolded Hoarseness, sore throat/globus sensation , dysphagia, dental problems, itching, sneezing,  nasal congestion or discharge of excess mucus or purulent secretions, ear ache,   fever, chills, sweats, unintended wt loss or wt gain, classically pleuritic or exertional cp,  orthopnea pnd or arm/hand swelling  or leg swelling, presyncope, palpitations, abdominal pain, anorexia, nausea, vomiting, diarrhea  or change in bowel habits or change in bladder habits, change in stools or change in urine, dysuria,  hematuria,  rash, arthralgias, visual complaints, headache, numbness, weakness or ataxia or problems with walking or coordination,  change in mood or  memory.            Outpatient Medications Prior to Visit  Medication Sig Dispense Refill   albuterol (VENTOLIN HFA) 108 (90 Base) MCG/ACT inhaler Not taking   0   allopurinol (ZYLOPRIM) 100 MG tablet Take 100 mg by mouth daily.     aspirin EC 81 MG tablet Take 81 mg by mouth daily.     atorvastatin (LIPITOR) 80 MG tablet TAKE ONE TABLET BY MOUTH ONCE DAILY AT 6 P.M. 90 tablet 01   cetirizine (ZYRTEC ALLERGY) 10 MG tablet Not taking   0   famotidine (PEPCID) 20 MG tablet Not taking   0   furosemide (LASIX) 20 MG tablet Take 1 tablet (20 mg total) by mouth daily. 90 tablet 1   glimepiride (AMARYL) 2 MG tablet Take 1-2 mg by mouth See admin instructions. 1 mg in the morning, 2 mg at bedtime     isosorbide mononitrate (IMDUR) 30 MG 24 hr tablet Take 0.5 tablets (15 mg total) by mouth daily. 45 tablet 3   losartan (COZAAR) 25 MG tablet Take 0.5 tablets (12.5 mg total) daily by mouth. 45 tablet 3   metoprolol tartrate (LOPRESSOR) 25 MG tablet Take 1 tablet (25 mg total) by mouth 2 (two) times daily. 60 tablet 0   Multiple Vitamin (MULTIVITAMIN WITH MINERALS) TABS tablet Take 1 tablet  by mouth daily.     Omega-3 Fatty Acids (FISH OIL) 1000 MG CAPS Take 1,000 mg by mouth daily.     potassium chloride SA (K-DUR,KLOR-CON) 20 MEQ tablet Take 1 tablet (20 mEq total) by mouth daily. NEED OV. 90 tablet 0      Past Medical History:  Diagnosis Date   Arthritis    Back pain    CAD (coronary artery disease), native coronary artery-70% LM stenosis 10/25/2014   Cough last 30 yrs    nonproductive, no fevers   DM type 2 (diabetes mellitus, type 2) (HCC) 10/25/2014   GERD (gastroesophageal reflux disease)    Gout    Hyperlipidemia    Hypertension    S/P TAVR (transcatheter aortic valve replacement) 02/10/2022   s/p TAVR with 29 mm Medtronic Evolut Fx via  the TF approach by Dr. Clifton James & Dr. Laneta Simmers   Severe aortic stenosis       Objective:     BP (!) 159/68   Pulse 72   Ht 5\' 3"  (1.6 m)   Wt 223 lb (101.2 kg)   SpO2 96%   BMI 39.50 kg/m   SpO2: 96 %  RA   Slt hoarse mod obese wf nad / cough trigger points to suprasternal notch where feels sense of a stuck ball ir fur,  cough occurs sometimes early on  insp . Never on FVC   HEENT : Oropharynx  clear      Nasal turbinates nl / ears clear    NECK :  without  apparent JVD/ palpable Nodes/TM    LUNGS: no acc muscle use,  Nl contour chest which is clear to A and P bilaterally without cough on insp or exp maneuvers   CV:  RRR  no s3  2/6 sem s   increase in P2, and no edema   ABD:  soft and non tenders  MS:  Nl gait/ ext warm without deformities Or obvious joint restrictions  calf tenderness, cyanosis or clubbing    SKIN: warm and dry without lesions    NEURO:  alert, approp, nl sensorium with  no motor or cerebellar deficits apparent.    I personally reviewed images and agree with radiology impression as follows:   Chest CT w/o contrast 05/03/23  stable 10 mm left upper lobe pulmonary nodule, safely considered benign given its stability since prior examination of 2016, when accounting for changes in slice selection.        Assessment   Upper airway cough syndrome Onset 2000 while on ACEi and no better p stopped it  - CT chest 05/03/23 nl  - Allergy screen 11/30/2023 >  Eos 0. /  IgE   - 11/30/2023 rx cyclical cough with max gerd rx/ 1st gen H1 blockers per guidelines  and deslym suppemented with tessalon plus depomedrol 120 mg x I  Classic Upper airway cough syndrome (previously labeled PNDS),  is so named because it's frequently impossible to sort out how much is  CR/sinusitis with freq throat clearing (which can be related to primary GERD)   vs  causing  secondary (" extra esophageal")  GERD from wide swings in gastric pressure that occur with throat clearing, often   promoting self use of mint and menthol lozenges that reduce the lower esophageal sphincter tone and exacerbate the problem further in a cyclical fashion.   These are the same pts (now being labeled as having "irritable larynx syndrome" by some cough centers) who not infrequently have a history  of having failed to tolerate ace inhibitors,  dry powder inhalers or biphosphonates or report having atypical/extraesophageal reflux symptoms= globus/ LPR  that don't respond to standard doses of PPI  and are easily confused as having aecopd or asthma flares by even experienced allergists/ pulmonologists (myself included).   Of the three most common causes of  Sub-acute / recurrent or chronic cough, only one (GERD)  can actually contribute to/ trigger  the other two (asthma and post nasal drip syndrome)  and perpetuate the cylce of cough.  While not intuitively obvious, many patients with chronic low grade reflux do not cough until there is a primary insult that disturbs the protective epithelial barrier and exposes sensitive nerve endings.   This is typically viral but can due to PNDS and  either may apply here.     >>> The point is that once this occurs, it is difficult to eliminate the cycle  using anything but a maximally effective acid suppression regimen at least in the short run, accompanied by an appropriate diet to address non acid GERD and control / eliminate the cough itself for at least 7 days with delsym/tessalon, eliminate any throat tickle with 1st gen H1 blockers per guidelines  and >>> also  added depomedrol 120 mg IM  in case of component of Th-2 driven upper or lower airways inflammation (if cough responds short term only to relapse before return while will on full rx for uacs (as above), then  that would point to allergic rhinitis/ asthma or eos bronchitis as alternative dx)    F/u in 6 weeks call sooner if needed   Advised:  The standardized cough guidelines published in Chest by [place]  in 2006 are still the best available and consist of a multiple step process (up to 12!) , not a single office visit,  and are intended  to address this problem logically,  with an alogrithm dependent on response to empiric treatment at  each progressive step  to determine a specific diagnosis with  minimal addtional testing needed. Therefore if adherence is an issue or can't be accurately verified,  it's very unlikely the standard evaluation and treatment will be successful here.    Furthermore, response to therapy (other than acute cough suppression, which should only be used short term with avoidance of narcotic containing cough syrups if possible), can be a gradual process for which the patient is not likely to  perceive immediate benefit.  Unlike going to an eye doctor where the best perscription is almost always the first one and is immediately effective, this is almost never the case in the management of chronic cough syndromes. Therefore the patient needs to commit up front to consistently adhere to recommendations  for up to 6 weeks of therapy directed at the likely underlying problem(s) before the response can be reasonably evaluated.   Each maintenance medication was reviewed in detail including emphasizing most importantly the difference between maintenance and prns and under what circumstances the prns are to be triggered using an action plan format where appropriate.    DOE (dyspnea on exertion) Onset summer 2024  - CT chest s contrast  05/03/23 nl  - 11/30/2023   Walked on RA  x  3 lap(s) =  approx 450  ft  @ mod fast pace, stopped due to end of study s sob  with lowest 02 sats 94%    Likely just a conitioning issue based on initial eval so will focus on rx for  cough 1st then decide whether  further w/u needed at next ov  Each maintenance medication was reviewed in detail including emphasizing most importantly the difference between maintenance and prns and under what circumstances the  prns are to be triggered using an action plan format where appropriate.  Total time for H and P, chart review, counseling,  directly observing portions of ambulatory 02 saturation study/ and generating customized AVS unique to this office visit / same day charting = 60 min for multiple  refractory respiratory  symptoms of uncertain etiology                     Sandrea Hughs, MD 11/30/2023

## 2023-11-30 ENCOUNTER — Ambulatory Visit: Payer: Medicare HMO | Admitting: Internal Medicine

## 2023-11-30 ENCOUNTER — Other Ambulatory Visit: Payer: Self-pay

## 2023-11-30 ENCOUNTER — Encounter: Payer: Self-pay | Admitting: Internal Medicine

## 2023-11-30 VITALS — BP 159/68 | HR 72 | Ht 63.0 in | Wt 223.0 lb

## 2023-11-30 DIAGNOSIS — R058 Other specified cough: Secondary | ICD-10-CM

## 2023-11-30 DIAGNOSIS — R0609 Other forms of dyspnea: Secondary | ICD-10-CM | POA: Diagnosis not present

## 2023-11-30 DIAGNOSIS — L409 Psoriasis, unspecified: Secondary | ICD-10-CM | POA: Insufficient documentation

## 2023-11-30 MED ORDER — BENZONATATE 200 MG PO CAPS: 200.0000 mg | ORAL_CAPSULE | Freq: Three times a day (TID) | ORAL | 1 refills | Status: DC | PRN

## 2023-11-30 MED ORDER — METHYLPREDNISOLONE ACETATE 80 MG/ML IJ SUSP
120.0000 mg | Freq: Once | INTRAMUSCULAR | Status: AC
  Administered 2023-11-30: 120 mg via INTRAMUSCULAR

## 2023-11-30 MED ORDER — PANTOPRAZOLE SODIUM 40 MG PO TBEC: 40.0000 mg | DELAYED_RELEASE_TABLET | Freq: Every day | ORAL | 2 refills | Status: DC

## 2023-11-30 NOTE — Assessment & Plan Note (Addendum)
Onset 2000 while on ACEi and no better p stopped it  - CT chest 05/03/23 nl  - Allergy screen 11/30/2023 >  Eos 0. /  IgE   - 11/30/2023 rx cyclical cough with max gerd rx/ 1st gen H1 blockers per guidelines  and deslym suppemented with tessalon plus depomedrol 120 mg x I  Classic Upper airway cough syndrome (previously labeled PNDS),  is so named because it's frequently impossible to sort out how much is  CR/sinusitis with freq throat clearing (which can be related to primary GERD)   vs  causing  secondary (" extra esophageal")  GERD from wide swings in gastric pressure that occur with throat clearing, often  promoting self use of mint and menthol lozenges that reduce the lower esophageal sphincter tone and exacerbate the problem further in a cyclical fashion.   These are the same pts (now being labeled as having "irritable larynx syndrome" by some cough centers) who not infrequently have a history of having failed to tolerate ace inhibitors,  dry powder inhalers or biphosphonates or report having atypical/extraesophageal reflux symptoms= globus/ LPR  that don't respond to standard doses of PPI  and are easily confused as having aecopd or asthma flares by even experienced allergists/ pulmonologists (myself included).   Of the three most common causes of  Sub-acute / recurrent or chronic cough, only one (GERD)  can actually contribute to/ trigger  the other two (asthma and post nasal drip syndrome)  and perpetuate the cylce of cough.  While not intuitively obvious, many patients with chronic low grade reflux do not cough until there is a primary insult that disturbs the protective epithelial barrier and exposes sensitive nerve endings.   This is typically viral but can due to PNDS and  either may apply here.     >>> The point is that once this occurs, it is difficult to eliminate the cycle  using anything but a maximally effective acid suppression regimen at least in the short run, accompanied by an  appropriate diet to address non acid GERD and control / eliminate the cough itself for at least 7 days with delsym/tessalon, eliminate any throat tickle with 1st gen H1 blockers per guidelines  and >>> also  added depomedrol 120 mg IM  in case of component of Th-2 driven upper or lower airways inflammation (if cough responds short term only to relapse before return while will on full rx for uacs (as above), then  that would point to allergic rhinitis/ asthma or eos bronchitis as alternative dx)    F/u in 6 weeks call sooner if needed   Advised:  The standardized cough guidelines published in Chest by [place] in 2006 are still the best available and consist of a multiple step process (up to 12!) , not a single office visit,  and are intended  to address this problem logically,  with an alogrithm dependent on response to empiric treatment at  each progressive step  to determine a specific diagnosis with  minimal addtional testing needed. Therefore if adherence is an issue or can't be accurately verified,  it's very unlikely the standard evaluation and treatment will be successful here.    Furthermore, response to therapy (other than acute cough suppression, which should only be used short term with avoidance of narcotic containing cough syrups if possible), can be a gradual process for which the patient is not likely to  perceive immediate benefit.  Unlike going to an eye doctor where the best perscription  is almost always the first one and is immediately effective, this is almost never the case in the management of chronic cough syndromes. Therefore the patient needs to commit up front to consistently adhere to recommendations  for up to 6 weeks of therapy directed at the likely underlying problem(s) before the response can be reasonably evaluated.   Each maintenance medication was reviewed in detail including emphasizing most importantly the difference between maintenance and prns and under what  circumstances the prns are to be triggered using an action plan format where appropriate.

## 2023-11-30 NOTE — Patient Instructions (Addendum)
Please remember to go to the lab department   for your tests - we will call you with the results when they are available.   Pantoprazole (protonix) 40 mg   Take  30-60 min before first meal of the day and Pepcid (famotidine)  20 mg after supper until return to office - this is the best way to tell whether stomach acid is contributing to your problem.  GERD (REFLUX)  is an extremely common cause of respiratory symptoms just like yours , many times with no obvious heartburn at all.    It can be treated with medication, but also with lifestyle changes including elevation of the head of your bed (ideally with 6 -8inch blocks under the headboard of your bed),  Smoking cessation, avoidance of late meals, excessive alcohol, and avoid fatty foods, chocolate, peppermint, colas, red wine, and acidic juices such as orange juice.  NO MINT OR MENTHOL PRODUCTS SO NO COUGH DROPS  USE SUGARLESS CANDY INSTEAD (Jolley ranchers or Stover's or Life Savers) or even ice chips will also do - the key is to swallow to prevent all throat clearing. NO OIL BASED VITAMINS - use powdered substitutes.  Avoid fish oil when coughing.      Try delsym otc  2 tsp every 12 hours as long as you feel the urge to cough and supplement with Tessalon 200 mg every 6 hours as needed to completely stop all cough if possible   For drainage / throat tickle try take CHLORPHENIRAMINE  4 mg  ("Allergy Relief" 4mg   at [facility] should be easiest to find in the blue box usually on bottom shelf)  take one every 4 hours as needed - extremely effective and inexpensive over the counter- may cause drowsiness so start with just a dose or two an hour before bedtime and see how you tolerate it before trying in daytime.   Depomedrol 120 mg IM   Please schedule a follow up office visit in 6 weeks, call sooner if needed

## 2023-11-30 NOTE — Assessment & Plan Note (Addendum)
Onset summer 2024  - CT chest s contrast  05/03/23 nl  - 11/30/2023   Walked on RA  x  3 lap(s) =  approx 450  ft  @ mod fast pace, stopped due to end of study s sob  with lowest 02 sats 94%    Likely just a conitioning issue based on initial eval so will focus on rx for cough 1st then decide whether  further w/u needed at next ov  Each maintenance medication was reviewed in detail including emphasizing most importantly the difference between maintenance and prns and under what circumstances the prns are to be triggered using an action plan format where appropriate.  Total time for H and P, chart review, counseling,  directly observing portions of ambulatory 02 saturation study/ and generating customized AVS unique to this office visit / same day charting = 60 min for multiple  refractory respiratory  symptoms of uncertain etiology

## 2023-12-01 LAB — CBC WITH DIFFERENTIAL/PLATELET
Basophils Absolute: 0.1 10*3/uL (ref 0.0–0.2)
Basos: 1 %
EOS (ABSOLUTE): 0.3 10*3/uL (ref 0.0–0.4)
Eos: 3 %
Hematocrit: 42.8 % (ref 34.0–46.6)
Hemoglobin: 14.1 g/dL (ref 11.1–15.9)
Immature Grans (Abs): 0 10*3/uL (ref 0.0–0.1)
Immature Granulocytes: 0 %
Lymphocytes Absolute: 3.8 10*3/uL — ABNORMAL HIGH (ref 0.7–3.1)
Lymphs: 39 %
MCH: 28.2 pg (ref 26.6–33.0)
MCHC: 32.9 g/dL (ref 31.5–35.7)
MCV: 86 fL (ref 79–97)
Monocytes Absolute: 0.8 10*3/uL (ref 0.1–0.9)
Monocytes: 8 %
Neutrophils Absolute: 4.7 10*3/uL (ref 1.4–7.0)
Neutrophils: 49 %
Platelets: 207 10*3/uL (ref 150–450)
RBC: 5 x10E6/uL (ref 3.77–5.28)
RDW: 14.2 % (ref 11.7–15.4)
WBC: 9.6 10*3/uL (ref 3.4–10.8)

## 2023-12-01 LAB — D-DIMER, QUANTITATIVE: D-DIMER: 0.62 mg{FEU}/L — ABNORMAL HIGH (ref 0.00–0.49)

## 2023-12-07 ENCOUNTER — Telehealth: Payer: Self-pay | Admitting: Internal Medicine

## 2023-12-07 NOTE — Telephone Encounter (Signed)
See result note for documentation

## 2023-12-07 NOTE — Telephone Encounter (Signed)
Patient is trying to return missed call in reference to lab work.

## 2024-01-08 ENCOUNTER — Ambulatory Visit
Admission: EM | Admit: 2024-01-08 | Discharge: 2024-01-08 | Disposition: A | Discharge status: Home / Self Care | Payer: Medicare HMO | Attending: Nurse Practitioner | Admitting: Nurse Practitioner

## 2024-01-08 DIAGNOSIS — J020 Streptococcal pharyngitis: Secondary | ICD-10-CM

## 2024-01-08 LAB — POCT RAPID STREP A (OFFICE): Rapid Strep A Screen: POSITIVE — AB

## 2024-01-08 MED ORDER — LIDOCAINE VISCOUS HCL 2 % MT SOLN: OROMUCOSAL | 0 refills | Status: DC

## 2024-01-08 MED ORDER — AMOXICILLIN 500 MG PO CAPS
500.0000 mg | ORAL_CAPSULE | Freq: Two times a day (BID) | ORAL | 0 refills | Status: AC
Start: 2024-01-08 — End: 2024-01-18

## 2024-01-08 NOTE — ED Triage Notes (Signed)
Pt reports she has had a sore throat  and a left ear ache x 1 day

## 2024-01-08 NOTE — ED Provider Notes (Signed)
[facility]    CSN: [id number] Arrival date & time: 01/08/24  0830      History   Chief Complaint No chief complaint on file.   HPI Karina Green is a 68 y.o. female.   The history is provided by the patient.   Patient presents for complaints of sore throat, and left ear pain that started over the past 24 hours.  Denies fever, chills, headache, nasal congestion runny nose, chest pain, abdominal pain, nausea, vomiting, diarrhea, or rash.  Patient does have a cough, but per her chart review, cough is chronic.  States that several members in her home have been sick.    Past Medical History:  Diagnosis Date   Arthritis    Back pain    CAD (coronary artery disease), native coronary artery-70% LM stenosis 10/25/2014   Cough last 30 yrs    nonproductive, no fevers   DM type 2 (diabetes mellitus, type 2) (HCC) 10/25/2014   GERD (gastroesophageal reflux disease)    Gout    Hyperlipidemia    Hypertension    S/P TAVR (transcatheter aortic valve replacement) 02/10/2022   s/p TAVR with 29 mm Medtronic Evolut Fx via the TF approach by Dr. Clifton James & Dr. Laneta Simmers   Severe aortic stenosis     Patient Active Problem List   Diagnosis Date Noted   Psoriasis 11/30/2023   Upper airway cough syndrome 11/30/2023   DOE (dyspnea on exertion) 11/30/2023   S/P TAVR (transcatheter aortic valve replacement) 02/10/2022   Chronic diastolic heart failure (HCC) 06/20/2015   Severe aortic stenosis 06/20/2015   CKD (chronic kidney disease), stage III (HCC) 06/20/2015   CAD (coronary artery disease), native coronary artery-70% LM stenosis 10/26/2014   DM2 (diabetes mellitus, type 2) (HCC) 10/26/2014   Essential hypertension 10/24/2014   Hypercholesteremia 10/24/2014    Past Surgical History:  Procedure Laterality Date   ABDOMINAL HYSTERECTOMY     complete   APPENDECTOMY     bladder tack     CARDIAC CATHETERIZATION  10/25/14   70% LM stensis, normal EF   CHOLECYSTECTOMY     CORONARY  ARTERY BYPASS GRAFT N/A 10/29/2014   Procedure: CORONARY ARTERY BYPASS GRAFTING (CABG) TIMES TWO USING LEFT INTERNAL MAMMARY ARTERY AND RIGHT LEG GREATER SAPHENOUS VEIN HARVESTED ENDOSCOPICALLY.;  Surgeon: Kerin Perna, MD;  Location: [facility] OR;  Service: Open Heart Surgery;  Laterality: N/A;   HERNIA REPAIR     INTRAOPERATIVE TRANSESOPHAGEAL ECHOCARDIOGRAM N/A 10/29/2014   Procedure: INTRAOPERATIVE TRANSESOPHAGEAL ECHOCARDIOGRAM;  Surgeon: Kerin Perna, MD;  Location: [facility] OR;  Service: Open Heart Surgery;  Laterality: N/A;   INTRAOPERATIVE TRANSTHORACIC ECHOCARDIOGRAM N/A 02/10/2022   Procedure: INTRAOPERATIVE TRANSTHORACIC ECHOCARDIOGRAM;  Surgeon: Kathleene Hazel, MD;  Location: [facility] OR;  Service: Open Heart Surgery;  Laterality: N/A;   JOINT REPLACEMENT Right 2009   hip total   LEFT HEART CATHETERIZATION WITH CORONARY ANGIOGRAM N/A 10/26/2014   Procedure: LEFT HEART CATHETERIZATION WITH CORONARY ANGIOGRAM;  Surgeon: Lennette Bihari, MD;  Location: [facility] CATH LAB;  Service: Cardiovascular;  Laterality: N/A;   RIGHT/LEFT HEART CATH AND CORONARY/GRAFT ANGIOGRAPHY N/A 12/29/2021   Procedure: RIGHT/LEFT HEART CATH AND CORONARY/GRAFT ANGIOGRAPHY;  Surgeon: Tonny Bollman, MD;  Location: [facility] INVASIVE CV LAB;  Service: Cardiovascular;  Laterality: N/A;   TONSILLECTOMY     TOTAL HIP ARTHROPLASTY     TOTAL HIP ARTHROPLASTY Left 05/07/2016   Procedure: TOTAL HIP ARTHROPLASTY ANTERIOR APPROACH;  Surgeon: Samson Frederic, MD;  Location: [facility] ORS;  Service: Orthopedics;  Laterality: Left;   TRANSCATHETER AORTIC VALVE REPLACEMENT, TRANSFEMORAL N/A 02/10/2022   Procedure: TRANSCATHETER AORTIC VALVE REPLACEMENT, TRANSFEMORAL USING A MEDTRONIC EVOLUT FX TRANSCATHETER AORTIC VALVE.;  Surgeon: Kathleene Hazel, MD;  Location: [facility] OR;  Service: Open Heart Surgery;  Laterality: N/A;    OB History   No obstetric history on file.      Home Medications    Prior to Admission medications   Medication Sig  Start Date End Date Taking? Authorizing Provider  amoxicillin (AMOXIL) 500 MG capsule Take 1 capsule (500 mg total) by mouth 2 (two) times daily for 10 days. 01/08/24 01/18/24 Yes Leath-Warren, Sadie Haber, NP  lidocaine (XYLOCAINE) 2 % solution Gargle and spit 5 mL every 6 hours as needed for throat pain or discomfort. 01/08/24  Yes Leath-Warren, Sadie Haber, NP  albuterol (VENTOLIN HFA) 108 (90 Base) MCG/ACT inhaler Inhale 2 puffs into the lungs every 4 (four) hours as needed for wheezing or shortness of breath. 02/27/23   Particia Nearing, PA-C  allopurinol (ZYLOPRIM) 100 MG tablet Take 100 mg by mouth daily. 09/16/17   [provider]  aspirin EC 81 MG tablet Take 81 mg by mouth daily.    [provider]  atorvastatin (LIPITOR) 80 MG tablet TAKE ONE TABLET BY MOUTH ONCE DAILY AT 6 P.M. 03/27/16   Antoine Poche, MD  benzonatate (TESSALON) 200 MG capsule Take 1 capsule (200 mg total) by mouth 3 (three) times daily as needed for cough. 11/30/23   Nyoka Cowden, MD  cetirizine (ZYRTEC ALLERGY) 10 MG tablet Take 1 tablet (10 mg total) by mouth 2 (two) times daily. 10/18/23   Valentino Nose, NP  famotidine (PEPCID) 20 MG tablet Take 1 tablet (20 mg total) by mouth 2 (two) times daily. 10/18/23   Valentino Nose, NP  furosemide (LASIX) 20 MG tablet Take 1 tablet (20 mg total) by mouth daily. 03/27/16   Antoine Poche, MD  glimepiride (AMARYL) 2 MG tablet Take 1-2 mg by mouth See admin instructions. 1 mg in the morning, 2 mg at bedtime 12/12/21   [provider]  isosorbide mononitrate (IMDUR) 30 MG 24 hr tablet Take 0.5 tablets (15 mg total) by mouth daily. 12/15/17   Antoine Poche, MD  losartan (COZAAR) 25 MG tablet Take 0.5 tablets (12.5 mg total) daily by mouth. 11/05/17   Jodelle Gross, NP  metoprolol tartrate (LOPRESSOR) 25 MG tablet Take 1 tablet (25 mg total) by mouth 2 (two) times daily. 10/25/23   Antoine Poche, MD  Multiple Vitamin  (MULTIVITAMIN WITH MINERALS) TABS tablet Take 1 tablet by mouth daily.    [provider]  Omega-3 Fatty Acids (FISH OIL) 1000 MG CAPS Take 1,000 mg by mouth daily.    [provider]  pantoprazole (PROTONIX) 40 MG tablet Take 1 tablet (40 mg total) by mouth daily. Take 30-60 min before first meal of the day 11/30/23   Nyoka Cowden, MD  potassium chloride SA (K-DUR,KLOR-CON) 20 MEQ tablet Take 1 tablet (20 mEq total) by mouth daily. NEED OV. 10/17/18   Jodelle Gross, NP    Family History Family History  Problem Relation Age of Onset   Hypertension Mother    Coronary artery disease Mother        CABG   Hypertension Father    CAD Father        CABG   Coronary artery disease Brother        Stent  Social History Social History   Tobacco Use   Smoking status: Never   Smokeless tobacco: Never  Vaping Use   Vaping status: Never Used  Substance Use Topics   Alcohol use: No    Alcohol/week: 0.0 standard drinks of alcohol   Drug use: No     Allergies   Iodinated contrast media   Review of Systems Review of Systems Per HPI  Physical Exam Triage Vital Signs ED Triage Vitals  Encounter Vitals Group     BP 01/08/24 0838 (!) 156/73     Systolic BP Percentile --      Diastolic BP Percentile --      Pulse Rate 01/08/24 0838 67     Resp 01/08/24 0838 18     Temp 01/08/24 0838 98.3 F (36.8 C)     Temp Source 01/08/24 0838 Oral     SpO2 01/08/24 0838 93 %     Weight --      Height --      Head Circumference --      Peak Flow --      Pain Score 01/08/24 0839 7     Pain Loc --      Pain Education --      Exclude from Growth Chart --    No data found.  Updated Vital Signs BP (!) 156/73 (BP Location: Right Arm)   Pulse 67   Temp 98.3 F (36.8 C) (Oral)   Resp 18   SpO2 93%   Visual Acuity Right Eye Distance:   Left Eye Distance:   Bilateral Distance:    Right Eye Near:   Left Eye Near:    Bilateral Near:     Physical  Exam Vitals and nursing note reviewed.  Constitutional:      General: She is not in acute distress.    Appearance: Normal appearance.  HENT:     Head: Normocephalic.     Right Ear: Tympanic membrane, ear canal and external ear normal.     Left Ear: Tympanic membrane, ear canal and external ear normal.     Nose: Nose normal.     Mouth/Throat:     Lips: Pink.     Mouth: Mucous membranes are moist.     Pharynx: Uvula midline. Pharyngeal swelling, posterior oropharyngeal erythema and postnasal drip present. No oropharyngeal exudate or uvula swelling.     Tonsils: 1+ on the right. 1+ on the left.  Eyes:     Extraocular Movements: Extraocular movements intact.     Conjunctiva/sclera: Conjunctivae normal.     Pupils: Pupils are equal, round, and reactive to light.  Cardiovascular:     Rate and Rhythm: Normal rate and regular rhythm.     Pulses: Normal pulses.     Heart sounds: Normal heart sounds.  Pulmonary:     Effort: Pulmonary effort is normal. No respiratory distress.     Breath sounds: Normal breath sounds. No stridor. No wheezing, rhonchi or rales.  Abdominal:     General: Bowel sounds are normal.     Palpations: Abdomen is soft.     Tenderness: There is no abdominal tenderness.  Musculoskeletal:     Cervical back: Normal range of motion.  Lymphadenopathy:     Cervical: No cervical adenopathy.  Skin:    General: Skin is warm and dry.  Neurological:     General: No focal deficit present.     Mental Status: She is alert and oriented to person, place, and time.  Psychiatric:        Mood and Affect: Mood normal.        Behavior: Behavior normal.      UC Treatments / Results  Labs (all labs ordered are listed, but only abnormal results are displayed) Labs Reviewed  POCT RAPID STREP A (OFFICE) - Abnormal; Notable for the following components:      Result Value   Rapid Strep A Screen Positive (*)    All other components within normal limits    EKG   Radiology No  results found.  Procedures Procedures (including critical care time)  Medications Ordered in UC Medications - No data to display  Initial Impression / Assessment and Plan / UC Course  I have reviewed the triage vital signs and the nursing notes.  Pertinent labs & imaging results that were available during my care of the patient were reviewed by me and considered in my medical decision making (see chart for details).  Rapid strep test was positive.  Will treat patient with amoxicillin 500 mg twice daily for the next 10 days.  Viscous lidocaine 2% also prescribed for throat pain or discomfort.  Supportive care recommendations were provided and discussed with the patient to include fluids, rest, over-the-counter analgesics, warm salt water gargles, and a soft diet.  Discussed indications for follow-up.  Patient was in agreement with this plan of care and verbalizes understanding.  All questions were answered.  Patient stable for discharge.  Final Clinical Impressions(s) / UC Diagnoses   Final diagnoses:  Streptococcal sore throat     Discharge Instructions      Your rapid strep test was positive. Take medication as prescribed. Increase fluids and allow for plenty of rest. Recommend over-the-counter Tylenol as needed for pain, fever, or general discomfort. Warm salt water gargles 3-4 times daily to help with throat pain or discomfort. Recommend a diet with soft foods to include soups, broths, puddings, yogurt, Jell-O's, or popsicles until symptoms improve. Change toothbrush after 3 days. Follow-up if symptoms do not improve.    ED Prescriptions     Medication Sig Dispense Auth. Provider   amoxicillin (AMOXIL) 500 MG capsule Take 1 capsule (500 mg total) by mouth 2 (two) times daily for 10 days. 20 capsule Leath-Warren, Sadie Haber, NP   lidocaine (XYLOCAINE) 2 % solution Gargle and spit 5 mL every 6 hours as needed for throat pain or discomfort. 100 mL Leath-Warren, Sadie Haber, NP       PDMP not reviewed this encounter.   Abran Cantor, NP 01/08/24 [phone]

## 2024-01-08 NOTE — Discharge Instructions (Signed)
Your rapid strep test was positive. Take medication as prescribed. Increase fluids and allow for plenty of rest. Recommend over-the-counter Tylenol as needed for pain, fever, or general discomfort. Warm salt water gargles 3-4 times daily to help with throat pain or discomfort. Recommend a diet with soft foods to include soups, broths, puddings, yogurt, Jell-O's, or popsicles until symptoms improve. Change toothbrush after 3 days. Follow-up if symptoms do not improve.

## 2024-01-16 NOTE — Progress Notes (Deleted)
Karina Green, female    DOB: October 02, 1956    MRN: [id number]   Brief patient profile:  65  yowf  never smoker  referred to pulmonary clinic in [place]  11/30/2023 by Dr Wyline Mood for chronic cough onset 2000 while  on ACEi  and also exposed to macrodantin  remotely though Ct 05/03/23 nl    Baseline wt = around  175    History of Present Illness  11/30/2023  Pulmonary/ 1st office eval/ Jamaurie Bernier / [facility]  Chief Complaint  Patient presents with   Cough  Dyspnea:  more trouble hauling trash up to street  x 3-4 months  Cough: most bothersome noct/ dry hack to point of urinary incontinence Sleep: bed is flat on side with 2 pillows under head  SABA use: none - tried once didn't work  02: none  Zyrtec did not help and has no nasal cc  Rec Please remember to go to the lab department   for your tests - we will call you with the results when they are available. Pantoprazole (protonix) 40 mg   Take  30-60 min before first meal of the day and Pepcid (famotidine)  20 mg after supper until return to office  Try delsym otc  2 tsp every 12 hours as long as you feel the urge to cough and supplement with Tessalon 200 mg every 6 hours as needed to completely stop all cough if possible  For drainage / throat tickle try take CHLORPHENIRAMINE  4 mg  Depomedrol 120 mg IM    01/18/2024  f/u ov/[facility]/Trever Streater re: *** maint on ***  No chief complaint on file.   Dyspnea:  *** Cough: *** Sleeping: ***   resp cc  SABA use: *** 02: ***  Lung cancer screening: ***   No obvious day to day or daytime variability or assoc excess/ purulent sputum or mucus plugs or hemoptysis or cp or chest tightness, subjective wheeze or overt sinus or hb symptoms.    Also denies any obvious fluctuation of symptoms with weather or environmental changes or other aggravating or alleviating factors except as outlined above   No unusual exposure hx or h/o childhood pna/ asthma or knowledge of premature  birth.  Current Allergies, Complete Past Medical History, Past Surgical History, Family History, and Social History were reviewed in [employer] record.  ROS  The following are not active complaints unless bolded Hoarseness, sore throat, dysphagia, dental problems, itching, sneezing,  nasal congestion or discharge of excess mucus or purulent secretions, ear ache,   fever, chills, sweats, unintended wt loss or wt gain, classically pleuritic or exertional cp,  orthopnea pnd or arm/hand swelling  or leg swelling, presyncope, palpitations, abdominal pain, anorexia, nausea, vomiting, diarrhea  or change in bowel habits or change in bladder habits, change in stools or change in urine, dysuria, hematuria,  rash, arthralgias, visual complaints, headache, numbness, weakness or ataxia or problems with walking or coordination,  change in mood or  memory.        No outpatient medications have been marked as taking for the 01/18/24 encounter (Appointment) with Nyoka Cowden, MD.   Current Facility-Administered Medications for the 01/18/24 encounter (Appointment) with Nyoka Cowden, MD  Medication   sodium chloride flush (NS) 0.9 % injection 3 mL            Past Medical History:  Diagnosis Date   Arthritis    Back pain    CAD (coronary artery disease),  native coronary artery-70% LM stenosis 10/25/2014   Cough last 30 yrs    nonproductive, no fevers   DM type 2 (diabetes mellitus, type 2) (HCC) 10/25/2014   GERD (gastroesophageal reflux disease)    Gout    Hyperlipidemia    Hypertension    S/P TAVR (transcatheter aortic valve replacement) 02/10/2022   s/p TAVR with 29 mm Medtronic Evolut Fx via the TF approach by Dr. Clifton James & Dr. Laneta Simmers   Severe aortic stenosis       Objective:    Wt Readings from Last 3 Encounters:  11/30/23 223 lb (101.2 kg)  10/26/23 224 lb 3.2 oz (101.7 kg)  02/10/23 222 lb (100.7 kg)      Vital signs reviewed  01/18/2024  - Note at rest  02 sats  ***% on ***   General appearance:    ***  2/6 sem ***   I personally reviewed images and agree with radiology impression as follows:   Chest CT w/o contrast 05/03/23  stable 10 mm left upper lobe pulmonary nodule, safely considered benign given its stability since prior examination of 2016, when accounting for changes in slice selection.        Assessment

## 2024-01-18 ENCOUNTER — Ambulatory Visit: Payer: Medicare HMO | Admitting: Internal Medicine

## 2024-01-18 ENCOUNTER — Encounter: Payer: Self-pay | Admitting: Internal Medicine

## 2024-01-21 ENCOUNTER — Ambulatory Visit
Admission: EM | Admit: 2024-01-21 | Discharge: 2024-01-21 | Disposition: A | Discharge status: Home / Self Care | Payer: Medicare HMO | Attending: Nurse Practitioner | Admitting: Nurse Practitioner

## 2024-01-21 DIAGNOSIS — N3000 Acute cystitis without hematuria: Secondary | ICD-10-CM | POA: Diagnosis not present

## 2024-01-21 LAB — POCT URINALYSIS DIP (MANUAL ENTRY)
Bilirubin, UA: NEGATIVE
Glucose, UA: NEGATIVE mg/dL
Ketones, POC UA: NEGATIVE mg/dL
Nitrite, UA: POSITIVE — AB
Protein Ur, POC: >=300 mg/dL — AB
Spec Grav, UA: >=1.03 — AB
Urobilinogen, UA: 0.2 U/dL
pH, UA: 5.5

## 2024-01-21 MED ORDER — SULFAMETHOXAZOLE-TRIMETHOPRIM 800-160 MG PO TABS: 1.0000 | ORAL_TABLET | Freq: Two times a day (BID) | ORAL | 0 refills | Status: AC

## 2024-01-21 NOTE — ED Triage Notes (Signed)
Pt reports she has some frequent urination, not completing her stream, low abdominal pain, painful/burning with urination, and cloudy urine x 3 days

## 2024-01-21 NOTE — ED Provider Notes (Signed)
[facility]    CSN: [id number] Arrival date & time: 01/21/24  1554      History   Chief Complaint Chief Complaint  Patient presents with   Urinary Tract Infection    HPI Karina Green is a 68 y.o. female.    Urinary Tract Infection   Past Medical History:  Diagnosis Date   Arthritis    Back pain    CAD (coronary artery disease), native coronary artery-70% LM stenosis 10/25/2014   Cough last 30 yrs    nonproductive, no fevers   DM type 2 (diabetes mellitus, type 2) (HCC) 10/25/2014   GERD (gastroesophageal reflux disease)    Gout    Hyperlipidemia    Hypertension    S/P TAVR (transcatheter aortic valve replacement) 02/10/2022   s/p TAVR with 29 mm Medtronic Evolut Fx via the TF approach by Dr. Clifton James & Dr. Laneta Simmers   Severe aortic stenosis     Patient Active Problem List   Diagnosis Date Noted   Psoriasis 11/30/2023   Upper airway cough syndrome 11/30/2023   DOE (dyspnea on exertion) 11/30/2023   S/P TAVR (transcatheter aortic valve replacement) 02/10/2022   Chronic diastolic heart failure (HCC) 06/20/2015   Severe aortic stenosis 06/20/2015   CKD (chronic kidney disease), stage III (HCC) 06/20/2015   CAD (coronary artery disease), native coronary artery-70% LM stenosis 10/26/2014   DM2 (diabetes mellitus, type 2) (HCC) 10/26/2014   Essential hypertension 10/24/2014   Hypercholesteremia 10/24/2014    Past Surgical History:  Procedure Laterality Date   ABDOMINAL HYSTERECTOMY     complete   APPENDECTOMY     bladder tack     CARDIAC CATHETERIZATION  10/25/14   70% LM stensis, normal EF   CHOLECYSTECTOMY     CORONARY ARTERY BYPASS GRAFT N/A 10/29/2014   Procedure: CORONARY ARTERY BYPASS GRAFTING (CABG) TIMES TWO USING LEFT INTERNAL MAMMARY ARTERY AND RIGHT LEG GREATER SAPHENOUS VEIN HARVESTED ENDOSCOPICALLY.;  Surgeon: Kerin Perna, MD;  Location: [facility] OR;  Service: Open Heart Surgery;  Laterality: N/A;   HERNIA REPAIR     INTRAOPERATIVE  TRANSESOPHAGEAL ECHOCARDIOGRAM N/A 10/29/2014   Procedure: INTRAOPERATIVE TRANSESOPHAGEAL ECHOCARDIOGRAM;  Surgeon: Kerin Perna, MD;  Location: [facility] OR;  Service: Open Heart Surgery;  Laterality: N/A;   INTRAOPERATIVE TRANSTHORACIC ECHOCARDIOGRAM N/A 02/10/2022   Procedure: INTRAOPERATIVE TRANSTHORACIC ECHOCARDIOGRAM;  Surgeon: Kathleene Hazel, MD;  Location: [facility] OR;  Service: Open Heart Surgery;  Laterality: N/A;   JOINT REPLACEMENT Right 2009   hip total   LEFT HEART CATHETERIZATION WITH CORONARY ANGIOGRAM N/A 10/26/2014   Procedure: LEFT HEART CATHETERIZATION WITH CORONARY ANGIOGRAM;  Surgeon: Lennette Bihari, MD;  Location: [facility] CATH LAB;  Service: Cardiovascular;  Laterality: N/A;   RIGHT/LEFT HEART CATH AND CORONARY/GRAFT ANGIOGRAPHY N/A 12/29/2021   Procedure: RIGHT/LEFT HEART CATH AND CORONARY/GRAFT ANGIOGRAPHY;  Surgeon: Tonny Bollman, MD;  Location: [facility] INVASIVE CV LAB;  Service: Cardiovascular;  Laterality: N/A;   TONSILLECTOMY     TOTAL HIP ARTHROPLASTY     TOTAL HIP ARTHROPLASTY Left 05/07/2016   Procedure: TOTAL HIP ARTHROPLASTY ANTERIOR APPROACH;  Surgeon: Samson Frederic, MD;  Location: [facility] ORS;  Service: Orthopedics;  Laterality: Left;   TRANSCATHETER AORTIC VALVE REPLACEMENT, TRANSFEMORAL N/A 02/10/2022   Procedure: TRANSCATHETER AORTIC VALVE REPLACEMENT, TRANSFEMORAL USING A MEDTRONIC EVOLUT FX TRANSCATHETER AORTIC VALVE.;  Surgeon: Kathleene Hazel, MD;  Location: [facility] OR;  Service: Open Heart Surgery;  Laterality: N/A;    OB History   No obstetric history on file.  Home Medications    Prior to Admission medications   Medication Sig Start Date End Date Taking? Authorizing Provider  sulfamethoxazole-trimethoprim (BACTRIM DS) 800-160 MG tablet Take 1 tablet by mouth 2 (two) times daily for 7 days. 01/21/24 01/28/24 Yes Leath-Warren, Sadie Haber, NP  albuterol (VENTOLIN HFA) 108 (90 Base) MCG/ACT inhaler Inhale 2 puffs into the lungs every 4 (four) hours as  needed for wheezing or shortness of breath. 02/27/23   Particia Nearing, PA-C  allopurinol (ZYLOPRIM) 100 MG tablet Take 100 mg by mouth daily. 09/16/17   [provider]  aspirin EC 81 MG tablet Take 81 mg by mouth daily.    [provider]  atorvastatin (LIPITOR) 80 MG tablet TAKE ONE TABLET BY MOUTH ONCE DAILY AT 6 P.M. 03/27/16   Antoine Poche, MD  benzonatate (TESSALON) 200 MG capsule Take 1 capsule (200 mg total) by mouth 3 (three) times daily as needed for cough. 11/30/23   Nyoka Cowden, MD  cetirizine (ZYRTEC ALLERGY) 10 MG tablet Take 1 tablet (10 mg total) by mouth 2 (two) times daily. 10/18/23   Valentino Nose, NP  famotidine (PEPCID) 20 MG tablet Take 1 tablet (20 mg total) by mouth 2 (two) times daily. 10/18/23   Valentino Nose, NP  furosemide (LASIX) 20 MG tablet Take 1 tablet (20 mg total) by mouth daily. 03/27/16   Antoine Poche, MD  glimepiride (AMARYL) 2 MG tablet Take 1-2 mg by mouth See admin instructions. 1 mg in the morning, 2 mg at bedtime 12/12/21   [provider]  isosorbide mononitrate (IMDUR) 30 MG 24 hr tablet Take 0.5 tablets (15 mg total) by mouth daily. 12/15/17   Antoine Poche, MD  lidocaine (XYLOCAINE) 2 % solution Gargle and spit 5 mL every 6 hours as needed for throat pain or discomfort. 01/08/24   Leath-Warren, Sadie Haber, NP  losartan (COZAAR) 25 MG tablet Take 0.5 tablets (12.5 mg total) daily by mouth. 11/05/17   Jodelle Gross, NP  metoprolol tartrate (LOPRESSOR) 25 MG tablet Take 1 tablet (25 mg total) by mouth 2 (two) times daily. 10/25/23   Antoine Poche, MD  Multiple Vitamin (MULTIVITAMIN WITH MINERALS) TABS tablet Take 1 tablet by mouth daily.    [provider]  Omega-3 Fatty Acids (FISH OIL) 1000 MG CAPS Take 1,000 mg by mouth daily.    [provider]  pantoprazole (PROTONIX) 40 MG tablet Take 1 tablet (40 mg total) by mouth daily. Take 30-60 min before first meal of the day  11/30/23   Nyoka Cowden, MD  potassium chloride SA (K-DUR,KLOR-CON) 20 MEQ tablet Take 1 tablet (20 mEq total) by mouth daily. NEED OV. 10/17/18   Jodelle Gross, NP    Family History Family History  Problem Relation Age of Onset   Hypertension Mother    Coronary artery disease Mother        CABG   Hypertension Father    CAD Father        CABG   Coronary artery disease Brother        Stent    Social History Social History   Tobacco Use   Smoking status: Never   Smokeless tobacco: Never  Vaping Use   Vaping status: Never Used  Substance Use Topics   Alcohol use: No    Alcohol/week: 0.0 standard drinks of alcohol   Drug use: No     Allergies   Iodinated contrast media   Review  of Systems Review of Systems   Physical Exam Triage Vital Signs ED Triage Vitals [01/21/24 1657]  Encounter Vitals Group     BP (!) 158/84     Systolic BP Percentile      Diastolic BP Percentile      Pulse Rate 72     Resp 14     Temp 97.9 F (36.6 C)     Temp Source Oral     SpO2 95 %     Weight      Height      Head Circumference      Peak Flow      Pain Score 0     Pain Loc      Pain Education      Exclude from Growth Chart    No data found.  Updated Vital Signs BP (!) 158/84 (BP Location: Right Arm)   Pulse 72   Temp 97.9 F (36.6 C) (Oral)   Resp 14   SpO2 95%   Visual Acuity Right Eye Distance:   Left Eye Distance:   Bilateral Distance:    Right Eye Near:   Left Eye Near:    Bilateral Near:     Physical Exam Vitals and nursing note reviewed.  Constitutional:      General: She is not in acute distress.    Appearance: Normal appearance.  HENT:     Head: Normocephalic.  Eyes:     Extraocular Movements: Extraocular movements intact.     Conjunctiva/sclera: Conjunctivae normal.     Pupils: Pupils are equal, round, and reactive to light.  Cardiovascular:     Rate and Rhythm: Normal rate and regular rhythm.     Pulses: Normal pulses.     Heart  sounds: Normal heart sounds.  Pulmonary:     Effort: Pulmonary effort is normal. No respiratory distress.     Breath sounds: Normal breath sounds. No stridor. No wheezing, rhonchi or rales.  Abdominal:     General: Bowel sounds are normal.     Palpations: Abdomen is soft.     Tenderness: There is no abdominal tenderness. There is right CVA tenderness. There is no left CVA tenderness.  Musculoskeletal:     Cervical back: Normal range of motion.  Skin:    General: Skin is warm and dry.  Neurological:     General: No focal deficit present.     Mental Status: She is alert and oriented to person, place, and time.  Psychiatric:        Mood and Affect: Mood normal.        Behavior: Behavior normal.      UC Treatments / Results  Labs (all labs ordered are listed, but only abnormal results are displayed) Labs Reviewed  POCT URINALYSIS DIP (MANUAL ENTRY) - Abnormal; Notable for the following components:      Result Value   Clarity, UA cloudy (*)    Spec Grav, UA >=1.030 (*)    Blood, UA large (*)    Protein Ur, POC >=300 (*)    Nitrite, UA Positive (*)    Leukocytes, UA Large (3+) (*)    All other components within normal limits  URINE CULTURE    EKG   Radiology No results found.  Procedures Procedures (including critical care time)  Medications Ordered in UC Medications - No data to display  Initial Impression / Assessment and Plan / UC Course  I have reviewed the triage vital signs and the nursing notes.  Pertinent labs & imaging results that were available during my care of the patient were reviewed by me and considered in my medical decision making (see chart for details).  On exam, lung sounds are clear throughout, room air sats at 95%.  On exam, patient with right CVA tenderness, urinalysis is positive for leukocytes, nitrates, and blood, consistent with UTI, concerning for pyelonephritis.  Will treat empirically with Bactrim DS 800/160 mg tablets.  Reviewed  patient's most recent lab work, GFR greater than 60, no dose adjustment needed.  Supportive care recommendations were provided and discussed with the patient to include fluids, rest, over-the-counter analgesics, developing a toileting schedule, and avoiding caffeine.  Patient was given strict ER follow-up precautions.  Patient advised to follow-up with her PCP if symptoms continue to recur.  Patient was in agreement with this plan of care and verbalizes understanding.  All questions were answered.  Patient stable for discharge.  Final Clinical Impressions(s) / UC Diagnoses   Final diagnoses:  Acute cystitis without hematuria     Discharge Instructions      -Your urinalysis shows that you do have a urinary tract infection.  A urine culture has been ordered.  You will be contacted if the results of the urine culture show that the medication prescribed today needs to be changed.  You also have access to the results via MyChart. -Take medications as prescribed. -Increase fluids. -Ibuprofen or Tylenol for pain, fever, or general discomfort. -Develop a toileting schedule that will allow you to toilet at least every 2 hours. -Avoid caffeine to include tea, soda, and coffee while symptoms persist.. -If sexually active, void at least 15 to 20 minutes after sexual intercourse. -Follow-up in the emergency department if you develop fever, chills, worsening abdominal pain, or other concerns.  -If you begin to develop reoccurring urinary tract infections, please follow-up with your primary care physician for further evaluation. Follow-up as needed.    ED Prescriptions     Medication Sig Dispense Auth. Provider   sulfamethoxazole-trimethoprim (BACTRIM DS) 800-160 MG tablet Take 1 tablet by mouth 2 (two) times daily for 7 days. 14 tablet Leath-Warren, Sadie Haber, NP      PDMP not reviewed this encounter.   Abran Cantor, NP 01/21/24 1735

## 2024-01-21 NOTE — Discharge Instructions (Addendum)
-  Your urinalysis shows that you do have a urinary tract infection.  A urine culture has been ordered.  You will be contacted if the results of the urine culture show that the medication prescribed today needs to be changed.  You also have access to the results via MyChart. -Take medications as prescribed. -Increase fluids. -Ibuprofen or Tylenol for pain, fever, or general discomfort. -Develop a toileting schedule that will allow you to toilet at least every 2 hours. -Avoid caffeine to include tea, soda, and coffee while symptoms persist.. -If sexually active, void at least 15 to 20 minutes after sexual intercourse. -Follow-up in the emergency department if you develop fever, chills, worsening abdominal pain, or other concerns.  -If you begin to develop reoccurring urinary tract infections, please follow-up with your primary care physician for further evaluation. Follow-up as needed.

## 2024-01-23 LAB — URINE CULTURE: Culture: >=100000 — AB

## 2024-01-24 ENCOUNTER — Telehealth (HOSPITAL_COMMUNITY): Payer: Self-pay

## 2024-01-24 MED ORDER — CIPROFLOXACIN HCL 250 MG PO TABS: 250.0000 mg | ORAL_TABLET | Freq: Two times a day (BID) | ORAL | 0 refills | Status: AC

## 2024-01-24 NOTE — Telephone Encounter (Signed)
Per VO by P. Banister, MD, "cipro 250 mg BID for 7 days." Reviewed with patient, verified pharmacy, prescription sent

## 2024-02-01 ENCOUNTER — Ambulatory Visit: Payer: Medicare HMO | Admitting: Nurse Practitioner

## 2024-02-02 ENCOUNTER — Telehealth: Payer: Self-pay | Admitting: Internal Medicine

## 2024-02-02 NOTE — Telephone Encounter (Signed)
Spoke with patient regarding the 02/22/24 10:45 am appointment with Dr. Zada Girt is out of the office and appointment rescheduled to Tuesday 02/29/24 at 9:15 am--will mail information to patient and she voiced her understanding

## 2024-02-22 ENCOUNTER — Ambulatory Visit: Payer: Medicare HMO | Admitting: Internal Medicine

## 2024-02-27 NOTE — Progress Notes (Unsigned)
 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-4 months  Cough: most bothersome noct/ dry hack to point of urinary incontinence Sleep: bed is flat on side with 2 pillows under head  SABA use: none - tried once didn't work  02: none  Zyrtec did not help and has no nasal cc  Rec  Labs  Eos 0.3  Pantoprazole (protonix) 40 mg   Take  30-60 min before first meal of the day and Pepcid (famotidine)  20 mg after supper until return to office   GERD diet reviewed, bed blocks rec  Try delsym otc  2 tsp every 12 hours as long as you feel the urge to cough and supplement with Tessalon 200 mg every 6 hours as needed to completely stop all cough if possible  For drainage / throat tickle try take CHLORPHENIRAMINE  4 mg  ("Allergy Relief" 4mg   at [facility] should be easiest to find in the blue box usually on bottom shelf)  take one every 4 hours as needed   Depomedrol 120 mg IM      02/29/2024  f/u ov/[place] office/Mylo Choi re: cough 75%  maint on pepcid prn / overt hb off ppi and just on pepcid  Chief Complaint  Patient presents with   Follow-up    Coughing has gotten better since last visit   Dyspnea:  better but not hauling trash Cough: "it's my allergies"  Sleeping: able to lie flat 2 pillows s  resp cc  SABA use: once a month  02: none  Overt hb off ppi (3 m rx ran out)    No obvious day to day or daytime variability or assoc excess/ purulent sputum or mucus plugs or hemoptysis or cp or chest tightness, subjective  wheeze or overt   hb symptoms.    Also denies any obvious fluctuation of symptoms with weather or environmental changes or other aggravating or alleviating factors except as outlined above   No unusual exposure hx or h/o childhood pna/ asthma or knowledge of premature birth.  Current Allergies, Complete Past Medical History, Past Surgical History, Family History, and Social History were reviewed in [employer] record.  ROS  The following are not active complaints unless bolded Hoarseness, sore throat, dysphagia, dental problems, itching, sneezing,  nasal congestion or discharge of excess mucus or purulent secretions, ear ache,   fever, chills, sweats, unintended wt loss or wt gain, classically pleuritic or exertional cp,  orthopnea pnd or arm/hand swelling  or leg swelling, presyncope, palpitations, abdominal pain, anorexia, nausea, vomiting, diarrhea  or change in bowel habits or change in bladder habits, change in stools or change in urine, dysuria, hematuria,  rash, arthralgias, visual complaints, headache, numbness, weakness or ataxia or problems with walking or coordination,  change in mood or  memory.        Current Meds  Medication Sig   albuterol (VENTOLIN HFA)  108 (90 Base) MCG/ACT inhaler Inhale 2 puffs into the lungs every 4 (four) hours as needed for wheezing or shortness of breath.   allopurinol (ZYLOPRIM) 100 MG tablet Take 100 mg by mouth daily.   aspirin EC 81 MG tablet Take 81 mg by mouth daily.   atorvastatin (LIPITOR) 80 MG tablet TAKE ONE TABLET BY MOUTH ONCE DAILY AT 6 P.M.   cetirizine (ZYRTEC ALLERGY) 10 MG tablet Take 1 tablet (10 mg total) by mouth 2 (two) times daily.   famotidine (PEPCID) 20 MG tablet Take 1 tablet (20 mg total) by mouth 2 (two) times daily.   furosemide (LASIX) 20 MG tablet Take 1 tablet (20 mg total) by mouth daily.   glimepiride (AMARYL) 2 MG tablet Take 1-2 mg by mouth See admin instructions. 1 mg in the morning, 2 mg at  bedtime   isosorbide mononitrate (IMDUR) 30 MG 24 hr tablet Take 0.5 tablets (15 mg total) by mouth daily.   lidocaine (XYLOCAINE) 2 % solution Gargle and spit 5 mL every 6 hours as needed for throat pain or discomfort.   losartan (COZAAR) 25 MG tablet Take 0.5 tablets (12.5 mg total) daily by mouth.   metoprolol tartrate (LOPRESSOR) 25 MG tablet Take 1 tablet (25 mg total) by mouth 2 (two) times daily.   Multiple Vitamin (MULTIVITAMIN WITH MINERALS) TABS tablet Take 1 tablet by mouth daily.   Omega-3 Fatty Acids (FISH OIL) 1000 MG CAPS Take 1,000 mg by mouth daily.   pantoprazole (PROTONIX) 40 MG tablet Take 1 tablet (40 mg total) by mouth daily. Take 30-60 min before first meal of the day   potassium chloride SA (K-DUR,KLOR-CON) 20 MEQ tablet Take 1 tablet (20 mEq total) by mouth daily. NEED OV.   [DISCONTINUED] benzonatate (TESSALON) 200 MG capsule Take 1 capsule (200 mg total) by mouth 3 (three) times daily as needed for cough.   Current Facility-Administered Medications for the 02/29/24 encounter (Office Visit) with Nyoka Cowden, MD  Medication   sodium chloride flush (NS) 0.9 % injection 3 mL        Past Medical History:  Diagnosis Date   Arthritis    Back pain    CAD (coronary artery disease), native coronary artery-70% LM stenosis 10/25/2014   Cough last 30 yrs    nonproductive, no fevers   DM type 2 (diabetes mellitus, type 2) (HCC) 10/25/2014   GERD (gastroesophageal reflux disease)    Gout    Hyperlipidemia    Hypertension    S/P TAVR (transcatheter aortic valve replacement) 02/10/2022   s/p TAVR with 29 mm Medtronic Evolut Fx via the TF approach by Dr. Clifton James & Dr. Laneta Simmers   Severe aortic stenosis       Objective:    Wts  02/29/2024        230  11/30/23 223 lb (101.2 kg)  10/26/23 224 lb 3.2 oz (101.7 kg)  02/10/23 222 lb (100.7 kg)    Vital signs reviewed  02/29/2024  - Note at rest 02 sats  95% on RA  Elevated BP notes but took meds just prior to ov and  monitors at home  General appearance:    somber amb MO (by bmi) wf occ throat clearing    HEENT : Oropharynx  clear      Nasal turbinates mild non-specific edema    NECK :  without  apparent JVD/ palpable Nodes/TM    LUNGS: no acc muscle use,  Nl contour chest which is clear to A  and P bilaterally without cough on insp or exp maneuvers   CV:  RRR  no s3 or  2/6 SEM s  increase in P2, and no edema   ABD:  soft and nontender   MS:  Gait slt  waddle pattern  ext warm without deformities Or obvious joint restrictions  calf tenderness, cyanosis or clubbing    SKIN: warm and dry without lesions    NEURO:  alert, approp, nl sensorium with  no motor or cerebellar deficits apparent.         I personally reviewed images and agree with radiology impression as follows:  Chest CT w/o contrast 05/03/23  stable 10 mm left upper lobe pulmonary nodule, safely considered benign given its stability since prior examination of 2016     Assessment

## 2024-02-29 ENCOUNTER — Ambulatory Visit: Payer: Medicare HMO | Admitting: Internal Medicine

## 2024-02-29 ENCOUNTER — Encounter: Payer: Self-pay | Admitting: Internal Medicine

## 2024-02-29 DIAGNOSIS — R058 Other specified cough: Secondary | ICD-10-CM

## 2024-02-29 MED ORDER — PANTOPRAZOLE SODIUM 40 MG PO TBEC: 40.0000 mg | DELAYED_RELEASE_TABLET | Freq: Every day | ORAL | 3 refills | Status: AC

## 2024-02-29 MED ORDER — FAMOTIDINE 20 MG PO TABS: ORAL_TABLET | ORAL | 11 refills | Status: AC

## 2024-02-29 NOTE — Assessment & Plan Note (Signed)
 Onset 2000 while on ACEi and no better p stopped it  - CT chest 05/03/23 nl  - Allergy screen 11/30/2023 >  Eos 0.3  - 11/30/2023 rx cyclical cough with max gerd rx/ 1st gen H1 blockers per guidelines  and deslym suppemented with tessalon plus depomedrol 120 mg >  much better for 3-5 days   Cough much improved s evidence of asthma or any pulmonary problem but clearly prone to reflux which makes cough worse which makes reflux worse in an obvious cyclical manner  The underlying trigger well may be allergic based on response to depomedrol, thought onset relatively late in life is unusual s some new change in lifestyle or exp.  Rec 1) continue gerd rx/ diet/ wt loss 2) zyrtec 10 mg every day prn with allergy f/u prn  3) continue off ACEi   F/u pulmonary clinic is prn   Each maintenance medication was reviewed in detail including emphasizing most importantly the difference between maintenance and prns and under what circumstances the prns are to be triggered using an action plan format where appropriate.  Total time for H and P, chart review, counseling, reviewing hfa device(s) and generating customized AVS unique to this office visit / same day charting = 30 min final summary f/u ov

## 2024-02-29 NOTE — Patient Instructions (Addendum)
 Pantoprazole (protonix) 40 mg   Take  30-60 min before first meal of the day and Pepcid (famotidine)  20 mg after supper until you see your PCP    Best allergy medication > zyrtec 10 mg one daily (take in am unless makes you sleepy)    Next would be to refer to allergy [place]     If you are satisfied with your treatment plan,  let your doctor know and he/she can either refill your medications or you can return here when your prescription runs out.     If in any way you are not 100% satisfied,  please tell us.  If 100% better, tell your friends!  Pulmonary follow up is as needed

## 2024-03-21 ENCOUNTER — Ambulatory Visit: Payer: Medicare HMO | Attending: Nurse Practitioner | Admitting: Nurse Practitioner

## 2024-03-21 ENCOUNTER — Encounter: Payer: Self-pay | Admitting: Nurse Practitioner

## 2024-03-21 VITALS — BP 123/80 | HR 57 | Ht 63.0 in | Wt 225.8 lb

## 2024-03-21 DIAGNOSIS — Z952 Presence of prosthetic heart valve: Secondary | ICD-10-CM

## 2024-03-21 DIAGNOSIS — R0609 Other forms of dyspnea: Secondary | ICD-10-CM

## 2024-03-21 DIAGNOSIS — R053 Chronic cough: Secondary | ICD-10-CM | POA: Diagnosis not present

## 2024-03-21 DIAGNOSIS — I10 Essential (primary) hypertension: Secondary | ICD-10-CM

## 2024-03-21 DIAGNOSIS — E785 Hyperlipidemia, unspecified: Secondary | ICD-10-CM

## 2024-03-21 DIAGNOSIS — I2510 Atherosclerotic heart disease of native coronary artery without angina pectoris: Secondary | ICD-10-CM | POA: Diagnosis not present

## 2024-03-21 NOTE — Progress Notes (Unsigned)
 Cardiology Office Note:  .   Date:  03/21/2024 ID:  Karina Green, DOB Feb 02, 1956, MRN [id number] PCP: Assunta Found, MD  [facility] Providers Cardiologist:  Dina Rich, MD    History of Present Illness: .   Karina Green is a 68 y.o. female with a PMH of CAD, s/p CABG in 2015, hyperlipidemia, hypertension, aortic stenosis, s/p TAVR in 2023, palpitations, and history of lung nodule, who presents today for 6 month follow-up.   Last seen by Dr. Dina Rich on February 10, 2023.  She was overall doing well at that time.  Her EKG revealed sinus bradycardia with heart rate in the low 50s, she is asymptomatic with this.  10/26/2023 - Today she presents for scheduled 6 month follow-up. She endorses shortness of breath with exertion since August 2024, notices this when taking garbage into the house and has a little incline up her driveway. Symptoms are stable over time. During interview, patient is noted to have raspy, strong/nonproductive cough that is quite frequent in occurrence. Says this has been chronic for 20 years and is like this during the day. Denies ever having Covid. Was on Ace-inhibitor and was taken off, cough never went away. Remote hx of smoking for only 3 months. Does have positive hx of second hand tobacco exposure, says her father smoked her whole life. Denies any chest pain,  palpitations, syncope, presyncope, dizziness, orthopnea, PND, swelling or significant weight changes, acute bleeding, or claudication.  03/21/2024 -presents today for follow-up.  Doing well per her report.  Breathing is still stable.  She says her coughing has improved.  Does not check her BP at home. Denies any chest pain, palpitations, syncope, presyncope, dizziness, orthopnea, PND, swelling or significant weight changes, acute bleeding, or claudication.  ROS: Negative. See HPI.   Studies Reviewed: Marland Kitchen    EKG: EKG Interpretation Date/Time:  Tuesday March 21 2024 11:34:14 EDT Ventricular Rate:   59 PR Interval:  176 QRS Duration:  78 QT Interval:  438 QTC Calculation: 433 R Axis:   56  Text Interpretation: Sinus bradycardia ST & T wave abnormality, consider lateral ischemia When compared with ECG of 11-Feb-2022 05:15, T wave amplitude has increased in Inferior leads T wave inversion less evident in Anterolateral leads QT has shortened Confirmed by Sharlene Dory [phone]) on 03/21/2024 11:47:57 AM   Echo 01/2023:  1. Left ventricular ejection fraction, by estimation, is 60 to 65%. The  left ventricle has normal function. The left ventricle has no regional  wall motion abnormalities. Left ventricular diastolic parameters are  consistent with Grade II diastolic  dysfunction (pseudonormalization). The average left ventricular global  longitudinal strain is -23.8 %. The global longitudinal strain is normal.   2. Right ventricular systolic function is normal. The right ventricular  size is normal. There is normal pulmonary artery systolic pressure. The  estimated right ventricular systolic pressure is 27.2 mmHg.   3. The mitral valve is degenerative. Mild mitral valve regurgitation.   4. The aortic valve has been repaired/replaced. Aortic valve  regurgitation is not visualized. There is a 29 mm Medtronic Evolut FX TAVR  valve present in the aortic position. Aortic valve mean gradient measures  5.5 mmHg.   5. The inferior vena cava is normal in size with greater than 50%  respiratory variability, suggesting right atrial pressure of 3 mmHg.   Comparison(s): No significant change from prior study. Prior images  reviewed side by side.  CT Coronary 12/2021:  IMPRESSION: 1. Tri leaflet AV  with calcium score 1705   2.  Annular area of 400 mm2 suitable for a 23 mm Sapien 3   3. Optimum angiographic angle for deployment LAO 9 Cranial 4 degrees   4.  Normal ascending thoracic aorta 3.5 cm   5.  Occluded SVG to OM and patent LIMA to LAD   6.  Membranous Septum length 6.3  mm  Right/left heart cath 12/2021:    Prox RCA lesion is 40% stenosed.   Mid RCA lesion is 40% stenosed.   Dist LM lesion is 70% stenosed.   Ost Cx lesion is 50% stenosed.   Ost LAD to Prox LAD lesion is 75% stenosed.   Origin to Prox Graft lesion is 100% stenosed.   1.  Moderately severe 70% distal left main stenosis 2.  Severe 75% proximal LAD stenosis 3.  Moderate 50% proximal circumflex stenosis 4.  Patent RCA with mild plaquing, dominant vessel 5. Continued patency of the LIMA-LAD 6. Chronic occlusion of the SVG-OM 7.  Severe calcific aortic stenosis with peak to peak gradient of 66 mmHg. 8.  Mildly elevated right heart pressures, suspect secondary to left heart disease   Recommendations: Medical therapy for CAD, TAVR evaluation for treatment of severe aortic stenosis.  Physical Exam:   VS:  BP 123/80 (BP Location: Right Arm, Patient Position: Sitting, Cuff Size: Normal)   Pulse (!) 57   Ht 5\' 3"  (1.6 m)   Wt 225 lb 12.8 oz (102.4 kg)   SpO2 94%   BMI 40.00 kg/m    Wt Readings from Last 3 Encounters:  03/21/24 225 lb 12.8 oz (102.4 kg)  02/29/24 230 lb 9.6 oz (104.6 kg)  11/30/23 223 lb (101.2 kg)    GEN: Obese, 68 y.o. female in no acute distress NECK: No JVD; No carotid bruits CARDIAC: S1/S2, RRR, Grade 1/6 murmur, no rubs, no gallops RESPIRATORY:  Clear and diminished to auscultation without rales, wheezing or rhonchi; strong/raspy/nonproductive cough during patient interview EXTREMITIES:  No edema; No deformity   ASSESSMENT AND PLAN: .    CAD, s/p CABG Stable with no anginal symptoms. No indication for ischemic evaluation.  Dyspnea on exertion as noted below-see #5 does not sound cardiac in etiology.  See below.  Continue current medication regimen. Heart healthy diet and regular cardiovascular exercise encouraged. Care and ED precautions discussed.  HTN Blood pressure elevated on arrival, BP at recheck WNL.  Discussed SBP goal is less than 140. Discussed to  monitor BP at home at least 2 hours after medications and sitting for 5-10 minutes.  Given BP log and salty 6. Heart healthy diet and regular cardiovascular exercise encouraged.  She will notify our office if her SBP is consistently greater than 140.  She verbalized understanding.  HLD Previous LDL in December 2023 was 91.  She is not currently at goal.  Will request labs from PCP's office.  Zetia in the past was too expensive.  If most recent LDL is still not at goal, plan to discuss PCSK9i and/or possibly nexlizet. Heart healthy diet and regular cardiovascular exercise encouraged.   Aortic valve stenosis, s/p TAVR Continues to admit to some shortness of breath with exertion that has been ongoing and stable since August 2024.  Reviewed echocardiogram from February 2024 with her that was reassuring.  Normal valvular function noted.  SBE prophylaxis discussed.  She verbalized understanding.  No medication changes at this time.  DOE, chronic cough She admits to chronic and stable dyspnea on exertion.  She says  her cough has improved.  Denies any recent tobacco use but does admit to significant secondhand tobacco exposure.  No longer on ACE inhibitor and says still having chronic cough.  Continue to follow-up with pulmonology and PCP.  Dispo: Follow-up with Dr. Dina Rich or APP in 6 months or sooner if anything changes.  Signed, Sharlene Dory, NP

## 2024-03-21 NOTE — Patient Instructions (Addendum)

## 2024-04-13 ENCOUNTER — Other Ambulatory Visit: Payer: Self-pay

## 2024-04-13 ENCOUNTER — Ambulatory Visit
Admission: EM | Admit: 2024-04-13 | Discharge: 2024-04-13 | Disposition: A | Discharge status: Home / Self Care | Attending: Nurse Practitioner | Admitting: Nurse Practitioner

## 2024-04-13 ENCOUNTER — Encounter: Payer: Self-pay | Admitting: Emergency Medicine

## 2024-04-13 DIAGNOSIS — M25512 Pain in left shoulder: Secondary | ICD-10-CM | POA: Diagnosis not present

## 2024-04-13 DIAGNOSIS — M542 Cervicalgia: Secondary | ICD-10-CM

## 2024-04-13 MED ORDER — TIZANIDINE HCL 4 MG PO TABS: 4.0000 mg | ORAL_TABLET | Freq: Three times a day (TID) | ORAL | 0 refills | Status: DC | PRN

## 2024-04-13 NOTE — Discharge Instructions (Addendum)
 Take medication as prescribed.  As discussed, the medication may cause drowsiness.  No driving, operate heavy equipment, or drinking alcohol  while taking the medication. Continue over-the-counter Tylenol  as needed for pain or discomfort. Gentle range of motion exercises to help decrease recovery time.  I provided exercises for you to perform at least 2-3 times daily. Recommend the use of ice or heat.  Apply ice for pain or swelling, heat for spasm or stiffness.  Apply for 20 minutes, remove for 1 hour, repeat as needed. As discussed, if symptoms fail to improve with this treatment, please follow-up with your primary care physician for further evaluation. Go to the emergency department immediately if you experience worsening neck pain, become unable to move the head or neck, or develop numbness, tingling, or other concerns. Follow-up as needed.

## 2024-04-13 NOTE — ED Triage Notes (Addendum)
 Pt reports right sided neck pain x4 days. Pt reports pain is now at base of head and left side of neck. Denies any known injury.

## 2024-04-13 NOTE — ED Provider Notes (Signed)
 [facility]    CSN: [id number] Arrival date & time: 04/13/24  9528      History   Chief Complaint Chief Complaint  Patient presents with   Neck Pain    HPI Karina Green is a 68 y.o. female.   The history is provided by the patient.   Patient presents with a 4-day history of neck pain.  Patient states that the pain started on 1 side of her neck and now has transition to the opposite side.  She states pain is currently on both sides, states that she can also feel it in the left shoulder.  She states that the base of her neck and left shoulder feel "sore" to touch.  Patient denies injury or trauma, numbness, tingling, radiation of pain, difficulty swallowing, or headache..  She states that she thought maybe her symptoms started because she was coughing so much because of seasonal allergies.  She also thought she had a "crick" in her neck.  Patient states that she has been using heat and taking Aleve for her symptoms with minimal relief.  Reports prior history of arthritis.  Patient with history of diabetes, she is unable to recall her most recent A1c.  Past Medical History:  Diagnosis Date   Arthritis    Back pain    CAD (coronary artery disease), native coronary artery-70% LM stenosis 10/25/2014   Cough last 30 yrs    nonproductive, no fevers   DM type 2 (diabetes mellitus, type 2) (HCC) 10/25/2014   GERD (gastroesophageal reflux disease)    Gout    Hyperlipidemia    Hypertension    S/P TAVR (transcatheter aortic valve replacement) 02/10/2022   s/p TAVR with 29 mm Medtronic Evolut Fx via the TF approach by Dr. Abel Hoe & Dr. Sherene Dilling   Severe aortic stenosis     Patient Active Problem List   Diagnosis Date Noted   Psoriasis 11/30/2023   Upper airway cough syndrome 11/30/2023   DOE (dyspnea on exertion) 11/30/2023   S/P TAVR (transcatheter aortic valve replacement) 02/10/2022   Chronic diastolic heart failure (HCC) 06/20/2015   Severe aortic stenosis 06/20/2015    CKD (chronic kidney disease), stage III (HCC) 06/20/2015   CAD (coronary artery disease), native coronary artery-70% LM stenosis 10/26/2014   DM2 (diabetes mellitus, type 2) (HCC) 10/26/2014   Essential hypertension 10/24/2014   Hypercholesteremia 10/24/2014    Past Surgical History:  Procedure Laterality Date   ABDOMINAL HYSTERECTOMY     complete   APPENDECTOMY     bladder tack     CARDIAC CATHETERIZATION  10/25/14   70% LM stensis, normal EF   CHOLECYSTECTOMY     CORONARY ARTERY BYPASS GRAFT N/A 10/29/2014   Procedure: CORONARY ARTERY BYPASS GRAFTING (CABG) TIMES TWO USING LEFT INTERNAL MAMMARY ARTERY AND RIGHT LEG GREATER SAPHENOUS VEIN HARVESTED ENDOSCOPICALLY.;  Surgeon: Heriberto London, MD;  Location: [facility] OR;  Service: Open Heart Surgery;  Laterality: N/A;   HERNIA REPAIR     INTRAOPERATIVE TRANSESOPHAGEAL ECHOCARDIOGRAM N/A 10/29/2014   Procedure: INTRAOPERATIVE TRANSESOPHAGEAL ECHOCARDIOGRAM;  Surgeon: Heriberto London, MD;  Location: [facility] OR;  Service: Open Heart Surgery;  Laterality: N/A;   INTRAOPERATIVE TRANSTHORACIC ECHOCARDIOGRAM N/A 02/10/2022   Procedure: INTRAOPERATIVE TRANSTHORACIC ECHOCARDIOGRAM;  Surgeon: Odie Benne, MD;  Location: [facility] OR;  Service: Open Heart Surgery;  Laterality: N/A;   JOINT REPLACEMENT Right 2009   hip total   LEFT HEART CATHETERIZATION WITH CORONARY ANGIOGRAM N/A 10/26/2014   Procedure: LEFT HEART CATHETERIZATION WITH  CORONARY ANGIOGRAM;  Surgeon: Millicent Ally, MD;  Location: [facility] CATH LAB;  Service: Cardiovascular;  Laterality: N/A;   RIGHT/LEFT HEART CATH AND CORONARY/GRAFT ANGIOGRAPHY N/A 12/29/2021   Procedure: RIGHT/LEFT HEART CATH AND CORONARY/GRAFT ANGIOGRAPHY;  Surgeon: Arnoldo Lapping, MD;  Location: [facility] INVASIVE CV LAB;  Service: Cardiovascular;  Laterality: N/A;   TONSILLECTOMY     TOTAL HIP ARTHROPLASTY     TOTAL HIP ARTHROPLASTY Left 05/07/2016   Procedure: TOTAL HIP ARTHROPLASTY ANTERIOR APPROACH;  Surgeon: Adonica Hoose, MD;   Location: [facility] ORS;  Service: Orthopedics;  Laterality: Left;   TRANSCATHETER AORTIC VALVE REPLACEMENT, TRANSFEMORAL N/A 02/10/2022   Procedure: TRANSCATHETER AORTIC VALVE REPLACEMENT, TRANSFEMORAL USING A MEDTRONIC EVOLUT FX TRANSCATHETER AORTIC VALVE.;  Surgeon: Odie Benne, MD;  Location: [facility] OR;  Service: Open Heart Surgery;  Laterality: N/A;    OB History   No obstetric history on file.      Home Medications    Prior to Admission medications   Medication Sig Start Date End Date Taking? Authorizing Provider  albuterol  (VENTOLIN  HFA) 108 (90 Base) MCG/ACT inhaler Inhale 2 puffs into the lungs every 4 (four) hours as needed for wheezing or shortness of breath. 02/27/23   Corbin Dess, PA-C  allopurinol  (ZYLOPRIM ) 100 MG tablet Take 100 mg by mouth daily. 09/16/17   [provider]  aspirin  EC 81 MG tablet Take 81 mg by mouth daily.    [provider]  atorvastatin  (LIPITOR ) 80 MG tablet TAKE ONE TABLET BY MOUTH ONCE DAILY AT 6 P.M. 03/27/16   Laurann Pollock, MD  cetirizine  (ZYRTEC  ALLERGY) 10 MG tablet Take 1 tablet (10 mg total) by mouth 2 (two) times daily. Patient not taking: Reported on 03/21/2024 10/18/23   Wilhemena Harbour, NP  famotidine  (PEPCID ) 20 MG tablet One daily after supper 02/29/24   Diamond Formica, MD  furosemide  (LASIX ) 20 MG tablet Take 1 tablet (20 mg total) by mouth daily. 03/27/16   Laurann Pollock, MD  glimepiride  (AMARYL ) 2 MG tablet Take 1-2 mg by mouth See admin instructions. 1 mg in the morning, 2 mg at bedtime 12/12/21   [provider]  isosorbide  mononitrate (IMDUR ) 30 MG 24 hr tablet Take 0.5 tablets (15 mg total) by mouth daily. 12/15/17   Laurann Pollock, MD  lidocaine  (XYLOCAINE ) 2 % solution Gargle and spit 5 mL every 6 hours as needed for throat pain or discomfort. Patient not taking: Reported on 03/21/2024 01/08/24   Leath-Warren, Belen Bowers, NP  losartan  (COZAAR ) 25 MG tablet Take 0.5 tablets (12.5  mg total) daily by mouth. 11/05/17   Tania Familia, NP  metoprolol  tartrate (LOPRESSOR ) 25 MG tablet Take 1 tablet (25 mg total) by mouth 2 (two) times daily. 10/25/23   Laurann Pollock, MD  Multiple Vitamin (MULTIVITAMIN WITH MINERALS) TABS tablet Take 1 tablet by mouth daily.    [provider]  Omega-3 Fatty Acids (FISH OIL) 1000 MG CAPS Take 1,000 mg by mouth daily.    [provider]  pantoprazole  (PROTONIX ) 40 MG tablet Take 1 tablet (40 mg total) by mouth daily. Take 30-60 min before first meal of the day 02/29/24   Diamond Formica, MD  potassium chloride  SA (K-DUR,KLOR-CON ) 20 MEQ tablet Take 1 tablet (20 mEq total) by mouth daily. NEED OV. 10/17/18   Tania Familia, NP    Family History Family History  Problem Relation Age of Onset   Hypertension Mother    Coronary artery  disease Mother        CABG   Hypertension Father    CAD Father        CABG   Coronary artery disease Brother        Stent    Social History Social History   Tobacco Use   Smoking status: Never   Smokeless tobacco: Never  Vaping Use   Vaping status: Never Used  Substance Use Topics   Alcohol  use: No    Alcohol /week: 0.0 standard drinks of alcohol    Drug use: No     Allergies   Iodinated contrast media   Review of Systems Review of Systems Per HPI  Physical Exam Triage Vital Signs ED Triage Vitals  Encounter Vitals Group     BP 04/13/24 0845 (!) 179/78     Systolic BP Percentile --      Diastolic BP Percentile --      Pulse Rate 04/13/24 0845 71     Resp 04/13/24 0845 16     Temp 04/13/24 0845 97.8 F (36.6 C)     Temp Source 04/13/24 0845 Oral     SpO2 04/13/24 0845 92 %     Weight --      Height --      Head Circumference --      Peak Flow --      Pain Score 04/13/24 0850 8     Pain Loc --      Pain Education --      Exclude from Growth Chart --    No data found.  Updated Vital Signs BP (!) 179/78 (BP Location: Right Arm)   Pulse 71   Temp  97.8 F (36.6 C) (Oral)   Resp 16   SpO2 92%   Visual Acuity Right Eye Distance:   Left Eye Distance:   Bilateral Distance:    Right Eye Near:   Left Eye Near:    Bilateral Near:     Physical Exam Vitals and nursing note reviewed.  Constitutional:      General: She is not in acute distress.    Appearance: Normal appearance.  HENT:     Head: Normocephalic.  Eyes:     Extraocular Movements: Extraocular movements intact.     Pupils: Pupils are equal, round, and reactive to light.  Cardiovascular:     Rate and Rhythm: Normal rate and regular rhythm.     Pulses: Normal pulses.     Heart sounds: Normal heart sounds.  Pulmonary:     Effort: Pulmonary effort is normal.     Breath sounds: Normal breath sounds.  Musculoskeletal:     Cervical back: No erythema or signs of trauma. Pain with movement and muscular tenderness present. No spinous process tenderness. Decreased range of motion.  Skin:    General: Skin is warm and dry.  Neurological:     General: No focal deficit present.     Mental Status: She is alert and oriented to person, place, and time.  Psychiatric:        Mood and Affect: Mood normal.        Behavior: Behavior normal.      UC Treatments / Results  Labs (all labs ordered are listed, but only abnormal results are displayed) Labs Reviewed - No data to display  EKG   Radiology No results found.  Procedures Procedures (including critical care time)  Medications Ordered in UC Medications - No data to display  Initial Impression / Assessment and Plan /  UC Course  I have reviewed the triage vital signs and the nursing notes.  Pertinent labs & imaging results that were available during my care of the patient were reviewed by me and considered in my medical decision making (see chart for details).  Will treat patient for neck pain with tizanidine  4 mg tablets.  Patient denies numbness, tingling, or radiation of pain at this time.  Supportive care  recommendations were provided and discussed with the patient to include gentle range of motion exercises, the use of ice or heat, and continuing over-the-counter analgesics such as Tylenol .  Patient was given strict ER follow-up precautions.  Patient was advised to follow-up with her PCP if symptoms fail to improve with this treatment.  Patient was in agreement with this plan of care and verbalized understanding.  All questions were answered.  Patient stable for discharge.  Final Clinical Impressions(s) / UC Diagnoses   Final diagnoses:  None   Discharge Instructions   None    ED Prescriptions   None    PDMP not reviewed this encounter.   Hardy Lia, NP 04/13/24 [phone]

## 2024-05-25 DIAGNOSIS — L03116 Cellulitis of left lower limb: Secondary | ICD-10-CM | POA: Diagnosis not present

## 2024-05-25 DIAGNOSIS — R030 Elevated blood-pressure reading, without diagnosis of hypertension: Secondary | ICD-10-CM | POA: Diagnosis not present

## 2024-07-25 ENCOUNTER — Ambulatory Visit
Admission: EM | Admit: 2024-07-25 | Discharge: 2024-07-25 | Disposition: A | Discharge status: Home / Self Care | Attending: Family Medicine | Admitting: Family Medicine

## 2024-07-25 DIAGNOSIS — R030 Elevated blood-pressure reading, without diagnosis of hypertension: Secondary | ICD-10-CM

## 2024-07-25 DIAGNOSIS — Z8739 Personal history of other diseases of the musculoskeletal system and connective tissue: Secondary | ICD-10-CM | POA: Diagnosis not present

## 2024-07-25 DIAGNOSIS — M25531 Pain in right wrist: Secondary | ICD-10-CM | POA: Diagnosis not present

## 2024-07-25 MED ORDER — PREDNISONE 20 MG PO TABS: 40.0000 mg | ORAL_TABLET | Freq: Every day | ORAL | 0 refills | Status: DC

## 2024-07-25 NOTE — ED Provider Notes (Signed)
 [facility]    CSN: [id number] Arrival date & time: 07/25/24  0813      History   Chief Complaint No chief complaint on file.   HPI Karina Green is a 68 y.o. female.   Patient presenting today with sudden onset right radial wrist pain that woke her up from sleep around 2 AM.  She states the area is swollen but not red, warm, and no loss of range of motion, numbness, tingling, weakness.  She did fall about a week ago but had no issues from the fall up until the pain started last night.  Do not feel it is related.  So far trying over-the-counter remedies with minimal relief.  States history of gout that feels similar.    Past Medical History:  Diagnosis Date   Arthritis    Back pain    CAD (coronary artery disease), native coronary artery-70% LM stenosis 10/25/2014   Cough last 30 yrs    nonproductive, no fevers   DM type 2 (diabetes mellitus, type 2) (HCC) 10/25/2014   GERD (gastroesophageal reflux disease)    Gout    Hyperlipidemia    Hypertension    S/P TAVR (transcatheter aortic valve replacement) 02/10/2022   s/p TAVR with 29 mm Medtronic Evolut Fx via the TF approach by Dr. Verlin & Dr. Lucas   Severe aortic stenosis     Patient Active Problem List   Diagnosis Date Noted   Psoriasis 11/30/2023   Upper airway cough syndrome 11/30/2023   DOE (dyspnea on exertion) 11/30/2023   S/P TAVR (transcatheter aortic valve replacement) 02/10/2022   Chronic diastolic heart failure (HCC) 06/20/2015   Severe aortic stenosis 06/20/2015   CKD (chronic kidney disease), stage III (HCC) 06/20/2015   CAD (coronary artery disease), native coronary artery-70% LM stenosis 10/26/2014   DM2 (diabetes mellitus, type 2) (HCC) 10/26/2014   Essential hypertension 10/24/2014   Hypercholesteremia 10/24/2014    Past Surgical History:  Procedure Laterality Date   ABDOMINAL HYSTERECTOMY     complete   APPENDECTOMY     bladder tack     CARDIAC CATHETERIZATION  10/25/14   70%  LM stensis, normal EF   CHOLECYSTECTOMY     CORONARY ARTERY BYPASS GRAFT N/A 10/29/2014   Procedure: CORONARY ARTERY BYPASS GRAFTING (CABG) TIMES TWO USING LEFT INTERNAL MAMMARY ARTERY AND RIGHT LEG GREATER SAPHENOUS VEIN HARVESTED ENDOSCOPICALLY.;  Surgeon: Maude Fleeta Ochoa, MD;  Location: [facility] OR;  Service: Open Heart Surgery;  Laterality: N/A;   HERNIA REPAIR     INTRAOPERATIVE TRANSESOPHAGEAL ECHOCARDIOGRAM N/A 10/29/2014   Procedure: INTRAOPERATIVE TRANSESOPHAGEAL ECHOCARDIOGRAM;  Surgeon: Maude Fleeta Ochoa, MD;  Location: [facility] OR;  Service: Open Heart Surgery;  Laterality: N/A;   INTRAOPERATIVE TRANSTHORACIC ECHOCARDIOGRAM N/A 02/10/2022   Procedure: INTRAOPERATIVE TRANSTHORACIC ECHOCARDIOGRAM;  Surgeon: Verlin Lonni BIRCH, MD;  Location: [facility] OR;  Service: Open Heart Surgery;  Laterality: N/A;   JOINT REPLACEMENT Right 2009   hip total   LEFT HEART CATHETERIZATION WITH CORONARY ANGIOGRAM N/A 10/26/2014   Procedure: LEFT HEART CATHETERIZATION WITH CORONARY ANGIOGRAM;  Surgeon: Debby DELENA Sor, MD;  Location: [facility] CATH LAB;  Service: Cardiovascular;  Laterality: N/A;   RIGHT/LEFT HEART CATH AND CORONARY/GRAFT ANGIOGRAPHY N/A 12/29/2021   Procedure: RIGHT/LEFT HEART CATH AND CORONARY/GRAFT ANGIOGRAPHY;  Surgeon: Wonda Sharper, MD;  Location: [facility] INVASIVE CV LAB;  Service: Cardiovascular;  Laterality: N/A;   TONSILLECTOMY     TOTAL HIP ARTHROPLASTY     TOTAL HIP ARTHROPLASTY Left 05/07/2016   Procedure: TOTAL  HIP ARTHROPLASTY ANTERIOR APPROACH;  Surgeon: Redell Shoals, MD;  Location: [facility] ORS;  Service: Orthopedics;  Laterality: Left;   TRANSCATHETER AORTIC VALVE REPLACEMENT, TRANSFEMORAL N/A 02/10/2022   Procedure: TRANSCATHETER AORTIC VALVE REPLACEMENT, TRANSFEMORAL USING A MEDTRONIC EVOLUT FX TRANSCATHETER AORTIC VALVE.;  Surgeon: Verlin Lonni BIRCH, MD;  Location: [facility] OR;  Service: Open Heart Surgery;  Laterality: N/A;    OB History   No obstetric history on file.      Home Medications     Prior to Admission medications   Medication Sig Start Date End Date Taking? Authorizing Provider  predniSONE  (DELTASONE ) 20 MG tablet Take 2 tablets (40 mg total) by mouth daily with breakfast. 07/25/24  Yes Stuart Vernell Norris, PA-C  albuterol  (VENTOLIN  HFA) 108 (90 Base) MCG/ACT inhaler Inhale 2 puffs into the lungs every 4 (four) hours as needed for wheezing or shortness of breath. 02/27/23   Stuart Vernell Norris, PA-C  allopurinol  (ZYLOPRIM ) 100 MG tablet Take 100 mg by mouth daily. 09/16/17   [provider]  aspirin  EC 81 MG tablet Take 81 mg by mouth daily.    [provider]  atorvastatin  (LIPITOR ) 80 MG tablet TAKE ONE TABLET BY MOUTH ONCE DAILY AT 6 P.M. 03/27/16   Alvan Dorn FALCON, MD  cetirizine  (ZYRTEC  ALLERGY) 10 MG tablet Take 1 tablet (10 mg total) by mouth 2 (two) times daily. Patient not taking: Reported on 03/21/2024 10/18/23   Chandra Harlene LABOR, NP  famotidine  (PEPCID ) 20 MG tablet One daily after supper 02/29/24   Darlean Ozell NOVAK, MD  furosemide  (LASIX ) 20 MG tablet Take 1 tablet (20 mg total) by mouth daily. 03/27/16   Alvan Dorn FALCON, MD  glimepiride  (AMARYL ) 2 MG tablet Take 1-2 mg by mouth See admin instructions. 1 mg in the morning, 2 mg at bedtime 12/12/21   [provider]  isosorbide  mononitrate (IMDUR ) 30 MG 24 hr tablet Take 0.5 tablets (15 mg total) by mouth daily. 12/15/17   Alvan Dorn FALCON, MD  lidocaine  (XYLOCAINE ) 2 % solution Gargle and spit 5 mL every 6 hours as needed for throat pain or discomfort. Patient not taking: Reported on 03/21/2024 01/08/24   Leath-Warren, Etta PARAS, NP  losartan  (COZAAR ) 25 MG tablet Take 0.5 tablets (12.5 mg total) daily by mouth. 11/05/17   Jerilynn Lamarr HERO, NP  metoprolol  tartrate (LOPRESSOR ) 25 MG tablet Take 1 tablet (25 mg total) by mouth 2 (two) times daily. 10/25/23   Alvan Dorn FALCON, MD  Multiple Vitamin (MULTIVITAMIN WITH MINERALS) TABS tablet Take 1 tablet by mouth daily.    [provider]  Omega-3 Fatty Acids (FISH OIL) 1000 MG CAPS Take 1,000 mg by mouth daily.    [provider]  pantoprazole  (PROTONIX ) 40 MG tablet Take 1 tablet (40 mg total) by mouth daily. Take 30-60 min before first meal of the day 02/29/24   Darlean Ozell NOVAK, MD  potassium chloride  SA (K-DUR,KLOR-CON ) 20 MEQ tablet Take 1 tablet (20 mEq total) by mouth daily. NEED OV. 10/17/18   Jerilynn Lamarr HERO, NP  tiZANidine  (ZANAFLEX ) 4 MG tablet Take 1 tablet (4 mg total) by mouth every 8 (eight) hours as needed for muscle spasms. 04/13/24   Leath-Warren, Etta PARAS, NP    Family History Family History  Problem Relation Age of Onset   Hypertension Mother    Coronary artery disease Mother        CABG   Hypertension Father    CAD Father  CABG   Coronary artery disease Brother        Stent    Social History Social History   Tobacco Use   Smoking status: Never   Smokeless tobacco: Never  Vaping Use   Vaping status: Never Used  Substance Use Topics   Alcohol  use: No    Alcohol /week: 0.0 standard drinks of alcohol    Drug use: No     Allergies   Iodinated contrast media   Review of Systems Review of Systems Per HPI  Physical Exam Triage Vital Signs ED Triage Vitals  Encounter Vitals Group     BP 07/25/24 0843 (!) 175/80     Girls Systolic BP Percentile --      Girls Diastolic BP Percentile --      Boys Systolic BP Percentile --      Boys Diastolic BP Percentile --      Pulse Rate 07/25/24 0843 61     Resp 07/25/24 0843 20     Temp 07/25/24 0843 98.1 F (36.7 C)     Temp Source 07/25/24 0843 Oral     SpO2 07/25/24 0843 93 %     Weight --      Height --      Head Circumference --      Peak Flow --      Pain Score 07/25/24 0844 8     Pain Loc --      Pain Education --      Exclude from Growth Chart --    No data found.  Updated Vital Signs BP (!) 175/80 (BP Location: Right Arm)   Pulse 61   Temp 98.1 F (36.7 C) (Oral)   Resp 20   SpO2 93%    Visual Acuity Right Eye Distance:   Left Eye Distance:   Bilateral Distance:    Right Eye Near:   Left Eye Near:    Bilateral Near:     Physical Exam Vitals and nursing note reviewed.  Constitutional:      Appearance: Normal appearance. She is not ill-appearing.  HENT:     Head: Atraumatic.  Eyes:     Extraocular Movements: Extraocular movements intact.     Conjunctiva/sclera: Conjunctivae normal.  Cardiovascular:     Rate and Rhythm: Normal rate.  Pulmonary:     Effort: Pulmonary effort is normal.  Musculoskeletal:        General: Swelling and tenderness present. No signs of injury. Normal range of motion.     Cervical back: Normal range of motion and neck supple.     Comments: Mild tenderness to palpation to the right radial wrist, slightly edematous but no erythema, bruising, bony deformity palpable.  Range of motion intact but painful for patient.  Skin:    General: Skin is warm and dry.     Findings: No erythema.  Neurological:     Mental Status: She is alert and oriented to person, place, and time.     Comments: Right upper extremity neurovascularly intact  Psychiatric:        Mood and Affect: Mood normal.        Thought Content: Thought content normal.        Judgment: Judgment normal.      UC Treatments / Results  Labs (all labs ordered are listed, but only abnormal results are displayed) Labs Reviewed - No data to display  EKG   Radiology No results found.  Procedures Procedures (including critical care time)  Medications Ordered in  UC Medications - No data to display  Initial Impression / Assessment and Plan / UC Course  I have reviewed the triage vital signs and the nursing notes.  Pertinent labs & imaging results that were available during my care of the patient were reviewed by me and considered in my medical decision making (see chart for details).     Patient declines x-ray today, will treat for possible gout with course of  prednisone , tart cherry, supportive over-the-counter medications and home care as she states this feels very similar to prior gout flares.  Return for worsening or unresolving symptoms for further evaluation.  Blood pressure elevated today, continue chronic medications, home monitoring and follow-up with PCP if not resolving. Final Clinical Impressions(s) / UC Diagnoses   Final diagnoses:  Right wrist pain  History of gout  Elevated blood pressure reading   Discharge Instructions   None    ED Prescriptions     Medication Sig Dispense Auth. Provider   predniSONE  (DELTASONE ) 20 MG tablet Take 2 tablets (40 mg total) by mouth daily with breakfast. 10 tablet Stuart Vernell Norris, [place]      PDMP not reviewed this encounter.   Stuart Vernell Norris, [place] 07/25/24 1025

## 2024-07-25 NOTE — ED Triage Notes (Signed)
 Pt reports right hand pain and swelling at the wrist, started at 2 am this morning waking patient. Pt recalls a fall x 1 week but pain didn't start until this morning.

## 2024-08-09 ENCOUNTER — Telehealth: Payer: Self-pay | Admitting: Cardiology

## 2024-08-09 MED ORDER — METOPROLOL TARTRATE 25 MG PO TABS: 25.0000 mg | ORAL_TABLET | Freq: Two times a day (BID) | ORAL | 1 refills | Status: AC

## 2024-08-09 MED ORDER — LOSARTAN POTASSIUM 25 MG PO TABS: 12.5000 mg | ORAL_TABLET | Freq: Every day | ORAL | 1 refills | Status: DC

## 2024-08-09 MED ORDER — ISOSORBIDE MONONITRATE ER 30 MG PO TB24: 15.0000 mg | ORAL_TABLET | Freq: Every day | ORAL | 1 refills | Status: AC

## 2024-08-09 MED ORDER — ATORVASTATIN CALCIUM 80 MG PO TABS: ORAL_TABLET | ORAL | Status: AC

## 2024-08-09 NOTE — Telephone Encounter (Signed)
 Refilled per request.

## 2024-08-09 NOTE — Telephone Encounter (Signed)
 1. Which medications need to be refilled? (please list name of each medication and dose if known)   Atorvastatin  80mg  Losartin 25mg  Metoprolol  25mg   Isosobride 30mg    2. Which pharmacy/location (including street and city if local pharmacy) is medication to be sent to?  [facility] [place]   3. Do they need a 30 day or 90 day supply? 40  PT's daughter said primary may prescribe these but they are closing and she can't get in with a new primary until November

## 2024-09-26 NOTE — Patient Instructions (Incomplete)

## 2024-10-02 ENCOUNTER — Ambulatory Visit: Payer: Self-pay | Admitting: Nurse Practitioner

## 2024-10-02 DIAGNOSIS — E1165 Type 2 diabetes mellitus with hyperglycemia: Secondary | ICD-10-CM

## 2024-10-02 DIAGNOSIS — Z7984 Long term (current) use of oral hypoglycemic drugs: Secondary | ICD-10-CM

## 2024-10-02 DIAGNOSIS — E782 Mixed hyperlipidemia: Secondary | ICD-10-CM

## 2024-10-02 DIAGNOSIS — I10 Essential (primary) hypertension: Secondary | ICD-10-CM

## 2024-10-25 ENCOUNTER — Encounter: Payer: Self-pay | Admitting: Emergency Medicine

## 2024-10-25 ENCOUNTER — Ambulatory Visit
Admission: EM | Admit: 2024-10-25 | Discharge: 2024-10-25 | Disposition: A | Discharge status: Home / Self Care | Attending: Family Medicine | Admitting: Family Medicine

## 2024-10-25 DIAGNOSIS — E119 Type 2 diabetes mellitus without complications: Secondary | ICD-10-CM

## 2024-10-25 DIAGNOSIS — M109 Gout, unspecified: Secondary | ICD-10-CM | POA: Diagnosis not present

## 2024-10-25 DIAGNOSIS — Z7984 Long term (current) use of oral hypoglycemic drugs: Secondary | ICD-10-CM

## 2024-10-25 MED ORDER — DEXAMETHASONE SOD PHOSPHATE PF 10 MG/ML IJ SOLN
10.0000 mg | Freq: Once | INTRAMUSCULAR | Status: AC
  Administered 2024-10-25: 10 mg via INTRAMUSCULAR

## 2024-10-25 MED ORDER — GLIMEPIRIDE 2 MG PO TABS: 2.0000 mg | ORAL_TABLET | ORAL | 1 refills | Status: DC

## 2024-10-25 NOTE — ED Triage Notes (Signed)
 Pain in right wrist x 3 days.  Hx of gout.

## 2024-10-25 NOTE — ED Provider Notes (Signed)
 [facility]    CSN: [id number] Arrival date & time: 10/25/24  1529      History   Chief Complaint No chief complaint on file.   HPI Karina Green is a 68 y.o. female.   Patient presenting today with 3-day history of right wrist pain, swelling, redness.  Denies injury to the area, numbness, tingling, weakness, loss of range of motion.  History of gout and states this feels exactly like her typical gout flares.  Has tried some over-the-counter pain relievers and has taken her allopurinol  daily with minimal relief.  She is also requesting a refill on her glipizide as she does not establish care with a new primary care until the end of the month and is out of her diabetes medication.  Does not check her home blood sugars at home.  Denies polyphasia, polydipsia, polyuria.    Past Medical History:  Diagnosis Date   Arthritis    Back pain    CAD (coronary artery disease), native coronary artery-70% LM stenosis 10/25/2014   Cough last 30 yrs    nonproductive, no fevers   DM type 2 (diabetes mellitus, type 2) (HCC) 10/25/2014   GERD (gastroesophageal reflux disease)    Gout    Hyperlipidemia    Hypertension    S/P TAVR (transcatheter aortic valve replacement) 02/10/2022   s/p TAVR with 29 mm Medtronic Evolut Fx via the TF approach by Dr. Verlin & Dr. Lucas   Severe aortic stenosis     Patient Active Problem List   Diagnosis Date Noted   Psoriasis 11/30/2023   Upper airway cough syndrome 11/30/2023   DOE (dyspnea on exertion) 11/30/2023   S/P TAVR (transcatheter aortic valve replacement) 02/10/2022   Chronic diastolic heart failure (HCC) 06/20/2015   Severe aortic stenosis 06/20/2015   CKD (chronic kidney disease), stage III (HCC) 06/20/2015   CAD (coronary artery disease), native coronary artery-70% LM stenosis 10/26/2014   DM2 (diabetes mellitus, type 2) (HCC) 10/26/2014   Essential hypertension 10/24/2014   Hypercholesteremia 10/24/2014    Past Surgical  History:  Procedure Laterality Date   ABDOMINAL HYSTERECTOMY     complete   APPENDECTOMY     bladder tack     CARDIAC CATHETERIZATION  10/25/14   70% LM stensis, normal EF   CHOLECYSTECTOMY     CORONARY ARTERY BYPASS GRAFT N/A 10/29/2014   Procedure: CORONARY ARTERY BYPASS GRAFTING (CABG) TIMES TWO USING LEFT INTERNAL MAMMARY ARTERY AND RIGHT LEG GREATER SAPHENOUS VEIN HARVESTED ENDOSCOPICALLY.;  Surgeon: Maude Fleeta Ochoa, MD;  Location: [facility] OR;  Service: Open Heart Surgery;  Laterality: N/A;   HERNIA REPAIR     INTRAOPERATIVE TRANSESOPHAGEAL ECHOCARDIOGRAM N/A 10/29/2014   Procedure: INTRAOPERATIVE TRANSESOPHAGEAL ECHOCARDIOGRAM;  Surgeon: Maude Fleeta Ochoa, MD;  Location: [facility] OR;  Service: Open Heart Surgery;  Laterality: N/A;   INTRAOPERATIVE TRANSTHORACIC ECHOCARDIOGRAM N/A 02/10/2022   Procedure: INTRAOPERATIVE TRANSTHORACIC ECHOCARDIOGRAM;  Surgeon: Verlin Lonni BIRCH, MD;  Location: [facility] OR;  Service: Open Heart Surgery;  Laterality: N/A;   JOINT REPLACEMENT Right 2009   hip total   LEFT HEART CATHETERIZATION WITH CORONARY ANGIOGRAM N/A 10/26/2014   Procedure: LEFT HEART CATHETERIZATION WITH CORONARY ANGIOGRAM;  Surgeon: Debby DELENA Sor, MD;  Location: [facility] CATH LAB;  Service: Cardiovascular;  Laterality: N/A;   RIGHT/LEFT HEART CATH AND CORONARY/GRAFT ANGIOGRAPHY N/A 12/29/2021   Procedure: RIGHT/LEFT HEART CATH AND CORONARY/GRAFT ANGIOGRAPHY;  Surgeon: Wonda Sharper, MD;  Location: [facility] INVASIVE CV LAB;  Service: Cardiovascular;  Laterality: N/A;   TONSILLECTOMY  TOTAL HIP ARTHROPLASTY     TOTAL HIP ARTHROPLASTY Left 05/07/2016   Procedure: TOTAL HIP ARTHROPLASTY ANTERIOR APPROACH;  Surgeon: Redell Shoals, MD;  Location: [facility] ORS;  Service: Orthopedics;  Laterality: Left;   TRANSCATHETER AORTIC VALVE REPLACEMENT, TRANSFEMORAL N/A 02/10/2022   Procedure: TRANSCATHETER AORTIC VALVE REPLACEMENT, TRANSFEMORAL USING A MEDTRONIC EVOLUT FX TRANSCATHETER AORTIC VALVE.;  Surgeon: Verlin Lonni BIRCH, MD;  Location: [facility] OR;  Service: Open Heart Surgery;  Laterality: N/A;    OB History   No obstetric history on file.      Home Medications    Prior to Admission medications   Medication Sig Start Date End Date Taking? Authorizing Provider  albuterol  (VENTOLIN  HFA) 108 (90 Base) MCG/ACT inhaler Inhale 2 puffs into the lungs every 4 (four) hours as needed for wheezing or shortness of breath. 02/27/23   Stuart Vernell Norris, PA-C  allopurinol  (ZYLOPRIM ) 100 MG tablet Take 100 mg by mouth daily. 09/16/17   [provider]  aspirin  EC 81 MG tablet Take 81 mg by mouth daily.    [provider]  atorvastatin  (LIPITOR ) 80 MG tablet TAKE ONE TABLET BY MOUTH ONCE DAILY AT 6 P.M. 08/09/24   Alvan Dorn FALCON, MD  cetirizine  (ZYRTEC  ALLERGY) 10 MG tablet Take 1 tablet (10 mg total) by mouth 2 (two) times daily. Patient not taking: Reported on 03/21/2024 10/18/23   Chandra Harlene LABOR, NP  famotidine  (PEPCID ) 20 MG tablet One daily after supper 02/29/24   Darlean Ozell NOVAK, MD  furosemide  (LASIX ) 20 MG tablet Take 1 tablet (20 mg total) by mouth daily. 03/27/16   Alvan Dorn FALCON, MD  glimepiride  (AMARYL ) 2 MG tablet Take 1 tablet (2 mg total) by mouth See admin instructions. 1 mg in the morning, 2 mg at bedtime 10/25/24   Stuart Vernell Norris, PA-C  isosorbide  mononitrate (IMDUR ) 30 MG 24 hr tablet Take 0.5 tablets (15 mg total) by mouth daily. 08/09/24   Alvan Dorn FALCON, MD  lidocaine  (XYLOCAINE ) 2 % solution Gargle and spit 5 mL every 6 hours as needed for throat pain or discomfort. Patient not taking: Reported on 03/21/2024 01/08/24   Leath-Warren, Etta PARAS, NP  losartan  (COZAAR ) 25 MG tablet Take 0.5 tablets (12.5 mg total) by mouth daily. 08/09/24   Alvan Dorn FALCON, MD  metoprolol  tartrate (LOPRESSOR ) 25 MG tablet Take 1 tablet (25 mg total) by mouth 2 (two) times daily. 08/09/24   Alvan Dorn FALCON, MD  Multiple Vitamin (MULTIVITAMIN WITH MINERALS) TABS tablet Take  1 tablet by mouth daily.    [provider]  Omega-3 Fatty Acids (FISH OIL) 1000 MG CAPS Take 1,000 mg by mouth daily.    [provider]  pantoprazole  (PROTONIX ) 40 MG tablet Take 1 tablet (40 mg total) by mouth daily. Take 30-60 min before first meal of the day 02/29/24   Darlean Ozell NOVAK, MD  potassium chloride  SA (K-DUR,KLOR-CON ) 20 MEQ tablet Take 1 tablet (20 mEq total) by mouth daily. NEED OV. 10/17/18   Jerilynn Lamarr HERO, NP  tiZANidine  (ZANAFLEX ) 4 MG tablet Take 1 tablet (4 mg total) by mouth every 8 (eight) hours as needed for muscle spasms. 04/13/24   Leath-Warren, Etta PARAS, NP    Family History Family History  Problem Relation Age of Onset   Hypertension Mother    Coronary artery disease Mother        CABG   Hypertension Father    CAD Father        CABG  Coronary artery disease Brother        Stent    Social History Social History   Tobacco Use   Smoking status: Never   Smokeless tobacco: Never  Vaping Use   Vaping status: Never Used  Substance Use Topics   Alcohol  use: No    Alcohol /week: 0.0 standard drinks of alcohol    Drug use: No     Allergies   Iodinated contrast media   Review of Systems Review of Systems Per HPI  Physical Exam Triage Vital Signs ED Triage Vitals  Encounter Vitals Group     BP 10/25/24 1601 (!) 157/72     Girls Systolic BP Percentile --      Girls Diastolic BP Percentile --      Boys Systolic BP Percentile --      Boys Diastolic BP Percentile --      Pulse Rate 10/25/24 1601 65     Resp 10/25/24 1601 20     Temp 10/25/24 1601 98.5 F (36.9 C)     Temp Source 10/25/24 1601 Oral     SpO2 10/25/24 1601 95 %     Weight --      Height --      Head Circumference --      Peak Flow --      Pain Score 10/25/24 1602 8     Pain Loc --      Pain Education --      Exclude from Growth Chart --    No data found.  Updated Vital Signs BP (!) 157/72 (BP Location: Right Arm)   Pulse 65   Temp 98.5 F (36.9  C) (Oral)   Resp 20   SpO2 95%   Visual Acuity Right Eye Distance:   Left Eye Distance:   Bilateral Distance:    Right Eye Near:   Left Eye Near:    Bilateral Near:     Physical Exam Vitals and nursing note reviewed.  Constitutional:      Appearance: Normal appearance. She is not ill-appearing.  HENT:     Head: Atraumatic.  Eyes:     Extraocular Movements: Extraocular movements intact.     Conjunctiva/sclera: Conjunctivae normal.  Cardiovascular:     Rate and Rhythm: Normal rate.  Pulmonary:     Effort: Pulmonary effort is normal.  Musculoskeletal:        General: Swelling and tenderness present. Normal range of motion.     Cervical back: Normal range of motion and neck supple.     Comments: Localized area of edema, erythema, significant tenderness to palpation to the right wrist dorsally.  Skin:    General: Skin is warm and dry.     Findings: Erythema present.  Neurological:     Mental Status: She is alert and oriented to person, place, and time.     Comments: Right upper extremity neurovascularly intact  Psychiatric:        Mood and Affect: Mood normal.        Thought Content: Thought content normal.        Judgment: Judgment normal.      UC Treatments / Results  Labs (all labs ordered are listed, but only abnormal results are displayed) Labs Reviewed - No data to display  EKG   Radiology No results found.  Procedures Procedures (including critical care time)  Medications Ordered in UC Medications  dexamethasone  (DECADRON ) injection 10 mg (10 mg Intramuscular Given 10/25/24 1712)    Initial Impression /  Assessment and Plan / UC Course  I have reviewed the triage vital signs and the nursing notes.  Pertinent labs & imaging results that were available during my care of the patient were reviewed by me and considered in my medical decision making (see chart for details).     Treat gout flare with IM Decadron , continue allopurinol , dietary  modifications, tart cherry supplement.  Will refill glipizide, and follow-up as scheduled with new primary care at the end of the month for routine labs and further management.  Final Clinical Impressions(s) / UC Diagnoses   Final diagnoses:  Acute gout of right wrist, unspecified cause  Type 2 diabetes mellitus without complication, without long-term current use of insulin  [facility])   Discharge Instructions   None    ED Prescriptions     Medication Sig Dispense Auth. Provider   glimepiride  (AMARYL ) 2 MG tablet Take 1 tablet (2 mg total) by mouth See admin instructions. 1 mg in the morning, 2 mg at bedtime 30 tablet Stuart Vernell Norris, [place]      PDMP not reviewed this encounter.   Stuart Vernell Norris, [place] 10/25/24 1719

## 2024-11-07 ENCOUNTER — Ambulatory Visit

## 2024-12-11 ENCOUNTER — Ambulatory Visit
Admission: EM | Admit: 2024-12-11 | Discharge: 2024-12-11 | Disposition: A | Discharge status: Home / Self Care | Attending: Family Medicine | Admitting: Family Medicine

## 2024-12-11 DIAGNOSIS — R600 Localized edema: Secondary | ICD-10-CM | POA: Insufficient documentation

## 2024-12-11 DIAGNOSIS — N390 Urinary tract infection, site not specified: Secondary | ICD-10-CM | POA: Insufficient documentation

## 2024-12-11 DIAGNOSIS — M79642 Pain in left hand: Secondary | ICD-10-CM | POA: Insufficient documentation

## 2024-12-11 LAB — POCT URINE DIPSTICK
Bilirubin, UA: NEGATIVE
Glucose, UA: 500 mg/dL — AB
Ketones, POC UA: NEGATIVE mg/dL
Nitrite, UA: NEGATIVE
POC PROTEIN,UA: NEGATIVE
Spec Grav, UA: 1.015
Urobilinogen, UA: 0.2 U/dL
pH, UA: 5.5

## 2024-12-11 MED ORDER — PREDNISONE 20 MG PO TABS: 40.0000 mg | ORAL_TABLET | Freq: Every day | ORAL | 0 refills | Status: DC

## 2024-12-11 MED ORDER — CEPHALEXIN 500 MG PO CAPS: 500.0000 mg | ORAL_CAPSULE | Freq: Two times a day (BID) | ORAL | 0 refills | Status: DC

## 2024-12-11 NOTE — ED Triage Notes (Signed)
 Pt reports burning with urination, lower abdominal pressure, urinary discomfort, urinary urgency making small amounts of urine.

## 2024-12-11 NOTE — Discharge Instructions (Addendum)
 Ice, elevated, tylenol  as needed and try the prednisone  - see if this resolves your hand pain

## 2024-12-11 NOTE — ED Provider Notes (Signed)
 " [facility]    CSN: [id number] Arrival date & time: 12/11/24  1541      History   Chief Complaint No chief complaint on file.   HPI Karina Green is a 68 y.o. female.   Patient presenting today with multiple concerns.  She states she is having 1 day history of dysuria, lower abdominal pressure, urinary urgency and hesitancy.  Denies fever, chills, flank pain, nausea, vomiting.  So far not trying anything over-the-counter for symptoms.  Also has several days of left hand pain, swelling.  No known injury, denies loss of range of motion, numbness, tingling, weakness.  So far not trying anything over-the-counter for this.    Past Medical History:  Diagnosis Date   Arthritis    Back pain    CAD (coronary artery disease), native coronary artery-70% LM stenosis 10/25/2014   Cough last 30 yrs    nonproductive, no fevers   DM type 2 (diabetes mellitus, type 2) (HCC) 10/25/2014   GERD (gastroesophageal reflux disease)    Gout    Hyperlipidemia    Hypertension    S/P TAVR (transcatheter aortic valve replacement) 02/10/2022   s/p TAVR with 29 mm Medtronic Evolut Fx via the TF approach by Dr. Verlin & Dr. Lucas   Severe aortic stenosis     Patient Active Problem List   Diagnosis Date Noted   Psoriasis 11/30/2023   Upper airway cough syndrome 11/30/2023   DOE (dyspnea on exertion) 11/30/2023   S/P TAVR (transcatheter aortic valve replacement) 02/10/2022   Chronic diastolic heart failure (HCC) 06/20/2015   Severe aortic stenosis 06/20/2015   CKD (chronic kidney disease), stage III (HCC) 06/20/2015   CAD (coronary artery disease), native coronary artery-70% LM stenosis 10/26/2014   DM2 (diabetes mellitus, type 2) (HCC) 10/26/2014   Essential hypertension 10/24/2014   Hypercholesteremia 10/24/2014    Past Surgical History:  Procedure Laterality Date   ABDOMINAL HYSTERECTOMY     complete   APPENDECTOMY     bladder tack     CARDIAC CATHETERIZATION  10/25/14    70% LM stensis, normal EF   CHOLECYSTECTOMY     CORONARY ARTERY BYPASS GRAFT N/A 10/29/2014   Procedure: CORONARY ARTERY BYPASS GRAFTING (CABG) TIMES TWO USING LEFT INTERNAL MAMMARY ARTERY AND RIGHT LEG GREATER SAPHENOUS VEIN HARVESTED ENDOSCOPICALLY.;  Surgeon: Maude Fleeta Ochoa, MD;  Location: [facility] OR;  Service: Open Heart Surgery;  Laterality: N/A;   HERNIA REPAIR     INTRAOPERATIVE TRANSESOPHAGEAL ECHOCARDIOGRAM N/A 10/29/2014   Procedure: INTRAOPERATIVE TRANSESOPHAGEAL ECHOCARDIOGRAM;  Surgeon: Maude Fleeta Ochoa, MD;  Location: [facility] OR;  Service: Open Heart Surgery;  Laterality: N/A;   INTRAOPERATIVE TRANSTHORACIC ECHOCARDIOGRAM N/A 02/10/2022   Procedure: INTRAOPERATIVE TRANSTHORACIC ECHOCARDIOGRAM;  Surgeon: Verlin Lonni BIRCH, MD;  Location: [facility] OR;  Service: Open Heart Surgery;  Laterality: N/A;   JOINT REPLACEMENT Right 2009   hip total   LEFT HEART CATHETERIZATION WITH CORONARY ANGIOGRAM N/A 10/26/2014   Procedure: LEFT HEART CATHETERIZATION WITH CORONARY ANGIOGRAM;  Surgeon: Debby DELENA Sor, MD;  Location: [facility] CATH LAB;  Service: Cardiovascular;  Laterality: N/A;   RIGHT/LEFT HEART CATH AND CORONARY/GRAFT ANGIOGRAPHY N/A 12/29/2021   Procedure: RIGHT/LEFT HEART CATH AND CORONARY/GRAFT ANGIOGRAPHY;  Surgeon: Wonda Sharper, MD;  Location: [facility] INVASIVE CV LAB;  Service: Cardiovascular;  Laterality: N/A;   TONSILLECTOMY     TOTAL HIP ARTHROPLASTY     TOTAL HIP ARTHROPLASTY Left 05/07/2016   Procedure: TOTAL HIP ARTHROPLASTY ANTERIOR APPROACH;  Surgeon: Redell Shoals, MD;  Location: [facility];  Service: Orthopedics;  Laterality: Left;   TRANSCATHETER AORTIC VALVE REPLACEMENT, TRANSFEMORAL N/A 02/10/2022   Procedure: TRANSCATHETER AORTIC VALVE REPLACEMENT, TRANSFEMORAL USING A MEDTRONIC EVOLUT FX TRANSCATHETER AORTIC VALVE.;  Surgeon: Verlin Lonni BIRCH, MD;  Location: [facility] OR;  Service: Open Heart Surgery;  Laterality: N/A;    OB History   No obstetric history on file.      Home  Medications    Prior to Admission medications  Medication Sig Start Date End Date Taking? Authorizing Provider  cephALEXin  (KEFLEX ) 500 MG capsule Take 1 capsule (500 mg total) by mouth 2 (two) times daily. 12/11/24  Yes Stuart Vernell Norris, PA-C  predniSONE  (DELTASONE ) 20 MG tablet Take 2 tablets (40 mg total) by mouth daily with breakfast. 12/11/24  Yes Stuart Vernell Norris, PA-C  albuterol  (VENTOLIN  HFA) 108 (90 Base) MCG/ACT inhaler Inhale 2 puffs into the lungs every 4 (four) hours as needed for wheezing or shortness of breath. 02/27/23   Stuart Vernell Norris, PA-C  allopurinol  (ZYLOPRIM ) 100 MG tablet Take 100 mg by mouth daily. 09/16/17   [provider]  aspirin  EC 81 MG tablet Take 81 mg by mouth daily.    [provider]  atorvastatin  (LIPITOR ) 80 MG tablet TAKE ONE TABLET BY MOUTH ONCE DAILY AT 6 P.M. 08/09/24   Alvan Dorn FALCON, MD  cetirizine  (ZYRTEC  ALLERGY) 10 MG tablet Take 1 tablet (10 mg total) by mouth 2 (two) times daily. Patient not taking: Reported on 03/21/2024 10/18/23   Chandra Harlene LABOR, NP  famotidine  (PEPCID ) 20 MG tablet One daily after supper 02/29/24   Darlean Ozell NOVAK, MD  furosemide  (LASIX ) 20 MG tablet Take 1 tablet (20 mg total) by mouth daily. 03/27/16   Alvan Dorn FALCON, MD  glimepiride  (AMARYL ) 2 MG tablet Take 1 tablet (2 mg total) by mouth See admin instructions. 1 mg in the morning, 2 mg at bedtime 10/25/24   Stuart Vernell Norris, PA-C  isosorbide  mononitrate (IMDUR ) 30 MG 24 hr tablet Take 0.5 tablets (15 mg total) by mouth daily. 08/09/24   Alvan Dorn FALCON, MD  lidocaine  (XYLOCAINE ) 2 % solution Gargle and spit 5 mL every 6 hours as needed for throat pain or discomfort. Patient not taking: Reported on 03/21/2024 01/08/24   Leath-Warren, Etta PARAS, NP  losartan  (COZAAR ) 25 MG tablet Take 0.5 tablets (12.5 mg total) by mouth daily. 08/09/24   Alvan Dorn FALCON, MD  metoprolol  tartrate (LOPRESSOR ) 25 MG tablet Take 1 tablet (25 mg  total) by mouth 2 (two) times daily. 08/09/24   Alvan Dorn FALCON, MD  Multiple Vitamin (MULTIVITAMIN WITH MINERALS) TABS tablet Take 1 tablet by mouth daily.    [provider]  Omega-3 Fatty Acids (FISH OIL) 1000 MG CAPS Take 1,000 mg by mouth daily.    [provider]  pantoprazole  (PROTONIX ) 40 MG tablet Take 1 tablet (40 mg total) by mouth daily. Take 30-60 min before first meal of the day 02/29/24   Darlean Ozell NOVAK, MD  potassium chloride  SA (K-DUR,KLOR-CON ) 20 MEQ tablet Take 1 tablet (20 mEq total) by mouth daily. NEED OV. 10/17/18   Jerilynn Lamarr HERO, NP  tiZANidine  (ZANAFLEX ) 4 MG tablet Take 1 tablet (4 mg total) by mouth every 8 (eight) hours as needed for muscle spasms. 04/13/24   Leath-Warren, Etta PARAS, NP    Family History Family History  Problem Relation Age of Onset   Hypertension Mother    Coronary artery disease Mother  CABG   Hypertension Father    CAD Father        CABG   Coronary artery disease Brother        Stent    Social History Social History[1]   Allergies   Iodinated contrast media   Review of Systems Review of Systems PER HPI  Physical Exam Triage Vital Signs ED Triage Vitals  Encounter Vitals Group     BP 12/11/24 1653 138/69     Girls Systolic BP Percentile --      Girls Diastolic BP Percentile --      Boys Systolic BP Percentile --      Boys Diastolic BP Percentile --      Pulse Rate 12/11/24 1653 (!) 58     Resp 12/11/24 1653 20     Temp 12/11/24 1653 98.3 F (36.8 C)     Temp Source 12/11/24 1653 Oral     SpO2 12/11/24 1653 92 %     Weight --      Height --      Head Circumference --      Peak Flow --      Pain Score 12/11/24 1654 0     Pain Loc --      Pain Education --      Exclude from Growth Chart --    No data found.  Updated Vital Signs BP 138/69 (BP Location: Right Arm)   Pulse (!) 58   Temp 98.3 F (36.8 C) (Oral)   Resp 20   SpO2 92%   Visual Acuity Right Eye Distance:   Left Eye  Distance:   Bilateral Distance:    Right Eye Near:   Left Eye Near:    Bilateral Near:     Physical Exam Vitals and nursing note reviewed.  Constitutional:      Appearance: Normal appearance. She is not ill-appearing.  HENT:     Head: Atraumatic.     Mouth/Throat:     Mouth: Mucous membranes are moist.     Pharynx: Oropharynx is clear.  Eyes:     Extraocular Movements: Extraocular movements intact.     Conjunctiva/sclera: Conjunctivae normal.  Cardiovascular:     Rate and Rhythm: Normal rate.  Pulmonary:     Effort: Pulmonary effort is normal.  Abdominal:     General: Bowel sounds are normal. There is no distension.     Palpations: Abdomen is soft.     Tenderness: There is no abdominal tenderness. There is no right CVA tenderness, left CVA tenderness or guarding.  Musculoskeletal:        General: Swelling, tenderness and signs of injury present. Normal range of motion.     Cervical back: Normal range of motion and neck supple.  Skin:    General: Skin is warm and dry.     Findings: Erythema present.     Comments: Localized area of tenderness to palpation, erythema, edema over the left third MCP.  Range of motion intact  Neurological:     Mental Status: She is alert and oriented to person, place, and time.     Comments: Left hand neurovascularly intact  Psychiatric:        Mood and Affect: Mood normal.        Thought Content: Thought content normal.        Judgment: Judgment normal.      UC Treatments / Results  Labs (all labs ordered are listed, but only abnormal results are displayed) Labs Reviewed  POCT URINE DIPSTICK - Abnormal; Notable for the following components:      Result Value   Glucose, UA =500 (*)    Blood, UA trace-intact (*)    Leukocytes, UA Small (1+) (*)    All other components within normal limits  URINE CULTURE    EKG   Radiology No results found.  Procedures Procedures (including critical care time)  Medications Ordered in  UC Medications - No data to display  Initial Impression / Assessment and Plan / UC Course  I have reviewed the triage vital signs and the nursing notes.  Pertinent labs & imaging results that were available during my care of the patient were reviewed by me and considered in my medical decision making (see chart for details).     Vitals and exam reassuring today, urinalysis today with evidence of a urinary tract infection.  Treat with Keflex  and await urine culture.  Push fluids, supportive over-the-counter medications reviewed.  X-ray imaging deferred per shared decision making regarding left hand pain given no injury and range of motion intact.  She does have a history of gout, will treat with prednisone  for suspected inflammatory cause, discussed RICE protocol and over-the-counter pain relievers.  Return for worsening or unresolving symptoms.  Final Clinical Impressions(s) / UC Diagnoses   Final diagnoses:  Acute lower UTI  Hand edema  Left hand pain     Discharge Instructions      Ice, elevated, tylenol  as needed and try the prednisone  - see if this resolves your hand pain    ED Prescriptions     Medication Sig Dispense Auth. Provider   predniSONE  (DELTASONE ) 20 MG tablet Take 2 tablets (40 mg total) by mouth daily with breakfast. 10 tablet Stuart Vernell Norris, PA-C   cephALEXin  (KEFLEX ) 500 MG capsule Take 1 capsule (500 mg total) by mouth 2 (two) times daily. 10 capsule Stuart Vernell Norris, [place]      PDMP not reviewed this encounter.    [1]  Social History Tobacco Use   Smoking status: Never   Smokeless tobacco: Never  Vaping Use   Vaping status: Never Used  Substance Use Topics   Alcohol  use: No    Alcohol /week: 0.0 standard drinks of alcohol    Drug use: No     Stuart Vernell Norris, PA-C 12/11/24 1735  "

## 2024-12-13 ENCOUNTER — Emergency Department (HOSPITAL_COMMUNITY)

## 2024-12-13 ENCOUNTER — Inpatient Hospital Stay (HOSPITAL_COMMUNITY)
Admission: EM | Admit: 2024-12-13 | Discharge: 2024-12-16 | Disposition: A | Discharge status: Home / Self Care | Attending: Internal Medicine | Admitting: Internal Medicine

## 2024-12-13 ENCOUNTER — Encounter (HOSPITAL_COMMUNITY): Payer: Self-pay

## 2024-12-13 ENCOUNTER — Other Ambulatory Visit: Payer: Self-pay

## 2024-12-13 DIAGNOSIS — Z7982 Long term (current) use of aspirin: Secondary | ICD-10-CM

## 2024-12-13 DIAGNOSIS — I5032 Chronic diastolic (congestive) heart failure: Secondary | ICD-10-CM | POA: Diagnosis present

## 2024-12-13 DIAGNOSIS — E876 Hypokalemia: Secondary | ICD-10-CM | POA: Diagnosis not present

## 2024-12-13 DIAGNOSIS — E1165 Type 2 diabetes mellitus with hyperglycemia: Secondary | ICD-10-CM

## 2024-12-13 DIAGNOSIS — E782 Mixed hyperlipidemia: Secondary | ICD-10-CM | POA: Diagnosis present

## 2024-12-13 DIAGNOSIS — Z91041 Radiographic dye allergy status: Secondary | ICD-10-CM

## 2024-12-13 DIAGNOSIS — B9620 Unspecified Escherichia coli [E. coli] as the cause of diseases classified elsewhere: Secondary | ICD-10-CM | POA: Diagnosis present

## 2024-12-13 DIAGNOSIS — I25810 Atherosclerosis of coronary artery bypass graft(s) without angina pectoris: Secondary | ICD-10-CM | POA: Diagnosis present

## 2024-12-13 DIAGNOSIS — M109 Gout, unspecified: Secondary | ICD-10-CM | POA: Diagnosis present

## 2024-12-13 DIAGNOSIS — I10 Essential (primary) hypertension: Secondary | ICD-10-CM | POA: Diagnosis not present

## 2024-12-13 DIAGNOSIS — Z8249 Family history of ischemic heart disease and other diseases of the circulatory system: Secondary | ICD-10-CM

## 2024-12-13 DIAGNOSIS — T383X6A Underdosing of insulin and oral hypoglycemic [antidiabetic] drugs, initial encounter: Secondary | ICD-10-CM | POA: Diagnosis present

## 2024-12-13 DIAGNOSIS — R7989 Other specified abnormal findings of blood chemistry: Secondary | ICD-10-CM | POA: Diagnosis present

## 2024-12-13 DIAGNOSIS — Z79899 Other long term (current) drug therapy: Secondary | ICD-10-CM | POA: Diagnosis not present

## 2024-12-13 DIAGNOSIS — Z91138 Patient's unintentional underdosing of medication regimen for other reason: Secondary | ICD-10-CM

## 2024-12-13 DIAGNOSIS — Z6841 Body Mass Index (BMI) 40.0 and over, adult: Secondary | ICD-10-CM | POA: Diagnosis not present

## 2024-12-13 DIAGNOSIS — I110 Hypertensive heart disease with heart failure: Secondary | ICD-10-CM | POA: Diagnosis present

## 2024-12-13 DIAGNOSIS — E66812 Obesity, class 2: Secondary | ICD-10-CM | POA: Diagnosis present

## 2024-12-13 DIAGNOSIS — I214 Non-ST elevation (NSTEMI) myocardial infarction: Principal | ICD-10-CM | POA: Diagnosis present

## 2024-12-13 DIAGNOSIS — R739 Hyperglycemia, unspecified: Secondary | ICD-10-CM | POA: Diagnosis present

## 2024-12-13 DIAGNOSIS — I2510 Atherosclerotic heart disease of native coronary artery without angina pectoris: Secondary | ICD-10-CM | POA: Diagnosis present

## 2024-12-13 DIAGNOSIS — I252 Old myocardial infarction: Secondary | ICD-10-CM | POA: Diagnosis not present

## 2024-12-13 DIAGNOSIS — R058 Other specified cough: Secondary | ICD-10-CM

## 2024-12-13 DIAGNOSIS — Z953 Presence of xenogenic heart valve: Secondary | ICD-10-CM | POA: Diagnosis not present

## 2024-12-13 DIAGNOSIS — R079 Chest pain, unspecified: Secondary | ICD-10-CM | POA: Diagnosis present

## 2024-12-13 DIAGNOSIS — N390 Urinary tract infection, site not specified: Secondary | ICD-10-CM | POA: Diagnosis present

## 2024-12-13 DIAGNOSIS — Z743 Need for continuous supervision: Secondary | ICD-10-CM | POA: Diagnosis not present

## 2024-12-13 DIAGNOSIS — Z90710 Acquired absence of both cervix and uterus: Secondary | ICD-10-CM

## 2024-12-13 DIAGNOSIS — I4891 Unspecified atrial fibrillation: Secondary | ICD-10-CM | POA: Diagnosis not present

## 2024-12-13 DIAGNOSIS — K219 Gastro-esophageal reflux disease without esophagitis: Secondary | ICD-10-CM | POA: Diagnosis present

## 2024-12-13 DIAGNOSIS — Z9049 Acquired absence of other specified parts of digestive tract: Secondary | ICD-10-CM

## 2024-12-13 DIAGNOSIS — E1110 Type 2 diabetes mellitus with ketoacidosis without coma: Secondary | ICD-10-CM | POA: Diagnosis present

## 2024-12-13 DIAGNOSIS — Z7984 Long term (current) use of oral hypoglycemic drugs: Secondary | ICD-10-CM | POA: Diagnosis not present

## 2024-12-13 DIAGNOSIS — Z96642 Presence of left artificial hip joint: Secondary | ICD-10-CM | POA: Diagnosis present

## 2024-12-13 DIAGNOSIS — Z6839 Body mass index (BMI) 39.0-39.9, adult: Secondary | ICD-10-CM

## 2024-12-13 LAB — CBG MONITORING, ED
Glucose-Capillary: 477 mg/dL — ABNORMAL HIGH (ref 70–99)
Glucose-Capillary: 337 mg/dL — ABNORMAL HIGH (ref 70–99)
Glucose-Capillary: 343 mg/dL — ABNORMAL HIGH (ref 70–99)
Glucose-Capillary: 311 mg/dL — ABNORMAL HIGH (ref 70–99)
Glucose-Capillary: 288 mg/dL — ABNORMAL HIGH (ref 70–99)
Glucose-Capillary: 274 mg/dL — ABNORMAL HIGH (ref 70–99)
Glucose-Capillary: 200 mg/dL — ABNORMAL HIGH (ref 70–99)
Glucose-Capillary: 155 mg/dL — ABNORMAL HIGH (ref 70–99)

## 2024-12-13 LAB — CBC
HCT: 43.1 % (ref 36.0–46.0)
Hemoglobin: 14 g/dL (ref 12.0–15.0)
MCH: 27.6 pg (ref 26.0–34.0)
MCHC: 32.5 g/dL (ref 30.0–36.0)
MCV: 84.8 fL (ref 80.0–100.0)
Platelets: 251 K/uL (ref 150–400)
RBC: 5.08 MIL/uL (ref 3.87–5.11)
RDW: 14.5 % (ref 11.5–15.5)
WBC: 9.3 K/uL (ref 4.0–10.5)
nRBC: 0 % (ref 0.0–0.2)

## 2024-12-13 LAB — URINALYSIS, ROUTINE W REFLEX MICROSCOPIC
Bacteria, UA: NONE SEEN
Bilirubin Urine: NEGATIVE
Glucose, UA: >=500 mg/dL — AB
Hgb urine dipstick: NEGATIVE
Ketones, ur: NEGATIVE mg/dL
Leukocytes,Ua: NEGATIVE
Nitrite: NEGATIVE
Protein, ur: NEGATIVE mg/dL
Specific Gravity, Urine: 1.007 (ref 1.005–1.030)
pH: 5 (ref 5.0–8.0)

## 2024-12-13 LAB — BASIC METABOLIC PANEL WITH GFR
Anion gap: 19 — ABNORMAL HIGH (ref 5–15)
Anion gap: 17 — ABNORMAL HIGH (ref 5–15)
BUN: 25 mg/dL — ABNORMAL HIGH (ref 8–23)
BUN: 23 mg/dL (ref 8–23)
CO2: 16 mmol/L — ABNORMAL LOW (ref 22–32)
CO2: 17 mmol/L — ABNORMAL LOW (ref 22–32)
Calcium: 9.1 mg/dL (ref 8.9–10.3)
Calcium: 8.8 mg/dL — ABNORMAL LOW (ref 8.9–10.3)
Chloride: 100 mmol/L (ref 98–111)
Chloride: 103 mmol/L (ref 98–111)
Creatinine, Ser: 1.15 mg/dL — ABNORMAL HIGH (ref 0.44–1.00)
Creatinine, Ser: 1.03 mg/dL — ABNORMAL HIGH (ref 0.44–1.00)
GFR, Estimated: 51 mL/min — ABNORMAL LOW
GFR, Estimated: 58 mL/min — ABNORMAL LOW
Glucose, Bld: 533 mg/dL (ref 70–99)
Glucose, Bld: 380 mg/dL — ABNORMAL HIGH (ref 70–99)
Potassium: 3.8 mmol/L (ref 3.5–5.1)
Potassium: 3.8 mmol/L (ref 3.5–5.1)
Sodium: 135 mmol/L (ref 135–145)
Sodium: 136 mmol/L (ref 135–145)

## 2024-12-13 LAB — BLOOD GAS, VENOUS
Acid-base deficit: 2.2 mmol/L — ABNORMAL HIGH (ref 0.0–2.0)
Bicarbonate: 22.4 mmol/L (ref 20.0–28.0)
Drawn by: 4237
O2 Saturation: 94.4 %
Patient temperature: 36.6
pCO2, Ven: 36 mmHg — ABNORMAL LOW (ref 44–60)
pH, Ven: 7.4 (ref 7.25–7.43)
pO2, Ven: 65 mmHg — ABNORMAL HIGH (ref 32–45)

## 2024-12-13 LAB — PROTIME-INR
INR: 1.2 (ref 0.8–1.2)
Prothrombin Time: 15.8 s — ABNORMAL HIGH (ref 11.4–15.2)

## 2024-12-13 LAB — APTT: aPTT: 135 s — ABNORMAL HIGH (ref 24–36)

## 2024-12-13 LAB — TROPONIN T, HIGH SENSITIVITY
Troponin T High Sensitivity: 21 ng/L — ABNORMAL HIGH (ref 0–19)
Troponin T High Sensitivity: 54 ng/L — ABNORMAL HIGH (ref 0–19)
Troponin T High Sensitivity: 102 ng/L (ref 0–19)

## 2024-12-13 MED ORDER — LACTATED RINGERS IV SOLN: INTRAVENOUS | Status: DC

## 2024-12-13 MED ORDER — HEPARIN BOLUS VIA INFUSION
4000.0000 [IU] | Freq: Once | INTRAVENOUS | Status: AC
  Administered 2024-12-13: 4000 [IU] via INTRAVENOUS

## 2024-12-13 MED ORDER — DEXTROSE 50 % IV SOLN: 0.0000 mL | INTRAVENOUS | Status: DC | PRN

## 2024-12-13 MED ORDER — ONDANSETRON HCL 4 MG/2ML IJ SOLN: 4.0000 mg | Freq: Four times a day (QID) | INTRAMUSCULAR | Status: DC | PRN

## 2024-12-13 MED ORDER — LACTATED RINGERS IV BOLUS
1000.0000 mL | Freq: Once | INTRAVENOUS | Status: AC
  Administered 2024-12-13: 1000 mL via INTRAVENOUS

## 2024-12-13 MED ORDER — DEXTROSE IN LACTATED RINGERS 5 % IV SOLN: INTRAVENOUS | Status: DC

## 2024-12-13 MED ORDER — ONDANSETRON HCL 4 MG PO TABS: 4.0000 mg | ORAL_TABLET | Freq: Four times a day (QID) | ORAL | Status: DC | PRN

## 2024-12-13 MED ORDER — INSULIN ASPART 100 UNIT/ML IJ SOLN
5.0000 [IU] | Freq: Once | INTRAMUSCULAR | Status: AC
  Administered 2024-12-13: 5 [IU] via INTRAVENOUS
  Filled 2024-12-13: qty 1

## 2024-12-13 MED ORDER — ACETAMINOPHEN 325 MG PO TABS: 650.0000 mg | ORAL_TABLET | Freq: Four times a day (QID) | ORAL | Status: DC | PRN

## 2024-12-13 MED ORDER — ACETAMINOPHEN 650 MG RE SUPP: 650.0000 mg | Freq: Four times a day (QID) | RECTAL | Status: DC | PRN

## 2024-12-13 MED ORDER — ASPIRIN 81 MG PO CHEW
324.0000 mg | CHEWABLE_TABLET | Freq: Once | ORAL | Status: AC
  Administered 2024-12-13: 324 mg via ORAL
  Filled 2024-12-13: qty 4

## 2024-12-13 MED ORDER — SODIUM CHLORIDE 0.9 % IV BOLUS
1000.0000 mL | Freq: Once | INTRAVENOUS | Status: AC
  Administered 2024-12-13: 1000 mL via INTRAVENOUS

## 2024-12-13 MED ORDER — INSULIN REGULAR(HUMAN) IN NACL 100-0.9 UT/100ML-% IV SOLN
INTRAVENOUS | Status: DC
  Administered 2024-12-13: 16 [IU]/h via INTRAVENOUS
  Filled 2024-12-13: qty 100

## 2024-12-13 MED ORDER — HEPARIN (PORCINE) 25000 UT/250ML-% IV SOLN
900.0000 [IU]/h | INTRAVENOUS | Status: DC
  Administered 2024-12-13: 900 [IU]/h via INTRAVENOUS
  Administered 2024-12-14: 1150 [IU]/h via INTRAVENOUS
  Filled 2024-12-13 (×2): qty 250

## 2024-12-13 MED ORDER — ATORVASTATIN CALCIUM 80 MG PO TABS
80.0000 mg | ORAL_TABLET | Freq: Every day | ORAL | Status: DC
  Administered 2024-12-14 – 2024-12-16 (×3): 80 mg via ORAL
  Filled 2024-12-13 (×3): qty 1

## 2024-12-13 MED ORDER — ASPIRIN 81 MG PO TBEC
81.0000 mg | DELAYED_RELEASE_TABLET | Freq: Every day | ORAL | Status: DC
  Administered 2024-12-14 – 2024-12-16 (×3): 81 mg via ORAL
  Filled 2024-12-13 (×3): qty 1

## 2024-12-13 MED ORDER — INSULIN ASPART 100 UNIT/ML IJ SOLN
10.0000 [IU] | Freq: Once | INTRAMUSCULAR | Status: AC
  Administered 2024-12-13: 10 [IU] via INTRAVENOUS
  Filled 2024-12-13: qty 1

## 2024-12-13 NOTE — H&P (Signed)
 " History and Physical    Patient: Karina Green FMW:[id number] DOB: Jul 04, 1956 DOA: 12/13/2024 DOS: the patient was seen and examined on 12/13/2024 PCP: Pcp, No  Patient coming from: {Point_of_Origin:[address]}  Chief Complaint:  Chief Complaint  Patient presents with   Chest Pain   HPI: Karina Green is a 68 y.o. female with medical history significant of ***  Review of Systems: {ROS_Text:26778} Past Medical History:  Diagnosis Date   Arthritis    Back pain    CAD (coronary artery disease), native coronary artery-70% LM stenosis 10/25/2014   Cough last 30 yrs    nonproductive, no fevers   DM type 2 (diabetes mellitus, type 2) (HCC) 10/25/2014   GERD (gastroesophageal reflux disease)    Gout    Hyperlipidemia    Hypertension    S/P TAVR (transcatheter aortic valve replacement) 02/10/2022   s/p TAVR with 29 mm Medtronic Evolut Fx via the TF approach by Dr. Verlin & Dr. Lucas   Severe aortic stenosis    Past Surgical History:  Procedure Laterality Date   ABDOMINAL HYSTERECTOMY     complete   APPENDECTOMY     bladder tack     CARDIAC CATHETERIZATION  10/25/14   70% LM stensis, normal EF   CHOLECYSTECTOMY     CORONARY ARTERY BYPASS GRAFT N/A 10/29/2014   Procedure: CORONARY ARTERY BYPASS GRAFTING (CABG) TIMES TWO USING LEFT INTERNAL MAMMARY ARTERY AND RIGHT LEG GREATER SAPHENOUS VEIN HARVESTED ENDOSCOPICALLY.;  Surgeon: Maude Fleeta Ochoa, MD;  Location: [facility] OR;  Service: Open Heart Surgery;  Laterality: N/A;   HERNIA REPAIR     INTRAOPERATIVE TRANSESOPHAGEAL ECHOCARDIOGRAM N/A 10/29/2014   Procedure: INTRAOPERATIVE TRANSESOPHAGEAL ECHOCARDIOGRAM;  Surgeon: Maude Fleeta Ochoa, MD;  Location: [facility] OR;  Service: Open Heart Surgery;  Laterality: N/A;   INTRAOPERATIVE TRANSTHORACIC ECHOCARDIOGRAM N/A 02/10/2022   Procedure: INTRAOPERATIVE TRANSTHORACIC ECHOCARDIOGRAM;  Surgeon: Verlin Lonni BIRCH, MD;  Location: [facility] OR;  Service: Open Heart Surgery;  Laterality: N/A;   JOINT REPLACEMENT  Right 2009   hip total   LEFT HEART CATHETERIZATION WITH CORONARY ANGIOGRAM N/A 10/26/2014   Procedure: LEFT HEART CATHETERIZATION WITH CORONARY ANGIOGRAM;  Surgeon: Debby DELENA Sor, MD;  Location: [facility] CATH LAB;  Service: Cardiovascular;  Laterality: N/A;   RIGHT/LEFT HEART CATH AND CORONARY/GRAFT ANGIOGRAPHY N/A 12/29/2021   Procedure: RIGHT/LEFT HEART CATH AND CORONARY/GRAFT ANGIOGRAPHY;  Surgeon: Wonda Sharper, MD;  Location: [facility] INVASIVE CV LAB;  Service: Cardiovascular;  Laterality: N/A;   TONSILLECTOMY     TOTAL HIP ARTHROPLASTY     TOTAL HIP ARTHROPLASTY Left 05/07/2016   Procedure: TOTAL HIP ARTHROPLASTY ANTERIOR APPROACH;  Surgeon: Redell Shoals, MD;  Location: [facility] ORS;  Service: Orthopedics;  Laterality: Left;   TRANSCATHETER AORTIC VALVE REPLACEMENT, TRANSFEMORAL N/A 02/10/2022   Procedure: TRANSCATHETER AORTIC VALVE REPLACEMENT, TRANSFEMORAL USING A MEDTRONIC EVOLUT FX TRANSCATHETER AORTIC VALVE.;  Surgeon: Verlin Lonni BIRCH, MD;  Location: [facility] OR;  Service: Open Heart Surgery;  Laterality: N/A;   Social History:  reports that she has never smoked. She has never used smokeless tobacco. She reports that she does not drink alcohol  and does not use drugs.  Allergies[1]  Family History  Problem Relation Age of Onset   Hypertension Mother    Coronary artery disease Mother        CABG   Hypertension Father    CAD Father        CABG   Coronary artery disease Brother        Stent  Prior to Admission medications  Medication Sig Start Date End Date Taking? Authorizing Provider  albuterol  (VENTOLIN  HFA) 108 (90 Base) MCG/ACT inhaler Inhale 2 puffs into the lungs every 4 (four) hours as needed for wheezing or shortness of breath. 02/27/23  Yes Stuart Vernell Norris, PA-C  allopurinol  (ZYLOPRIM ) 100 MG tablet Take 100 mg by mouth daily. 09/16/17  Yes [provider]  aspirin  EC 81 MG tablet Take 81 mg by mouth daily.   Yes [provider]  atorvastatin  (LIPITOR )  80 MG tablet TAKE ONE TABLET BY MOUTH ONCE DAILY AT 6 P.M. Patient taking differently: Take 80 mg by mouth daily. 08/09/24  Yes BranchDorn FALCON, MD  cephALEXin  (KEFLEX ) 500 MG capsule Take 1 capsule (500 mg total) by mouth 2 (two) times daily. 12/11/24  Yes Stuart Vernell Norris, PA-C  famotidine  (PEPCID ) 20 MG tablet One daily after supper 02/29/24  Yes Darlean Ozell NOVAK, MD  furosemide  (LASIX ) 20 MG tablet Take 1 tablet (20 mg total) by mouth daily. 03/27/16  Yes BranchDorn FALCON, MD  glimepiride  (AMARYL ) 2 MG tablet Take 1 tablet (2 mg total) by mouth See admin instructions. 1 mg in the morning, 2 mg at bedtime 10/25/24  Yes Stuart Vernell Norris, PA-C  isosorbide  mononitrate (IMDUR ) 30 MG 24 hr tablet Take 0.5 tablets (15 mg total) by mouth daily. 08/09/24  Yes BranchDorn FALCON, MD  losartan  (COZAAR ) 25 MG tablet Take 0.5 tablets (12.5 mg total) by mouth daily. 08/09/24  Yes BranchDorn FALCON, MD  metoprolol  tartrate (LOPRESSOR ) 25 MG tablet Take 1 tablet (25 mg total) by mouth 2 (two) times daily. 08/09/24  Yes BranchDorn FALCON, MD  Multiple Vitamin (MULTIVITAMIN WITH MINERALS) TABS tablet Take 1 tablet by mouth daily.   Yes [provider]  Omega-3 Fatty Acids (FISH OIL) 1000 MG CAPS Take 1,000 mg by mouth daily.   Yes [provider]  pantoprazole  (PROTONIX ) 40 MG tablet Take 1 tablet (40 mg total) by mouth daily. Take 30-60 min before first meal of the day 02/29/24  Yes Darlean Ozell NOVAK, MD  Potassium Chloride  ER 20 MEQ TBCR Take 1 tablet by mouth daily. 09/19/24  Yes [provider]  predniSONE  (DELTASONE ) 20 MG tablet Take 2 tablets (40 mg total) by mouth daily with breakfast. Patient taking differently: Take 20 mg by mouth daily with breakfast. 12/11/24  Yes Stuart Vernell Norris, [place]    Physical Exam: Vitals:   12/13/24 1900 12/13/24 1954 12/13/24 2000 12/13/24 2030  BP: (!) 150/96 (!) 164/99 (!) 173/76 (!) 160/81  Pulse:  74 71   Resp: (!) 23 17 19 18    Temp:      TempSrc:      SpO2:  95% 95%   Weight:      Height:       *** Data Reviewed: {Tip this will not be part of the note when signed- Document your independent interpretation of telemetry tracing, EKG, lab, Radiology test or any other diagnostic tests. Add any new diagnostic test ordered today. (Optional):26781} {Results:26384}  Assessment and Plan: No notes have been filed under this hospital service. Service: Hospitalist     Advance Care Planning:   Code Status: Prior ***  Consults: ***  Family Communication: ***  Severity of Illness: {Observation/Inpatient:21159}  Author: Posey Maier, DO 12/13/2024 9:14 PM  For on call review [email or web address].     [1]  Allergies Allergen Reactions   Iodinated Contrast Media Rash    Very large itchy rash  within 24 hours of heart cath    "

## 2024-12-13 NOTE — Consult Note (Signed)
 PHARMACY - ANTICOAGULATION CONSULT NOTE  Pharmacy Consult for Heparin  Indication: chest pain/ACS  Allergies[1]  Patient Measurements: Height: 5' 3 (160 cm) Weight: 102.5 kg (226 lb) IBW/kg (Calculated) : 52.4 HEPARIN  DW (KG): 76.6  Vital Signs: Temp: 97.9 F (36.6 C) (12/24 1802) Temp Source: Oral (12/24 1802) BP: 160/81 (12/24 2030) Pulse Rate: 71 (12/24 2000)  Labs: Recent Labs    12/13/24 1359 12/13/24 1641  HGB 14.0  --   HCT 43.1  --   PLT 251  --   CREATININE 1.15* 1.03*    Estimated Creatinine Clearance: 58.1 mL/min (A) (by C-G formula based on SCr of 1.03 mg/dL (H)).   Medical History: Past Medical History:  Diagnosis Date   Arthritis    Back pain    CAD (coronary artery disease), native coronary artery-70% LM stenosis 10/25/2014   Cough last 30 yrs    nonproductive, no fevers   DM type 2 (diabetes mellitus, type 2) (HCC) 10/25/2014   GERD (gastroesophageal reflux disease)    Gout    Hyperlipidemia    Hypertension    S/P TAVR (transcatheter aortic valve replacement) 02/10/2022   s/p TAVR with 29 mm Medtronic Evolut Fx via the TF approach by Dr. Verlin & Dr. Lucas   Severe aortic stenosis     Medications:  No history of chronic anticoagulant use PTA.  Assessment: 68 y.o. female presents after initially going to urgent care with concern for chest pain. Troponin levels of 21>54>102. Pharmacy has been consulted to initiate and dose continuous heparin  infusion. Baseline labs have been ordered and are pending.   Goal of Therapy:  Heparin  level 0.3-0.7 units/ml Monitor platelets by anticoagulation protocol: Yes   Plan:  Give 4000 units bolus x 1 Start heparin  infusion at 900 units/hr Check anti-Xa level in 6 hours and daily while on heparin  Continue to monitor H&H and platelets  Porsche Noguchi A China Deitrick 12/13/2024,8:56 PM      [1]  Allergies Allergen Reactions   Iodinated Contrast Media Rash    Very large itchy rash within 24 hours of heart  cath

## 2024-12-13 NOTE — Consult Note (Incomplete)
 "  Cardiology Consultation   Patient ID: DEBI COUSIN MRN: [id number]; DOB: Sep 11, 1956  Admit date: 12/13/2024 Date of Consult: 12/14/2024  PCP:  Freddrick Johns   [facility] Providers Cardiologist:  Alvan Carrier, MD        Patient Profile: Karina Green is a 68 y.o. female with a hx of CAD s/p 2v CABG with LIMA-LAD (patent as of 12/2021) and SVG to OM1 (occluded) in 2015, severe AS  s/p TAVR in 01/2022, hyperlipidemia, hypertension, T2DM, who is being seen 12/14/2024 for the evaluation of chest pain and elevated troponin levels.   History of Present Illness: Ms. Duford reports that for the past 2 weeks, he has been having intermittent episodes of exertional, pressure-like chest pain across her entire chest area.  These episodes were very brief and and would last for less than a minute.  Today, after preparing breakfast and while sitting down, she started to have a similar but more severe episode of chest pain that lasted for about 30 minutes and resolved spontaneously.  No associated shortness of breath, nausea, vomiting, diaphoresis, palpitation, or presyncope.  No prior similar episodes.   Of note, she presented to an urgent care on the 12/22 dysuria and left hand pain.  She was discharged on Keflex  for possible UTI and prednisone  for concern of gout.  She has been taking all her medications except for her antidiabetic medications as she ran out of them about a month ago and her PCP had closed their practice, and she was not able to get refills.  In the ED today, BP was elevated up to 170s/70s.  She was found to be hyperglycemic to the 500s with anion gap of 19, concerning for DKA, for which she was started on insulin  gtt. EKG showed normal sinus rhythm with anterolateral TWI that are chronic and seen on prior EKGs. Troponin 21= >54 =>102.  Past Medical History:  Diagnosis Date   Arthritis    Back pain    CAD (coronary artery disease), native coronary artery-70% LM stenosis  10/25/2014   Cough last 30 yrs    nonproductive, no fevers   DM type 2 (diabetes mellitus, type 2) (HCC) 10/25/2014   GERD (gastroesophageal reflux disease)    Gout    Hyperlipidemia    Hypertension    S/P TAVR (transcatheter aortic valve replacement) 02/10/2022   s/p TAVR with 29 mm Medtronic Evolut Fx via the TF approach by Dr. Verlin & Dr. Lucas   Severe aortic stenosis     Past Surgical History:  Procedure Laterality Date   ABDOMINAL HYSTERECTOMY     complete   APPENDECTOMY     bladder tack     CARDIAC CATHETERIZATION  10/25/14   70% LM stensis, normal EF   CHOLECYSTECTOMY     CORONARY ARTERY BYPASS GRAFT N/A 10/29/2014   Procedure: CORONARY ARTERY BYPASS GRAFTING (CABG) TIMES TWO USING LEFT INTERNAL MAMMARY ARTERY AND RIGHT LEG GREATER SAPHENOUS VEIN HARVESTED ENDOSCOPICALLY.;  Surgeon: Maude Fleeta Ochoa, MD;  Location: [facility] OR;  Service: Open Heart Surgery;  Laterality: N/A;   HERNIA REPAIR     INTRAOPERATIVE TRANSESOPHAGEAL ECHOCARDIOGRAM N/A 10/29/2014   Procedure: INTRAOPERATIVE TRANSESOPHAGEAL ECHOCARDIOGRAM;  Surgeon: Maude Fleeta Ochoa, MD;  Location: [facility] OR;  Service: Open Heart Surgery;  Laterality: N/A;   INTRAOPERATIVE TRANSTHORACIC ECHOCARDIOGRAM N/A 02/10/2022   Procedure: INTRAOPERATIVE TRANSTHORACIC ECHOCARDIOGRAM;  Surgeon: Verlin Lonni BIRCH, MD;  Location: [facility] OR;  Service: Open Heart Surgery;  Laterality: N/A;   JOINT REPLACEMENT Right  2009   hip total   LEFT HEART CATHETERIZATION WITH CORONARY ANGIOGRAM N/A 10/26/2014   Procedure: LEFT HEART CATHETERIZATION WITH CORONARY ANGIOGRAM;  Surgeon: Debby DELENA Sor, MD;  Location: [facility] CATH LAB;  Service: Cardiovascular;  Laterality: N/A;   RIGHT/LEFT HEART CATH AND CORONARY/GRAFT ANGIOGRAPHY N/A 12/29/2021   Procedure: RIGHT/LEFT HEART CATH AND CORONARY/GRAFT ANGIOGRAPHY;  Surgeon: Wonda Sharper, MD;  Location: [facility] INVASIVE CV LAB;  Service: Cardiovascular;  Laterality: N/A;   TONSILLECTOMY     TOTAL HIP ARTHROPLASTY      TOTAL HIP ARTHROPLASTY Left 05/07/2016   Procedure: TOTAL HIP ARTHROPLASTY ANTERIOR APPROACH;  Surgeon: Redell Shoals, MD;  Location: [facility] ORS;  Service: Orthopedics;  Laterality: Left;   TRANSCATHETER AORTIC VALVE REPLACEMENT, TRANSFEMORAL N/A 02/10/2022   Procedure: TRANSCATHETER AORTIC VALVE REPLACEMENT, TRANSFEMORAL USING A MEDTRONIC EVOLUT FX TRANSCATHETER AORTIC VALVE.;  Surgeon: Verlin Lonni BIRCH, MD;  Location: [facility] OR;  Service: Open Heart Surgery;  Laterality: N/A;     Home Medications:  Prior to Admission medications  Medication Sig Start Date End Date Taking? Authorizing Provider  albuterol  (VENTOLIN  HFA) 108 (90 Base) MCG/ACT inhaler Inhale 2 puffs into the lungs every 4 (four) hours as needed for wheezing or shortness of breath. 02/27/23  Yes Stuart Vernell Norris, PA-C  allopurinol  (ZYLOPRIM ) 100 MG tablet Take 100 mg by mouth daily. 09/16/17  Yes [provider]  aspirin  EC 81 MG tablet Take 81 mg by mouth daily.   Yes [provider]  atorvastatin  (LIPITOR ) 80 MG tablet TAKE ONE TABLET BY MOUTH ONCE DAILY AT 6 P.M. Patient taking differently: Take 80 mg by mouth daily. 08/09/24  Yes BranchDorn FALCON, MD  cephALEXin  (KEFLEX ) 500 MG capsule Take 1 capsule (500 mg total) by mouth 2 (two) times daily. 12/11/24  Yes Stuart Vernell Norris, PA-C  famotidine  (PEPCID ) 20 MG tablet One daily after supper 02/29/24  Yes Darlean Sharper NOVAK, MD  furosemide  (LASIX ) 20 MG tablet Take 1 tablet (20 mg total) by mouth daily. 03/27/16  Yes BranchDorn FALCON, MD  glimepiride  (AMARYL ) 2 MG tablet Take 1 tablet (2 mg total) by mouth See admin instructions. 1 mg in the morning, 2 mg at bedtime 10/25/24  Yes Stuart Vernell Norris, PA-C  isosorbide  mononitrate (IMDUR ) 30 MG 24 hr tablet Take 0.5 tablets (15 mg total) by mouth daily. 08/09/24  Yes BranchDorn FALCON, MD  losartan  (COZAAR ) 25 MG tablet Take 0.5 tablets (12.5 mg total) by mouth daily. 08/09/24  Yes BranchDorn FALCON, MD   metoprolol  tartrate (LOPRESSOR ) 25 MG tablet Take 1 tablet (25 mg total) by mouth 2 (two) times daily. 08/09/24  Yes BranchDorn FALCON, MD  Multiple Vitamin (MULTIVITAMIN WITH MINERALS) TABS tablet Take 1 tablet by mouth daily.   Yes [provider]  Omega-3 Fatty Acids (FISH OIL) 1000 MG CAPS Take 1,000 mg by mouth daily.   Yes [provider]  pantoprazole  (PROTONIX ) 40 MG tablet Take 1 tablet (40 mg total) by mouth daily. Take 30-60 min before first meal of the day 02/29/24  Yes Darlean Sharper NOVAK, MD  Potassium Chloride  ER 20 MEQ TBCR Take 1 tablet by mouth daily. 09/19/24  Yes [provider]  predniSONE  (DELTASONE ) 20 MG tablet Take 2 tablets (40 mg total) by mouth daily with breakfast. Patient taking differently: Take 20 mg by mouth daily with breakfast. 12/11/24  Yes Stuart Vernell Norris, PA-C    Scheduled Meds:  aspirin  EC  81 mg Oral Daily   atorvastatin   80 mg Oral Daily   isosorbide  mononitrate  30 mg Oral Daily   losartan   25 mg Oral Daily   metoprolol  tartrate  25 mg Oral BID   Continuous Infusions:  dextrose  5% lactated ringers  125 mL/hr at 12/13/24 2315   heparin  900 Units/hr (12/13/24 2315)   insulin  3 Units/hr (12/14/24 0011)   lactated ringers  Stopped (12/13/24 2225)   PRN Meds: acetaminophen  **OR** acetaminophen , dextrose , ondansetron  **OR** ondansetron  (ZOFRAN ) IV  Allergies:   Allergies[1]  Social History:   Social History   Socioeconomic History   Marital status: Divorced    Spouse name: Not on file   Number of children: Not on file   Years of education: Not on file   Highest education level: Not on file  Occupational History   Not on file  Tobacco Use   Smoking status: Never   Smokeless tobacco: Never  Vaping Use   Vaping status: Never Used  Substance and Sexual Activity   Alcohol  use: No    Alcohol /week: 0.0 standard drinks of alcohol    Drug use: No   Sexual activity: Not on file  Other Topics Concern   Not on file   Social History Narrative   Not on file   Social Drivers of Health   Tobacco Use: Low Risk (12/13/2024)   Patient History    Smoking Tobacco Use: Never    Smokeless Tobacco Use: Never    Passive Exposure: Not on file  Financial Resource Strain: Not on file  Food Insecurity: Not on file  Transportation Needs: Not on file  Physical Activity: Not on file  Stress: Not on file  Social Connections: Not on file  Intimate Partner Violence: Not on file  Depression (EYV7-0): Not on file  Alcohol  Screen: Not on file  Housing: Not on file  Utilities: Not on file  Health Literacy: Not on file    Family History:   Family History  Problem Relation Age of Onset   Hypertension Mother    Coronary artery disease Mother        CABG   Hypertension Father    CAD Father        CABG   Coronary artery disease Brother        Stent     ROS:  Please see the history of present illness.   All other ROS reviewed and negative.     Physical Exam/Data: Vitals:   12/13/24 2203 12/13/24 2227 12/13/24 2300 12/14/24 0000  BP: (!) 182/75  (!) 142/56 (!) 175/68  Pulse:   (!) 54 68  Resp: 19  16 17   Temp:  98 F (36.7 C)  98 F (36.7 C)  TempSrc:  Oral  Oral  SpO2:   97% 97%  Weight:      Height:        Intake/Output Summary (Last 24 hours) at 12/14/2024 0047 Last data filed at 12/13/2024 2315 Gross per 24 hour  Intake 296.51 ml  Output --  Net 296.51 ml      12/13/2024    1:20 PM 03/21/2024   11:27 AM 02/29/2024    8:37 AM  Last 3 Weights  Weight (lbs) 226 lb 225 lb 12.8 oz 230 lb 9.6 oz  Weight (kg) 102.513 kg 102.422 kg 104.599 kg     Body mass index is 40.03 kg/m.  General:  Well nourished, well developed, in no acute distress HEENT: normal Neck: no JVD Vascular: No carotid bruits; Distal pulses 2+ bilaterally Cardiac:  normal S1,  S2; RRR; no murmur  Lungs:  clear to auscultation bilaterally, no wheezing, rhonchi or rales  Abd: soft, nontender, no hepatomegaly  Ext: no  edema Musculoskeletal:  No deformities, BUE and BLE strength normal and equal Skin: warm and dry  Neuro:  CNs 2-12 intact, no focal abnormalities noted Psych:  Normal affect   EKG:  The EKG was personally reviewed and demonstrates: normal sinus rhythm with anterolateral TWI that are chronic and seen on a prior EKG  Relevant CV Studies: TTE 01/2023:  1. Left ventricular ejection fraction, by estimation, is 60 to 65%. The  left ventricle has normal function. The left ventricle has no regional  wall motion abnormalities. Left ventricular diastolic parameters are  consistent with Grade II diastolic  dysfunction (pseudonormalization). The average left ventricular global  longitudinal strain is -23.8 %. The global longitudinal strain is normal.   2. Right ventricular systolic function is normal. The right ventricular  size is normal. There is normal pulmonary artery systolic pressure. The  estimated right ventricular systolic pressure is 27.2 mmHg.   3. The mitral valve is degenerative. Mild mitral valve regurgitation.   4. The aortic valve has been repaired/replaced. Aortic valve  regurgitation is not visualized. There is a 29 mm Medtronic Evolut FX TAVR  valve present in the aortic position. Aortic valve mean gradient measures  5.5 mmHg.   5. The inferior vena cava is normal in size with greater than 50%  respiratory variability, suggesting right atrial pressure of 3 mmHg.   L/RHC 12/2021:   Prox RCA lesion is 40% stenosed.   Mid RCA lesion is 40% stenosed.   Dist LM lesion is 70% stenosed.   Ost Cx lesion is 50% stenosed.   Ost LAD to Prox LAD lesion is 75% stenosed.   Origin to Prox Graft lesion is 100% stenosed.   1.  Moderately severe 70% distal left main stenosis 2.  Severe 75% proximal LAD stenosis 3.  Moderate 50% proximal circumflex stenosis 4.  Patent RCA with mild plaquing, dominant vessel 5. Continued patency of the LIMA-LAD 6. Chronic occlusion of the SVG-OM 7.  Severe  calcific aortic stenosis with peak to peak gradient of 66 mmHg. 8.  Mildly elevated right heart pressures, suspect secondary to left heart disease   Recommendations: Medical therapy for CAD, TAVR evaluation for treatment of severe aortic stenosis.    Laboratory Data: High Sensitivity Troponin:  No results for input(s): TROPONINIHS in the last 720 hours.  Recent Labs  Lab 12/13/24 1359 12/13/24 1641 12/13/24 1859  TRNPT 21* 54* 102*      Chemistry Recent Labs  Lab 12/13/24 1359 12/13/24 1641  NA 135 136  K 3.8 3.8  CL 100 103  CO2 16* 17*  GLUCOSE 533* 380*  BUN 25* 23  CREATININE 1.15* 1.03*  CALCIUM  9.1 8.8*  GFRNONAA 51* 58*  ANIONGAP 19* 17*    No results for input(s): PROT, ALBUMIN , AST, ALT, ALKPHOS, BILITOT in the last 168 hours. Lipids No results for input(s): CHOL, TRIG, HDL, LABVLDL, LDLCALC, CHOLHDL in the last 168 hours.  Hematology Recent Labs  Lab 12/13/24 1359  WBC 9.3  RBC 5.08  HGB 14.0  HCT 43.1  MCV 84.8  MCH 27.6  MCHC 32.5  RDW 14.5  PLT 251   Thyroid  No results for input(s): TSH, FREET4 in the last 168 hours.  BNPNo results for input(s): BNP, PROBNP in the last 168 hours.  DDimer No results for input(s): DDIMER in the last 168 hours.  Radiology/Studies:  DG Chest Portable 1 View Result Date: 12/13/2024 CLINICAL DATA:  Chest pain EXAM: PORTABLE CHEST 1 VIEW COMPARISON:  February 06, 2022 FINDINGS: The cardiomediastinal silhouette is unchanged and enlarged in contour.Post median sternotomy and TAVR. No pleural effusion. No pneumothorax. No focal consolidation. Similar appearance of mild reticular prominence and peribronchial cuffing. IMPRESSION: Similar appearance of mild reticular prominence and peribronchial cuffing which may reflect mild pulmonary edema or chronic small airways disease. Electronically Signed   By: Corean Salter M.D.   On: 12/13/2024 14:02     Assessment and Plan: NSTEMI 2 weeks  of intermittent, brief episodes of exertional chest pain (each episode <1 minute). Presents today after an episode that occurred while resting and lasted for 30 minutes. Troponin 21= >54 =>102. EKG with anterolateral TWI that are chronic and seen on prior EKGs. Of note, this is in the setting of hyperglycemia and DKA as she's been out of her antidiabetic medications for over a month and with recent initiation of prednisone  for gout.  Overall, her presentation with stuttering angina and now with a longer episode is concerning for NSTEMI.   - S/p ASA 324 mg; continue 81 mg daily - Continue heparin  gtt - Continue atorvastatin  80 mg daily - TTE in the AM - Check lipid panel and A1C - Will likely need coronary angiogram  - Increase home Imdur  from 15 mg daily to 30 mg daily  - Continue losartan  at increased dose 25 mg (from 12.5 mg daily) and metoprolol  tartrate 25 mg BID      For questions or updates, please contact [facility] Please consult [email or web address] for contact info under      Signed, Gillian CHRISTELLA Cass, MD  12/14/2024 12:47 AM      [1]  Allergies Allergen Reactions   Iodinated Contrast Media Rash    Very large itchy rash within 24 hours of heart cath    "

## 2024-12-13 NOTE — ED Provider Notes (Signed)
 " [place] EMERGENCY DEPARTMENT AT [facility] Provider Note   CSN: [id number] Arrival date & time: 12/13/24  1308     Patient presents with: Chest Pain   Karina Green is a 68 y.o. female.   HPI Patient presents after initially going to urgent care with concern for chest pain.  Patient has multiple medical problems, notably diabetes and ran out of that medication about 1 month ago.  Over the past few days she has had episodic chest pain, described as burning, sternal, anterior only.  No dyspnea, no syncope, no vomiting. She is here with family members who assist with the history.    Prior to Admission medications  Medication Sig Start Date End Date Taking? Authorizing Provider  albuterol  (VENTOLIN  HFA) 108 (90 Base) MCG/ACT inhaler Inhale 2 puffs into the lungs every 4 (four) hours as needed for wheezing or shortness of breath. 02/27/23  Yes Stuart Vernell Norris, PA-C  allopurinol  (ZYLOPRIM ) 100 MG tablet Take 100 mg by mouth daily. 09/16/17  Yes [provider]  aspirin  EC 81 MG tablet Take 81 mg by mouth daily.   Yes [provider]  atorvastatin  (LIPITOR ) 80 MG tablet TAKE ONE TABLET BY MOUTH ONCE DAILY AT 6 P.M. Patient taking differently: Take 80 mg by mouth daily. 08/09/24  Yes BranchDorn FALCON, MD  cephALEXin  (KEFLEX ) 500 MG capsule Take 1 capsule (500 mg total) by mouth 2 (two) times daily. 12/11/24  Yes Stuart Vernell Norris, PA-C  famotidine  (PEPCID ) 20 MG tablet One daily after supper 02/29/24  Yes Darlean Ozell NOVAK, MD  furosemide  (LASIX ) 20 MG tablet Take 1 tablet (20 mg total) by mouth daily. 03/27/16  Yes BranchDorn FALCON, MD  glimepiride  (AMARYL ) 2 MG tablet Take 1 tablet (2 mg total) by mouth See admin instructions. 1 mg in the morning, 2 mg at bedtime 10/25/24  Yes Stuart Vernell Norris, PA-C  isosorbide  mononitrate (IMDUR ) 30 MG 24 hr tablet Take 0.5 tablets (15 mg total) by mouth daily. 08/09/24  Yes BranchDorn FALCON, MD  losartan   (COZAAR ) 25 MG tablet Take 0.5 tablets (12.5 mg total) by mouth daily. 08/09/24  Yes BranchDorn FALCON, MD  metoprolol  tartrate (LOPRESSOR ) 25 MG tablet Take 1 tablet (25 mg total) by mouth 2 (two) times daily. 08/09/24  Yes BranchDorn FALCON, MD  Multiple Vitamin (MULTIVITAMIN WITH MINERALS) TABS tablet Take 1 tablet by mouth daily.   Yes [provider]  Omega-3 Fatty Acids (FISH OIL) 1000 MG CAPS Take 1,000 mg by mouth daily.   Yes [provider]  pantoprazole  (PROTONIX ) 40 MG tablet Take 1 tablet (40 mg total) by mouth daily. Take 30-60 min before first meal of the day 02/29/24  Yes Darlean Ozell NOVAK, MD  Potassium Chloride  ER 20 MEQ TBCR Take 1 tablet by mouth daily. 09/19/24  Yes [provider]  predniSONE  (DELTASONE ) 20 MG tablet Take 2 tablets (40 mg total) by mouth daily with breakfast. Patient taking differently: Take 20 mg by mouth daily with breakfast. 12/11/24  Yes Stuart Vernell Norris, PA-C    Allergies: Iodinated contrast media    Review of Systems  Updated Vital Signs BP (!) 161/110   Pulse 72   Temp 98.5 F (36.9 C) (Oral)   Resp 12   Ht 1.6 m (5' 3)   Wt 102.5 kg   SpO2 95%   BMI 40.03 kg/m   Physical Exam Vitals and nursing note reviewed.  Constitutional:  General: She is not in acute distress.    Appearance: She is well-developed. She is obese.  HENT:     Head: Normocephalic and atraumatic.  Eyes:     Conjunctiva/sclera: Conjunctivae normal.  Cardiovascular:     Rate and Rhythm: Normal rate and regular rhythm.  Pulmonary:     Effort: Pulmonary effort is normal. No respiratory distress.     Breath sounds: Normal breath sounds. No stridor.  Abdominal:     General: There is no distension.  Skin:    General: Skin is warm and dry.  Neurological:     Mental Status: She is alert and oriented to person, place, and time.     Cranial Nerves: No cranial nerve deficit.  Psychiatric:        Mood and Affect: Mood normal.      (all labs ordered are listed, but only abnormal results are displayed) Labs Reviewed  BASIC METABOLIC PANEL WITH GFR - Abnormal; Notable for the following components:      Result Value   CO2 16 (*)    Glucose, Bld 533 (*)    BUN 25 (*)    Creatinine, Ser 1.15 (*)    GFR, Estimated 51 (*)    Anion gap 19 (*)    All other components within normal limits  CBG MONITORING, ED - Abnormal; Notable for the following components:   Glucose-Capillary 477 (*)    All other components within normal limits  TROPONIN T, HIGH SENSITIVITY - Abnormal; Notable for the following components:   Troponin T High Sensitivity 21 (*)    All other components within normal limits  CBC  TROPONIN T, HIGH SENSITIVITY    EKG: EKG Interpretation Date/Time:  Wednesday December 13 2024 13:26:58 EST Ventricular Rate:  77 PR Interval:  177 QRS Duration:  99 QT Interval:  419 QTC Calculation: 475 R Axis:   69  Text Interpretation: Sinus rhythm Probable left atrial enlargement Anterior injury pattern Abnormal ECG Confirmed by Garrick Charleston [phone]) on 12/13/2024 2:19:07 PM  Radiology: DG Chest Portable 1 View Result Date: 12/13/2024 CLINICAL DATA:  Chest pain EXAM: PORTABLE CHEST 1 VIEW COMPARISON:  February 06, 2022 FINDINGS: The cardiomediastinal silhouette is unchanged and enlarged in contour.Post median sternotomy and TAVR. No pleural effusion. No pneumothorax. No focal consolidation. Similar appearance of mild reticular prominence and peribronchial cuffing. IMPRESSION: Similar appearance of mild reticular prominence and peribronchial cuffing which may reflect mild pulmonary edema or chronic small airways disease. Electronically Signed   By: Corean Salter M.D.   On: 12/13/2024 14:02     Procedures   Medications Ordered in the ED  aspirin  chewable tablet 324 mg (324 mg Oral Given 12/13/24 1359)  lactated ringers  bolus 1,000 mL (0 mLs Intravenous Stopped 12/13/24 1521)  insulin  aspart (novoLOG )  injection 10 Units (10 Units Intravenous Given 12/13/24 1449)  sodium chloride  0.9 % bolus 1,000 mL (1,000 mLs Intravenous Bolus 12/13/24 1451)    Clinical Course as of 12/13/24 1529  Wed Dec 13, 2024  1523 Handoff RL 68 yo/f here w/ cp Off meds x1 month Asymptomatic currently  Glucose ++ Delta trop Insulin /fluids recheck [SG]    Clinical Course User Index [SG] Elnor Jayson LABOR, DO                                 Medical Decision Making Adult female with multiple medical problems including hypertension, hypercholesterolemia, diabetes and heart failure presents  after episodes of chest pain. Patient is awake, alert, speaking clearly. Patient's presentation complicated by her lack of access to antihyperglycemic's for the past month and on exam, though she is asymptomatic, just hypertensive, hyperglycemic.  Broad differential including ACS, complications of diabetes including DKA. Cardiac 75 sinus normal pulse ox 100% room air normal  Amount and/or Complexity of Data Reviewed Independent Historian: friend External Data Reviewed: notes. Labs: ordered. Decision-making details documented in ED Course. Radiology: ordered and independent interpretation performed. Decision-making details documented in ED Course. ECG/medicine tests: ordered and independent interpretation performed. Decision-making details documented in ED Course.  Risk OTC drugs. Prescription drug management.   3:35 PM Patient receiving fluids, has received insulin .  Initial troponin unremarkable patient awaiting second values, repeat assessment and glucose. We discussed admission versus continued fluid resuscitation, possible insulin , and if she continues to improve discharged to follow-up with primary care.  Patient has strong preference for the this option.  On signout patient awaiting troponin, repeat evaluation.      Final diagnoses:  Atypical chest pain  Hyperglycemia     Garrick Charleston, MD 12/13/24  1536  "

## 2024-12-13 NOTE — ED Provider Notes (Signed)
" °  Provider Note MRN:  [id number]  Arrival date & time: 12/13/2024    ED Course and Medical Decision Making  Assumed care from Dr Garrick at shift change.  See note from prior team for complete details, in brief:  Clinical Course as of 12/13/24 2059  Wed Dec 13, 2024  1523 Handoff RL 68 yo/f here w/ cp Off meds x1 month Asymptomatic currently  Glucose ++ Delta trop Insulin /fluids recheck [SG]  1748 Delta trop 21 > 54, no current cp  BMP repeated, still has AG (19> 17), bicarb (16>17), glucose improved. Will check ua/vbg. >> no ketonuria.   She is on steroids also not taking her DM medication  [SG]  2033 Intermittent sternal cp, burning sensation w/o dib or palpitations, no syncope.  Trop 21 > 54 > 102. EKG with t-wave inversion but no stemi. No current CP [SG]    Clinical Course User Index [SG] Elnor Savant A, DO   Trop up x5 since initial, intermittent CP. EKG w/ twi. Spoke w/ cardiology Dr Otelia. Recommend admit for NSTEMI/heparin /txfr to [facility] Pt has hyperglycemia, acidosis, but no ketones. AG after insulin  and fluids. Will start endo-tool and further fluids.  Plan to admit   .Critical Care  Performed by: Elnor Savant LABOR, DO Authorized by: Elnor Savant LABOR, DO   Critical care provider statement:    Critical care time (minutes):  50   Critical care time was exclusive of:  Separately billable procedures and treating other patients   Critical care was necessary to treat or prevent imminent or life-threatening deterioration of the following conditions:  Cardiac failure and endocrine crisis   Critical care was time spent personally by me on the following activities:  Development of treatment plan with patient or surrogate, discussions with consultants, evaluation of patient's response to treatment, examination of patient, ordering and review of laboratory studies, ordering and review of radiographic studies, ordering and performing treatments and interventions, pulse oximetry,  re-evaluation of patient's condition, review of old charts and obtaining history from patient or surrogate   Care discussed with: admitting provider     Final Clinical Impressions(s) / ED Diagnoses     ICD-10-CM   1. Hyperglycemia  R73.9     2. Elevated troponin  R79.89     3. Chest pain, unspecified type  R07.9     4. NSTEMI (non-ST elevated myocardial infarction) [facility])  I21.4       ED Discharge Orders     None       Discharge Instructions   None        Elnor Savant LABOR, DO 12/13/24 2100  "

## 2024-12-13 NOTE — ED Notes (Signed)
Pt is aware a urine specimen is needed.  

## 2024-12-13 NOTE — ED Notes (Signed)
[facility] CALLED FOR TRANSPORT °

## 2024-12-13 NOTE — ED Triage Notes (Signed)
 Pt bib rcems with complaints of chest pain that lasted a brief second. Chest pain felt like a burning feeling. Cbg in the truck was 576. Patient has not been taking her diabetic medication due to her doctor being out. Pt is also currently on prednisone .

## 2024-12-14 ENCOUNTER — Inpatient Hospital Stay (HOSPITAL_COMMUNITY)

## 2024-12-14 DIAGNOSIS — R079 Chest pain, unspecified: Secondary | ICD-10-CM | POA: Diagnosis not present

## 2024-12-14 DIAGNOSIS — I214 Non-ST elevation (NSTEMI) myocardial infarction: Secondary | ICD-10-CM | POA: Diagnosis not present

## 2024-12-14 DIAGNOSIS — R7989 Other specified abnormal findings of blood chemistry: Secondary | ICD-10-CM | POA: Diagnosis not present

## 2024-12-14 LAB — COMPREHENSIVE METABOLIC PANEL WITH GFR
ALT: 32 U/L (ref 0–44)
AST: 40 U/L (ref 15–41)
Albumin: 3.4 g/dL — ABNORMAL LOW (ref 3.5–5.0)
Alkaline Phosphatase: 72 U/L (ref 38–126)
Anion gap: 12 (ref 5–15)
BUN: 19 mg/dL (ref 8–23)
CO2: 22 mmol/L (ref 22–32)
Calcium: 8.5 mg/dL — ABNORMAL LOW (ref 8.9–10.3)
Chloride: 109 mmol/L (ref 98–111)
Creatinine, Ser: 0.95 mg/dL (ref 0.44–1.00)
GFR, Estimated: >60 mL/min
Glucose, Bld: 156 mg/dL — ABNORMAL HIGH (ref 70–99)
Potassium: 2.8 mmol/L — ABNORMAL LOW (ref 3.5–5.1)
Sodium: 143 mmol/L (ref 135–145)
Total Bilirubin: 0.4 mg/dL (ref 0.0–1.2)
Total Protein: 6 g/dL — ABNORMAL LOW (ref 6.5–8.1)

## 2024-12-14 LAB — CBC
HCT: 34.6 % — ABNORMAL LOW (ref 36.0–46.0)
Hemoglobin: 11.6 g/dL — ABNORMAL LOW (ref 12.0–15.0)
MCH: 27.9 pg (ref 26.0–34.0)
MCHC: 33.5 g/dL (ref 30.0–36.0)
MCV: 83.2 fL (ref 80.0–100.0)
Platelets: 211 K/uL (ref 150–400)
RBC: 4.16 MIL/uL (ref 3.87–5.11)
RDW: 14.5 % (ref 11.5–15.5)
WBC: 10.6 K/uL — ABNORMAL HIGH (ref 4.0–10.5)
nRBC: 0 % (ref 0.0–0.2)

## 2024-12-14 LAB — ECHOCARDIOGRAM COMPLETE
AR max vel: 2.13 cm2
AV Area VTI: 2.12 cm2
AV Area mean vel: 2.02 cm2
AV Mean grad: 7.5 mmHg
AV Peak grad: 12.7 mmHg
Ao pk vel: 1.79 m/s
Area-P 1/2: 2.03 cm2
Height: 63 in
S' Lateral: 3.7 cm
Weight: 3472 [oz_av]

## 2024-12-14 LAB — GLUCOSE, CAPILLARY
Glucose-Capillary: 133 mg/dL — ABNORMAL HIGH (ref 70–99)
Glucose-Capillary: 135 mg/dL — ABNORMAL HIGH (ref 70–99)
Glucose-Capillary: 137 mg/dL — ABNORMAL HIGH (ref 70–99)
Glucose-Capillary: 138 mg/dL — ABNORMAL HIGH (ref 70–99)
Glucose-Capillary: 138 mg/dL — ABNORMAL HIGH (ref 70–99)
Glucose-Capillary: 151 mg/dL — ABNORMAL HIGH (ref 70–99)
Glucose-Capillary: 154 mg/dL — ABNORMAL HIGH (ref 70–99)
Glucose-Capillary: 182 mg/dL — ABNORMAL HIGH (ref 70–99)
Glucose-Capillary: 191 mg/dL — ABNORMAL HIGH (ref 70–99)
Glucose-Capillary: 336 mg/dL — ABNORMAL HIGH (ref 70–99)
Glucose-Capillary: 350 mg/dL — ABNORMAL HIGH (ref 70–99)

## 2024-12-14 LAB — HIV ANTIBODY (ROUTINE TESTING W REFLEX): HIV Screen 4th Generation wRfx: NONREACTIVE

## 2024-12-14 LAB — URINE CULTURE: Culture: >=100000 — AB

## 2024-12-14 LAB — BETA-HYDROXYBUTYRIC ACID: Beta-Hydroxybutyric Acid: 0.12 mmol/L (ref 0.05–0.27)

## 2024-12-14 LAB — HEPARIN LEVEL (UNFRACTIONATED)
Heparin Unfractionated: 0.18 [IU]/mL — ABNORMAL LOW (ref 0.30–0.70)
Heparin Unfractionated: 0.35 [IU]/mL (ref 0.30–0.70)

## 2024-12-14 LAB — MAGNESIUM: Magnesium: 1.5 mg/dL — ABNORMAL LOW (ref 1.7–2.4)

## 2024-12-14 LAB — HEMOGLOBIN A1C
Hgb A1c MFr Bld: 11.1 % — ABNORMAL HIGH (ref 4.8–5.6)
Mean Plasma Glucose: 271.87 mg/dL

## 2024-12-14 LAB — PHOSPHORUS: Phosphorus: 3.4 mg/dL (ref 2.5–4.6)

## 2024-12-14 MED ORDER — SODIUM CHLORIDE 0.9 % IV SOLN
1.0000 g | Freq: Every day | INTRAVENOUS | Status: DC
  Administered 2024-12-14: 1 g via INTRAVENOUS
  Filled 2024-12-14: qty 10

## 2024-12-14 MED ORDER — INSULIN GLARGINE 100 UNIT/ML ~~LOC~~ SOLN
15.0000 [IU] | Freq: Every day | SUBCUTANEOUS | Status: DC
  Administered 2024-12-14 – 2024-12-15 (×2): 15 [IU] via SUBCUTANEOUS
  Filled 2024-12-14 (×3): qty 0.15

## 2024-12-14 MED ORDER — INSULIN GLARGINE 100 UNIT/ML ~~LOC~~ SOLN
15.0000 [IU] | Freq: Every day | SUBCUTANEOUS | Status: DC
  Filled 2024-12-14: qty 0.15

## 2024-12-14 MED ORDER — FUROSEMIDE 20 MG PO TABS
20.0000 mg | ORAL_TABLET | Freq: Every day | ORAL | Status: DC
  Administered 2024-12-14 – 2024-12-16 (×3): 20 mg via ORAL
  Filled 2024-12-14 (×3): qty 1

## 2024-12-14 MED ORDER — MAGNESIUM SULFATE 2 GM/50ML IV SOLN
2.0000 g | Freq: Once | INTRAVENOUS | Status: AC
  Administered 2024-12-14: 2 g via INTRAVENOUS
  Filled 2024-12-14: qty 50

## 2024-12-14 MED ORDER — INSULIN ASPART 100 UNIT/ML IJ SOLN
0.0000 [IU] | Freq: Every day | INTRAMUSCULAR | Status: DC
  Administered 2024-12-14: 4 [IU] via SUBCUTANEOUS
  Administered 2024-12-15: 5 [IU] via SUBCUTANEOUS
  Filled 2024-12-14: qty 5

## 2024-12-14 MED ORDER — INSULIN ASPART 100 UNIT/ML IJ SOLN
0.0000 [IU] | Freq: Three times a day (TID) | INTRAMUSCULAR | Status: DC
  Administered 2024-12-14: 3 [IU] via SUBCUTANEOUS
  Administered 2024-12-14: 11 [IU] via SUBCUTANEOUS
  Administered 2024-12-15: 3 [IU] via SUBCUTANEOUS
  Administered 2024-12-15: 5 [IU] via SUBCUTANEOUS
  Administered 2024-12-15: 15 [IU] via SUBCUTANEOUS
  Administered 2024-12-16: 3 [IU] via SUBCUTANEOUS
  Filled 2024-12-14: qty 5
  Filled 2024-12-14: qty 11
  Filled 2024-12-14: qty 5
  Filled 2024-12-14: qty 3
  Filled 2024-12-14: qty 15
  Filled 2024-12-14: qty 3

## 2024-12-14 MED ORDER — PREDNISONE 20 MG PO TABS
20.0000 mg | ORAL_TABLET | Freq: Every day | ORAL | Status: DC
  Administered 2024-12-14: 20 mg via ORAL
  Filled 2024-12-14: qty 1

## 2024-12-14 MED ORDER — CEPHALEXIN 500 MG PO CAPS
500.0000 mg | ORAL_CAPSULE | Freq: Three times a day (TID) | ORAL | Status: DC
  Administered 2024-12-14 – 2024-12-16 (×6): 500 mg via ORAL
  Filled 2024-12-14 (×8): qty 1

## 2024-12-14 MED ORDER — HEPARIN BOLUS VIA INFUSION
2000.0000 [IU] | Freq: Once | INTRAVENOUS | Status: AC
  Administered 2024-12-14: 2000 [IU] via INTRAVENOUS
  Filled 2024-12-14: qty 2000

## 2024-12-14 MED ORDER — INSULIN ASPART 100 UNIT/ML IJ SOLN
INTRAMUSCULAR | Status: AC
  Filled 2024-12-14: qty 11

## 2024-12-14 MED ORDER — FAMOTIDINE 20 MG PO TABS
20.0000 mg | ORAL_TABLET | Freq: Every day | ORAL | Status: DC
  Administered 2024-12-14 – 2024-12-15 (×2): 20 mg via ORAL
  Filled 2024-12-14 (×2): qty 1

## 2024-12-14 MED ORDER — LOSARTAN POTASSIUM 25 MG PO TABS
25.0000 mg | ORAL_TABLET | Freq: Every day | ORAL | Status: DC
  Administered 2024-12-14 – 2024-12-16 (×3): 25 mg via ORAL
  Filled 2024-12-14 (×3): qty 1

## 2024-12-14 MED ORDER — POTASSIUM CHLORIDE CRYS ER 20 MEQ PO TBCR
40.0000 meq | EXTENDED_RELEASE_TABLET | Freq: Once | ORAL | Status: AC
  Administered 2024-12-14: 40 meq via ORAL
  Filled 2024-12-14: qty 2

## 2024-12-14 MED ORDER — POTASSIUM CHLORIDE 10 MEQ/100ML IV SOLN
10.0000 meq | INTRAVENOUS | Status: AC
  Administered 2024-12-14 (×3): 10 meq via INTRAVENOUS
  Filled 2024-12-14 (×3): qty 100

## 2024-12-14 MED ORDER — METOPROLOL TARTRATE 25 MG PO TABS
25.0000 mg | ORAL_TABLET | Freq: Two times a day (BID) | ORAL | Status: DC
  Administered 2024-12-14 – 2024-12-16 (×6): 25 mg via ORAL
  Filled 2024-12-14 (×6): qty 1

## 2024-12-14 MED ORDER — INSULIN GLARGINE 100 UNITS/ML SOLOSTAR PEN
15.0000 [IU] | PEN_INJECTOR | Freq: Every day | SUBCUTANEOUS | Status: DC
  Filled 2024-12-14: qty 3

## 2024-12-14 MED ORDER — ISOSORBIDE MONONITRATE ER 30 MG PO TB24
30.0000 mg | ORAL_TABLET | Freq: Every day | ORAL | Status: DC
  Administered 2024-12-14 – 2024-12-16 (×3): 30 mg via ORAL
  Filled 2024-12-14 (×3): qty 1

## 2024-12-14 MED ORDER — PANTOPRAZOLE SODIUM 40 MG PO TBEC
40.0000 mg | DELAYED_RELEASE_TABLET | Freq: Every day | ORAL | Status: DC
  Administered 2024-12-14 – 2024-12-16 (×3): 40 mg via ORAL
  Filled 2024-12-14 (×3): qty 1

## 2024-12-14 NOTE — Progress Notes (Signed)
 "  Rounding Note   Patient Name: Karina Green Date of Encounter: 12/14/2024  [facility] Cardiologist: Alvan Carrier, MD   Subjective  No chest pain this morning  Scheduled Meds:  aspirin  EC  81 mg Oral Daily   atorvastatin   80 mg Oral Daily   famotidine   20 mg Oral QPC supper   furosemide   20 mg Oral Daily   isosorbide  mononitrate  30 mg Oral Daily   losartan   25 mg Oral Daily   metoprolol  tartrate  25 mg Oral BID   pantoprazole   40 mg Oral Daily   predniSONE   20 mg Oral Q breakfast   Continuous Infusions:  cefTRIAXone  (ROCEPHIN )  IV Stopped (12/14/24 0357)   dextrose  5% lactated ringers  Stopped (12/14/24 0357)   heparin  1,150 Units/hr (12/14/24 0659)   insulin  3.6 Units/hr (12/14/24 0612)   lactated ringers  Stopped (12/13/24 2225)   PRN Meds: acetaminophen  **OR** acetaminophen , dextrose , ondansetron  **OR** ondansetron  (ZOFRAN ) IV   Vital Signs  Vitals:   12/13/24 2300 12/14/24 0000 12/14/24 0421 12/14/24 0500  BP: (!) 142/56 (!) 175/68 (!) 114/54   Pulse: (!) 54 68 (!) 55   Resp: 16 17 18    Temp:  98 F (36.7 C) 98.3 F (36.8 C)   TempSrc:  Oral Oral   SpO2: 97% 97% 94%   Weight:    98.4 kg  Height:        Intake/Output Summary (Last 24 hours) at 12/14/2024 0711 Last data filed at 12/14/2024 0422 Gross per 24 hour  Intake 1329.73 ml  Output --  Net 1329.73 ml      12/14/2024    5:00 AM 12/13/2024    1:20 PM 03/21/2024   11:27 AM  Last 3 Weights  Weight (lbs) 217 lb 226 lb 225 lb 12.8 oz  Weight (kg) 98.431 kg 102.513 kg 102.422 kg      Telemetry Sinus - Personally Reviewed  ECG  No am tracing   Physical Exam  GEN: No acute distress.   Neck: No JVD Cardiac: RRR, no murmurs, rubs, or gallops.  Respiratory: Clear to auscultation bilaterally. GI: Soft, nontender, non-distended  MS: No edema; No deformity. Neuro:  Nonfocal  Psych: Normal affect   Labs High Sensitivity Troponin:  No results for input(s): TROPONINIHS in the  last 720 hours.  Recent Labs  Lab 12/13/24 1359 12/13/24 1641 12/13/24 1859  TRNPT 21* 54* 102*       Chemistry Recent Labs  Lab 12/13/24 1359 12/13/24 1641 12/14/24 0512  NA 135 136 143  K 3.8 3.8 2.8*  CL 100 103 109  CO2 16* 17* 22  GLUCOSE 533* 380* 156*  BUN 25* 23 19  CREATININE 1.15* 1.03* 0.95  CALCIUM  9.1 8.8* 8.5*  MG  --   --  1.5*  PROT  --   --  6.0*  ALBUMIN   --   --  3.4*  AST  --   --  40  ALT  --   --  32  ALKPHOS  --   --  72  BILITOT  --   --  0.4  GFRNONAA 51* 58* >60  ANIONGAP 19* 17* 12    Lipids No results for input(s): CHOL, TRIG, HDL, LABVLDL, LDLCALC, CHOLHDL in the last 168 hours.  Hematology Recent Labs  Lab 12/13/24 1359 12/14/24 0512  WBC 9.3 10.6*  RBC 5.08 4.16  HGB 14.0 11.6*  HCT 43.1 34.6*  MCV 84.8 83.2  MCH 27.6 27.9  MCHC 32.5  33.5  RDW 14.5 14.5  PLT 251 211   Thyroid  No results for input(s): TSH, FREET4 in the last 168 hours.  BNPNo results for input(s): BNP, PROBNP in the last 168 hours.  DDimer No results for input(s): DDIMER in the last 168 hours.   Radiology  DG Chest Portable 1 View Result Date: 12/13/2024 CLINICAL DATA:  Chest pain EXAM: PORTABLE CHEST 1 VIEW COMPARISON:  February 06, 2022 FINDINGS: The cardiomediastinal silhouette is unchanged and enlarged in contour.Post median sternotomy and TAVR. No pleural effusion. No pneumothorax. No focal consolidation. Similar appearance of mild reticular prominence and peribronchial cuffing. IMPRESSION: Similar appearance of mild reticular prominence and peribronchial cuffing which may reflect mild pulmonary edema or chronic small airways disease. Electronically Signed   By: Corean Salter M.D.   On: 12/13/2024 14:02    Patient Profile    68 y.o. female with history of CAD s/p 2V CABG, severe AS s/p TAVR, HLD, HTN, DM admitted with chest pain and found to have DKA and mildly elevated troponin levels.   Assessment & Plan   CAD/NSTEMI: She  has elevated troponin levels in the setting of DKA. She has had some chest pain on/off at home over the past few weeks. This is described as a pressure across her shoulders with moderate exertion.  Agree with continuing IV heparin  for now while her DKA is being managed.  Continue ASA, statin, Imdur  and Lopressor  Echo today.  Consider cardiac cath tomorrow. Our team will assess her tomorrow and add to the cath board if her labs are stable.    For questions or updates, please contact [facility] Please consult [email or web address] for contact info under     Signed, Lonni Cash, MD  12/14/2024, 7:11 AM    "

## 2024-12-14 NOTE — Progress Notes (Signed)
 Echocardiogram 2D Echocardiogram has been performed.  Karina Green 12/14/2024, 11:08 AM

## 2024-12-14 NOTE — Progress Notes (Signed)
 Pt has arrived to floor from [facility] via [facility]. Pt A&Ox4, vitals stable, arrived on a heparin  gtt and insulin  gtt verified with [facility] RN. Tele applied , room admission completed . Admissions notified of the arrival of pt. Pt has no concerns at this time , call bell within reach.

## 2024-12-14 NOTE — Plan of Care (Signed)

## 2024-12-14 NOTE — Progress Notes (Addendum)
 "    Progress Note    Karina Green  FMW:[id number] DOB: 04-30-1956  DOA: 12/13/2024 PCP: Pcp, No      Brief Narrative:    Medical records reviewed and are as summarized below:  Karina Green is a 68 y.o. female  with medical history significant of hypertension, hyperlipidemia, chronic diastolic heart failure, T2DM, CAD s/p CABG, severe aortic stenosis s/p TAVR (01/2022), OA, recent diagnosis of UTI after visit to the urgent care clinic on 12/11/2024.  She was also started on prednisone  a day prior to admission for acute gouty arthritis on the left hand.  She is a known diabetic but had right ankle pain and swelling about a month prior to admission.  [facility] to [facility] with chest pain that has been going on on and off intermittently for about 2 weeks.  It typically would okay with exertion.  However, on the day of admission, pain was persistent and occurred even at rest.  It was more severe and lasted about 30 minutes.   Troponin was elevated: 21, 54, and oriented Glucose elevated at 533 with bicarb of 16 and anion gap of 19.   Chest x-ray IMPRESSION: Similar appearance of mild reticular prominence and peribronchial cuffing which may reflect mild pulmonary edema or chronic small airways disease.   EKG Normal sinus rhythm, T wave inversion is seen 1, aVL, V1 and V2.     Assessment/Plan:   Principal Problem:   NSTEMI (non-ST elevated myocardial infarction) [facility]) Active Problems:   Chest pain   Elevated troponin     Body mass index is 38.44 kg/m.  (Class II obesity)   Acute NSTEMI: Continue IV heparin  drip.  Monitor heparin  level per protocol. Continue aspirin  and Lipitor .  Plan for left heart cath tomorrow. 2D echo is pending. Follow-up with cardiologist   Type II DM with DKA: Discontinue IV insulin  infusion. Start Lantus  15 units daily.  Use NovoLog  as needed for hyperglycemia. Hemoglobin A1c has been ordered.   Acute gouty arthritis of left hand:  She said she started taking prednisone  a day prior to admission.  She said she does not want to continue with prednisone . She declines colchicine  or any additional treatment because she feels better.   Recently diagnosed E. coli UTI from urine specimen on 12/11/2024: Culture positive for pansensitive E. coli.  Change IV ceftriaxone  to Keflex .   Hypokalemia: Replete potassium intravenously and orally Hypomagnesemia: Replete with IV magnesium  sulfate.   Chronic diastolic CHF: Compensated. 2D echo in February 2024 showed EF estimated 65%, grade 2 diastolic dysfunction.   Comorbidities include hypertension, hyperlipidemia, GERD    Diet Order             Diet heart healthy/carb modified Room service appropriate? Yes; Fluid consistency: Thin  Diet effective now                                  Consultants: Cardiologist   Procedures: None    Medications:    aspirin  EC  81 mg Oral Daily   atorvastatin   80 mg Oral Daily   famotidine   20 mg Oral QPC supper   furosemide   20 mg Oral Daily   insulin  aspart  0-15 Units Subcutaneous TID WC   insulin  aspart  0-5 Units Subcutaneous QHS   insulin  glargine  15 Units Subcutaneous Daily   isosorbide  mononitrate  30 mg Oral Daily   losartan   25  mg Oral Daily   metoprolol  tartrate  25 mg Oral BID   pantoprazole   40 mg Oral Daily   Continuous Infusions:  cefTRIAXone  (ROCEPHIN )  IV Stopped (12/14/24 0426)   dextrose  5% lactated ringers  Stopped (12/14/24 0956)   heparin  1,150 Units/hr (12/14/24 1234)   lactated ringers  Stopped (12/13/24 2225)     Anti-infectives (From admission, onward)    Start     Dose/Rate Route Frequency Ordered Stop   12/14/24 0230  cefTRIAXone  (ROCEPHIN ) 1 g in sodium chloride  0.9 % 100 mL IVPB        1 g 200 mL/hr over 30 Minutes Intravenous Daily at bedtime 12/14/24 0213                Family Communication/Anticipated D/C date and plan/Code Status   DVT prophylaxis: SCDs  Start: 12/13/24 2356     Code Status: Full Code  Family Communication: None Disposition Plan: Plan to discharge home   Status is: Inpatient Remains inpatient appropriate because: DKA, acute NSTEMI       Subjective:   Interval events noted.  She has no complaints.  She feels better.  No chest pain or shortness of breath.  Pain in the left hand has improved.  Victoria, RN, at the bedside  Objective:    Vitals:   12/14/24 0500 12/14/24 0845 12/14/24 1005 12/14/24 1202  BP:  (!) 126/59  (!) 155/58  Pulse:  (!) 50 69 64  Resp:  18  20  Temp:  (!) 97.1 F (36.2 C)  98 F (36.7 C)  TempSrc:  Oral  Oral  SpO2:  96%  95%  Weight: 98.4 kg     Height:       No data found.   Intake/Output Summary (Last 24 hours) at 12/14/2024 1435 Last data filed at 12/14/2024 1234 Gross per 24 hour  Intake 2371.31 ml  Output 350 ml  Net 2021.31 ml   Filed Weights   12/13/24 1320 12/14/24 0500  Weight: 102.5 kg 98.4 kg    Exam:  GEN: NAD SKIN: Warm and dry EYES: No pallor or icterus ENT: MMM CV: RRR PULM: CTA B ABD: soft, obese, NT, +BS CNS: AAO x 3, non focal EXT: Mild erythema and mild tenderness around the dorsal aspect of MCP joints of 2nd and 3rd fingers (patient said this has improved significantly after starting prednisone )        Data Reviewed:   I have personally reviewed following labs and imaging studies:  Labs: Labs show the following:   Basic Metabolic Panel: Recent Labs  Lab 12/13/24 1359 12/13/24 1641 12/14/24 0512  NA 135 136 143  K 3.8 3.8 2.8*  CL 100 103 109  CO2 16* 17* 22  GLUCOSE 533* 380* 156*  BUN 25* 23 19  CREATININE 1.15* 1.03* 0.95  CALCIUM  9.1 8.8* 8.5*  MG  --   --  1.5*  PHOS  --   --  3.4   GFR Estimated Creatinine Clearance: 61.6 mL/min (by C-G formula based on SCr of 0.95 mg/dL). Liver Function Tests: Recent Labs  Lab 12/14/24 0512  AST 40  ALT 32  ALKPHOS 72  BILITOT 0.4  PROT 6.0*  ALBUMIN  3.4*   No  results for input(s): LIPASE, AMYLASE in the last 168 hours. No results for input(s): AMMONIA in the last 168 hours. Coagulation profile Recent Labs  Lab 12/13/24 2158  INR 1.2    CBC: Recent Labs  Lab 12/13/24 1359 12/14/24 0512  WBC 9.3 10.6*  HGB 14.0 11.6*  HCT 43.1 34.6*  MCV 84.8 83.2  PLT 251 211   Cardiac Enzymes: No results for input(s): CKTOTAL, CKMB, CKMBINDEX, TROPONINI in the last 168 hours. BNP (last 3 results) No results for input(s): PROBNP in the last 8760 hours. CBG: Recent Labs  Lab 12/14/24 0512 12/14/24 0610 12/14/24 0703 12/14/24 0816 12/14/24 1137  GLUCAP 154* 151* 138* 133* 182*   D-Dimer: No results for input(s): DDIMER in the last 72 hours. Hgb A1c: Recent Labs    12/14/24 0512  HGBA1C 11.1*   Lipid Profile: No results for input(s): CHOL, HDL, LDLCALC, TRIG, CHOLHDL, LDLDIRECT in the last 72 hours. Thyroid  function studies: No results for input(s): TSH, T4TOTAL, T3FREE, THYROIDAB in the last 72 hours.  Invalid input(s): FREET3 Anemia work up: No results for input(s): VITAMINB12, FOLATE, FERRITIN, TIBC, IRON, RETICCTPCT in the last 72 hours. Sepsis Labs: Recent Labs  Lab 12/13/24 1359 12/14/24 0512  WBC 9.3 10.6*    Microbiology Recent Results (from the past 240 hours)  Urine Culture     Status: Abnormal   Collection Time: 12/11/24  5:16 PM   Specimen: Urine, Clean Catch  Result Value Ref Range Status   Specimen Description   Final    URINE, CLEAN CATCH Performed at [facility], [address] [place], [place] [address]    Special Requests   Final    NONE Performed at [facility], [address], [place], [place] [address]    Culture >=100,000 COLONIES/mL ESCHERICHIA COLI (A)  Final   Report Status 12/14/2024 FINAL  Final   Organism ID, Bacteria ESCHERICHIA COLI (A)  Final      Susceptibility   Escherichia coli - MIC*    AMPICILLIN <=2 SENSITIVE Sensitive      CEFAZOLIN  (URINE) Value in next row Sensitive      <=1 SENSITIVEThis is a modified FDA-approved test that has been validated and its performance characteristics determined by the reporting laboratory.  This laboratory is certified under the Clinical Laboratory Improvement Amendments CLIA as qualified to perform high complexity clinical laboratory testing.    CEFEPIME Value in next row Sensitive      <=1 SENSITIVEThis is a modified FDA-approved test that has been validated and its performance characteristics determined by the reporting laboratory.  This laboratory is certified under the Clinical Laboratory Improvement Amendments CLIA as qualified to perform high complexity clinical laboratory testing.    ERTAPENEM Value in next row Sensitive      <=1 SENSITIVEThis is a modified FDA-approved test that has been validated and its performance characteristics determined by the reporting laboratory.  This laboratory is certified under the Clinical Laboratory Improvement Amendments CLIA as qualified to perform high complexity clinical laboratory testing.    CEFTRIAXONE  Value in next row Sensitive      <=1 SENSITIVEThis is a modified FDA-approved test that has been validated and its performance characteristics determined by the reporting laboratory.  This laboratory is certified under the Clinical Laboratory Improvement Amendments CLIA as qualified to perform high complexity clinical laboratory testing.    CIPROFLOXACIN  Value in next row Sensitive      <=1 SENSITIVEThis is a modified FDA-approved test that has been validated and its performance characteristics determined by the reporting laboratory.  This laboratory is certified under the Clinical Laboratory Improvement Amendments CLIA as qualified to perform high complexity clinical laboratory testing.    GENTAMICIN Value in next row Sensitive      <=1 SENSITIVEThis is a modified FDA-approved test that has  been validated and its performance characteristics  determined by the reporting laboratory.  This laboratory is certified under the Clinical Laboratory Improvement Amendments CLIA as qualified to perform high complexity clinical laboratory testing.    NITROFURANTOIN  Value in next row Sensitive      <=1 SENSITIVEThis is a modified FDA-approved test that has been validated and its performance characteristics determined by the reporting laboratory.  This laboratory is certified under the Clinical Laboratory Improvement Amendments CLIA as qualified to perform high complexity clinical laboratory testing.    TRIMETH /SULFA  Value in next row Sensitive      <=1 SENSITIVEThis is a modified FDA-approved test that has been validated and its performance characteristics determined by the reporting laboratory.  This laboratory is certified under the Clinical Laboratory Improvement Amendments CLIA as qualified to perform high complexity clinical laboratory testing.    AMPICILLIN/SULBACTAM Value in next row Sensitive      <=1 SENSITIVEThis is a modified FDA-approved test that has been validated and its performance characteristics determined by the reporting laboratory.  This laboratory is certified under the Clinical Laboratory Improvement Amendments CLIA as qualified to perform high complexity clinical laboratory testing.    PIP/TAZO Value in next row Sensitive      <=4 SENSITIVEThis is a modified FDA-approved test that has been validated and its performance characteristics determined by the reporting laboratory.  This laboratory is certified under the Clinical Laboratory Improvement Amendments CLIA as qualified to perform high complexity clinical laboratory testing.    MEROPENEM Value in next row Sensitive      <=4 SENSITIVEThis is a modified FDA-approved test that has been validated and its performance characteristics determined by the reporting laboratory.  This laboratory is certified under the Clinical Laboratory Improvement Amendments CLIA as qualified to perform  high complexity clinical laboratory testing.    * >=100,000 COLONIES/mL ESCHERICHIA COLI    Procedures and diagnostic studies:  ECHOCARDIOGRAM COMPLETE Result Date: 12/14/2024    ECHOCARDIOGRAM REPORT   Patient Name:   KAMARIA LUCIA Heavner Date of Exam: 12/14/2024 Medical Rec #:  [id number]     Height:       63.0 in Accession #:    [id number]    Weight:       217.0 lb Date of Birth:  [birth year]     BSA:          2.002 m Patient Age:    68 years      BP:           124/58 mmHg Patient Gender: F             HR:           73 bpm. Exam Location:  Inpatient Procedure: 2D Echo, Cardiac Doppler and Color Doppler (Both Spectral and Color            Flow Doppler were utilized during procedure). Indications:    Chest Pain R07.9  History:        Patient has prior history of Echocardiogram examinations, most                 recent 02/18/2023. Previous Myocardial Infarction; Risk                 Factors:Diabetes and Hypertension.                 Aortic Valve: 29 mm Medtronic stented (TAVR) valve is present in                 the  aortic position.  Sonographer:    Merlynn Argyle Referring Phys: [id number] GILLIAN HERO [facility]  Sonographer Comments: Image acquisition challenging due to respiratory motion and Image acquisition challenging due to patient body habitus. IMPRESSIONS  1. Left ventricular ejection fraction, by estimation, is 60 to 65%. The left ventricle has normal function. The left ventricle has no regional wall motion abnormalities. Left ventricular diastolic parameters are consistent with Grade II diastolic dysfunction (pseudonormalization).  2. Right ventricular systolic function is normal. The right ventricular size is normal. There is mildly elevated pulmonary artery systolic pressure.  3. Left atrial size was mildly dilated.  4. The mitral valve is degenerative. No evidence of mitral valve regurgitation. No evidence of mitral stenosis. Moderate mitral annular calcification.  5. The aortic valve has been repaired/replaced. Aortic  valve regurgitation is not visualized. No aortic stenosis is present. There is a 29 mm Medtronic stented (TAVR) valve present in the aortic position. Echo findings are consistent with normal structure and function of the aortic valve prosthesis. Aortic valve area, by VTI measures 2.12 cm. Aortic valve mean gradient measures 7.5 mmHg. Aortic valve Vmax measures 1.78 m/s.  6. The inferior vena cava is normal in size with greater than 50% respiratory variability, suggesting right atrial pressure of 3 mmHg. FINDINGS  Left Ventricle: Left ventricular ejection fraction, by estimation, is 60 to 65%. The left ventricle has normal function. The left ventricle has no regional wall motion abnormalities. The left ventricular internal cavity size was normal in size. There is  no left ventricular hypertrophy. Left ventricular diastolic parameters are consistent with Grade II diastolic dysfunction (pseudonormalization). Right Ventricle: The right ventricular size is normal. No increase in right ventricular wall thickness. Right ventricular systolic function is normal. There is mildly elevated pulmonary artery systolic pressure. The tricuspid regurgitant velocity is 2.92  m/s, and with an assumed right atrial pressure of 8 mmHg, the estimated right ventricular systolic pressure is 42.1 mmHg. Left Atrium: Left atrial size was mildly dilated. Right Atrium: Right atrial size was normal in size. Pericardium: There is no evidence of pericardial effusion. Mitral Valve: The mitral valve is degenerative in appearance. Moderate mitral annular calcification. No evidence of mitral valve regurgitation. No evidence of mitral valve stenosis. Tricuspid Valve: The tricuspid valve is normal in structure. Tricuspid valve regurgitation is not demonstrated. No evidence of tricuspid stenosis. Aortic Valve: The aortic valve has been repaired/replaced. Aortic valve regurgitation is not visualized. No aortic stenosis is present. Aortic valve mean  gradient measures 7.5 mmHg. Aortic valve peak gradient measures 12.7 mmHg. Aortic valve area, by VTI  measures 2.12 cm. There is a 29 mm Medtronic stented (TAVR) valve present in the aortic position. Echo findings are consistent with normal structure and function of the aortic valve prosthesis. Pulmonic Valve: The pulmonic valve was normal in structure. Pulmonic valve regurgitation is not visualized. No evidence of pulmonic stenosis. Aorta: The aortic root is normal in size and structure. Venous: The inferior vena cava is normal in size with greater than 50% respiratory variability, suggesting right atrial pressure of 3 mmHg. IAS/Shunts: No atrial level shunt detected by color flow Doppler.  LEFT VENTRICLE PLAX 2D LVIDd:         5.00 cm   Diastology LVIDs:         3.70 cm   LV e' medial:    6.57 cm/s LV PW:         1.00 cm   LV E/e' medial:  25.9 LV IVS:  0.90 cm   LV e' lateral:   6.16 cm/s LVOT diam:     2.00 cm   LV E/e' lateral: 27.6 LV SV:         89 LV SV Index:   45 LVOT Area:     3.14 cm  RIGHT VENTRICLE            IVC RV Basal diam:  2.80 cm    IVC diam: 2.50 cm RV S prime:     8.68 cm/s TAPSE (M-mode): 1.2 cm     PULMONARY VEINS                            Diastolic Velocity: 82.60 cm/s                            S/D Velocity:       0.80                            Systolic Velocity:  65.70 cm/s LEFT ATRIUM             Index        RIGHT ATRIUM           Index LA diam:        4.90 cm 2.45 cm/m   RA Area:     16.80 cm LA Vol (A2C):   45.0 ml 22.48 ml/m  RA Volume:   39.50 ml  19.73 ml/m LA Vol (A4C):   53.9 ml 26.92 ml/m LA Biplane Vol: 52.4 ml 26.18 ml/m  AORTIC VALVE AV Area (Vmax):    2.13 cm AV Area (Vmean):   2.02 cm AV Area (VTI):     2.12 cm AV Vmax:           178.50 cm/s AV Vmean:          130.000 cm/s AV VTI:            0.420 m AV Peak Grad:      12.7 mmHg AV Mean Grad:      7.5 mmHg LVOT Vmax:         121.00 cm/s LVOT Vmean:        83.600 cm/s LVOT VTI:          0.284 m LVOT/AV VTI  ratio: 0.68  AORTA Ao Root diam: 2.80 cm Ao Asc diam:  3.20 cm MITRAL VALVE                TRICUSPID VALVE MV Area (PHT): 2.03 cm     TR Peak grad:   34.1 mmHg MV Decel Time: 373 msec     TR Vmax:        292.00 cm/s MV E velocity: 170.00 cm/s MV A velocity: 112.00 cm/s  SHUNTS MV E/A ratio:  1.52         Systemic VTI:  0.28 m                             Systemic Diam: 2.00 cm Toribio Fuel MD Electronically signed by Toribio Fuel MD Signature Date/Time: 12/14/2024/11:17:18 AM    Final    DG Chest Portable 1 View Result Date: 12/13/2024 CLINICAL DATA:  Chest pain EXAM: PORTABLE CHEST 1 VIEW COMPARISON:  February 06, 2022 FINDINGS: The cardiomediastinal silhouette is unchanged and enlarged  in contour.Post median sternotomy and TAVR. No pleural effusion. No pneumothorax. No focal consolidation. Similar appearance of mild reticular prominence and peribronchial cuffing. IMPRESSION: Similar appearance of mild reticular prominence and peribronchial cuffing which may reflect mild pulmonary edema or chronic small airways disease. Electronically Signed   By: Corean Salter M.D.   On: 12/13/2024 14:02               LOS: 1 day   Bel[place] Valli  [facility]   Pager on [email or web address]. If 7PM-7AM, please contact night-coverage at [email or web address]     12/14/2024, 2:35 PM           "

## 2024-12-14 NOTE — Plan of Care (Signed)

## 2024-12-14 NOTE — Consult Note (Signed)
 PHARMACY - ANTICOAGULATION  Pharmacy Consult for Heparin  Indication: chest pain/ACS Brief A/P: Heparin  level subtherapeutic Increase Heparin  rate  Allergies[1]  Patient Measurements: Height: 5' 3 (160 cm) Weight: 98.4 kg (217 lb) IBW/kg (Calculated) : 52.4 HEPARIN  DW (KG): 76.6  Vital Signs: Temp: 98 F (36.7 C) (12/25 1202) Temp Source: Oral (12/25 1202) BP: 155/58 (12/25 1202) Pulse Rate: 64 (12/25 1202)  Labs: Recent Labs    12/13/24 1359 12/13/24 1641 12/13/24 2158 12/14/24 0512 12/14/24 1349  HGB 14.0  --   --  11.6*  --   HCT 43.1  --   --  34.6*  --   PLT 251  --   --  211  --   APTT  --   --  135*  --   --   LABPROT  --   --  15.8*  --   --   INR  --   --  1.2  --   --   HEPARINUNFRC  --   --   --  0.18* 0.35  CREATININE 1.15* 1.03*  --  0.95  --     Estimated Creatinine Clearance: 61.6 mL/min (by C-G formula based on SCr of 0.95 mg/dL).  Assessment: 68 y.o. female with CAD/NSTEMI and pharmacy consulted for dosing IV heparin . Cardiology consulted; possible cath tomorrow 12/26. No on anticoagulation PTA. Hgb 14 > 11.6; Plt wnl.   Heparin  level therapeutic at 0.35 on infusion of 1150 units/h. No issues with heparin  infusion or bleeding reported.   Goal of Therapy:  Heparin  level 0.3-0.7 units/ml Monitor platelets by anticoagulation protocol: Yes   Plan:  Continue heparin  at 1150 units/h Next (confirmatory) heparin  level with AM labs CBC, heparin  level daily while on heparin   Maurilio Patten, PharmD PGY1 Pharmacy Resident [facility] 12/14/2024 2:27 PM     [1]  Allergies Allergen Reactions   Iodinated Contrast Media Rash    Very large itchy rash within 24 hours of heart cath

## 2024-12-14 NOTE — Consult Note (Signed)
 PHARMACY - ANTICOAGULATION  Pharmacy Consult for Heparin  Indication: chest pain/ACS Brief A/P: Heparin  level subtherapeutic Increase Heparin  rate  Allergies[1]  Patient Measurements: Height: 5' 3 (160 cm) Weight: 102.5 kg (226 lb) IBW/kg (Calculated) : 52.4 HEPARIN  DW (KG): 76.6  Vital Signs: Temp: 98.3 F (36.8 C) (12/25 0421) Temp Source: Oral (12/25 0421) BP: 114/54 (12/25 0421) Pulse Rate: 55 (12/25 0421)  Labs: Recent Labs    12/13/24 1359 12/13/24 1641 12/13/24 2158 12/14/24 0512  HGB 14.0  --   --  11.6*  HCT 43.1  --   --  34.6*  PLT 251  --   --  211  APTT  --   --  135*  --   LABPROT  --   --  15.8*  --   INR  --   --  1.2  --   HEPARINUNFRC  --   --   --  0.18*  CREATININE 1.15* 1.03*  --   --     Estimated Creatinine Clearance: 58.1 mL/min (A) (by C-G formula based on SCr of 1.03 mg/dL (H)).  Assessment: 68 y.o. female with chest pain for heparin    Goal of Therapy:  Heparin  level 0.3-0.7 units/ml Monitor platelets by anticoagulation protocol: Yes   Plan:  Heparin  2000 units IV bolus, then increase heparin   1150 units/hr Check heparin  level in 8 hours.   Dail Cordella Misty 12/14/2024,6:25 AM       [1]  Allergies Allergen Reactions   Iodinated Contrast Media Rash    Very large itchy rash within 24 hours of heart cath

## 2024-12-15 ENCOUNTER — Telehealth (HOSPITAL_COMMUNITY): Payer: Self-pay

## 2024-12-15 ENCOUNTER — Encounter (HOSPITAL_COMMUNITY)
Admission: EM | Disposition: A | Discharge status: Home / Self Care | Payer: Self-pay | Source: Home / Self Care | Attending: Internal Medicine

## 2024-12-15 ENCOUNTER — Other Ambulatory Visit (HOSPITAL_COMMUNITY): Payer: Self-pay

## 2024-12-15 ENCOUNTER — Ambulatory Visit (HOSPITAL_COMMUNITY): Payer: Self-pay

## 2024-12-15 ENCOUNTER — Ambulatory Visit: Admitting: Cardiology

## 2024-12-15 DIAGNOSIS — I4891 Unspecified atrial fibrillation: Secondary | ICD-10-CM

## 2024-12-15 DIAGNOSIS — I2510 Atherosclerotic heart disease of native coronary artery without angina pectoris: Secondary | ICD-10-CM

## 2024-12-15 DIAGNOSIS — I214 Non-ST elevation (NSTEMI) myocardial infarction: Secondary | ICD-10-CM | POA: Diagnosis not present

## 2024-12-15 HISTORY — PX: LEFT HEART CATH AND CORONARY ANGIOGRAPHY: CATH118249

## 2024-12-15 LAB — RENAL FUNCTION PANEL
Albumin: 3.7 g/dL (ref 3.5–5.0)
Anion gap: 11 (ref 5–15)
BUN: 17 mg/dL (ref 8–23)
CO2: 21 mmol/L — ABNORMAL LOW (ref 22–32)
Calcium: 8.6 mg/dL — ABNORMAL LOW (ref 8.9–10.3)
Chloride: 109 mmol/L (ref 98–111)
Creatinine, Ser: 1.09 mg/dL — ABNORMAL HIGH (ref 0.44–1.00)
GFR, Estimated: 54 mL/min — ABNORMAL LOW
Glucose, Bld: 203 mg/dL — ABNORMAL HIGH (ref 70–99)
Phosphorus: 3.1 mg/dL (ref 2.5–4.6)
Potassium: 3.5 mmol/L (ref 3.5–5.1)
Sodium: 141 mmol/L (ref 135–145)

## 2024-12-15 LAB — CBC
HCT: 37.5 % (ref 36.0–46.0)
Hemoglobin: 12.4 g/dL (ref 12.0–15.0)
MCH: 28.1 pg (ref 26.0–34.0)
MCHC: 33.1 g/dL (ref 30.0–36.0)
MCV: 84.8 fL (ref 80.0–100.0)
Platelets: 183 K/uL (ref 150–400)
RBC: 4.42 MIL/uL (ref 3.87–5.11)
RDW: 14.7 % (ref 11.5–15.5)
WBC: 8.6 K/uL (ref 4.0–10.5)
nRBC: 0 % (ref 0.0–0.2)

## 2024-12-15 LAB — HEPARIN LEVEL (UNFRACTIONATED): Heparin Unfractionated: 0.37 [IU]/mL (ref 0.30–0.70)

## 2024-12-15 LAB — GLUCOSE, CAPILLARY
Glucose-Capillary: 203 mg/dL — ABNORMAL HIGH (ref 70–99)
Glucose-Capillary: 175 mg/dL — ABNORMAL HIGH (ref 70–99)
Glucose-Capillary: 382 mg/dL — ABNORMAL HIGH (ref 70–99)
Glucose-Capillary: 378 mg/dL — ABNORMAL HIGH (ref 70–99)

## 2024-12-15 LAB — MAGNESIUM: Magnesium: 1.8 mg/dL (ref 1.7–2.4)

## 2024-12-15 MED ORDER — HYDRALAZINE HCL 20 MG/ML IJ SOLN: 10.0000 mg | INTRAMUSCULAR | Status: AC | PRN

## 2024-12-15 MED ORDER — POTASSIUM CHLORIDE CRYS ER 20 MEQ PO TBCR
40.0000 meq | EXTENDED_RELEASE_TABLET | Freq: Once | ORAL | Status: AC
  Administered 2024-12-15: 40 meq via ORAL
  Filled 2024-12-15: qty 2

## 2024-12-15 MED ORDER — SODIUM CHLORIDE 0.9 % IV SOLN: 250.0000 mL | INTRAVENOUS | Status: DC | PRN

## 2024-12-15 MED ORDER — DIPHENHYDRAMINE HCL 50 MG/ML IJ SOLN
INTRAMUSCULAR | Status: AC
  Administered 2024-12-15: 25 mg via INTRAVENOUS
  Filled 2024-12-15: qty 1

## 2024-12-15 MED ORDER — SODIUM CHLORIDE 0.9% FLUSH
3.0000 mL | INTRAVENOUS | Status: DC | PRN
  Administered 2024-12-15: 3 mL via INTRAVENOUS

## 2024-12-15 MED ORDER — MIDAZOLAM HCL (PF) 2 MG/2ML IJ SOLN
INTRAMUSCULAR | Status: DC | PRN
  Administered 2024-12-15: 2 mg via INTRAVENOUS

## 2024-12-15 MED ORDER — LIDOCAINE HCL (PF) 1 % IJ SOLN
INTRAMUSCULAR | Status: AC
  Filled 2024-12-15: qty 30

## 2024-12-15 MED ORDER — LIDOCAINE HCL (PF) 1 % IJ SOLN
INTRAMUSCULAR | Status: DC | PRN
  Administered 2024-12-15: 15 mL

## 2024-12-15 MED ORDER — LABETALOL HCL 5 MG/ML IV SOLN: 10.0000 mg | INTRAVENOUS | Status: AC | PRN

## 2024-12-15 MED ORDER — HYDRALAZINE HCL 20 MG/ML IJ SOLN
INTRAMUSCULAR | Status: DC | PRN
  Administered 2024-12-15: 10 mg via INTRAVENOUS

## 2024-12-15 MED ORDER — DIPHENHYDRAMINE HCL 50 MG/ML IJ SOLN: 25.0000 mg | Freq: Once | INTRAMUSCULAR | Status: AC

## 2024-12-15 MED ORDER — HEPARIN (PORCINE) IN NACL 1000-0.9 UT/500ML-% IV SOLN
INTRAVENOUS | Status: DC | PRN
  Administered 2024-12-15 (×2): 500 mL

## 2024-12-15 MED ORDER — METHYLPREDNISOLONE SODIUM SUCC 125 MG IJ SOLR: 125.0000 mg | Freq: Once | INTRAMUSCULAR | Status: AC

## 2024-12-15 MED ORDER — IOHEXOL 350 MG/ML SOLN
INTRAVENOUS | Status: DC | PRN
  Administered 2024-12-15: 70 mL

## 2024-12-15 MED ORDER — FREE WATER
500.0000 mL | Freq: Once | Status: AC
  Administered 2024-12-15: 500 mL via ORAL

## 2024-12-15 MED ORDER — MIDAZOLAM HCL 2 MG/2ML IJ SOLN
INTRAMUSCULAR | Status: AC
  Filled 2024-12-15: qty 2

## 2024-12-15 MED ORDER — FENTANYL CITRATE (PF) 100 MCG/2ML IJ SOLN
INTRAMUSCULAR | Status: DC | PRN
  Administered 2024-12-15: 50 ug via INTRAVENOUS

## 2024-12-15 MED ORDER — LIVING WELL WITH DIABETES BOOK
Freq: Once | Status: AC
  Filled 2024-12-15: qty 1

## 2024-12-15 MED ORDER — METHYLPREDNISOLONE SODIUM SUCC 125 MG IJ SOLR
INTRAMUSCULAR | Status: AC
  Administered 2024-12-15: 125 mg via INTRAVENOUS
  Filled 2024-12-15: qty 2

## 2024-12-15 MED ORDER — INSULIN STARTER KIT- PEN NEEDLES (ENGLISH)
1.0000 | Freq: Once | Status: AC
  Administered 2024-12-15: 1
  Filled 2024-12-15: qty 1

## 2024-12-15 MED ORDER — FENTANYL CITRATE (PF) 100 MCG/2ML IJ SOLN
INTRAMUSCULAR | Status: AC
  Filled 2024-12-15: qty 2

## 2024-12-15 MED ORDER — SODIUM CHLORIDE 0.9% FLUSH
3.0000 mL | Freq: Two times a day (BID) | INTRAVENOUS | Status: DC
  Administered 2024-12-15 (×2): 3 mL via INTRAVENOUS

## 2024-12-15 MED ORDER — HYDRALAZINE HCL 20 MG/ML IJ SOLN
INTRAMUSCULAR | Status: AC
  Filled 2024-12-15: qty 1

## 2024-12-15 NOTE — Telephone Encounter (Signed)
 Pharmacy Patient Advocate Encounter  Insurance verification completed.    The patient is insured through [employer]. Patient has Medicare and is not eligible for a copay card, but may be able to apply for patient assistance or Medicare RX Payment Plan (Patient Must reach out to their plan, if eligible for payment plan), if available.    Ran test claim for Lantus  100unit Pen and the current 30 day co-pay is $35.  Ran test claim for Basaglar  100unit Pen and the current 30 day co-pay is $35.  Ran test claim for Freestyle Libre 3 Plus sensor and the current 30 day co-pay is $0.  This test claim was processed through [employer]- copay amounts may vary at other pharmacies due to [employer], or as the patient moves through the different stages of their insurance plan.

## 2024-12-15 NOTE — Hospital Course (Signed)
 SABRA

## 2024-12-15 NOTE — Interval H&P Note (Signed)
 History and Physical Interval Note:  12/15/2024 11:53 AM  Karina Green  has presented today for surgery, with the diagnosis of nstemi.  The various methods of treatment have been discussed with the patient and family. After consideration of risks, benefits and other options for treatment, the patient has consented to  Procedures: LEFT HEART CATH AND CORONARY ANGIOGRAPHY (N/A) as a surgical intervention.  The patient's history has been reviewed, patient examined, no change in status, stable for surgery.  I have reviewed the patient's chart and labs.  Questions were answered to the patient's satisfaction.    Cath Lab Visit (complete for each Cath Lab visit)  Clinical Evaluation Leading to the Procedure:   ACS: Yes.    Non-ACS:    Anginal Classification: CCS III  Anti-ischemic medical therapy: Maximal Therapy (2 or more classes of medications)  Non-Invasive Test Results: No non-invasive testing performed  Prior CABG: Previous CABG        Karina Green

## 2024-12-15 NOTE — Plan of Care (Signed)

## 2024-12-15 NOTE — Inpatient Diabetes Management (Addendum)
 Inpatient Diabetes Program Recommendations  AACE/ADA: New Consensus Statement on Inpatient Glycemic Control (2015)  Target Ranges:  Prepandial:   less than 140 mg/dL      Peak postprandial:   less than 180 mg/dL (1-2 hours)      Critically ill patients:  140 - 180 mg/dL   Lab Results  Component Value Date   GLUCAP 203 (H) 12/15/2024   HGBA1C 11.1 (H) 12/14/2024    Latest Reference Range & Units 12/14/24 07:03 12/14/24 08:16 12/14/24 11:37 12/14/24 16:42 12/14/24 20:12 12/15/24 07:23  Glucose-Capillary 70 - 99 mg/dL 861 (H) 866 (H) 817 (H) 350 (H) 336 (H) 203 (H)  (H): Data is abnormally high  Latest Reference Range & Units 12/13/24 13:59  Sodium 135 - 145 mmol/L 135  Potassium 3.5 - 5.1 mmol/L 3.8  Chloride 98 - 111 mmol/L 100  CO2 22 - 32 mmol/L 16 (L)  Glucose 70 - 99 mg/dL 466 (HH)  BUN 8 - 23 mg/dL 25 (H)  Creatinine 9.55 - 1.00 mg/dL 8.84 (H)  Calcium  8.9 - 10.3 mg/dL 9.1  Anion gap 5 - 15  19 (H)  (HH): Data is critically high (L): Data is abnormally low (H): Data is abnormally high  Diabetes history: DM Outpatient Diabetes medications: Amaryl  2 mg bid Current orders for Inpatient glycemic control: Lantus  15 units daily Novolog  0-15 units tid, 0-5 units hs  Inpatient Diabetes Program Recommendations:    Noted current A1c 11.1 (average blood glucose 272 over the past 2-3 months). Ordered Living Well With Diabetes and insulin  pen starter kit to prepare if patient is discharged home on insulin .  Attempted to speak with patient but was already in cath lab.  Thank you, Ivan Lacher E. Kennia Vanvorst, RN, MSN, CNS, CDCES  Diabetes Coordinator Inpatient Glycemic Control Team Team Pager [phone] (8am-5pm) 12/15/2024 11:06 AM

## 2024-12-15 NOTE — Progress Notes (Deleted)
 "    Progress Note    ZAVIA PULLEN  FMW:[id number] DOB: 10-30-1956  DOA: 12/13/2024 PCP: Pcp, No      Brief Narrative:    Medical records reviewed and are as summarized below:  EMALEIGH GUIMOND is a 68 y.o. female  with medical history significant of hypertension, hyperlipidemia, chronic diastolic heart failure, T2DM, CAD s/p CABG, severe aortic stenosis s/p TAVR (01/2022), OA, recent diagnosis of UTI after visit to the urgent care clinic on 12/11/2024.  She was also started on prednisone  a day prior to admission for acute gouty arthritis on the left hand.  She is a known diabetic but had run out of Glimepiride  about a month prior to admission.  She presented to the hospital with chest pain that has been going on on and off intermittently for about 2 weeks.  It typically would okay with exertion.  However, on the day of admission, pain was persistent and occurred even at rest.  It was more severe and lasted about 30 minutes.   Troponin was elevated: 21, 54, and oriented Glucose elevated at 533 with bicarb of 16 and anion gap of 19.   Chest x-ray IMPRESSION: Similar appearance of mild reticular prominence and peribronchial cuffing which may reflect mild pulmonary edema or chronic small airways disease.   EKG Normal sinus rhythm, T wave inversion is seen 1, aVL, V1 and V2.     Assessment/Plan:   Principal Problem:   NSTEMI (non-ST elevated myocardial infarction) [facility]) Active Problems:   Chest pain   Elevated troponin     Body mass index is 39.87 kg/m.  (Class II obesity)   Acute NSTEMI: Continue IV heparin  drip.  Monitor heparin  level per protocol. Continue aspirin  and Lipitor .  Plan for left heart cath tomorrow. 2D echo is pending. Follow-up with cardiologist   Type II DM with DKA: Continue IV insulin  infusion. Start Lantus  15 units daily.  Use NovoLog  as needed for hyperglycemia. Hemoglobin A1c has been ordered.   Acute gouty arthritis of left hand: She said  she started taking prednisone  a day prior to admission.  She said she does not want to continue with prednisone . She declines colchicine  or any additional treatment because she feels better.   Recently diagnosed E. coli UTI from urine specimen on 12/11/2024: Culture positive for pansensitive E. coli.  Change IV ceftriaxone  to Keflex .   Hypokalemia: Replete potassium intravenously and orally Hypomagnesemia: Replete with IV magnesium  sulfate.   Chronic diastolic CHF: Compensated. 2D echo in February 2024 showed EF estimated 65%, grade 2 diastolic dysfunction.   Comorbidities include hypertension, hyperlipidemia, GERD    Diet Order             Diet NPO time specified  Diet effective now                                  Consultants: Cardiologist   Procedures: None    Medications:    aspirin  EC  81 mg Oral Daily   atorvastatin   80 mg Oral Daily   cephALEXin   500 mg Oral Q8H   famotidine   20 mg Oral QPC supper   furosemide   20 mg Oral Daily   insulin  aspart       insulin  aspart  0-15 Units Subcutaneous TID WC   insulin  aspart  0-5 Units Subcutaneous QHS   insulin  glargine  15 Units Subcutaneous Daily   isosorbide  mononitrate  30  mg Oral Daily   losartan   25 mg Oral Daily   metoprolol  tartrate  25 mg Oral BID   pantoprazole   40 mg Oral Daily   potassium chloride   40 mEq Oral Once   Continuous Infusions:  heparin  1,150 Units/hr (12/14/24 1705)     Anti-infectives (From admission, onward)    Start     Dose/Rate Route Frequency Ordered Stop   12/14/24 1545  cephALEXin  (KEFLEX ) capsule 500 mg        500 mg Oral Every 8 hours 12/14/24 1457 12/17/24 1359   12/14/24 0230  cefTRIAXone  (ROCEPHIN ) 1 g in sodium chloride  0.9 % 100 mL IVPB  Status:  Discontinued        1 g 200 mL/hr over 30 Minutes Intravenous Daily at bedtime 12/14/24 0213 12/14/24 1457              Family Communication/Anticipated D/C date and plan/Code Status   DVT  prophylaxis: SCDs Start: 12/13/24 2356     Code Status: Full Code  Family Communication: None Disposition Plan: Plan to discharge home   Status is: Inpatient Remains inpatient appropriate because: DKA, acute NSTEMI       Subjective:   Vital signs noted.  She has no complaints.  She feels better.  No chest pain or shortness of breath.  Pain in the left hand has improved.  Victoria, RN, at the bedside  Objective:    Vitals:   12/14/24 2315 12/14/24 2352 12/15/24 0445 12/15/24 0723  BP: (!) 150/76 (!) 150/54 (!) 168/72 (!) 156/67  Pulse: 93 70 65 65  Resp:  20 18 16   Temp:  97.9 F (36.6 C) 97.9 F (36.6 C) 97.7 F (36.5 C)  TempSrc:  Oral Oral Oral  SpO2:  95% 95% 97%  Weight:   102.1 kg   Height:       No data found.   Intake/Output Summary (Last 24 hours) at 12/15/2024 0951 Last data filed at 12/14/2024 2357 Gross per 24 hour  Intake 1041.58 ml  Output 1150 ml  Net -108.42 ml   Filed Weights   12/13/24 1320 12/14/24 0500 12/15/24 0445  Weight: 102.5 kg 98.4 kg 102.1 kg    Exam:  GEN: NAD SKIN: Warm and dry EYES: No pallor or icterus ENT: MMM CV: RRR PULM: CTA B ABD: soft, obese, NT, +BS CNS: AAO x 3, non focal EXT: Mild erythema and mild tenderness around the dorsal aspect of MCP joints of 2nd and 3rd fingers (patient said this has improved significantly after starting prednisone )        Data Reviewed:   I have personally reviewed following labs and imaging studies:  Labs: Labs show the following:   Basic Metabolic Panel: Recent Labs  Lab 12/13/24 1359 12/13/24 1641 12/14/24 0512 12/15/24 0556  NA 135 136 143 141  K 3.8 3.8 2.8* 3.5  CL 100 103 109 109  CO2 16* 17* 22 21*  GLUCOSE 533* 380* 156* 203*  BUN 25* 23 19 17   CREATININE 1.15* 1.03* 0.95 1.09*  CALCIUM  9.1 8.8* 8.5* 8.6*  MG  --   --  1.5* 1.8  PHOS  --   --  3.4 3.1   GFR Estimated Creatinine Clearance: 54.8 mL/min (A) (by C-G formula based on SCr of 1.09 mg/dL  (H)). Liver Function Tests: Recent Labs  Lab 12/14/24 0512 12/15/24 0556  AST 40  --   ALT 32  --   ALKPHOS 72  --   BILITOT 0.4  --  PROT 6.0*  --   ALBUMIN  3.4* 3.7   No results for input(s): LIPASE, AMYLASE in the last 168 hours. No results for input(s): AMMONIA in the last 168 hours. Coagulation profile Recent Labs  Lab 12/13/24 2158  INR 1.2    CBC: Recent Labs  Lab 12/13/24 1359 12/14/24 0512 12/15/24 0556  WBC 9.3 10.6* 8.6  HGB 14.0 11.6* 12.4  HCT 43.1 34.6* 37.5  MCV 84.8 83.2 84.8  PLT 251 211 183   Cardiac Enzymes: No results for input(s): CKTOTAL, CKMB, CKMBINDEX, TROPONINI in the last 168 hours. BNP (last 3 results) No results for input(s): PROBNP in the last 8760 hours. CBG: Recent Labs  Lab 12/14/24 0816 12/14/24 1137 12/14/24 1642 12/14/24 2012 12/15/24 0723  GLUCAP 133* 182* 350* 336* 203*   D-Dimer: No results for input(s): DDIMER in the last 72 hours. Hgb A1c: Recent Labs    12/14/24 0512  HGBA1C 11.1*   Lipid Profile: No results for input(s): CHOL, HDL, LDLCALC, TRIG, CHOLHDL, LDLDIRECT in the last 72 hours. Thyroid  function studies: No results for input(s): TSH, T4TOTAL, T3FREE, THYROIDAB in the last 72 hours.  Invalid input(s): FREET3 Anemia work up: No results for input(s): VITAMINB12, FOLATE, FERRITIN, TIBC, IRON, RETICCTPCT in the last 72 hours. Sepsis Labs: Recent Labs  Lab 12/13/24 1359 12/14/24 0512 12/15/24 0556  WBC 9.3 10.6* 8.6    Microbiology Recent Results (from the past 240 hours)  Urine Culture     Status: Abnormal   Collection Time: 12/11/24  5:16 PM   Specimen: Urine, Clean Catch  Result Value Ref Range Status   Specimen Description   Final    URINE, CLEAN CATCH Performed at [facility], [address] [place], [place] [address]    Special Requests   Final    NONE Performed at [facility], [address], [place], [place] [address]     Culture >=100,000 COLONIES/mL ESCHERICHIA COLI (A)  Final   Report Status 12/14/2024 FINAL  Final   Organism ID, Bacteria ESCHERICHIA COLI (A)  Final      Susceptibility   Escherichia coli - MIC*    AMPICILLIN <=2 SENSITIVE Sensitive     CEFAZOLIN  (URINE) Value in next row Sensitive      <=1 SENSITIVEThis is a modified FDA-approved test that has been validated and its performance characteristics determined by the reporting laboratory.  This laboratory is certified under the Clinical Laboratory Improvement Amendments CLIA as qualified to perform high complexity clinical laboratory testing.    CEFEPIME Value in next row Sensitive      <=1 SENSITIVEThis is a modified FDA-approved test that has been validated and its performance characteristics determined by the reporting laboratory.  This laboratory is certified under the Clinical Laboratory Improvement Amendments CLIA as qualified to perform high complexity clinical laboratory testing.    ERTAPENEM Value in next row Sensitive      <=1 SENSITIVEThis is a modified FDA-approved test that has been validated and its performance characteristics determined by the reporting laboratory.  This laboratory is certified under the Clinical Laboratory Improvement Amendments CLIA as qualified to perform high complexity clinical laboratory testing.    CEFTRIAXONE  Value in next row Sensitive      <=1 SENSITIVEThis is a modified FDA-approved test that has been validated and its performance characteristics determined by the reporting laboratory.  This laboratory is certified under the Clinical Laboratory Improvement Amendments CLIA as qualified to perform high complexity clinical laboratory testing.    CIPROFLOXACIN  Value in next row  Sensitive      <=1 SENSITIVEThis is a modified FDA-approved test that has been validated and its performance characteristics determined by the reporting laboratory.  This laboratory is certified under the Clinical Laboratory Improvement  Amendments CLIA as qualified to perform high complexity clinical laboratory testing.    GENTAMICIN Value in next row Sensitive      <=1 SENSITIVEThis is a modified FDA-approved test that has been validated and its performance characteristics determined by the reporting laboratory.  This laboratory is certified under the Clinical Laboratory Improvement Amendments CLIA as qualified to perform high complexity clinical laboratory testing.    NITROFURANTOIN  Value in next row Sensitive      <=1 SENSITIVEThis is a modified FDA-approved test that has been validated and its performance characteristics determined by the reporting laboratory.  This laboratory is certified under the Clinical Laboratory Improvement Amendments CLIA as qualified to perform high complexity clinical laboratory testing.    TRIMETH /SULFA  Value in next row Sensitive      <=1 SENSITIVEThis is a modified FDA-approved test that has been validated and its performance characteristics determined by the reporting laboratory.  This laboratory is certified under the Clinical Laboratory Improvement Amendments CLIA as qualified to perform high complexity clinical laboratory testing.    AMPICILLIN/SULBACTAM Value in next row Sensitive      <=1 SENSITIVEThis is a modified FDA-approved test that has been validated and its performance characteristics determined by the reporting laboratory.  This laboratory is certified under the Clinical Laboratory Improvement Amendments CLIA as qualified to perform high complexity clinical laboratory testing.    PIP/TAZO Value in next row Sensitive      <=4 SENSITIVEThis is a modified FDA-approved test that has been validated and its performance characteristics determined by the reporting laboratory.  This laboratory is certified under the Clinical Laboratory Improvement Amendments CLIA as qualified to perform high complexity clinical laboratory testing.    MEROPENEM Value in next row Sensitive      <=4 SENSITIVEThis is  a modified FDA-approved test that has been validated and its performance characteristics determined by the reporting laboratory.  This laboratory is certified under the Clinical Laboratory Improvement Amendments CLIA as qualified to perform high complexity clinical laboratory testing.    * >=100,000 COLONIES/mL ESCHERICHIA COLI    Procedures and diagnostic studies:  ECHOCARDIOGRAM COMPLETE Result Date: 12/14/2024    ECHOCARDIOGRAM REPORT   Patient Name:   DEISHA STULL Kolodziejski Date of Exam: 12/14/2024 Medical Rec #:  [id number]     Height:       63.0 in Accession #:    [id number]    Weight:       217.0 lb Date of Birth:  [birth year]     BSA:          2.002 m Patient Age:    68 years      BP:           124/58 mmHg Patient Gender: F             HR:           73 bpm. Exam Location:  Inpatient Procedure: 2D Echo, Cardiac Doppler and Color Doppler (Both Spectral and Color            Flow Doppler were utilized during procedure). Indications:    Chest Pain R07.9  History:        Patient has prior history of Echocardiogram examinations, most  recent 02/18/2023. Previous Myocardial Infarction; Risk                 Factors:Diabetes and Hypertension.                 Aortic Valve: 29 mm Medtronic stented (TAVR) valve is present in                 the aortic position.  Sonographer:    Merlynn Argyle Referring Phys: [id number] GILLIAN HERO [facility]  Sonographer Comments: Image acquisition challenging due to respiratory motion and Image acquisition challenging due to patient body habitus. IMPRESSIONS  1. Left ventricular ejection fraction, by estimation, is 60 to 65%. The left ventricle has normal function. The left ventricle has no regional wall motion abnormalities. Left ventricular diastolic parameters are consistent with Grade II diastolic dysfunction (pseudonormalization).  2. Right ventricular systolic function is normal. The right ventricular size is normal. There is mildly elevated pulmonary artery systolic pressure.  3. Left  atrial size was mildly dilated.  4. The mitral valve is degenerative. No evidence of mitral valve regurgitation. No evidence of mitral stenosis. Moderate mitral annular calcification.  5. The aortic valve has been repaired/replaced. Aortic valve regurgitation is not visualized. No aortic stenosis is present. There is a 29 mm Medtronic stented (TAVR) valve present in the aortic position. Echo findings are consistent with normal structure and function of the aortic valve prosthesis. Aortic valve area, by VTI measures 2.12 cm. Aortic valve mean gradient measures 7.5 mmHg. Aortic valve Vmax measures 1.78 m/s.  6. The inferior vena cava is normal in size with greater than 50% respiratory variability, suggesting right atrial pressure of 3 mmHg. FINDINGS  Left Ventricle: Left ventricular ejection fraction, by estimation, is 60 to 65%. The left ventricle has normal function. The left ventricle has no regional wall motion abnormalities. The left ventricular internal cavity size was normal in size. There is  no left ventricular hypertrophy. Left ventricular diastolic parameters are consistent with Grade II diastolic dysfunction (pseudonormalization). Right Ventricle: The right ventricular size is normal. No increase in right ventricular wall thickness. Right ventricular systolic function is normal. There is mildly elevated pulmonary artery systolic pressure. The tricuspid regurgitant velocity is 2.92  m/s, and with an assumed right atrial pressure of 8 mmHg, the estimated right ventricular systolic pressure is 42.1 mmHg. Left Atrium: Left atrial size was mildly dilated. Right Atrium: Right atrial size was normal in size. Pericardium: There is no evidence of pericardial effusion. Mitral Valve: The mitral valve is degenerative in appearance. Moderate mitral annular calcification. No evidence of mitral valve regurgitation. No evidence of mitral valve stenosis. Tricuspid Valve: The tricuspid valve is normal in structure.  Tricuspid valve regurgitation is not demonstrated. No evidence of tricuspid stenosis. Aortic Valve: The aortic valve has been repaired/replaced. Aortic valve regurgitation is not visualized. No aortic stenosis is present. Aortic valve mean gradient measures 7.5 mmHg. Aortic valve peak gradient measures 12.7 mmHg. Aortic valve area, by VTI  measures 2.12 cm. There is a 29 mm Medtronic stented (TAVR) valve present in the aortic position. Echo findings are consistent with normal structure and function of the aortic valve prosthesis. Pulmonic Valve: The pulmonic valve was normal in structure. Pulmonic valve regurgitation is not visualized. No evidence of pulmonic stenosis. Aorta: The aortic root is normal in size and structure. Venous: The inferior vena cava is normal in size with greater than 50% respiratory variability, suggesting right atrial pressure of 3 mmHg. IAS/Shunts: No atrial level shunt detected  by color flow Doppler.  LEFT VENTRICLE PLAX 2D LVIDd:         5.00 cm   Diastology LVIDs:         3.70 cm   LV e' medial:    6.57 cm/s LV PW:         1.00 cm   LV E/e' medial:  25.9 LV IVS:        0.90 cm   LV e' lateral:   6.16 cm/s LVOT diam:     2.00 cm   LV E/e' lateral: 27.6 LV SV:         89 LV SV Index:   45 LVOT Area:     3.14 cm  RIGHT VENTRICLE            IVC RV Basal diam:  2.80 cm    IVC diam: 2.50 cm RV S prime:     8.68 cm/s TAPSE (M-mode): 1.2 cm     PULMONARY VEINS                            Diastolic Velocity: 82.60 cm/s                            S/D Velocity:       0.80                            Systolic Velocity:  65.70 cm/s LEFT ATRIUM             Index        RIGHT ATRIUM           Index LA diam:        4.90 cm 2.45 cm/m   RA Area:     16.80 cm LA Vol (A2C):   45.0 ml 22.48 ml/m  RA Volume:   39.50 ml  19.73 ml/m LA Vol (A4C):   53.9 ml 26.92 ml/m LA Biplane Vol: 52.4 ml 26.18 ml/m  AORTIC VALVE AV Area (Vmax):    2.13 cm AV Area (Vmean):   2.02 cm AV Area (VTI):     2.12 cm AV Vmax:            178.50 cm/s AV Vmean:          130.000 cm/s AV VTI:            0.420 m AV Peak Grad:      12.7 mmHg AV Mean Grad:      7.5 mmHg LVOT Vmax:         121.00 cm/s LVOT Vmean:        83.600 cm/s LVOT VTI:          0.284 m LVOT/AV VTI ratio: 0.68  AORTA Ao Root diam: 2.80 cm Ao Asc diam:  3.20 cm MITRAL VALVE                TRICUSPID VALVE MV Area (PHT): 2.03 cm     TR Peak grad:   34.1 mmHg MV Decel Time: 373 msec     TR Vmax:        292.00 cm/s MV E velocity: 170.00 cm/s MV A velocity: 112.00 cm/s  SHUNTS MV E/A ratio:  1.52         Systemic VTI:  0.28 m  Systemic Diam: 2.00 cm Toribio Fuel MD Electronically signed by Toribio Fuel MD Signature Date/Time: 12/14/2024/11:17:18 AM    Final    DG Chest Portable 1 View Result Date: 12/13/2024 CLINICAL DATA:  Chest pain EXAM: PORTABLE CHEST 1 VIEW COMPARISON:  February 06, 2022 FINDINGS: The cardiomediastinal silhouette is unchanged and enlarged in contour.Post median sternotomy and TAVR. No pleural effusion. No pneumothorax. No focal consolidation. Similar appearance of mild reticular prominence and peribronchial cuffing. IMPRESSION: Similar appearance of mild reticular prominence and peribronchial cuffing which may reflect mild pulmonary edema or chronic small airways disease. Electronically Signed   By: Corean Salter M.D.   On: 12/13/2024 14:02               LOS: 2 days   Analaura Messler  [facility]   Pager on [email or web address]. If 7PM-7AM, please contact night-coverage at [email or web address]     12/15/2024, 9:51 AM           "

## 2024-12-15 NOTE — Inpatient Diabetes Management (Signed)
 Inpatient Diabetes Program Recommendations  AACE/ADA: New Consensus Statement on Inpatient Glycemic Control (2015)  Target Ranges:  Prepandial:   less than 140 mg/dL      Peak postprandial:   less than 180 mg/dL (1-2 hours)      Critically ill patients:  140 - 180 mg/dL   Lab Results  Component Value Date   GLUCAP 175 (H) 12/15/2024   HGBA1C 11.1 (H) 12/14/2024    Review of Glycemic Control  Diabetes history: DM2 Outpatient Diabetes medications: Amaryl  1 mg bid Current orders for Inpatient glycemic control: Lantus  15 units daily Novolog  0-15 units tid, 0-5 units hs  Inpatient Diabetes Program Recommendations:   Educated patient on insulin  pen use at home. Reviewed contents of insulin  flexpen starter kit. Reviewed all steps of insulin  pen including attachment of needle, 2-unit air shot, dialing up dose, giving injection, removing needle, disposal of sharps, storage of unused insulin , disposal of insulin  etc. Patient able to provide successful return demonstration. Also reviewed troubleshooting with insulin  pen. MD to give patient Rxs for insulin  pens and insulin  pen needles.   Reviewed A1c of 11.1 (average blood glucose 272 over the past 2-3 months). Reviewed plate method. Patient plans to adjust her nutrition and try to start walking some. Patient has full custody of her 2 young great grandchildren which the youngest is 3.  Thank you, Karina Bogosian E. Lacheryl Niesen, RN, MSN, CNS, CDCES  Diabetes Coordinator Inpatient Glycemic Control Team Team Pager [phone] (8am-5pm) 12/15/2024 2:53 PM

## 2024-12-15 NOTE — Progress Notes (Signed)
 IMPORTANT NOTE:  Telemetry on this patient had been switched at some point with another patient so the atrial fibrillation that was seen was not this patient's telemetry.  Safety zone has been made.    This patient does not have atrial fibrillation at this current time.

## 2024-12-15 NOTE — Progress Notes (Signed)
 "    Progress Note    Karina Green  FMW:[id number] DOB: 1956/07/22  DOA: 12/13/2024 PCP: Pcp, No      Brief Narrative:    Medical records reviewed and are as summarized below:  Karina Green is a 68 y.o. female  with medical history significant of hypertension, hyperlipidemia, chronic diastolic heart failure, T2DM, CAD s/p CABG, severe aortic stenosis s/p TAVR (01/2022), OA, recent diagnosis of UTI after visit to the urgent care clinic on 12/11/2024.  She was also started on prednisone  a day prior to admission for acute gouty arthritis on the left hand.  She is a known diabetic but had run out of Glimepiride  about a month prior to admission.  She presented to the hospital with chest pain that has been going on on and off intermittently for about 2 weeks.  It typically would okay with exertion.  However, on the day of admission, pain was persistent and occurred even at rest.  It was more severe and lasted about 30 minutes.   Troponin was elevated: 21, 54, and oriented Glucose elevated at 533 with bicarb of 16 and anion gap of 19.   Chest x-ray IMPRESSION: Similar appearance of mild reticular prominence and peribronchial cuffing which may reflect mild pulmonary edema or chronic small airways disease.   EKG Normal sinus rhythm, T wave inversion is seen 1, aVL, V1 and V2.     Assessment/Plan:   Principal Problem:   NSTEMI (non-ST elevated myocardial infarction) [facility]) Active Problems:   Chest pain   Elevated troponin     Body mass index is 39.87 kg/m.  (Class II obesity)   Acute NSTEMI: She is on IV heparin  drip.  Monitor heparin  level per protocol.  Plan for left heart cath today.  Case discussed with cardiologist, Dr. Court, at the bedside. 2D echo showed EF estimated at 60 to 65%, grade 2 diastolic dysfunction, mildly elevated pulmonary artery systolic pressure, moderately dilated left atrium.  Normal structure and function of aortic valve prosthesis.   Type II DM  with DKA: Patient is n.p.o. for left heart cath so Lantus  will be held this morning.  Lantus  will be resumed after heart cath.  NovoLog  as needed for hyperglycemia. Hemoglobin A1c was 11.1. Management of diabetes with severe hyperglycemia discussed.  Recommended insulin  therapy.  Patient said she was previously on glimepiride  but had ran out of glimepiride  about a month prior to admission.  She is agreeable to starting insulin .  Patient educated on insulin  management and potential complications including hypoglycemia.  Discussed hypoglycemia awareness and treatment of hypoglycemia with the patient.  Consulted diabetic coordinator for additional medication.   Acute gouty arthritis of left hand: Improved.  She said she started taking prednisone  a day prior to admission.  She said she does not want to continue with prednisone .   Recently diagnosed E. coli UTI from urine specimen on 12/11/2024: Culture positive for pansensitive E. coli.  Continue Keflex .   Hypokalemia: Improved.  Continue potassium repletion. Hypomagnesemia: Improved.   Chronic diastolic CHF: Compensated. 2D echo in February 2024 showed EF estimated 65%, grade 2 diastolic dysfunction.   Comorbidities include hypertension, hyperlipidemia, GERD   Of note, initially Karina Green, [facility], reported that patient had gone into A-fib on 12/14/2024.  However it was discovered that this was an error because of switching cardiac monitors, and patient did not go into A-fib.    Diet Order             Diet NPO time  specified  Diet effective now                                  Consultants: Cardiologist   Procedures: None    Medications:    aspirin  EC  81 mg Oral Daily   atorvastatin   80 mg Oral Daily   cephALEXin   500 mg Oral Q8H   famotidine   20 mg Oral QPC supper   furosemide   20 mg Oral Daily   insulin  aspart       insulin  aspart  0-15 Units Subcutaneous TID WC   insulin  aspart  0-5 Units Subcutaneous  QHS   insulin  glargine  15 Units Subcutaneous Daily   isosorbide  mononitrate  30 mg Oral Daily   losartan   25 mg Oral Daily   metoprolol  tartrate  25 mg Oral BID   pantoprazole   40 mg Oral Daily   Continuous Infusions:  heparin  1,150 Units/hr (12/14/24 1705)     Anti-infectives (From admission, onward)    Start     Dose/Rate Route Frequency Ordered Stop   12/14/24 1545  cephALEXin  (KEFLEX ) capsule 500 mg        500 mg Oral Every 8 hours 12/14/24 1457 12/17/24 1359   12/14/24 0230  cefTRIAXone  (ROCEPHIN ) 1 g in sodium chloride  0.9 % 100 mL IVPB  Status:  Discontinued        1 g 200 mL/hr over 30 Minutes Intravenous Daily at bedtime 12/14/24 0213 12/14/24 1457              Family Communication/Anticipated D/C date and plan/Code Status   DVT prophylaxis: SCDs Start: 12/13/24 2356     Code Status: Full Code  Family Communication: None Disposition Plan: Plan to discharge home   Status is: Inpatient Remains inpatient appropriate because: DKA, acute NSTEMI       Subjective:   Interval events noted.  No chest pain or shortness of breath.  Pain in the left hand is better.  Victoria, RN, at the bedside.  Dr. Court, cardiologist, walked into the room during this encounter.  Objective:    Vitals:   12/14/24 2315 12/14/24 2352 12/15/24 0445 12/15/24 0723  BP: (!) 150/76 (!) 150/54 (!) 168/72 (!) 156/67  Pulse: 93 70 65 65  Resp:  20 18 16   Temp:  97.9 F (36.6 C) 97.9 F (36.6 C) 97.7 F (36.5 C)  TempSrc:  Oral Oral Oral  SpO2:  95% 95% 97%  Weight:   102.1 kg   Height:       No data found.   Intake/Output Summary (Last 24 hours) at 12/15/2024 0957 Last data filed at 12/14/2024 2357 Gross per 24 hour  Intake 1041.58 ml  Output 1150 ml  Net -108.42 ml   Filed Weights   12/13/24 1320 12/14/24 0500 12/15/24 0445  Weight: 102.5 kg 98.4 kg 102.1 kg    Exam:  GEN: NAD SKIN: Warm and dry EYES: No pallor or icterus ENT: MMM CV: RRR PULM: CTA  B ABD: soft, obese, NT, +BS CNS: AAO x 3, non focal EXT: No edema or tenderness      Data Reviewed:   I have personally reviewed following labs and imaging studies:  Labs: Labs show the following:   Basic Metabolic Panel: Recent Labs  Lab 12/13/24 1359 12/13/24 1641 12/14/24 0512 12/15/24 0556  NA 135 136 143 141  K 3.8 3.8 2.8* 3.5  CL 100 103 109 109  CO2 16* 17* 22 21*  GLUCOSE 533* 380* 156* 203*  BUN 25* 23 19 17   CREATININE 1.15* 1.03* 0.95 1.09*  CALCIUM  9.1 8.8* 8.5* 8.6*  MG  --   --  1.5* 1.8  PHOS  --   --  3.4 3.1   GFR Estimated Creatinine Clearance: 54.8 mL/min (A) (by C-G formula based on SCr of 1.09 mg/dL (H)). Liver Function Tests: Recent Labs  Lab 12/14/24 0512 12/15/24 0556  AST 40  --   ALT 32  --   ALKPHOS 72  --   BILITOT 0.4  --   PROT 6.0*  --   ALBUMIN  3.4* 3.7   No results for input(s): LIPASE, AMYLASE in the last 168 hours. No results for input(s): AMMONIA in the last 168 hours. Coagulation profile Recent Labs  Lab 12/13/24 2158  INR 1.2    CBC: Recent Labs  Lab 12/13/24 1359 12/14/24 0512 12/15/24 0556  WBC 9.3 10.6* 8.6  HGB 14.0 11.6* 12.4  HCT 43.1 34.6* 37.5  MCV 84.8 83.2 84.8  PLT 251 211 183   Cardiac Enzymes: No results for input(s): CKTOTAL, CKMB, CKMBINDEX, TROPONINI in the last 168 hours. BNP (last 3 results) No results for input(s): PROBNP in the last 8760 hours. CBG: Recent Labs  Lab 12/14/24 0816 12/14/24 1137 12/14/24 1642 12/14/24 2012 12/15/24 0723  GLUCAP 133* 182* 350* 336* 203*   D-Dimer: No results for input(s): DDIMER in the last 72 hours. Hgb A1c: Recent Labs    12/14/24 0512  HGBA1C 11.1*   Lipid Profile: No results for input(s): CHOL, HDL, LDLCALC, TRIG, CHOLHDL, LDLDIRECT in the last 72 hours. Thyroid  function studies: No results for input(s): TSH, T4TOTAL, T3FREE, THYROIDAB in the last 72 hours.  Invalid input(s): FREET3 Anemia  work up: No results for input(s): VITAMINB12, FOLATE, FERRITIN, TIBC, IRON, RETICCTPCT in the last 72 hours. Sepsis Labs: Recent Labs  Lab 12/13/24 1359 12/14/24 0512 12/15/24 0556  WBC 9.3 10.6* 8.6    Microbiology Recent Results (from the past 240 hours)  Urine Culture     Status: Abnormal   Collection Time: 12/11/24  5:16 PM   Specimen: Urine, Clean Catch  Result Value Ref Range Status   Specimen Description   Final    URINE, CLEAN CATCH Performed at [facility], [address], [place], [place] [address]    Special Requests   Final    NONE Performed at [facility], [address], [place], [place] [address]    Culture >=100,000 COLONIES/mL ESCHERICHIA COLI (A)  Final   Report Status 12/14/2024 FINAL  Final   Organism ID, Bacteria ESCHERICHIA COLI (A)  Final      Susceptibility   Escherichia coli - MIC*    AMPICILLIN <=2 SENSITIVE Sensitive     CEFAZOLIN  (URINE) Value in next row Sensitive      <=1 SENSITIVEThis is a modified FDA-approved test that has been validated and its performance characteristics determined by the reporting laboratory.  This laboratory is certified under the Clinical Laboratory Improvement Amendments CLIA as qualified to perform high complexity clinical laboratory testing.    CEFEPIME Value in next row Sensitive      <=1 SENSITIVEThis is a modified FDA-approved test that has been validated and its performance characteristics determined by the reporting laboratory.  This laboratory is certified under the Clinical Laboratory Improvement Amendments CLIA as qualified to perform high complexity clinical laboratory testing.    ERTAPENEM Value in next row Sensitive      <=  1 SENSITIVEThis is a modified FDA-approved test that has been validated and its performance characteristics determined by the reporting laboratory.  This laboratory is certified under the Clinical Laboratory Improvement Amendments CLIA as qualified to perform high complexity  clinical laboratory testing.    CEFTRIAXONE  Value in next row Sensitive      <=1 SENSITIVEThis is a modified FDA-approved test that has been validated and its performance characteristics determined by the reporting laboratory.  This laboratory is certified under the Clinical Laboratory Improvement Amendments CLIA as qualified to perform high complexity clinical laboratory testing.    CIPROFLOXACIN  Value in next row Sensitive      <=1 SENSITIVEThis is a modified FDA-approved test that has been validated and its performance characteristics determined by the reporting laboratory.  This laboratory is certified under the Clinical Laboratory Improvement Amendments CLIA as qualified to perform high complexity clinical laboratory testing.    GENTAMICIN Value in next row Sensitive      <=1 SENSITIVEThis is a modified FDA-approved test that has been validated and its performance characteristics determined by the reporting laboratory.  This laboratory is certified under the Clinical Laboratory Improvement Amendments CLIA as qualified to perform high complexity clinical laboratory testing.    NITROFURANTOIN  Value in next row Sensitive      <=1 SENSITIVEThis is a modified FDA-approved test that has been validated and its performance characteristics determined by the reporting laboratory.  This laboratory is certified under the Clinical Laboratory Improvement Amendments CLIA as qualified to perform high complexity clinical laboratory testing.    TRIMETH /SULFA  Value in next row Sensitive      <=1 SENSITIVEThis is a modified FDA-approved test that has been validated and its performance characteristics determined by the reporting laboratory.  This laboratory is certified under the Clinical Laboratory Improvement Amendments CLIA as qualified to perform high complexity clinical laboratory testing.    AMPICILLIN/SULBACTAM Value in next row Sensitive      <=1 SENSITIVEThis is a modified FDA-approved test that has been  validated and its performance characteristics determined by the reporting laboratory.  This laboratory is certified under the Clinical Laboratory Improvement Amendments CLIA as qualified to perform high complexity clinical laboratory testing.    PIP/TAZO Value in next row Sensitive      <=4 SENSITIVEThis is a modified FDA-approved test that has been validated and its performance characteristics determined by the reporting laboratory.  This laboratory is certified under the Clinical Laboratory Improvement Amendments CLIA as qualified to perform high complexity clinical laboratory testing.    MEROPENEM Value in next row Sensitive      <=4 SENSITIVEThis is a modified FDA-approved test that has been validated and its performance characteristics determined by the reporting laboratory.  This laboratory is certified under the Clinical Laboratory Improvement Amendments CLIA as qualified to perform high complexity clinical laboratory testing.    * >=100,000 COLONIES/mL ESCHERICHIA COLI    Procedures and diagnostic studies:  ECHOCARDIOGRAM COMPLETE Result Date: 12/14/2024    ECHOCARDIOGRAM REPORT   Patient Name:   Karina Green Pine Date of Exam: 12/14/2024 Medical Rec #:  [id number]     Height:       63.0 in Accession #:    [id number]    Weight:       217.0 lb Date of Birth:  Dec 07, 1956     BSA:          2.002 m Patient Age:    68 years      BP:  124/58 mmHg Patient Gender: F             HR:           73 bpm. Exam Location:  Inpatient Procedure: 2D Echo, Cardiac Doppler and Color Doppler (Both Spectral and Color            Flow Doppler were utilized during procedure). Indications:    Chest Pain R07.9  History:        Patient has prior history of Echocardiogram examinations, most                 recent 02/18/2023. Previous Myocardial Infarction; Risk                 Factors:Diabetes and Hypertension.                 Aortic Valve: 29 mm Medtronic stented (TAVR) valve is present in                 the aortic  position.  Sonographer:    Merlynn Argyle Referring Phys: [id number] GILLIAN HERO [facility]  Sonographer Comments: Image acquisition challenging due to respiratory motion and Image acquisition challenging due to patient body habitus. IMPRESSIONS  1. Left ventricular ejection fraction, by estimation, is 60 to 65%. The left ventricle has normal function. The left ventricle has no regional wall motion abnormalities. Left ventricular diastolic parameters are consistent with Grade II diastolic dysfunction (pseudonormalization).  2. Right ventricular systolic function is normal. The right ventricular size is normal. There is mildly elevated pulmonary artery systolic pressure.  3. Left atrial size was mildly dilated.  4. The mitral valve is degenerative. No evidence of mitral valve regurgitation. No evidence of mitral stenosis. Moderate mitral annular calcification.  5. The aortic valve has been repaired/replaced. Aortic valve regurgitation is not visualized. No aortic stenosis is present. There is a 29 mm Medtronic stented (TAVR) valve present in the aortic position. Echo findings are consistent with normal structure and function of the aortic valve prosthesis. Aortic valve area, by VTI measures 2.12 cm. Aortic valve mean gradient measures 7.5 mmHg. Aortic valve Vmax measures 1.78 m/s.  6. The inferior vena cava is normal in size with greater than 50% respiratory variability, suggesting right atrial pressure of 3 mmHg. FINDINGS  Left Ventricle: Left ventricular ejection fraction, by estimation, is 60 to 65%. The left ventricle has normal function. The left ventricle has no regional wall motion abnormalities. The left ventricular internal cavity size was normal in size. There is  no left ventricular hypertrophy. Left ventricular diastolic parameters are consistent with Grade II diastolic dysfunction (pseudonormalization). Right Ventricle: The right ventricular size is normal. No increase in right ventricular wall thickness. Right  ventricular systolic function is normal. There is mildly elevated pulmonary artery systolic pressure. The tricuspid regurgitant velocity is 2.92  m/s, and with an assumed right atrial pressure of 8 mmHg, the estimated right ventricular systolic pressure is 42.1 mmHg. Left Atrium: Left atrial size was mildly dilated. Right Atrium: Right atrial size was normal in size. Pericardium: There is no evidence of pericardial effusion. Mitral Valve: The mitral valve is degenerative in appearance. Moderate mitral annular calcification. No evidence of mitral valve regurgitation. No evidence of mitral valve stenosis. Tricuspid Valve: The tricuspid valve is normal in structure. Tricuspid valve regurgitation is not demonstrated. No evidence of tricuspid stenosis. Aortic Valve: The aortic valve has been repaired/replaced. Aortic valve regurgitation is not visualized. No aortic stenosis is present. Aortic valve mean gradient measures 7.5  mmHg. Aortic valve peak gradient measures 12.7 mmHg. Aortic valve area, by VTI  measures 2.12 cm. There is a 29 mm Medtronic stented (TAVR) valve present in the aortic position. Echo findings are consistent with normal structure and function of the aortic valve prosthesis. Pulmonic Valve: The pulmonic valve was normal in structure. Pulmonic valve regurgitation is not visualized. No evidence of pulmonic stenosis. Aorta: The aortic root is normal in size and structure. Venous: The inferior vena cava is normal in size with greater than 50% respiratory variability, suggesting right atrial pressure of 3 mmHg. IAS/Shunts: No atrial level shunt detected by color flow Doppler.  LEFT VENTRICLE PLAX 2D LVIDd:         5.00 cm   Diastology LVIDs:         3.70 cm   LV e' medial:    6.57 cm/s LV PW:         1.00 cm   LV E/e' medial:  25.9 LV IVS:        0.90 cm   LV e' lateral:   6.16 cm/s LVOT diam:     2.00 cm   LV E/e' lateral: 27.6 LV SV:         89 LV SV Index:   45 LVOT Area:     3.14 cm  RIGHT VENTRICLE             IVC RV Basal diam:  2.80 cm    IVC diam: 2.50 cm RV S prime:     8.68 cm/s TAPSE (M-mode): 1.2 cm     PULMONARY VEINS                            Diastolic Velocity: 82.60 cm/s                            S/D Velocity:       0.80                            Systolic Velocity:  65.70 cm/s LEFT ATRIUM             Index        RIGHT ATRIUM           Index LA diam:        4.90 cm 2.45 cm/m   RA Area:     16.80 cm LA Vol (A2C):   45.0 ml 22.48 ml/m  RA Volume:   39.50 ml  19.73 ml/m LA Vol (A4C):   53.9 ml 26.92 ml/m LA Biplane Vol: 52.4 ml 26.18 ml/m  AORTIC VALVE AV Area (Vmax):    2.13 cm AV Area (Vmean):   2.02 cm AV Area (VTI):     2.12 cm AV Vmax:           178.50 cm/s AV Vmean:          130.000 cm/s AV VTI:            0.420 m AV Peak Grad:      12.7 mmHg AV Mean Grad:      7.5 mmHg LVOT Vmax:         121.00 cm/s LVOT Vmean:        83.600 cm/s LVOT VTI:          0.284 m LVOT/AV VTI ratio: 0.68  AORTA Ao Root diam: 2.80  cm Ao Asc diam:  3.20 cm MITRAL VALVE                TRICUSPID VALVE MV Area (PHT): 2.03 cm     TR Peak grad:   34.1 mmHg MV Decel Time: 373 msec     TR Vmax:        292.00 cm/s MV E velocity: 170.00 cm/s MV A velocity: 112.00 cm/s  SHUNTS MV E/A ratio:  1.52         Systemic VTI:  0.28 m                             Systemic Diam: 2.00 cm Toribio Fuel MD Electronically signed by Toribio Fuel MD Signature Date/Time: 12/14/2024/11:17:18 AM    Final    DG Chest Portable 1 View Result Date: 12/13/2024 CLINICAL DATA:  Chest pain EXAM: PORTABLE CHEST 1 VIEW COMPARISON:  February 06, 2022 FINDINGS: The cardiomediastinal silhouette is unchanged and enlarged in contour.Post median sternotomy and TAVR. No pleural effusion. No pneumothorax. No focal consolidation. Similar appearance of mild reticular prominence and peribronchial cuffing. IMPRESSION: Similar appearance of mild reticular prominence and peribronchial cuffing which may reflect mild pulmonary edema or chronic small  airways disease. Electronically Signed   By: Corean Salter M.D.   On: 12/13/2024 14:02               LOS: 2 days   Cataleia Gade  [facility]   Pager on [email or web address]. If 7PM-7AM, please contact night-coverage at [email or web address]     12/15/2024, 9:57 AM           "

## 2024-12-15 NOTE — Consult Note (Signed)
 PHARMACY - ANTICOAGULATION  Pharmacy Consult for Heparin  Indication: chest pain/ACS Brief A/P: Heparin  level subtherapeutic Increase Heparin  rate  Allergies[1]  Patient Measurements: Height: 5' 3 (160 cm) Weight: 102.1 kg (225 lb 1.4 oz) IBW/kg (Calculated) : 52.4 HEPARIN  DW (KG): 76.6  Vital Signs: Temp: 97.6 F (36.4 C) (12/26 1123) Temp Source: Axillary (12/26 1123) BP: 169/71 (12/26 1123) Pulse Rate: 47 (12/26 1123)  Labs: Recent Labs    12/13/24 1359 12/13/24 1641 12/13/24 2158 12/14/24 0512 12/14/24 1349 12/15/24 0556  HGB 14.0  --   --  11.6*  --  12.4  HCT 43.1  --   --  34.6*  --  37.5  PLT 251  --   --  211  --  183  APTT  --   --  135*  --   --   --   LABPROT  --   --  15.8*  --   --   --   INR  --   --  1.2  --   --   --   HEPARINUNFRC  --   --   --  0.18* 0.35 0.37  CREATININE 1.15* 1.03*  --  0.95  --  1.09*    Estimated Creatinine Clearance: 54.8 mL/min (A) (by C-G formula based on SCr of 1.09 mg/dL (H)).  Assessment: 68 y.o. female with CAD/NSTEMI and pharmacy consulted for dosing IV heparin . Cardiology consulted; possible cath tomorrow 12/26. No on anticoagulation PTA. Hgb 14 > 11.6; Plt wnl.   Heparin  level therapeutic at 0.37 on infusion of 1150 units/h. No issues with heparin  infusion or bleeding reported.   Goal of Therapy:  Heparin  level 0.3-0.7 units/ml Monitor platelets by anticoagulation protocol: Yes   Plan:  Pt now s/p cath and heparin  d/c'd.  Karina Green, Karina Green, BCPS, BCCP Clinical Pharmacist  12/15/2024 1:50 PM   [facility] pharmacy phone numbers are listed on [email or web address]       [1]  Allergies Allergen Reactions   Iodinated Contrast Media Rash    Very large itchy rash within 24 hours of heart cath

## 2024-12-15 NOTE — Progress Notes (Addendum)
 "  Progress Note  Patient Name: Karina Green Date of Encounter: 12/15/2024 [facility] Cardiologist: Karina Carrier, MD   Interval Summary   No chest pain or shortness of breath today.  Weight  Vital Signs Vitals:   12/14/24 2315 12/14/24 2352 12/15/24 0445 12/15/24 0723  BP: (!) 150/76 (!) 150/54 (!) 168/72 (!) 156/67  Pulse: 93 70 65 65  Resp:  20 18 16   Temp:  97.9 F (36.6 C) 97.9 F (36.6 C) 97.7 F (36.5 C)  TempSrc:  Oral Oral Oral  SpO2:  95% 95% 97%  Weight:   102.1 kg   Height:        Intake/Output Summary (Last 24 hours) at 12/15/2024 0839 Last data filed at 12/14/2024 2357 Gross per 24 hour  Intake 1041.58 ml  Output 1150 ml  Net -108.42 ml      12/15/2024    4:45 AM 12/14/2024    5:00 AM 12/13/2024    1:20 PM  Last 3 Weights  Weight (lbs) 225 lb 1.4 oz 217 lb 226 lb  Weight (kg) 102.1 kg 98.431 kg 102.513 kg      Telemetry/ECG  Predominantly sinus rhythm.  Add 1500 yesterday had episode of atrial fibrillation with controlled ventricular rates.  Converted back to sinus rhythm around 0300- Personally Reviewed  Physical Exam  GEN: No acute distress.   Neck: No JVD Cardiac: RRR, no murmurs, rubs, or gallops.  Respiratory: Clear to auscultation bilaterally. GI: Soft, nontender, non-distended  MS: No edema  Patient Profile Patient with past medical history significant for CAD status post CABG 2015, aortic stenosis status post TAVR 2023, hypertension, lung nodule, diabetes, recent UTI  Patient admitted for DKA, had elevated troponins and treated for NSTEMI.  Assessment & Plan   NSTEMI CAD status post CABG 2015 Hyperlipidemia -12/2021 LHC patent LIMA to LAD, chronic occlusion of SVG to OM, patent RCA, 50 to 75% stenosis in LCx, LAD, distal left main PTA reported fleeting, brief episodes of chest pain with activity.  EKG with chronic T wave inversions anterolaterally.  Troponins 21-54-102 in the setting of DKA.  She reports mild symptoms  prior to admission but with known disease on pre-TAVR heart catheterization.  Echocardiogram with no wall motion abnormalities.  Preserved EF.  Will need to discuss with MD if he wants to pursue cardiac catheterization today versus medical management.  She has had no chest pain while inpatient. Continue with IV heparin , aspirin , atorvastatin  80 mg, Lopressor  25 mg twice daily, Imdur  30 mg. PCSK9 considered outpatient previously. She would be agreeable to cardiac catheterization.  New onset atrial fibrillation On telemetry here at approximately 1500 yesterday she went into atrial fibrillation with controlled ventricular rates.  Converted back to sinus rhythm around a 0300 today and maintaining sinus rhythm.  She is unaware of her atrial fibrillation. Continue with IV heparin , will need to start Eliquis 5 mg twice daily depending on plans for cardiac catheterization. Will need to take this into consideration if she has indications for DAPT therapy. Outpatient would recommend sleep study, reports snoring.  Check TSH.  Aortic stenosis status post TAVR 2023 Echocardiogram this admission demonstrates preserved biventricular function, G2 DD.  Stable structure and function of aortic valve prosthesis.  Chronic HFpEF Appears euvolemic.  Continue losartan  25 mg, Lasix  20 mg daily.  Avoid SGLT2 inhibitor with recent UTI and DKA.  DKA Management per primary team.  Informed Consent   Shared Decision Making/Informed Consent The risks [stroke (1 in 1000), death (1  in 1000), kidney failure [usually temporary] (1 in 500), bleeding (1 in 200), allergic reaction [possibly serious] (1 in 200)], benefits (diagnostic support and management of coronary artery disease) and alternatives of a cardiac catheterization were discussed in detail with Karina Green and she is willing to proceed.      For questions or updates, please contact [facility] Please consult [email or web address] for contact info under        Signed, Karina LITTIE Sluder, PA-C     Agree with note by Karina Sluder, PA-C  Patient admitted with DKA and chest pain with non-STEMI.  She has had bypass surgery in 2015, cath in 23 showed an occluded vein to an OM, patent LIMA to the LAD with moderately severe left main disease.  Her right had nonobstructive disease.  She is status post TAVR.  Her troponins went up to 102.  Her EKG shows anterior T wave inversion which is unchanged from prior EKGs.  I favor repeat catheterization since she has an unprotected circumflex given her occluded vein graft.  Of note, the A-fib was related to another patient and not this patient so therefore not a clinical history.  Karina Green, M.D., FACP, [facility], FAHA, [facility]  [address], [address] [place], [place]  [address]  [phone] 12/15/2024 10:04 AM   Addendum: Catheterization today performed Dr. Verlin shows anatomy unchanged from her previous previous catheterization.  There are no culprit lesions.  Okay for discharge.  We will arrange outpatient follow-up with Dr. Dorn Green.  Karina Green, M.D., FACP, [facility], FAHA, [facility]  [address], [address] [place], [place]  [address]  [phone] 12/15/2024 2:44 PM  "

## 2024-12-15 NOTE — Telephone Encounter (Signed)
 Pharmacy Patient Advocate Encounter  Insurance verification completed.    The patient is insured through [employer]. Patient has Medicare and is not eligible for a copay card, but may be able to apply for patient assistance or Medicare RX Payment Plan (Patient Must reach out to their plan, if eligible for payment plan), if available.    Ran test claim for Eliquis  5mg  tablet and the current 30 day co-pay is $152.71.   This test claim was processed through [facility]- copay amounts may vary at other pharmacies due to pharmacy/plan contracts, or as the patient moves through the different stages of their insurance plan.

## 2024-12-15 NOTE — H&P (View-Only) (Signed)
 IMPORTANT NOTE:  Telemetry on this patient had been switched at some point with another patient so the atrial fibrillation that was seen was not this patient's telemetry.  Safety zone has been made.    This patient does not have atrial fibrillation at this current time.

## 2024-12-16 ENCOUNTER — Other Ambulatory Visit (HOSPITAL_COMMUNITY): Payer: Self-pay

## 2024-12-16 LAB — BASIC METABOLIC PANEL WITH GFR
Anion gap: 13 (ref 5–15)
BUN: 19 mg/dL (ref 8–23)
CO2: 21 mmol/L — ABNORMAL LOW (ref 22–32)
Calcium: 9.4 mg/dL (ref 8.9–10.3)
Chloride: 104 mmol/L (ref 98–111)
Creatinine, Ser: 0.99 mg/dL (ref 0.44–1.00)
GFR, Estimated: >60 mL/min
Glucose, Bld: 201 mg/dL — ABNORMAL HIGH (ref 70–99)
Potassium: 3.5 mmol/L (ref 3.5–5.1)
Sodium: 138 mmol/L (ref 135–145)

## 2024-12-16 LAB — CBC
HCT: 39.3 % (ref 36.0–46.0)
Hemoglobin: 13.2 g/dL (ref 12.0–15.0)
MCH: 27.8 pg (ref 26.0–34.0)
MCHC: 33.6 g/dL (ref 30.0–36.0)
MCV: 82.7 fL (ref 80.0–100.0)
Platelets: 248 K/uL (ref 150–400)
RBC: 4.75 MIL/uL (ref 3.87–5.11)
RDW: 14.6 % (ref 11.5–15.5)
WBC: 16.3 K/uL — ABNORMAL HIGH (ref 4.0–10.5)
nRBC: 0 % (ref 0.0–0.2)

## 2024-12-16 LAB — TSH: TSH: 0.307 u[IU]/mL — ABNORMAL LOW (ref 0.350–4.500)

## 2024-12-16 LAB — GLUCOSE, CAPILLARY: Glucose-Capillary: 194 mg/dL — ABNORMAL HIGH (ref 70–99)

## 2024-12-16 MED ORDER — INSULIN PEN NEEDLE 32G X 4 MM MISC
1.0000 | 0 refills | Status: AC
  Filled 2024-12-16: qty 100, 30d supply, fill #0

## 2024-12-16 MED ORDER — BLOOD GLUCOSE TEST VI STRP
1.0000 | ORAL_STRIP | 0 refills | Status: AC
  Filled 2024-12-16: qty 100, 33d supply, fill #0

## 2024-12-16 MED ORDER — LOSARTAN POTASSIUM 25 MG PO TABS: 25.0000 mg | ORAL_TABLET | Freq: Every day | ORAL | Status: DC

## 2024-12-16 MED ORDER — LANCET DEVICE MISC
1.0000 | 0 refills | Status: AC
  Filled 2024-12-16: qty 1, 30d supply, fill #0

## 2024-12-16 MED ORDER — INSULIN GLARGINE 100 UNIT/ML ~~LOC~~ SOLN
25.0000 [IU] | Freq: Every day | SUBCUTANEOUS | Status: DC
  Administered 2024-12-16: 25 [IU] via SUBCUTANEOUS
  Filled 2024-12-16: qty 0.25

## 2024-12-16 MED ORDER — INSULIN GLARGINE 100 UNIT/ML SOLOSTAR PEN
25.0000 [IU] | PEN_INJECTOR | Freq: Every day | SUBCUTANEOUS | 0 refills | Status: AC
  Filled 2024-12-16: qty 15, 60d supply, fill #0
  Filled 2024-12-16: qty 6, 24d supply, fill #0

## 2024-12-16 MED ORDER — BAQSIMI TWO PACK 3 MG/DOSE NA POWD
3.0000 mg | NASAL | 0 refills | Status: AC | PRN
  Filled 2024-12-16: qty 2, 30d supply, fill #0

## 2024-12-16 MED ORDER — BLOOD GLUCOSE MONITOR SYSTEM W/DEVICE KIT
1.0000 | PACK | 0 refills | Status: AC
  Filled 2024-12-16: qty 1, 1d supply, fill #0

## 2024-12-16 MED ORDER — LANCETS MISC
1.0000 | 0 refills | Status: AC
  Filled 2024-12-16: qty 100, 30d supply, fill #0

## 2024-12-16 NOTE — Discharge Summary (Signed)
 " Physician Discharge Summary   Patient: Karina Green MRN: [id number] DOB: 11/04/1956  Admit date:     12/13/2024  Discharge date: 12/16/2024  Discharge Physician: AIDA CHO   PCP: Pcp, No   Recommendations at discharge:   With PCP in 1 week Follow-up with Dr. Alvan, your cardiologist, within 2 weeks of discharge  Discharge Diagnoses: Principal Problem:   NSTEMI (non-ST elevated myocardial infarction) [facility]) Active Problems:   Chest pain   Elevated troponin  Resolved Problems:   * No resolved hospital problems. *  Hospital Course:  Karina Green is a 68 y.o. female  with medical history significant of hypertension, hyperlipidemia, chronic diastolic heart failure, T2DM, CAD s/p CABG, severe aortic stenosis s/p TAVR (01/2022), OA, recent diagnosis of UTI after visit to the urgent care clinic on 12/11/2024.  She was also started on prednisone  a day prior to admission for acute gouty arthritis on the left hand.  She is a known diabetic but had run out of Glimepiride  about a month prior to admission.  She presented to the hospital with chest pain that has been going on on and off intermittently for about 2 weeks.  It typically would okay with exertion.  However, on the day of admission, pain was persistent and occurred even at rest.  It was more severe and lasted about 30 minutes.     Troponin was elevated: 21, 54, and oriented Glucose elevated at 533 with bicarb of 16 and anion gap of 19.     Chest x-ray IMPRESSION: Similar appearance of mild reticular prominence and peribronchial cuffing which may reflect mild pulmonary edema or chronic small airways disease.   EKG Normal sinus rhythm, T wave inversion is seen 1, aVL, V1 and V2.      Assessment and Plan:   Acute NSTEMI: S/p left heart cath on 12/15/2024 showed stable CAD with no change since last catheterization in 2023.  There is moderate distal left main stenosis, severe proximal LAD stenosis, patent LIMA to LAD. 2D  echo showed EF estimated at 60 to 65%, grade 2 diastolic dysfunction, mildly elevated pulmonary artery systolic pressure, moderately dilated left atrium.  Normal structure and function of aortic valve prosthesis.     Type II DM with DKA: DKA has resolved. Glucose levels are better.  She will be discharged on Lantus  25 units daily. She has been educated on insulin  management including recognition and management of hypoglycemia. Hemoglobin A1c was 11.1. The importance of close monitoring and medical adherence was reiterated.  She understands that she needs to follow-up with PCP for ongoing management of diabetes.     Acute gouty arthritis of left hand: Improved.  Continue allopurinol  at discharge. She said she started taking prednisone  a day prior to admission.       Recently diagnosed E. coli UTI from urine specimen on 12/11/2024: Culture positive for pansensitive E. coli.  Continue Keflex .     Hypokalemia: Improved.  Continue potassium repletion at this discharge Hypomagnesemia: Improved.     Chronic diastolic CHF: Compensated. 2D echo in February 2024 showed EF estimated 65%, grade 2 diastolic dysfunction.     Comorbidities include hypertension, hyperlipidemia, GERD    Her condition has improved and she is doing stable for discharge to home today.             Consultants: Cardiologist Procedures performed: Left heart cath Disposition: Home Diet recommendation:  Discharge Diet Orders (From admission, onward)     Start     Ordered  12/16/24 0000  Diet Carb Modified        12/16/24 0931   12/16/24 0000  Diet - low sodium heart healthy        12/16/24 0931           Cardiac and Carb modified diet DISCHARGE MEDICATION: Allergies as of 12/16/2024       Reactions   Iodinated Contrast Media Rash   Very large itchy rash within 24 hours of heart cath         Medication List     STOP taking these medications    glimepiride  2 MG tablet Commonly known as:  AMARYL    predniSONE  20 MG tablet Commonly known as: DELTASONE        TAKE these medications    albuterol  108 (90 Base) MCG/ACT inhaler Commonly known as: VENTOLIN  HFA Inhale 2 puffs into the lungs every 4 (four) hours as needed for wheezing or shortness of breath.   allopurinol  100 MG tablet Commonly known as: ZYLOPRIM  Take 100 mg by mouth daily.   aspirin  EC 81 MG tablet Take 81 mg by mouth daily.   atorvastatin  80 MG tablet Commonly known as: LIPITOR  TAKE ONE TABLET BY MOUTH ONCE DAILY AT 6 P.M. What changed:  how much to take how to take this when to take this additional instructions   Baqsimi  Two Pack 3 MG/DOSE Powd Generic drug: Glucagon  Place 3 mg into the nose as needed for up to 2 doses (Severe low blood sugar). Give 3 mg (one actuation) into a single nostril.   Blood Glucose Monitoring Suppl Devi 1 each by Does not apply route as directed. Dispense based on patient and insurance preference. Use up to four times daily as directed. (FOR ICD-10 E10.9, E11.9).   BLOOD GLUCOSE TEST STRIPS Strp 1 each by Does not apply route as directed. Dispense based on patient and insurance preference. Use as directed. (FOR ICD-10 E10.9, E11.9).   cephALEXin  500 MG capsule Commonly known as: KEFLEX  Take 1 capsule (500 mg total) by mouth 2 (two) times daily.   famotidine  20 MG tablet Commonly known as: PEPCID  One daily after supper   Fish Oil 1000 MG Caps Take 1,000 mg by mouth daily.   furosemide  20 MG tablet Commonly known as: LASIX  Take 1 tablet (20 mg total) by mouth daily.   insulin  glargine 100 UNIT/ML Solostar Pen Commonly known as: LANTUS  Inject 25 Units into the skin daily. May substitute as needed per insurance. Start taking on: December 17, 2024   isosorbide  mononitrate 30 MG 24 hr tablet Commonly known as: IMDUR  Take 0.5 tablets (15 mg total) by mouth daily.   Lancet Device Misc 1 each by Does not apply route as directed. Dispense based on patient and  insurance preference. Use as directed. (FOR ICD-10 E10.9, E11.9).   Lancets Misc 1 each by Does not apply route as directed. Dispense based on patient and insurance preference. Use as directed. (FOR ICD-10 E10.9, E11.9).   losartan  25 MG tablet Commonly known as: COZAAR  Take 1 tablet (25 mg total) by mouth daily. What changed: how much to take   metoprolol  tartrate 25 MG tablet Commonly known as: LOPRESSOR  Take 1 tablet (25 mg total) by mouth 2 (two) times daily.   multivitamin with minerals Tabs tablet Take 1 tablet by mouth daily.   pantoprazole  40 MG tablet Commonly known as: Protonix  Take 1 tablet (40 mg total) by mouth daily. Take 30-60 min before first meal of the day   Pen Needles  31G X 5 MM Misc 1 each by Does not apply route as directed. Dispense based on patient and insurance preference. Use as directed. (FOR ICD-10 E10.9, E11.9).   Potassium Chloride  ER 20 MEQ Tbcr Take 1 tablet by mouth daily.        Discharge Exam: Filed Weights   12/14/24 0500 12/15/24 0445 12/16/24 9361  Weight: 98.4 kg 102.1 kg 96 kg   GEN: NAD SKIN: Warm and dry EYES: No pallor or icterus ENT: MMM CV: RRR PULM: CTA B ABD: soft, ND, NT, +BS CNS: AAO x 3, non focal EXT: No edema or tenderness   Condition at discharge: good  The results of significant diagnostics from this hospitalization (including imaging, microbiology, ancillary and laboratory) are listed below for reference.   Imaging Studies: CARDIAC CATHETERIZATION Result Date: 12/15/2024   Dist LM lesion is 70% stenosed.   Ost Cx lesion is 50% stenosed.   Mid RCA lesion is 40% stenosed.   Origin to Prox Graft lesion is 100% stenosed.   Ost LAD to Prox LAD lesion is 90% stenosed.   Prox RCA lesion is 60% stenosed.   SVG graft was not visualized.   LIMA graft was visualized by angiography. Stable CAD without change since last catheterization in 2023. Moderate distal left main stenosis Severe proximal LAD stenosis. Patent LIMA  to LAD Moderate ostial Circumflex stenosis. The vein graft to the OM branch is known to be occluded and was not injected. The RCA is a large dominant vessel with moderate proximal stenosis (unchanged from last cath), mild diffuse distal disease. LVEDP=18-22 mmHg Continue medical management of CAD   ECHOCARDIOGRAM COMPLETE Result Date: 12/14/2024    ECHOCARDIOGRAM REPORT   Patient Name:   Karina Green Ebers Date of Exam: 12/14/2024 Medical Rec #:  [id number]     Height:       63.0 in Accession #:    [id number]    Weight:       217.0 lb Date of Birth:  1956/09/14     BSA:          2.002 m Patient Age:    68 years      BP:           124/58 mmHg Patient Gender: F             HR:           73 bpm. Exam Location:  Inpatient Procedure: 2D Echo, Cardiac Doppler and Color Doppler (Both Spectral and Color            Flow Doppler were utilized during procedure). Indications:    Chest Pain R07.9  History:        Patient has prior history of Echocardiogram examinations, most                 recent 02/18/2023. Previous Myocardial Infarction; Risk                 Factors:Diabetes and Hypertension.                 Aortic Valve: 29 mm Medtronic stented (TAVR) valve is present in                 the aortic position.  Sonographer:    Merlynn Argyle Referring Phys: [id number] GILLIAN HERO [facility]  Sonographer Comments: Image acquisition challenging due to respiratory motion and Image acquisition challenging due to patient body habitus. IMPRESSIONS  1. Left ventricular ejection fraction, by estimation, is 60 to  65%. The left ventricle has normal function. The left ventricle has no regional wall motion abnormalities. Left ventricular diastolic parameters are consistent with Grade II diastolic dysfunction (pseudonormalization).  2. Right ventricular systolic function is normal. The right ventricular size is normal. There is mildly elevated pulmonary artery systolic pressure.  3. Left atrial size was mildly dilated.  4. The mitral valve is degenerative. No  evidence of mitral valve regurgitation. No evidence of mitral stenosis. Moderate mitral annular calcification.  5. The aortic valve has been repaired/replaced. Aortic valve regurgitation is not visualized. No aortic stenosis is present. There is a 29 mm Medtronic stented (TAVR) valve present in the aortic position. Echo findings are consistent with normal structure and function of the aortic valve prosthesis. Aortic valve area, by VTI measures 2.12 cm. Aortic valve mean gradient measures 7.5 mmHg. Aortic valve Vmax measures 1.78 m/s.  6. The inferior vena cava is normal in size with greater than 50% respiratory variability, suggesting right atrial pressure of 3 mmHg. FINDINGS  Left Ventricle: Left ventricular ejection fraction, by estimation, is 60 to 65%. The left ventricle has normal function. The left ventricle has no regional wall motion abnormalities. The left ventricular internal cavity size was normal in size. There is  no left ventricular hypertrophy. Left ventricular diastolic parameters are consistent with Grade II diastolic dysfunction (pseudonormalization). Right Ventricle: The right ventricular size is normal. No increase in right ventricular wall thickness. Right ventricular systolic function is normal. There is mildly elevated pulmonary artery systolic pressure. The tricuspid regurgitant velocity is 2.92  m/s, and with an assumed right atrial pressure of 8 mmHg, the estimated right ventricular systolic pressure is 42.1 mmHg. Left Atrium: Left atrial size was mildly dilated. Right Atrium: Right atrial size was normal in size. Pericardium: There is no evidence of pericardial effusion. Mitral Valve: The mitral valve is degenerative in appearance. Moderate mitral annular calcification. No evidence of mitral valve regurgitation. No evidence of mitral valve stenosis. Tricuspid Valve: The tricuspid valve is normal in structure. Tricuspid valve regurgitation is not demonstrated. No evidence of tricuspid  stenosis. Aortic Valve: The aortic valve has been repaired/replaced. Aortic valve regurgitation is not visualized. No aortic stenosis is present. Aortic valve mean gradient measures 7.5 mmHg. Aortic valve peak gradient measures 12.7 mmHg. Aortic valve area, by VTI  measures 2.12 cm. There is a 29 mm Medtronic stented (TAVR) valve present in the aortic position. Echo findings are consistent with normal structure and function of the aortic valve prosthesis. Pulmonic Valve: The pulmonic valve was normal in structure. Pulmonic valve regurgitation is not visualized. No evidence of pulmonic stenosis. Aorta: The aortic root is normal in size and structure. Venous: The inferior vena cava is normal in size with greater than 50% respiratory variability, suggesting right atrial pressure of 3 mmHg. IAS/Shunts: No atrial level shunt detected by color flow Doppler.  LEFT VENTRICLE PLAX 2D LVIDd:         5.00 cm   Diastology LVIDs:         3.70 cm   LV e' medial:    6.57 cm/s LV PW:         1.00 cm   LV E/e' medial:  25.9 LV IVS:        0.90 cm   LV e' lateral:   6.16 cm/s LVOT diam:     2.00 cm   LV E/e' lateral: 27.6 LV SV:         89 LV SV Index:   45 LVOT  Area:     3.14 cm  RIGHT VENTRICLE            IVC RV Basal diam:  2.80 cm    IVC diam: 2.50 cm RV S prime:     8.68 cm/s TAPSE (M-mode): 1.2 cm     PULMONARY VEINS                            Diastolic Velocity: 82.60 cm/s                            S/D Velocity:       0.80                            Systolic Velocity:  65.70 cm/s LEFT ATRIUM             Index        RIGHT ATRIUM           Index LA diam:        4.90 cm 2.45 cm/m   RA Area:     16.80 cm LA Vol (A2C):   45.0 ml 22.48 ml/m  RA Volume:   39.50 ml  19.73 ml/m LA Vol (A4C):   53.9 ml 26.92 ml/m LA Biplane Vol: 52.4 ml 26.18 ml/m  AORTIC VALVE AV Area (Vmax):    2.13 cm AV Area (Vmean):   2.02 cm AV Area (VTI):     2.12 cm AV Vmax:           178.50 cm/s AV Vmean:          130.000 cm/s AV VTI:             0.420 m AV Peak Grad:      12.7 mmHg AV Mean Grad:      7.5 mmHg LVOT Vmax:         121.00 cm/s LVOT Vmean:        83.600 cm/s LVOT VTI:          0.284 m LVOT/AV VTI ratio: 0.68  AORTA Ao Root diam: 2.80 cm Ao Asc diam:  3.20 cm MITRAL VALVE                TRICUSPID VALVE MV Area (PHT): 2.03 cm     TR Peak grad:   34.1 mmHg MV Decel Time: 373 msec     TR Vmax:        292.00 cm/s MV E velocity: 170.00 cm/s MV A velocity: 112.00 cm/s  SHUNTS MV E/A ratio:  1.52         Systemic VTI:  0.28 m                             Systemic Diam: 2.00 cm Toribio Fuel MD Electronically signed by Toribio Fuel MD Signature Date/Time: 12/14/2024/11:17:18 AM    Final    DG Chest Portable 1 View Result Date: 12/13/2024 CLINICAL DATA:  Chest pain EXAM: PORTABLE CHEST 1 VIEW COMPARISON:  February 06, 2022 FINDINGS: The cardiomediastinal silhouette is unchanged and enlarged in contour.Post median sternotomy and TAVR. No pleural effusion. No pneumothorax. No focal consolidation. Similar appearance of mild reticular prominence and peribronchial cuffing. IMPRESSION: Similar appearance of mild reticular prominence and peribronchial cuffing which may reflect mild pulmonary edema or chronic small airways  disease. Electronically Signed   By: Corean Salter M.D.   On: 12/13/2024 14:02    Microbiology: Results for orders placed or performed during the hospital encounter of 12/11/24  Urine Culture     Status: Abnormal   Collection Time: 12/11/24  5:16 PM   Specimen: Urine, Clean Catch  Result Value Ref Range Status   Specimen Description   Final    URINE, CLEAN CATCH Performed at [facility], [address], [place], [place] [address]    Special Requests   Final    NONE Performed at [facility], [address], [place], [place] [address]    Culture >=100,000 COLONIES/mL ESCHERICHIA COLI (A)  Final   Report Status 12/14/2024 FINAL  Final   Organism ID, Bacteria ESCHERICHIA COLI (A)  Final      Susceptibility    Escherichia coli - MIC*    AMPICILLIN <=2 SENSITIVE Sensitive     CEFAZOLIN  (URINE) Value in next row Sensitive      <=1 SENSITIVEThis is a modified FDA-approved test that has been validated and its performance characteristics determined by the reporting laboratory.  This laboratory is certified under the Clinical Laboratory Improvement Amendments CLIA as qualified to perform high complexity clinical laboratory testing.    CEFEPIME Value in next row Sensitive      <=1 SENSITIVEThis is a modified FDA-approved test that has been validated and its performance characteristics determined by the reporting laboratory.  This laboratory is certified under the Clinical Laboratory Improvement Amendments CLIA as qualified to perform high complexity clinical laboratory testing.    ERTAPENEM Value in next row Sensitive      <=1 SENSITIVEThis is a modified FDA-approved test that has been validated and its performance characteristics determined by the reporting laboratory.  This laboratory is certified under the Clinical Laboratory Improvement Amendments CLIA as qualified to perform high complexity clinical laboratory testing.    CEFTRIAXONE  Value in next row Sensitive      <=1 SENSITIVEThis is a modified FDA-approved test that has been validated and its performance characteristics determined by the reporting laboratory.  This laboratory is certified under the Clinical Laboratory Improvement Amendments CLIA as qualified to perform high complexity clinical laboratory testing.    CIPROFLOXACIN  Value in next row Sensitive      <=1 SENSITIVEThis is a modified FDA-approved test that has been validated and its performance characteristics determined by the reporting laboratory.  This laboratory is certified under the Clinical Laboratory Improvement Amendments CLIA as qualified to perform high complexity clinical laboratory testing.    GENTAMICIN Value in next row Sensitive      <=1 SENSITIVEThis is a modified FDA-approved  test that has been validated and its performance characteristics determined by the reporting laboratory.  This laboratory is certified under the Clinical Laboratory Improvement Amendments CLIA as qualified to perform high complexity clinical laboratory testing.    NITROFURANTOIN  Value in next row Sensitive      <=1 SENSITIVEThis is a modified FDA-approved test that has been validated and its performance characteristics determined by the reporting laboratory.  This laboratory is certified under the Clinical Laboratory Improvement Amendments CLIA as qualified to perform high complexity clinical laboratory testing.    TRIMETH /SULFA  Value in next row Sensitive      <=1 SENSITIVEThis is a modified FDA-approved test that has been validated and its performance characteristics determined by the reporting laboratory.  This laboratory is certified under the Clinical Laboratory Improvement Amendments CLIA as qualified to perform high complexity clinical laboratory testing.  AMPICILLIN/SULBACTAM Value in next row Sensitive      <=1 SENSITIVEThis is a modified FDA-approved test that has been validated and its performance characteristics determined by the reporting laboratory.  This laboratory is certified under the Clinical Laboratory Improvement Amendments CLIA as qualified to perform high complexity clinical laboratory testing.    PIP/TAZO Value in next row Sensitive      <=4 SENSITIVEThis is a modified FDA-approved test that has been validated and its performance characteristics determined by the reporting laboratory.  This laboratory is certified under the Clinical Laboratory Improvement Amendments CLIA as qualified to perform high complexity clinical laboratory testing.    MEROPENEM Value in next row Sensitive      <=4 SENSITIVEThis is a modified FDA-approved test that has been validated and its performance characteristics determined by the reporting laboratory.  This laboratory is certified under the Clinical  Laboratory Improvement Amendments CLIA as qualified to perform high complexity clinical laboratory testing.    * >=100,000 COLONIES/mL ESCHERICHIA COLI    Labs: CBC: Recent Labs  Lab 12/13/24 1359 12/14/24 0512 12/15/24 0556 12/16/24 0716  WBC 9.3 10.6* 8.6 16.3*  HGB 14.0 11.6* 12.4 13.2  HCT 43.1 34.6* 37.5 39.3  MCV 84.8 83.2 84.8 82.7  PLT 251 211 183 248   Basic Metabolic Panel: Recent Labs  Lab 12/13/24 1359 12/13/24 1641 12/14/24 0512 12/15/24 0556 12/16/24 0716  NA 135 136 143 141 138  K 3.8 3.8 2.8* 3.5 3.5  CL 100 103 109 109 104  CO2 16* 17* 22 21* 21*  GLUCOSE 533* 380* 156* 203* 201*  BUN 25* 23 19 17 19   CREATININE 1.15* 1.03* 0.95 1.09* 0.99  CALCIUM  9.1 8.8* 8.5* 8.6* 9.4  MG  --   --  1.5* 1.8  --   PHOS  --   --  3.4 3.1  --    Liver Function Tests: Recent Labs  Lab 12/14/24 0512 12/15/24 0556  AST 40  --   ALT 32  --   ALKPHOS 72  --   BILITOT 0.4  --   PROT 6.0*  --   ALBUMIN  3.4* 3.7   CBG: Recent Labs  Lab 12/15/24 0723 12/15/24 1121 12/15/24 1654 12/15/24 2149 12/16/24 0808  GLUCAP 203* 175* 382* 378* 194*    Discharge time spent: greater than 30 minutes.  Signed: AIDA CHO, MD [facility] 12/16/2024 "

## 2024-12-16 NOTE — Plan of Care (Signed)
  Problem: Clinical Measurements: Goal: Ability to maintain clinical measurements within normal limits will improve Outcome: Progressing   Problem: Clinical Measurements: Goal: Respiratory complications will improve Outcome: Progressing   

## 2024-12-18 ENCOUNTER — Encounter (HOSPITAL_COMMUNITY): Payer: Self-pay | Admitting: Cardiovascular Disease

## 2024-12-18 NOTE — Progress Notes (Signed)
 Patient discharged, Important Message Letter mailed to patient.

## 2024-12-18 NOTE — Care Management Important Message (Signed)
 Important Message  Patient Details  Name: Karina Green MRN: [id number] Date of Birth: [birth year]   Important Message Given:  No     Jennie Laneta Dragon 12/18/2024, 12:37 PM

## 2024-12-25 ENCOUNTER — Ambulatory Visit: Attending: Nurse Practitioner | Admitting: Nurse Practitioner

## 2024-12-25 ENCOUNTER — Encounter: Payer: Self-pay | Admitting: Nurse Practitioner

## 2024-12-25 VITALS — BP 150/60 | HR 51 | Ht 63.0 in | Wt 211.6 lb

## 2024-12-25 DIAGNOSIS — E785 Hyperlipidemia, unspecified: Secondary | ICD-10-CM

## 2024-12-25 DIAGNOSIS — I2510 Atherosclerotic heart disease of native coronary artery without angina pectoris: Secondary | ICD-10-CM | POA: Diagnosis not present

## 2024-12-25 DIAGNOSIS — I10 Essential (primary) hypertension: Secondary | ICD-10-CM | POA: Diagnosis not present

## 2024-12-25 DIAGNOSIS — R7989 Other specified abnormal findings of blood chemistry: Secondary | ICD-10-CM

## 2024-12-25 DIAGNOSIS — Z79899 Other long term (current) drug therapy: Secondary | ICD-10-CM

## 2024-12-25 DIAGNOSIS — I5032 Chronic diastolic (congestive) heart failure: Secondary | ICD-10-CM | POA: Diagnosis not present

## 2024-12-25 DIAGNOSIS — Z952 Presence of prosthetic heart valve: Secondary | ICD-10-CM | POA: Diagnosis not present

## 2024-12-25 MED ORDER — OMRON 3 SERIES BP MONITOR DEVI: 0 refills | Status: AC

## 2024-12-25 MED ORDER — LOSARTAN POTASSIUM 50 MG PO TABS: 50.0000 mg | ORAL_TABLET | Freq: Every day | ORAL | 6 refills | Status: AC

## 2024-12-25 NOTE — Patient Instructions (Addendum)
 Medication Instructions:   Increase Losartan  to 50mg  daily Continue all other medications.     Labwork:  CBC, CMET, FLP, Thyroid  panel with TSH Reminder:  Nothing to eat or drink after 12 midnight prior to labs. Office will contact with results via phone, letter or mychart.     Testing/Procedures:  none  Follow-Up:  2-3 months    Any Other Special Instructions Will Be Listed Below (If Applicable).  [facility] BP log - update provider via mychart in 2-3 weeks on these readings  BP monitor prescription sent to pharmacy today  CHF info given   If you need a refill on your cardiac medications before your next appointment, please call your pharmacy.

## 2024-12-25 NOTE — Progress Notes (Addendum)
 " Cardiology Office Note:  .   Date:  12/25/2024 ID:  Karina Green, DOB 1956/06/09, MRN [id number] PCP: Dow Longs, PA-C   [facility] Providers Cardiologist:  Alvan Carrier, MD    History of Present Illness: .   Karina Green is a 69 y.o. female with a PMH of CAD, s/p CABG in 2015, HFpEF, hyperlipidemia, hypertension, aortic stenosis, s/p TAVR in 2023, palpitations, and history of lung nodule, who presents today for hospital follow-up.   Last seen by Dr. Carrier Alvan on February 10, 2023.  She was overall doing well at that time.  Her EKG revealed sinus bradycardia with heart rate in the low 50s, she is asymptomatic with this.  10/26/2023 - Today she presents for scheduled 6 month follow-up. She endorses shortness of breath with exertion since August 2024, notices this when taking garbage into the house and has a little incline up her driveway. Symptoms are stable over time. During interview, patient is noted to have raspy, strong/nonproductive cough that is quite frequent in occurrence. Says this has been chronic for 20 years and is like this during the day. Denies ever having Covid. Was on Ace-inhibitor and was taken off, cough never went away. Remote hx of smoking for only 3 months. Does have positive hx of second hand tobacco exposure, says her father smoked her whole life. Denies any chest pain,  palpitations, syncope, presyncope, dizziness, orthopnea, PND, swelling or significant weight changes, acute bleeding, or claudication.  03/21/2024 -presents today for follow-up.  Doing well per her report.  Breathing is still stable.  She says her coughing has improved.  Does not check her BP at home. Denies any chest pain, palpitations, syncope, presyncope, dizziness, orthopnea, PND, swelling or significant weight changes, acute bleeding, or claudication.  Recent hospitalization last month for NSTEMI.  At that time, had been recently diagnosed with a UTI after visit to urgent care and was  started on prednisone  for episode of acute gouty arthritis of left hand.  Presented to the hospital with chest pain have been going on and off intermittently for about 2 weeks.  Typically did not occur with exertion.  Troponins elevated.  CXR revealed similar appearance of mild reticular prominence and peribronchial cuffing which may reflect mild pulmonary edema or chronic small airway disease.  Underwent left heart cath on December 26 that showed stable CAD and no change since last heart cath.  See full report below.  Echocardiogram revealed normal LVEF, grade 2 DD, mildly elevated PASP.  Normal structure and function of aortic valve prosthesis.  12/25/2024 - Here for hospital follow-up.  Doing very well since her heart cath.  Denies any acute cardiac complaints or issues.  Does not check her BP at home and does not own a BP cuff. Denies any chest pain, shortness of breath, palpitations, syncope, presyncope, dizziness, orthopnea, PND, swelling or significant weight changes, acute bleeding, or claudication. Asymptomatic in regards to her HR today.    ROS: Negative. See HPI.   Studies Reviewed: SABRA    EKG: EKG is not ordered today.   LHC 11/2024:    Dist LM lesion is 70% stenosed.   Ost Cx lesion is 50% stenosed.   Mid RCA lesion is 40% stenosed.   Origin to Prox Graft lesion is 100% stenosed.   Ost LAD to Prox LAD lesion is 90% stenosed.   Prox RCA lesion is 60% stenosed.   SVG graft was not visualized.   LIMA graft was visualized by angiography.  Stable CAD without change since last catheterization in 2023.  Moderate distal left main stenosis Severe proximal LAD stenosis. Patent LIMA to LAD Moderate ostial Circumflex stenosis. The vein graft to the OM branch is known to be occluded and was not injected.  The RCA is a large dominant vessel with moderate proximal stenosis (unchanged from last cath), mild diffuse distal disease.  LVEDP=18-22 mmHg   Continue medical management of CAD  Echo  11/2024:  1. Left ventricular ejection fraction, by estimation, is 60 to 65%. The  left ventricle has normal function. The left ventricle has no regional  wall motion abnormalities. Left ventricular diastolic parameters are  consistent with Grade II diastolic  dysfunction (pseudonormalization).   2. Right ventricular systolic function is normal. The right ventricular  size is normal. There is mildly elevated pulmonary artery systolic  pressure.   3. Left atrial size was mildly dilated.   4. The mitral valve is degenerative. No evidence of mitral valve  regurgitation. No evidence of mitral stenosis. Moderate mitral annular  calcification.   5. The aortic valve has been repaired/replaced. Aortic valve  regurgitation is not visualized. No aortic stenosis is present. There is a  29 mm Medtronic stented (TAVR) valve present in the aortic position. Echo  findings are consistent with normal  structure and function of the aortic valve prosthesis. Aortic valve area,  by VTI measures 2.12 cm. Aortic valve mean gradient measures 7.5 mmHg.  Aortic valve Vmax measures 1.78 m/s.   6. The inferior vena cava is normal in size with greater than 50%  respiratory variability, suggesting right atrial pressure of 3 mmHg.  Echo 01/2023:  1. Left ventricular ejection fraction, by estimation, is 60 to 65%. The  left ventricle has normal function. The left ventricle has no regional  wall motion abnormalities. Left ventricular diastolic parameters are  consistent with Grade II diastolic  dysfunction (pseudonormalization). The average left ventricular global  longitudinal strain is -23.8 %. The global longitudinal strain is normal.   2. Right ventricular systolic function is normal. The right ventricular  size is normal. There is normal pulmonary artery systolic pressure. The  estimated right ventricular systolic pressure is 27.2 mmHg.   3. The mitral valve is degenerative. Mild mitral valve regurgitation.    4. The aortic valve has been repaired/replaced. Aortic valve  regurgitation is not visualized. There is a 29 mm Medtronic Evolut FX TAVR  valve present in the aortic position. Aortic valve mean gradient measures  5.5 mmHg.   5. The inferior vena cava is normal in size with greater than 50%  respiratory variability, suggesting right atrial pressure of 3 mmHg.   Comparison(s): No significant change from prior study. Prior images  reviewed side by side.  CT Coronary 12/2021:  IMPRESSION: 1. Tri leaflet AV with calcium  score 1705   2.  Annular area of 400 mm2 suitable for a 23 mm Sapien 3   3. Optimum angiographic angle for deployment LAO 9 Cranial 4 degrees   4.  Normal ascending thoracic aorta 3.5 cm   5.  Occluded SVG to OM and patent LIMA to LAD   6.  Membranous Septum length 6.3 mm  Right/left heart cath 12/2021:    Prox RCA lesion is 40% stenosed.   Mid RCA lesion is 40% stenosed.   Dist LM lesion is 70% stenosed.   Ost Cx lesion is 50% stenosed.   Ost LAD to Prox LAD lesion is 75% stenosed.   Origin to Prox Graft lesion  is 100% stenosed.   1.  Moderately severe 70% distal left main stenosis 2.  Severe 75% proximal LAD stenosis 3.  Moderate 50% proximal circumflex stenosis 4.  Patent RCA with mild plaquing, dominant vessel 5. Continued patency of the LIMA-LAD 6. Chronic occlusion of the SVG-OM 7.  Severe calcific aortic stenosis with peak to peak gradient of 66 mmHg. 8.  Mildly elevated right heart pressures, suspect secondary to left heart disease   Recommendations: Medical therapy for CAD, TAVR evaluation for treatment of severe aortic stenosis.  Physical Exam:   VS:  BP (!) 150/60   Pulse (!) 51   Ht 5' 3 (1.6 m)   Wt 211 lb 9.6 oz (96 kg)   SpO2 99%   BMI 37.48 kg/m    Wt Readings from Last 3 Encounters:  12/25/24 211 lb 9.6 oz (96 kg)  12/16/24 211 lb 9.6 oz (96 kg)  03/21/24 225 lb 12.8 oz (102.4 kg)    GEN: Obese, 69 y.o. female in no acute  distress NECK: No JVD; No carotid bruits CARDIAC: S1/S2, RRR, Grade 1/6 murmur, no rubs, no gallops RESPIRATORY:  Clear and diminished to auscultation without rales, wheezing or rhonchi EXTREMITIES:  No edema; No deformity   ASSESSMENT AND PLAN: .    HFpEF Most recent echo showed normal LVEF, grade 2 DD.  Will increase losartan  to 50 mg daily.  Will obtain CBC, CMET in 1 to 2 weeks.  No other medication changes at this time and plan to titrate GDMT at next office visit as well. Low sodium diet, fluid restriction <2L, and daily weights encouraged. Educated to contact our office for weight gain of 2 lbs overnight or 5 lbs in one week.  CAD, s/p CABG Stable with no anginal symptoms. No indication for ischemic evaluation.  See most recent heart cath noted above.  No complications post cath.  No other medication changes besides what is noted above. Heart healthy diet and regular cardiovascular exercise encouraged. Care and ED precautions discussed.  Will obtain CBC, CMET and FLP.  HTN Blood pressure elevated. Discussed SBP goal is less than 140. Discussed to monitor BP at home at least 2 hours after medications and sitting for 5-10 minutes.  Given BP log and salty 6. Heart healthy diet and regular cardiovascular exercise encouraged.  Will increase losartan  to 50 mg daily and repeat labs afterwards.  She will update us  via MyChart in 2 to 3 weeks regarding her BP readings and we will give her an Rx for BP cuff.  Will obtain thyroid  panel as well.  HLD, medication management She is due for labs and will obtain FLP in addition to CMET as noted above.    Zetia  in the past was too expensive.  If most recent LDL is still not at goal, plan to discuss PCSK9i and/or possibly nexlizet. Heart healthy diet and regular cardiovascular exercise encouraged.   Aortic valve stenosis, s/p TAVR Most recent echo showed normal structure and function of TAVR.  Denies any symptoms.  SBE prophylaxis discussed.  She  verbalized understanding.  No medication changes at this time.  6.  Low TSH Noted with most recent labs and will obtain TSH and thyroid  panel as noted above.  Dispo: Follow-up with Dr. Dorn Ross or APP in 2-3 months or sooner if anything changes.  Signed, Almarie Crate, NP   "

## 2025-01-02 ENCOUNTER — Ambulatory Visit

## 2025-01-16 ENCOUNTER — Encounter: Payer: Self-pay | Admitting: *Deleted

## 2025-01-16 NOTE — Progress Notes (Signed)
 ODYSSEY VASBINDER                                          MRN: [id number]   01/16/2025   The [facility] Quality Team Specialist reviewed this patient medical record for the purposes of chart review for care gap closure. The following were reviewed: chart review for care gap closure-glycemic status assessment.    [facility] Quality Team

## 2025-01-17 ENCOUNTER — Encounter: Admitting: Nutrition

## 2025-01-25 NOTE — Progress Notes (Signed)
 Karina Green                                          MRN: [id number]   01/25/2025   The [facility] Quality Team Specialist reviewed this patient medical record for the purposes of chart review for care gap closure. The following were reviewed: chart review for care gap closure-diabetic eye exam.    [facility] Quality Team

## 2025-02-12 ENCOUNTER — Encounter: Admitting: Nutrition

## 2025-03-26 ENCOUNTER — Ambulatory Visit: Admitting: Nurse Practitioner
